# Patient Record
Sex: Male | Born: 1955 | Race: White | Hispanic: No | Marital: Married | Smoking: Former smoker
Health system: Southern US, Community
[De-identification: ages and names within clinical notes are randomized; demographics above are authoritative.]

## PROBLEM LIST (undated history)

## (undated) DIAGNOSIS — F329 Major depressive disorder, single episode, unspecified: Secondary | ICD-10-CM

## (undated) DIAGNOSIS — E785 Hyperlipidemia, unspecified: Secondary | ICD-10-CM

## (undated) DIAGNOSIS — I82403 Acute embolism and thrombosis of unspecified deep veins of lower extremity, bilateral: Secondary | ICD-10-CM

## (undated) DIAGNOSIS — C8330 Diffuse large B-cell lymphoma, unspecified site: Secondary | ICD-10-CM

## (undated) DIAGNOSIS — I10 Essential (primary) hypertension: Secondary | ICD-10-CM

## (undated) DIAGNOSIS — F419 Anxiety disorder, unspecified: Secondary | ICD-10-CM

## (undated) DIAGNOSIS — F32A Depression, unspecified: Secondary | ICD-10-CM

## (undated) DIAGNOSIS — Z95828 Presence of other vascular implants and grafts: Secondary | ICD-10-CM

---
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
## 1999-03-03 ENCOUNTER — Emergency Department (HOSPITAL_COMMUNITY): Admission: EM | Admit: 1999-03-03 | Discharge: 1999-03-03 | Payer: Self-pay | Admitting: Emergency Medicine

## 1999-03-05 ENCOUNTER — Emergency Department (HOSPITAL_COMMUNITY): Admission: EM | Admit: 1999-03-05 | Discharge: 1999-03-05 | Payer: Self-pay | Admitting: Emergency Medicine

## 1999-03-13 ENCOUNTER — Emergency Department (HOSPITAL_COMMUNITY): Admission: EM | Admit: 1999-03-13 | Discharge: 1999-03-13 | Payer: Self-pay | Admitting: Emergency Medicine

## 2004-05-25 ENCOUNTER — Ambulatory Visit: Payer: Self-pay | Admitting: Family Medicine

## 2004-07-16 ENCOUNTER — Ambulatory Visit: Payer: Self-pay | Admitting: Family Medicine

## 2004-08-27 ENCOUNTER — Ambulatory Visit: Payer: Self-pay | Admitting: Family Medicine

## 2004-12-03 ENCOUNTER — Ambulatory Visit: Payer: Self-pay | Admitting: Family Medicine

## 2005-04-29 ENCOUNTER — Ambulatory Visit: Payer: Self-pay | Admitting: Family Medicine

## 2005-09-02 ENCOUNTER — Ambulatory Visit: Payer: Self-pay | Admitting: Family Medicine

## 2006-01-06 ENCOUNTER — Ambulatory Visit: Payer: Self-pay | Admitting: Family Medicine

## 2006-06-18 ENCOUNTER — Ambulatory Visit: Payer: Self-pay | Admitting: Family Medicine

## 2006-10-21 ENCOUNTER — Ambulatory Visit: Payer: Self-pay | Admitting: Family Medicine

## 2014-02-01 DIAGNOSIS — E785 Hyperlipidemia, unspecified: Secondary | ICD-10-CM

## 2014-02-01 DIAGNOSIS — E781 Pure hyperglyceridemia: Secondary | ICD-10-CM | POA: Insufficient documentation

## 2014-02-01 HISTORY — DX: Hyperlipidemia, unspecified: E78.5

## 2014-02-28 ENCOUNTER — Telehealth: Payer: Self-pay

## 2014-02-28 NOTE — Telephone Encounter (Signed)
Pt was referred by Dr. Edrick Oh for screening colonoscopy. Letter was mailed to him on 02/02/2014. PCP called on 02/22/2014 and ask the status of referral and was told by Ginger that we had mailed him a letter and he had not responded. I just tried to call pt and could not get through to him on the phone.  I am mailing another letter to him and also sending a letter to PCP.

## 2015-02-08 ENCOUNTER — Encounter (INDEPENDENT_AMBULATORY_CARE_PROVIDER_SITE_OTHER): Payer: Self-pay | Admitting: *Deleted

## 2016-02-12 ENCOUNTER — Encounter (INDEPENDENT_AMBULATORY_CARE_PROVIDER_SITE_OTHER): Payer: Self-pay | Admitting: *Deleted

## 2016-05-21 DIAGNOSIS — L02229 Furuncle of trunk, unspecified: Secondary | ICD-10-CM | POA: Insufficient documentation

## 2017-02-03 LAB — HM HEPATITIS C SCREENING LAB: HM Hepatitis Screen: NEGATIVE

## 2017-02-14 ENCOUNTER — Encounter (INDEPENDENT_AMBULATORY_CARE_PROVIDER_SITE_OTHER): Payer: Self-pay | Admitting: *Deleted

## 2017-08-27 DIAGNOSIS — I10 Essential (primary) hypertension: Secondary | ICD-10-CM | POA: Diagnosis not present

## 2017-08-27 DIAGNOSIS — E785 Hyperlipidemia, unspecified: Secondary | ICD-10-CM | POA: Diagnosis not present

## 2017-08-27 DIAGNOSIS — Z1211 Encounter for screening for malignant neoplasm of colon: Secondary | ICD-10-CM | POA: Diagnosis not present

## 2017-08-27 DIAGNOSIS — Z23 Encounter for immunization: Secondary | ICD-10-CM | POA: Diagnosis not present

## 2018-02-12 DIAGNOSIS — Z125 Encounter for screening for malignant neoplasm of prostate: Secondary | ICD-10-CM | POA: Diagnosis not present

## 2018-02-12 DIAGNOSIS — E785 Hyperlipidemia, unspecified: Secondary | ICD-10-CM | POA: Diagnosis not present

## 2018-02-12 DIAGNOSIS — I10 Essential (primary) hypertension: Secondary | ICD-10-CM | POA: Diagnosis not present

## 2018-02-16 DIAGNOSIS — J209 Acute bronchitis, unspecified: Secondary | ICD-10-CM | POA: Diagnosis not present

## 2018-02-16 DIAGNOSIS — I10 Essential (primary) hypertension: Secondary | ICD-10-CM | POA: Diagnosis not present

## 2018-02-16 DIAGNOSIS — Z1211 Encounter for screening for malignant neoplasm of colon: Secondary | ICD-10-CM | POA: Diagnosis not present

## 2018-02-16 DIAGNOSIS — Z0000 Encounter for general adult medical examination without abnormal findings: Secondary | ICD-10-CM | POA: Diagnosis not present

## 2018-02-17 ENCOUNTER — Encounter (INDEPENDENT_AMBULATORY_CARE_PROVIDER_SITE_OTHER): Payer: Self-pay | Admitting: *Deleted

## 2019-04-17 ENCOUNTER — Emergency Department (HOSPITAL_COMMUNITY): Payer: BLUE CROSS/BLUE SHIELD

## 2019-04-17 ENCOUNTER — Other Ambulatory Visit: Payer: Self-pay

## 2019-04-17 ENCOUNTER — Encounter (HOSPITAL_COMMUNITY): Payer: Self-pay

## 2019-04-17 ENCOUNTER — Inpatient Hospital Stay (HOSPITAL_COMMUNITY): Payer: BLUE CROSS/BLUE SHIELD

## 2019-04-17 ENCOUNTER — Inpatient Hospital Stay (HOSPITAL_COMMUNITY)
Admission: EM | Admit: 2019-04-17 | Discharge: 2019-04-22 | DRG: 987 | Disposition: A | Discharge status: Home / Self Care | Payer: BLUE CROSS/BLUE SHIELD | Attending: Internal Medicine | Admitting: Internal Medicine

## 2019-04-17 DIAGNOSIS — N131 Hydronephrosis with ureteral stricture, not elsewhere classified: Secondary | ICD-10-CM | POA: Diagnosis present

## 2019-04-17 DIAGNOSIS — Z20828 Contact with and (suspected) exposure to other viral communicable diseases: Secondary | ICD-10-CM | POA: Diagnosis not present

## 2019-04-17 DIAGNOSIS — Z8042 Family history of malignant neoplasm of prostate: Secondary | ICD-10-CM | POA: Diagnosis not present

## 2019-04-17 DIAGNOSIS — R0902 Hypoxemia: Secondary | ICD-10-CM | POA: Diagnosis not present

## 2019-04-17 DIAGNOSIS — I82431 Acute embolism and thrombosis of right popliteal vein: Secondary | ICD-10-CM | POA: Diagnosis present

## 2019-04-17 DIAGNOSIS — I129 Hypertensive chronic kidney disease with stage 1 through stage 4 chronic kidney disease, or unspecified chronic kidney disease: Secondary | ICD-10-CM | POA: Diagnosis present

## 2019-04-17 DIAGNOSIS — I509 Heart failure, unspecified: Secondary | ICD-10-CM

## 2019-04-17 DIAGNOSIS — N184 Chronic kidney disease, stage 4 (severe): Secondary | ICD-10-CM | POA: Diagnosis not present

## 2019-04-17 DIAGNOSIS — I82453 Acute embolism and thrombosis of peroneal vein, bilateral: Secondary | ICD-10-CM | POA: Diagnosis present

## 2019-04-17 DIAGNOSIS — Z8249 Family history of ischemic heart disease and other diseases of the circulatory system: Secondary | ICD-10-CM

## 2019-04-17 DIAGNOSIS — I82443 Acute embolism and thrombosis of tibial vein, bilateral: Secondary | ICD-10-CM | POA: Diagnosis present

## 2019-04-17 DIAGNOSIS — C8335 Diffuse large B-cell lymphoma, lymph nodes of inguinal region and lower limb: Secondary | ICD-10-CM | POA: Diagnosis present

## 2019-04-17 DIAGNOSIS — Z79899 Other long term (current) drug therapy: Secondary | ICD-10-CM | POA: Diagnosis not present

## 2019-04-17 DIAGNOSIS — N179 Acute kidney failure, unspecified: Secondary | ICD-10-CM | POA: Diagnosis present

## 2019-04-17 DIAGNOSIS — I272 Pulmonary hypertension, unspecified: Secondary | ICD-10-CM | POA: Diagnosis present

## 2019-04-17 DIAGNOSIS — N1339 Other hydronephrosis: Secondary | ICD-10-CM | POA: Diagnosis present

## 2019-04-17 DIAGNOSIS — R590 Localized enlarged lymph nodes: Secondary | ICD-10-CM | POA: Diagnosis not present

## 2019-04-17 DIAGNOSIS — I82403 Acute embolism and thrombosis of unspecified deep veins of lower extremity, bilateral: Secondary | ICD-10-CM | POA: Diagnosis present

## 2019-04-17 DIAGNOSIS — I160 Hypertensive urgency: Secondary | ICD-10-CM

## 2019-04-17 DIAGNOSIS — I10 Essential (primary) hypertension: Secondary | ICD-10-CM | POA: Diagnosis present

## 2019-04-17 DIAGNOSIS — Z86718 Personal history of other venous thrombosis and embolism: Secondary | ICD-10-CM | POA: Diagnosis present

## 2019-04-17 DIAGNOSIS — N289 Disorder of kidney and ureter, unspecified: Secondary | ICD-10-CM | POA: Diagnosis not present

## 2019-04-17 DIAGNOSIS — Z87891 Personal history of nicotine dependence: Secondary | ICD-10-CM | POA: Diagnosis not present

## 2019-04-17 DIAGNOSIS — R05 Cough: Secondary | ICD-10-CM | POA: Diagnosis not present

## 2019-04-17 DIAGNOSIS — E7800 Pure hypercholesterolemia, unspecified: Secondary | ICD-10-CM | POA: Diagnosis not present

## 2019-04-17 DIAGNOSIS — I2699 Other pulmonary embolism without acute cor pulmonale: Secondary | ICD-10-CM | POA: Diagnosis not present

## 2019-04-17 DIAGNOSIS — R1903 Right lower quadrant abdominal swelling, mass and lump: Secondary | ICD-10-CM | POA: Diagnosis not present

## 2019-04-17 DIAGNOSIS — R319 Hematuria, unspecified: Secondary | ICD-10-CM | POA: Diagnosis not present

## 2019-04-17 DIAGNOSIS — J9601 Acute respiratory failure with hypoxia: Secondary | ICD-10-CM | POA: Diagnosis not present

## 2019-04-17 DIAGNOSIS — N1330 Unspecified hydronephrosis: Secondary | ICD-10-CM | POA: Diagnosis not present

## 2019-04-17 DIAGNOSIS — I110 Hypertensive heart disease with heart failure: Secondary | ICD-10-CM | POA: Diagnosis not present

## 2019-04-17 DIAGNOSIS — Z23 Encounter for immunization: Secondary | ICD-10-CM

## 2019-04-17 DIAGNOSIS — R591 Generalized enlarged lymph nodes: Secondary | ICD-10-CM | POA: Diagnosis not present

## 2019-04-17 DIAGNOSIS — I82411 Acute embolism and thrombosis of right femoral vein: Secondary | ICD-10-CM | POA: Diagnosis present

## 2019-04-17 DIAGNOSIS — R0602 Shortness of breath: Secondary | ICD-10-CM

## 2019-04-17 DIAGNOSIS — I82442 Acute embolism and thrombosis of left tibial vein: Secondary | ICD-10-CM | POA: Diagnosis not present

## 2019-04-17 DIAGNOSIS — Z806 Family history of leukemia: Secondary | ICD-10-CM | POA: Diagnosis not present

## 2019-04-17 DIAGNOSIS — R609 Edema, unspecified: Secondary | ICD-10-CM

## 2019-04-17 HISTORY — DX: Essential (primary) hypertension: I10

## 2019-04-17 LAB — COMPREHENSIVE METABOLIC PANEL
ALT: 14 U/L (ref 0–44)
AST: 18 U/L (ref 15–41)
Albumin: 3.7 g/dL (ref 3.5–5.0)
Alkaline Phosphatase: 89 U/L (ref 38–126)
Anion gap: 11 (ref 5–15)
BUN: 32 mg/dL — ABNORMAL HIGH (ref 8–23)
CO2: 19 mmol/L — ABNORMAL LOW (ref 22–32)
Calcium: 9.9 mg/dL (ref 8.9–10.3)
Chloride: 106 mmol/L (ref 98–111)
Creatinine, Ser: 2.19 mg/dL — ABNORMAL HIGH (ref 0.61–1.24)
GFR calc Af Amer: 36 mL/min — ABNORMAL LOW (ref >60)
GFR calc non Af Amer: 31 mL/min — ABNORMAL LOW (ref >60)
Glucose, Bld: 117 mg/dL — ABNORMAL HIGH (ref 70–99)
Potassium: 3.6 mmol/L (ref 3.5–5.1)
Sodium: 136 mmol/L (ref 135–145)
Total Bilirubin: 0.4 mg/dL (ref 0.3–1.2)
Total Protein: 7 g/dL (ref 6.5–8.1)

## 2019-04-17 LAB — PROCALCITONIN: Procalcitonin: <0.1 ng/mL

## 2019-04-17 LAB — SARS CORONAVIRUS 2 BY RT PCR (HOSPITAL ORDER, PERFORMED IN ~~LOC~~ HOSPITAL LAB): SARS Coronavirus 2: NEGATIVE

## 2019-04-17 LAB — URINALYSIS, ROUTINE W REFLEX MICROSCOPIC
Bacteria, UA: NONE SEEN
Bilirubin Urine: NEGATIVE
Glucose, UA: NEGATIVE mg/dL
Ketones, ur: NEGATIVE mg/dL
Leukocytes,Ua: NEGATIVE
Nitrite: NEGATIVE
Protein, ur: 30 mg/dL — AB
RBC / HPF: >50 RBC/hpf — ABNORMAL HIGH (ref 0–5)
Specific Gravity, Urine: 1.012 (ref 1.005–1.030)
pH: 5 (ref 5.0–8.0)

## 2019-04-17 LAB — TRIGLYCERIDES: Triglycerides: 181 mg/dL — ABNORMAL HIGH (ref <150)

## 2019-04-17 LAB — LACTIC ACID, PLASMA
Lactic Acid, Venous: 1.7 mmol/L (ref 0.5–1.9)
Lactic Acid, Venous: 1.5 mmol/L (ref 0.5–1.9)

## 2019-04-17 LAB — CBC WITH DIFFERENTIAL/PLATELET
Abs Immature Granulocytes: 0.1 10*3/uL — ABNORMAL HIGH (ref 0.00–0.07)
Basophils Absolute: 0 10*3/uL (ref 0.0–0.1)
Basophils Relative: 0 %
Eosinophils Absolute: 0.3 10*3/uL (ref 0.0–0.5)
Eosinophils Relative: 3 %
HCT: 42 % (ref 39.0–52.0)
Hemoglobin: 14.3 g/dL (ref 13.0–17.0)
Immature Granulocytes: 1 %
Lymphocytes Relative: 10 %
Lymphs Abs: 1.1 10*3/uL (ref 0.7–4.0)
MCH: 31.5 pg (ref 26.0–34.0)
MCHC: 34 g/dL (ref 30.0–36.0)
MCV: 92.5 fL (ref 80.0–100.0)
Monocytes Absolute: 1.3 10*3/uL — ABNORMAL HIGH (ref 0.1–1.0)
Monocytes Relative: 13 %
Neutro Abs: 7.7 10*3/uL (ref 1.7–7.7)
Neutrophils Relative %: 73 %
Platelets: 134 10*3/uL — ABNORMAL LOW (ref 150–400)
RBC: 4.54 MIL/uL (ref 4.22–5.81)
RDW: 12.4 % (ref 11.5–15.5)
WBC: 10.5 10*3/uL (ref 4.0–10.5)
nRBC: 0 % (ref 0.0–0.2)

## 2019-04-17 LAB — BRAIN NATRIURETIC PEPTIDE: B Natriuretic Peptide: 220 pg/mL — ABNORMAL HIGH (ref 0.0–100.0)

## 2019-04-17 LAB — C-REACTIVE PROTEIN: CRP: 10 mg/dL — ABNORMAL HIGH (ref <1.0)

## 2019-04-17 LAB — D-DIMER, QUANTITATIVE: D-Dimer, Quant: >20 ug/mL-FEU — ABNORMAL HIGH (ref 0.00–0.50)

## 2019-04-17 LAB — TROPONIN I (HIGH SENSITIVITY)
Troponin I (High Sensitivity): 45 ng/L — ABNORMAL HIGH (ref <18)
Troponin I (High Sensitivity): 55 ng/L — ABNORMAL HIGH (ref <18)

## 2019-04-17 LAB — FERRITIN: Ferritin: 318 ng/mL (ref 24–336)

## 2019-04-17 LAB — HIV ANTIBODY (ROUTINE TESTING W REFLEX): HIV Screen 4th Generation wRfx: NONREACTIVE

## 2019-04-17 LAB — ECHOCARDIOGRAM COMPLETE
Height: 66 in
Weight: 3456.81 oz

## 2019-04-17 LAB — LACTATE DEHYDROGENASE: LDH: 202 U/L — ABNORMAL HIGH (ref 98–192)

## 2019-04-17 LAB — FIBRINOGEN: Fibrinogen: 257 mg/dL (ref 210–475)

## 2019-04-17 MED ORDER — FUROSEMIDE 10 MG/ML IJ SOLN
40.0000 mg | Freq: Once | INTRAMUSCULAR | Status: AC
  Administered 2019-04-17: 40 mg via INTRAVENOUS
  Filled 2019-04-17: qty 4

## 2019-04-17 MED ORDER — SODIUM CHLORIDE 0.9 % IV SOLN: 250.0000 mL | INTRAVENOUS | Status: DC | PRN

## 2019-04-17 MED ORDER — SODIUM CHLORIDE 0.9% FLUSH
3.0000 mL | Freq: Two times a day (BID) | INTRAVENOUS | Status: DC
  Administered 2019-04-17 – 2019-04-22 (×9): 3 mL via INTRAVENOUS

## 2019-04-17 MED ORDER — SODIUM CHLORIDE 0.9% FLUSH: 3.0000 mL | INTRAVENOUS | Status: DC | PRN

## 2019-04-17 MED ORDER — AMLODIPINE BESYLATE 5 MG PO TABS
10.0000 mg | ORAL_TABLET | Freq: Every day | ORAL | Status: DC
  Administered 2019-04-17 – 2019-04-22 (×5): 10 mg via ORAL
  Filled 2019-04-17 (×6): qty 2

## 2019-04-17 MED ORDER — ALBUTEROL SULFATE HFA 108 (90 BASE) MCG/ACT IN AERS
8.0000 | INHALATION_SPRAY | Freq: Once | RESPIRATORY_TRACT | Status: AC
  Administered 2019-04-17: 8 via RESPIRATORY_TRACT
  Filled 2019-04-17: qty 6.7

## 2019-04-17 MED ORDER — ONDANSETRON HCL 4 MG/2ML IJ SOLN: 4.0000 mg | Freq: Four times a day (QID) | INTRAMUSCULAR | Status: DC | PRN

## 2019-04-17 MED ORDER — METOPROLOL TARTRATE 25 MG PO TABS
25.0000 mg | ORAL_TABLET | Freq: Two times a day (BID) | ORAL | Status: DC
  Administered 2019-04-17 – 2019-04-22 (×10): 25 mg via ORAL
  Filled 2019-04-17 (×11): qty 1

## 2019-04-17 MED ORDER — ORAL CARE MOUTH RINSE
15.0000 mL | Freq: Two times a day (BID) | OROMUCOSAL | Status: DC
  Administered 2019-04-17 – 2019-04-22 (×10): 15 mL via OROMUCOSAL

## 2019-04-17 MED ORDER — HEPARIN (PORCINE) 25000 UT/250ML-% IV SOLN
1400.0000 [IU]/h | INTRAVENOUS | Status: DC
  Administered 2019-04-17: 1400 [IU]/h via INTRAVENOUS
  Filled 2019-04-17: qty 250

## 2019-04-17 MED ORDER — NITROGLYCERIN 2 % TD OINT
0.5000 [in_us] | TOPICAL_OINTMENT | Freq: Once | TRANSDERMAL | Status: AC
  Administered 2019-04-17: 0.5 [in_us] via TOPICAL
  Filled 2019-04-17: qty 1

## 2019-04-17 MED ORDER — ONDANSETRON HCL 4 MG PO TABS: 4.0000 mg | ORAL_TABLET | Freq: Four times a day (QID) | ORAL | Status: DC | PRN

## 2019-04-17 MED ORDER — TECHNETIUM TO 99M ALBUMIN AGGREGATED
1.5000 | Freq: Once | INTRAVENOUS | Status: AC | PRN
  Administered 2019-04-17: 1.52 via INTRAVENOUS

## 2019-04-17 MED ORDER — HYDRALAZINE HCL 25 MG PO TABS
50.0000 mg | ORAL_TABLET | Freq: Four times a day (QID) | ORAL | Status: DC | PRN
  Administered 2019-04-18: 50 mg via ORAL
  Filled 2019-04-17: qty 2

## 2019-04-17 MED ORDER — HEPARIN BOLUS VIA INFUSION
4000.0000 [IU] | Freq: Once | INTRAVENOUS | Status: AC
  Administered 2019-04-17: 4000 [IU] via INTRAVENOUS
  Filled 2019-04-17: qty 4000

## 2019-04-17 MED ORDER — HEPARIN SODIUM (PORCINE) 5000 UNIT/ML IJ SOLN
5000.0000 [IU] | Freq: Three times a day (TID) | INTRAMUSCULAR | Status: DC
  Administered 2019-04-17: 5000 [IU] via SUBCUTANEOUS
  Filled 2019-04-17: qty 1

## 2019-04-17 MED ORDER — INFLUENZA VAC SPLIT QUAD 0.5 ML IM SUSY
0.5000 mL | PREFILLED_SYRINGE | INTRAMUSCULAR | Status: AC
  Administered 2019-04-18: 0.5 mL via INTRAMUSCULAR
  Filled 2019-04-17: qty 0.5

## 2019-04-17 MED ORDER — ACETAMINOPHEN 325 MG PO TABS
650.0000 mg | ORAL_TABLET | Freq: Four times a day (QID) | ORAL | Status: DC | PRN
  Administered 2019-04-18: 650 mg via ORAL
  Filled 2019-04-17: qty 2

## 2019-04-17 MED ORDER — ACETAMINOPHEN 650 MG RE SUPP: 650.0000 mg | Freq: Four times a day (QID) | RECTAL | Status: DC | PRN

## 2019-04-17 MED ORDER — TECHNETIUM TC 99M DIETHYLENETRIAME-PENTAACETIC ACID
40.0000 | Freq: Once | INTRAVENOUS | Status: AC | PRN
  Administered 2019-04-17: 41 via RESPIRATORY_TRACT

## 2019-04-17 NOTE — ED Notes (Signed)
Pt 76% on RA. Pt placed on 3L [place] and O2 now 93%.

## 2019-04-17 NOTE — ED Notes (Signed)
Patient ambulated to restroom.

## 2019-04-17 NOTE — ED Notes (Signed)
Patient returned from restroom with no oxygen at this time 02 sat 77 on room air. Dr Tomi Bamberger in room. Patient back on 02 sat at 93.

## 2019-04-17 NOTE — H&P (Signed)
TRH H&P    Patient Demographics:    Kevin [name], is a 63 y.o. male  MRN: [id number]  DOB - 01/20/1956  Admit Date - 04/17/2019  Referring MD/NP/PA: Dr. Tomi Bamberger  Outpatient Primary MD for the patient is Dione Housekeeper, MD  Patient coming from: Home  Chief complaint-shortness of breath   HPI:    Kevin [name]  is a 63 y.o. male, with history of hypertension, hypercholesterolemia who came to hospital with worsening shortness of breath of 1 day duration.  Patient says that this morning he had some shortness of breath which got better and at 2 AM it became worse.  Patient says that he has hypertension and has been prescribed 3 medications, of which he is has been only taking 2 medications as he ran out of third 1 2 months ago.  He denies history of CHF, CAD.  No history of stroke or seizures. He denies fever or chills. Denies coughing up yellow phlegm. In the ED patient's initial blood pressure was 201/76, O2 sats was 82% on room air.  On 3 L of oxygen pulse ox was 96%. Chest x-ray was suspicious for multifocal pneumonia, however procalcitonin less than 0.10 BNP 220, D-dimer elevated greater than 20.0, creatinine 2.12, BUN 32.  Patient's baseline creatinine is around 0.68 as of January 2020. SARS-CoV-2 is negative Lasix 40 mg IV given in the ED.   Review of systems:    In addition to the HPI above,    All other systems reviewed and are negative.    Past History of the following :    History reviewed. No pertinent past medical history.    History reviewed. No pertinent surgical history.    Social History:      Social History   Tobacco Use  . Smoking status: Never Smoker  . Smokeless tobacco: Never Used  Substance Use Topics  . Alcohol use: Yes       Family History :   Patient's mother had CAD   Home Medications:   Prior to Admission medications   Not on File     Allergies:    No Known Allergies   Physical Exam:   Vitals  Blood pressure (!) 171/71, pulse 94, temperature 98 F (36.7 C), temperature source Oral, resp. rate (!) 23, height 5\' 6"  (1.676 m), weight 95.3 kg, SpO2 (!) 77 %.  1.  General: Appears in no acute distress  2. Psychiatric: Alert, oriented x3, intact insight and judgment  3. Neurologic: Cranial nerves II through XII grossly intact, motor strength 5/5 in all extremities  4. HEENMT:  Atraumatic normocephalic, extraocular muscles are intact  5. Respiratory : Decreased breath sounds at lung bases  6. Cardiovascular : S1-S2, regular, no murmur auscultated, trace edema in the lower extremities  7. Gastrointestinal:  Abdomen is soft, nontender, no organomegaly      Data Review:    CBC Recent Labs  Lab 04/17/19 0349  WBC 10.5  HGB 14.3  HCT 42.0  PLT 134*  MCV 92.5  MCH 31.5  MCHC 34.0  RDW 12.4  LYMPHSABS 1.1  MONOABS 1.3*  EOSABS 0.3  BASOSABS 0.0   ------------------------------------------------------------------------------------------------------------------  Results for orders placed or performed during the hospital encounter of 04/17/19 (from the past 48 hour(s))  SARS Coronavirus 2 by RT PCR (hospital order, performed in [facility] hospital lab) Nasopharyngeal Nasopharyngeal Swab     Status: None   Collection Time: 04/17/19  3:49 AM   Specimen: Nasopharyngeal Swab  Result Value Ref Range   SARS Coronavirus 2 NEGATIVE NEGATIVE    Comment: (NOTE) If result is NEGATIVE SARS-CoV-2 target nucleic acids are NOT DETECTED. The SARS-CoV-2 RNA is generally detectable in upper and lower  respiratory specimens during the acute phase of infection. The lowest  concentration of SARS-CoV-2 viral copies this assay can detect is 250  copies / mL. A negative result does not preclude SARS-CoV-2 infection  and should not be used as the sole basis for treatment or other  patient management decisions.  A negative result may  occur with  improper specimen collection / handling, submission of specimen other  than nasopharyngeal swab, presence of viral mutation(s) within the  areas targeted by this assay, and inadequate number of viral copies  (<250 copies / mL). A negative result must be combined with clinical  observations, patient history, and epidemiological information. If result is POSITIVE SARS-CoV-2 target nucleic acids are DETECTED. The SARS-CoV-2 RNA is generally detectable in upper and lower  respiratory specimens dur ing the acute phase of infection.  Positive  results are indicative of active infection with SARS-CoV-2.  Clinical  correlation with patient history and other diagnostic information is  necessary to determine patient infection status.  Positive results do  not rule out bacterial infection or co-infection with other viruses. If result is PRESUMPTIVE POSTIVE SARS-CoV-2 nucleic acids MAY BE PRESENT.   A presumptive positive result was obtained on the submitted specimen  and confirmed on repeat testing.  While 2019 novel coronavirus  (SARS-CoV-2) nucleic acids may be present in the submitted sample  additional confirmatory testing may be necessary for epidemiological  and / or clinical management purposes  to differentiate between  SARS-CoV-2 and other Sarbecovirus currently known to infect humans.  If clinically indicated additional testing with an alternate test  methodology [id number]) is advised. The SARS-CoV-2 RNA is generally  detectable in upper and lower respiratory sp ecimens during the acute  phase of infection. The expected result is Negative. Fact Sheet for Patients:  StrictlyIdeas.no Fact Sheet for Healthcare Providers: BankingDealers.co.za This test is not yet approved or cleared by the [place] FDA and has been authorized for detection and/or diagnosis of SARS-CoV-2 by FDA under an Emergency Use Authorization (EUA).  This  EUA will remain in effect (meaning this test can be used) for the duration of the COVID-19 declaration under Section 564(b)(1) of the Act, 21 U.S.C. section 360bbb-3(b)(1), unless the authorization is terminated or revoked sooner. Performed at [facility], [address] [place], [place] [address]   CBC WITH DIFFERENTIAL     Status: Abnormal   Collection Time: 04/17/19  3:49 AM  Result Value Ref Range   WBC 10.5 4.0 - 10.5 K/uL   RBC 4.54 4.22 - 5.81 MIL/uL   Hemoglobin 14.3 13.0 - 17.0 g/dL   HCT 42.0 39.0 - 52.0 %   MCV 92.5 80.0 - 100.0 fL   MCH 31.5 26.0 - 34.0 pg   MCHC 34.0 30.0 - 36.0 g/dL   RDW 12.4 11.5 - 15.5 %   Platelets 134 (L)  150 - 400 K/uL   nRBC 0.0 0.0 - 0.2 %   Neutrophils Relative % 73 %   Neutro Abs 7.7 1.7 - 7.7 K/uL   Lymphocytes Relative 10 %   Lymphs Abs 1.1 0.7 - 4.0 K/uL   Monocytes Relative 13 %   Monocytes Absolute 1.3 (H) 0.1 - 1.0 K/uL   Eosinophils Relative 3 %   Eosinophils Absolute 0.3 0.0 - 0.5 K/uL   Basophils Relative 0 %   Basophils Absolute 0.0 0.0 - 0.1 K/uL   Immature Granulocytes 1 %   Abs Immature Granulocytes 0.10 (H) 0.00 - 0.07 K/uL    Comment: Performed at [facility], [address] [place], [place] [address]  Comprehensive metabolic panel     Status: Abnormal   Collection Time: 04/17/19  3:49 AM  Result Value Ref Range   Sodium 136 135 - 145 mmol/L   Potassium 3.6 3.5 - 5.1 mmol/L   Chloride 106 98 - 111 mmol/L   CO2 19 (L) 22 - 32 mmol/L   Glucose, Bld 117 (H) 70 - 99 mg/dL   BUN 32 (H) 8 - 23 mg/dL   Creatinine, Ser 2.19 (H) 0.61 - 1.24 mg/dL   Calcium 9.9 8.9 - 10.3 mg/dL   Total Protein 7.0 6.5 - 8.1 g/dL   Albumin 3.7 3.5 - 5.0 g/dL   AST 18 15 - 41 U/L   ALT 14 0 - 44 U/L   Alkaline Phosphatase 89 38 - 126 U/L   Total Bilirubin 0.4 0.3 - 1.2 mg/dL   GFR calc non Af Amer 31 (L) >60 mL/min   GFR calc Af Amer 36 (L) >60 mL/min   Anion gap 11 5 - 15    Comment: Performed at [facility], [address],  [place], [place] [address]  D-dimer, quantitative     Status: Abnormal   Collection Time: 04/17/19  3:49 AM  Result Value Ref Range   D-Dimer, Quant >20.00 (H) 0.00 - 0.50 ug/mL-FEU    Comment: (NOTE) At the manufacturer cut-off of 0.50 ug/mL FEU, this assay has been documented to exclude PE with a sensitivity and negative predictive value of 97 to 99%.  At this time, this assay has not been approved by the FDA to exclude DVT/VTE. Results should be correlated with clinical presentation. Performed at [facility], [address] [place], [place] [address]   Procalcitonin     Status: None   Collection Time: 04/17/19  3:49 AM  Result Value Ref Range   Procalcitonin <0.10 ng/mL    Comment:        Interpretation: PCT (Procalcitonin) <= 0.5 ng/mL: Systemic infection (sepsis) is not likely. Local bacterial infection is possible. (NOTE)       Sepsis PCT Algorithm           Lower Respiratory Tract                                      Infection PCT Algorithm    ----------------------------     ----------------------------         PCT < 0.25 ng/mL                PCT < 0.10 ng/mL         Strongly encourage             Strongly discourage   discontinuation of antibiotics    initiation of  antibiotics    ----------------------------     -----------------------------       PCT 0.25 - 0.50 ng/mL            PCT 0.10 - 0.25 ng/mL               OR       >80% decrease in PCT            Discourage initiation of                                            antibiotics      Encourage discontinuation           of antibiotics    ----------------------------     -----------------------------         PCT >= 0.50 ng/mL              PCT 0.26 - 0.50 ng/mL               AND        <80% decrease in PCT             Encourage initiation of                                             antibiotics       Encourage continuation           of antibiotics    ----------------------------     -----------------------------         PCT >= 0.50 ng/mL                  PCT > 0.50 ng/mL               AND         increase in PCT                  Strongly encourage                                      initiation of antibiotics    Strongly encourage escalation           of antibiotics                                     -----------------------------                                           PCT <= 0.25 ng/mL                                                 OR                                        >  80% decrease in PCT                                     Discontinue / Do not initiate                                             antibiotics Performed at [facility], [address], [place], [place] [address]   Lactate dehydrogenase     Status: Abnormal   Collection Time: 04/17/19  3:49 AM  Result Value Ref Range   LDH 202 (H) 98 - 192 U/L    Comment: Performed at [facility], [address], [place], [place] [address]  Ferritin     Status: None   Collection Time: 04/17/19  3:49 AM  Result Value Ref Range   Ferritin 318 24 - 336 ng/mL    Comment: Performed at [facility], [address] [place], [place] [address]  Triglycerides     Status: Abnormal   Collection Time: 04/17/19  3:49 AM  Result Value Ref Range   Triglycerides 181 (H) <150 mg/dL    Comment: Performed at [facility], [address] [place], [place] [address]  Fibrinogen     Status: None   Collection Time: 04/17/19  3:49 AM  Result Value Ref Range   Fibrinogen 257 210 - 475 mg/dL    Comment: Performed at [facility], [address], [place], [place] [address]  C-reactive protein     Status: Abnormal   Collection Time: 04/17/19  3:49 AM  Result Value Ref Range   CRP 10.0 (H) <1.0 mg/dL    Comment: Performed at [facility], [address] [place], [place] [address]  Brain natriuretic peptide     Status: Abnormal   Collection Time: 04/17/19  3:49 AM  Result Value Ref Range   B Natriuretic Peptide 220.0 (H) 0.0 - 100.0 pg/mL    Comment: Performed  at [facility], [address] [place], [place] [address]  Troponin I (High Sensitivity)     Status: Abnormal   Collection Time: 04/17/19  3:49 AM  Result Value Ref Range   Troponin I (High Sensitivity) 45 (H) <18 ng/L    Comment: (NOTE) Elevated high sensitivity troponin I (hsTnI) values and significant  changes across serial measurements may suggest ACS but many other  chronic and acute conditions are known to elevate hsTnI results.  Refer to the "Links" section for chest pain algorithms and additional  guidance. Performed at [facility], [address], [place], [place] [address]   Lactic acid, plasma     Status: None   Collection Time: 04/17/19  4:20 AM  Result Value Ref Range   Lactic Acid, Venous 1.7 0.5 - 1.9 mmol/L    Comment: Performed at [facility], [address], [place], [place] [address]  Blood Culture (routine x 2)     Status: None (Preliminary result)   Collection Time: 04/17/19  4:20 AM   Specimen: Right Antecubital; Blood  Result Value Ref Range   Specimen Description RIGHT ANTECUBITAL    Special Requests      BOTTLES DRAWN AEROBIC AND ANAEROBIC Blood Culture adequate volume Performed at [facility], [address], [place],  [address]    Culture PENDING    Report Status PENDING   Blood  Culture (routine x 2)     Status: None (Preliminary result)   Collection Time: 04/17/19  4:30 AM   Specimen: Left Antecubital; Blood  Result Value Ref Range   Specimen Description LEFT ANTECUBITAL    Special Requests      BOTTLES DRAWN AEROBIC AND ANAEROBIC Blood Culture adequate volume Performed at [facility], [address] [place], [place] [address]    Culture PENDING    Report Status PENDING     Chemistries  Recent Labs  Lab 04/17/19 0349  NA 136  K 3.6  CL 106  CO2 19*  GLUCOSE 117*  BUN 32*  CREATININE 2.19*  CALCIUM 9.9  AST 18  ALT 14  ALKPHOS 89  BILITOT 0.4    ------------------------------------------------------------------------------------------------------------------  ------------------------------------------------------------------------------------------------------------------ GFR: Estimated Creatinine Clearance: 36.8 mL/min (A) (by C-G formula based on SCr of 2.19 mg/dL (H)). Liver Function Tests: Recent Labs  Lab 04/17/19 0349  AST 18  ALT 14  ALKPHOS 89  BILITOT 0.4  PROT 7.0  ALBUMIN 3.7   No results for input(s): LIPASE, AMYLASE in the last 168 hours. No results for input(s): AMMONIA in the last 168 hours. Coagulation Profile: No results for input(s): INR, PROTIME in the last 168 hours. Cardiac Enzymes: No results for input(s): CKTOTAL, CKMB, CKMBINDEX, TROPONINI in the last 168 hours. BNP (last 3 results) No results for input(s): PROBNP in the last 8760 hours. HbA1C: No results for input(s): HGBA1C in the last 72 hours. CBG: No results for input(s): GLUCAP in the last 168 hours. Lipid Profile: Recent Labs    04/17/19 0349  TRIG 181*   Thyroid Function Tests: No results for input(s): TSH, T4TOTAL, FREET4, T3FREE, THYROIDAB in the last 72 hours. Anemia Panel: Recent Labs    04/17/19 0349  FERRITIN 318    --------------------------------------------------------------------------------------------------------------- Urine analysis: No results found for: COLORURINE, APPEARANCEUR, LABSPEC, PHURINE, GLUCOSEU, HGBUR, BILIRUBINUR, KETONESUR, PROTEINUR, UROBILINOGEN, NITRITE, LEUKOCYTESUR    Imaging Results:    Dg Chest Port 1 View  Result Date: 04/17/2019 CLINICAL DATA:  Shortness of breath beginning yesterday. Mild productive cough. No known fevers. EXAM: PORTABLE CHEST 1 VIEW COMPARISON:  None. FINDINGS: Borderline enlarged cardiac silhouette and mediastinal contours, potentially accentuated due to slightly reduced lung volumes and AP projection. Ill defined heterogeneous opacities are seen within the right  upper and left lower lungs. Mild pulmonary venous congestion without frank evidence of edema. No pleural effusion or pneumothorax. No acute osseous abnormalities. IMPRESSION: 1. Potential developing airspace opacities within the right upper and left lower lung, potentially atelectasis though could be seen in the setting of multifocal infection. Clinical correlation is advised. Further evaluation with a PA and lateral chest radiograph may be obtained as clinically indicated. 2. Pulmonary venous congestion without frank evidence of edema. Electronically Signed   By: Sandi Mariscal M.D.   On: 04/17/2019 04:22    My personal review of EKG: Rhythm NSR, no ST changes   Assessment & Plan:    Active Problems:   Hypertensive urgency   1. Hypertensive urgency-patient presenting with hypertensive urgency, dyspnea on exertion, likely has pulmonary edema on chest x-ray.  He is requiring 3 L/min of oxygen via nasal cannula.  BNP is 220.  We will give Lasix 40 mg IV x1.  Patient has creatinine of 2.12, if creatinine improves with Lasix consider continuing with Lasix.  Will obtain echocardiogram to rule out diastolic heart failure.  Troponin x2 is negative.  2. Elevated D-dimer-patient has significant elevated D-dimer greater than 20.0.  Cannot  obtain CTA chest due to renal insufficiency as above.  Will obtain VQ scan.  COVID-19 test is negative.  3. Hypertension-patient was taking metoprolol succinate 50 mg daily, amlodipine 10 mg daily, Cozaar 100 mg daily.  He ran out of one of the medications for past 2 months.  He does not member which medication he ran out.  We will continue with amlodipine 10 mg daily, metoprolol 25 mg p.o. twice daily.  4. Acute kidney injury-patient's creatinine 2.12, patient creatinine around 0.68.  Patient given 1 dose of Lasix 40 mg IV as above.  Follow creatinine closely.   DVT Prophylaxis-Heparin  AM Labs Ordered, also please review Full Orders  Family Communication: Admission,  patients condition and plan of care including tests being ordered have been discussed with the patient  who indicate understanding and agree with the plan and Code Status.  Code Status: Full code  Admission status: Inpatient: Based on patients clinical presentation and evaluation of above clinical data, I have made determination that patient meets Inpatient criteria at this time.  Time spent in minutes : 60 minutes   Oswald Hillock M.D on 04/17/2019 at 6:02 AM

## 2019-04-17 NOTE — Progress Notes (Signed)
ANTICOAGULATION CONSULT NOTE - Initial Consult  Pharmacy Consult for Heparin Indication: VTE treatment  No Known Allergies  Patient Measurements: Height: 5\' 6"  (167.6 cm) Weight: 216 lb 0.8 oz (98 kg) IBW/kg (Calculated) : 63.8 HEPARIN DW (KG): 85.2  Vital Signs: Temp: 99.2 F (37.3 C) (10/17 0804) Temp Source: Oral (10/17 0804) BP: 164/80 (10/17 1119) Pulse Rate: 73 (10/17 1119)  Labs: Recent Labs    04/17/19 0349 04/17/19 0544  HGB 14.3  --   HCT 42.0  --   PLT 134*  --   CREATININE 2.19*  --   TROPONINIHS 45* 55*    Estimated Creatinine Clearance: 37.4 mL/min (A) (by C-G formula based on SCr of 2.19 mg/dL (H)).   Medical History: Past Medical History:  Diagnosis Date  . Hypertension     Medications:  Medications Prior to Admission  Medication Sig Dispense Refill Last Dose  . amLODipine (NORVASC) 10 MG tablet Take 10 mg by mouth daily.   04/16/2019 at Unknown time  . metoprolol succinate (TOPROL-XL) 50 MG 24 hr tablet Take 50 mg by mouth daily.   04/16/2019 at 0800    Assessment: Patient presented with SOB. Patient has significant elevated D-dimer . VQ scan negative for PE. MD concerned for VTE and asked to start heparin.  Goal of Therapy:  Heparin level 0.3-0.7 units/ml Monitor platelets by anticoagulation protocol: Yes   Plan:  Give 4000 units bolus x 1 Start heparin infusion at 1400 units/hr Check anti-Xa level in ~6-8 hours and daily while on heparin Continue to monitor H&H and platelets  Isac Sarna, BS Vena [place], BCPS Clinical Pharmacist Pager [phone] 04/17/2019,4:55 PM

## 2019-04-17 NOTE — Progress Notes (Signed)
*  PRELIMINARY RESULTS* Echocardiogram 2D Echocardiogram has been performed.  Leavy Cella 04/17/2019, 10:05 AM

## 2019-04-17 NOTE — ED Triage Notes (Signed)
Pt reports onset of sob yesterday, denies pain, reports mild productive cough, no known fevers.

## 2019-04-17 NOTE — ED Provider Notes (Signed)
[facility] Provider Note   CSN: [id number] Arrival date & time: 04/17/19  6144   Time seen 4:00 AM  History   Chief Complaint Chief Complaint  Patient presents with  . Shortness of Breath    HPI Kevin [name] is a 63 y.o. male.     HPI patient states yesterday, October 16 in the morning he had some trouble breathing and then it got better.  He states about 2 AM this morning it returned.  He has had a mild cough, but he denies chest pain, fever, sore throat, rhinorrhea.  He still has his taste and smell intact.  He states he has some swelling of his legs is chronic.  He states he has not smoked since 2000 but he used to smoke 2 to 3 packs/day.  He denies any family history of coronary artery disease.  He is being treated with hypertension on 3 different blood pressure pills but he ran out of one a couple months ago.  He does not know the name of any of his medications.  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  He denies any exposure to anybody who is sick. PCP Dione Housekeeper, MD   History reviewed. No pertinent past medical history.  There are no active problems to display for this patient.   History reviewed. No pertinent surgical history.      Home Medications    Review of care everywhere shows patient was on amlodipine 10 mg tablets daily, losartan 100 mg tablets daily, and metoprolol 50 mg XL daily.  Prior to Admission medications   Not on File    Family History No family history on file.  Social History Social History   Tobacco Use  . Smoking status: Never Smoker  . Smokeless tobacco: Never Used  Substance Use Topics  . Alcohol use: Yes  . Drug use: Never  lives with spouse Quit smoking 2000  Allergies   Patient has no known allergies.   Review of Systems Review of Systems  All other systems reviewed and are negative.    Physical Exam  Updated Vital Signs BP (!) 201/76 (BP Location: Left Arm)   Pulse 82   Temp 98 F (36.7 C) (Oral)   Resp (!) 22   Ht 5\' 6"  (1.676 m)   Wt 95.3 kg   SpO2 (!) 82%   BMI 33.89 kg/m   Vital signs normal except for hypertension   Physical Exam Vitals signs and nursing note reviewed.  Constitutional:      General: He is not in acute distress.    Appearance: Normal appearance. He is well-developed. He is not ill-appearing or toxic-appearing.  HENT:     Head: Normocephalic and atraumatic.     Right Ear: External ear normal.     Left Ear: External ear normal.     Nose: Nose normal. No mucosal edema or rhinorrhea.     Mouth/Throat:     Mouth: Mucous membranes are dry.     Dentition: No dental abscesses.     Pharynx: Oropharynx is clear. No oropharyngeal exudate, posterior oropharyngeal erythema or uvula swelling.  Eyes:     Extraocular Movements: Extraocular movements intact.     Conjunctiva/sclera: Conjunctivae normal.     Pupils: Pupils are equal, round, and reactive to light.  Neck:     Musculoskeletal: Full passive range of motion without pain, normal  range of motion and neck supple.  Cardiovascular:     Rate and Rhythm: Normal rate and regular rhythm.     Heart sounds: Normal heart sounds. No murmur. No friction rub. No gallop.   Pulmonary:     Effort: Tachypnea and respiratory distress present.     Breath sounds: Decreased air movement present. Examination of the right-upper field reveals rales. Examination of the left-upper field reveals rales. Examination of the left-lower field reveals rales. Rales present. No wheezing or rhonchi.  Chest:     Chest wall: No tenderness or crepitus.  Abdominal:     General: Bowel sounds are normal. There is no distension.     Palpations: Abdomen is soft.     Tenderness: There is no abdominal tenderness. There is no guarding or rebound.  Musculoskeletal: Normal range of motion.        General: No tenderness.     Comments: Moves all  extremities well.   Skin:    General: Skin is warm and dry.     Coloration: Skin is not pale.     Findings: No erythema or rash.  Neurological:     General: No focal deficit present.     Mental Status: He is alert and oriented to person, place, and time.     Cranial Nerves: No cranial nerve deficit.  Psychiatric:        Mood and Affect: Mood normal. Mood is not anxious.        Speech: Speech normal.        Behavior: Behavior normal.        Thought Content: Thought content normal.      ED Treatments / Results  Labs (all labs ordered are listed, but only abnormal results are displayed) Results for orders placed or performed during the hospital encounter of 04/17/19  SARS Coronavirus 2 by RT PCR (hospital order, performed in [place] hospital lab) Nasopharyngeal Nasopharyngeal Swab   Specimen: Nasopharyngeal Swab  Result Value Ref Range   SARS Coronavirus 2 NEGATIVE NEGATIVE  Blood Culture (routine x 2)   Specimen: Right Antecubital; Blood  Result Value Ref Range   Specimen Description RIGHT ANTECUBITAL    Special Requests      BOTTLES DRAWN AEROBIC AND ANAEROBIC Blood Culture adequate volume Performed at [facility], [address], [place], [place] [address]    Culture PENDING    Report Status PENDING   Blood Culture (routine x 2)   Specimen: Left Antecubital; Blood  Result Value Ref Range   Specimen Description LEFT ANTECUBITAL    Special Requests      BOTTLES DRAWN AEROBIC AND ANAEROBIC Blood Culture adequate volume Performed at [facility], [address] [place], [place] [address]    Culture PENDING    Report Status PENDING   Lactic acid, plasma  Result Value Ref Range   Lactic Acid, Venous 1.7 0.5 - 1.9 mmol/L  CBC WITH DIFFERENTIAL  Result Value Ref Range   WBC 10.5 4.0 - 10.5 K/uL   RBC 4.54 4.22 - 5.81 MIL/uL   Hemoglobin 14.3 13.0 - 17.0 g/dL   HCT 42.0 39.0 - 52.0 %   MCV 92.5 80.0 - 100.0 fL   MCH 31.5 26.0 - 34.0 pg   MCHC 34.0 30.0 - 36.0 g/dL    RDW 12.4 11.5 - 15.5 %   Platelets 134 (L) 150 - 400 K/uL   nRBC 0.0 0.0 - 0.2 %   Neutrophils Relative % 73 %   Neutro Abs  7.7 1.7 - 7.7 K/uL   Lymphocytes Relative 10 %   Lymphs Abs 1.1 0.7 - 4.0 K/uL   Monocytes Relative 13 %   Monocytes Absolute 1.3 (H) 0.1 - 1.0 K/uL   Eosinophils Relative 3 %   Eosinophils Absolute 0.3 0.0 - 0.5 K/uL   Basophils Relative 0 %   Basophils Absolute 0.0 0.0 - 0.1 K/uL   Immature Granulocytes 1 %   Abs Immature Granulocytes 0.10 (H) 0.00 - 0.07 K/uL  Comprehensive metabolic panel  Result Value Ref Range   Sodium 136 135 - 145 mmol/L   Potassium 3.6 3.5 - 5.1 mmol/L   Chloride 106 98 - 111 mmol/L   CO2 19 (L) 22 - 32 mmol/L   Glucose, Bld 117 (H) 70 - 99 mg/dL   BUN 32 (H) 8 - 23 mg/dL   Creatinine, Ser 2.19 (H) 0.61 - 1.24 mg/dL   Calcium 9.9 8.9 - 10.3 mg/dL   Total Protein 7.0 6.5 - 8.1 g/dL   Albumin 3.7 3.5 - 5.0 g/dL   AST 18 15 - 41 U/L   ALT 14 0 - 44 U/L   Alkaline Phosphatase 89 38 - 126 U/L   Total Bilirubin 0.4 0.3 - 1.2 mg/dL   GFR calc non Af Amer 31 (L) >60 mL/min   GFR calc Af Amer 36 (L) >60 mL/min   Anion gap 11 5 - 15  D-dimer, quantitative  Result Value Ref Range   D-Dimer, Quant >20.00 (H) 0.00 - 0.50 ug/mL-FEU  Procalcitonin  Result Value Ref Range   Procalcitonin <0.10 ng/mL  Lactate dehydrogenase  Result Value Ref Range   LDH 202 (H) 98 - 192 U/L  Ferritin  Result Value Ref Range   Ferritin 318 24 - 336 ng/mL  Triglycerides  Result Value Ref Range   Triglycerides 181 (H) <150 mg/dL  Fibrinogen  Result Value Ref Range   Fibrinogen 257 210 - 475 mg/dL  C-reactive protein  Result Value Ref Range   CRP 10.0 (H) <1.0 mg/dL  Brain natriuretic peptide  Result Value Ref Range   B Natriuretic Peptide 220.0 (H) 0.0 - 100.0 pg/mL  Troponin I (High Sensitivity)  Result Value Ref Range   Troponin I (High Sensitivity) 45 (H) <18 ng/L   Laboratory interpretation all normal except new renal insufficiency, very  elevated D-dimer, elevation of triglycerides and LDH and CRP, minimal elevation of BNP and troponin, delta troponin pending.    EKG EKG Interpretation  Date/Time:  Saturday April 17 2019 03:55:08 EDT Ventricular Rate:  89 PR Interval:    QRS Duration: 97 QT Interval:  373 QTC Calculation: 850 R Axis:   12 Text Interpretation:  Sinus rhythm Prolonged PR interval Baseline wander Otherwise within normal limits No old tracing to compare Confirmed by Rolland Porter [phone]) on 04/17/2019 4:19:22 AM   Radiology Dg Chest Port 1 View  Result Date: 04/17/2019 CLINICAL DATA:  Shortness of breath beginning yesterday. Mild productive cough. No known fevers. EXAM: PORTABLE CHEST 1 VIEW COMPARISON:  None. FINDINGS: Borderline enlarged cardiac silhouette and mediastinal contours, potentially accentuated due to slightly reduced lung volumes and AP projection. Ill defined heterogeneous opacities are seen within the right upper and left lower lungs. Mild pulmonary venous congestion without frank evidence of edema. No pleural effusion or pneumothorax. No acute osseous abnormalities. IMPRESSION: 1. Potential developing airspace opacities within the right upper and left lower lung, potentially atelectasis though could be seen in the setting of multifocal infection. Clinical correlation is advised.  Further evaluation with a PA and lateral chest radiograph may be obtained as clinically indicated. 2. Pulmonary venous congestion without frank evidence of edema. Electronically Signed   By: Sandi Mariscal M.D.   On: 04/17/2019 04:22    Procedures .Critical Care Performed by: Rolland Porter, MD Authorized by: Rolland Porter, MD   Critical care provider statement:    Critical care time (minutes):  36   Critical care was necessary to treat or prevent imminent or life-threatening deterioration of the following conditions:  Respiratory failure   Critical care was time spent personally by me on the following activities:   Discussions with consultants, examination of patient, obtaining history from patient or surrogate, ordering and review of laboratory studies, ordering and review of radiographic studies, pulse oximetry, re-evaluation of patient's condition and review of old charts   (including critical care time)  Medications Ordered in ED Medications  furosemide (LASIX) injection 40 mg (has no administration in time range)  albuterol (VENTOLIN HFA) 108 (90 Base) MCG/ACT inhaler 8 puff (8 puffs Inhalation Given 04/17/19 0456)     Initial Impression / Assessment and Plan / ED Course  I have reviewed the triage vital signs and the nursing notes.  Pertinent labs & imaging results that were available during my care of the patient were reviewed by me and considered in my medical decision making (see chart for details).    Review of care everywhere shows patient was on amlodipine 10 mg tablets daily, losartan 100 mg tablets daily, and metoprolol 50 mg XL daily.    Patient's initial pulse ox on room air was 82%.  He was placed on nasal cannula 2 L/min and his pulse ox was 92% during my exam.  On 3 L his pulse ox was 96%.  Patient has scattered rhonchi throughout his lungs, extremely suspicious for pneumonia, less likely congestive heart failure.  Testing was done.  I reviewed patient's laboratory test results.  His D-dimer is very high however his creatinine and GFR exclude him from getting a CTA done.  When I review his laboratory testing from [place] in February his creatinine was normal at that time, at 0.68 with BUN of 11.  His creatinines over the past 3 years have all been in that range or the highest was 0.86.  5:35 AM Dr. Darrick Meigs will admit.  He plans on doing an echocardiogram and a VQ scan.  Patient's blood pressure has improved to 171/71 without specific treatment.  He wants me to give him low-dose Lasix and half inch of Nitropaste.  5:37 AM I went to see the patient, he was in the bathroom without  oxygen.  He ambulated back to his bed and states he was feeling a little bit better.  His initial pulse ox was 77% on room air.  He was placed back on his nasal cannula oxygen.  We discussed his test results and he understands the need for admission.  Kevin [name] was evaluated in Emergency Department on 04/17/2019 for the symptoms described in the history of present illness. He was evaluated in the context of the global COVID-19 pandemic, which necessitated consideration that the patient might be at risk for infection with the SARS-CoV-2 virus that causes COVID-19. Institutional protocols and algorithms that pertain to the evaluation of patients at risk for COVID-19 are in a state of rapid change based on information released by regulatory bodies including the CDC and federal and state organizations. These policies and algorithms were followed during the patient's care  in the ED.   Final Clinical Impressions(s) / ED Diagnoses   Final diagnoses:  Hypoxia  Hypertensive urgency  Acute congestive heart failure, unspecified heart failure type [facility])    Plan admission  Rolland Porter, MD, Barbette Or, MD 04/17/19 [phone]

## 2019-04-17 NOTE — Progress Notes (Signed)
Patient admitted to the hospital earlier this morning by Dr. Darrick Meigs.  Patient seen and examined.  Overall he feels that breathing has improved since admission.  On exam, he is clear lungs bilaterally, 1+ bilateral lower extremity edema.  Assessment/plan:  1. Acute respiratory failure with hypoxia.  Etiology is not entirely clear.  Still requiring supplemental oxygen at 2 L.  Initial chest x-ray showed possible pulmonary vascular congestion with areas of atelectasis/infiltrates indicating multifocal pneumonia.  Procalcitonin was negative making bacterial process less likely.  D-dimer markedly elevated greater than 20.  VQ scan performed today was found to be low probability for pulmonary embolus.  He does have a mildly elevated BNP in the 200 range.  Mildly elevated troponin in the 40s.  Echocardiogram performed today shows normal ejection fraction, but markedly elevated pulmonary pressures in the 70s.  Considering his clinical picture, with elevated d-dimer, pulmonary pressures, BNP/troponin, there is concern that he may have an underlying thromboembolism.  We will start the patient on empiric heparin.  CT angiogram of the chest can be considered if renal function improves.  Will request pulmonology input. 2. Pulmonary hypertension.  Unclear if this this is related to underlying VTE.  Will request pulmonology input. 3. Acute kidney injury.  Unclear etiology.  Baseline creatinine around 0.6 on 08/2018.  Creatinine on admission 2.1.  He did receive 1 dose of Lasix today.  Will hold off on prescribing further doses until we follow renal function trend.  Check urinalysis and renal ultrasound.  He was reportedly taking Cozaar as an outpatient which is currently being held. 4. Hypertensive urgency.  Continued on amlodipine and metoprolol.  Use hydralazine PRN  [employer]

## 2019-04-18 ENCOUNTER — Inpatient Hospital Stay (HOSPITAL_COMMUNITY): Payer: BLUE CROSS/BLUE SHIELD

## 2019-04-18 DIAGNOSIS — R1903 Right lower quadrant abdominal swelling, mass and lump: Secondary | ICD-10-CM | POA: Diagnosis present

## 2019-04-18 DIAGNOSIS — Z86718 Personal history of other venous thrombosis and embolism: Secondary | ICD-10-CM | POA: Diagnosis present

## 2019-04-18 DIAGNOSIS — I10 Essential (primary) hypertension: Secondary | ICD-10-CM

## 2019-04-18 DIAGNOSIS — N1339 Other hydronephrosis: Secondary | ICD-10-CM | POA: Diagnosis present

## 2019-04-18 DIAGNOSIS — I82403 Acute embolism and thrombosis of unspecified deep veins of lower extremity, bilateral: Secondary | ICD-10-CM | POA: Diagnosis present

## 2019-04-18 DIAGNOSIS — I272 Pulmonary hypertension, unspecified: Secondary | ICD-10-CM

## 2019-04-18 DIAGNOSIS — N1330 Unspecified hydronephrosis: Secondary | ICD-10-CM

## 2019-04-18 HISTORY — DX: Acute embolism and thrombosis of unspecified deep veins of lower extremity, bilateral: I82.403

## 2019-04-18 LAB — HEPARIN LEVEL (UNFRACTIONATED)
Heparin Unfractionated: 0.2 IU/mL — ABNORMAL LOW (ref 0.30–0.70)
Heparin Unfractionated: 0.41 IU/mL (ref 0.30–0.70)
Heparin Unfractionated: 0.35 IU/mL (ref 0.30–0.70)

## 2019-04-18 LAB — CBC
HCT: 35.9 % — ABNORMAL LOW (ref 39.0–52.0)
Hemoglobin: 12.2 g/dL — ABNORMAL LOW (ref 13.0–17.0)
MCH: 31.6 pg (ref 26.0–34.0)
MCHC: 34 g/dL (ref 30.0–36.0)
MCV: 93 fL (ref 80.0–100.0)
Platelets: 123 10*3/uL — ABNORMAL LOW (ref 150–400)
RBC: 3.86 MIL/uL — ABNORMAL LOW (ref 4.22–5.81)
RDW: 12.5 % (ref 11.5–15.5)
WBC: 8 10*3/uL (ref 4.0–10.5)
nRBC: 0 % (ref 0.0–0.2)

## 2019-04-18 LAB — COMPREHENSIVE METABOLIC PANEL
ALT: 13 U/L (ref 0–44)
AST: 14 U/L — ABNORMAL LOW (ref 15–41)
Albumin: 3.1 g/dL — ABNORMAL LOW (ref 3.5–5.0)
Alkaline Phosphatase: 67 U/L (ref 38–126)
Anion gap: 9 (ref 5–15)
BUN: 36 mg/dL — ABNORMAL HIGH (ref 8–23)
CO2: 23 mmol/L (ref 22–32)
Calcium: 8.9 mg/dL (ref 8.9–10.3)
Chloride: 108 mmol/L (ref 98–111)
Creatinine, Ser: 1.98 mg/dL — ABNORMAL HIGH (ref 0.61–1.24)
GFR calc Af Amer: 40 mL/min — ABNORMAL LOW (ref >60)
GFR calc non Af Amer: 35 mL/min — ABNORMAL LOW (ref >60)
Glucose, Bld: 108 mg/dL — ABNORMAL HIGH (ref 70–99)
Potassium: 4 mmol/L (ref 3.5–5.1)
Sodium: 140 mmol/L (ref 135–145)
Total Bilirubin: 0.6 mg/dL (ref 0.3–1.2)
Total Protein: 5.8 g/dL — ABNORMAL LOW (ref 6.5–8.1)

## 2019-04-18 LAB — D-DIMER, QUANTITATIVE: D-Dimer, Quant: 19.72 ug/mL-FEU — ABNORMAL HIGH (ref 0.00–0.50)

## 2019-04-18 LAB — C-REACTIVE PROTEIN: CRP: 12.6 mg/dL — ABNORMAL HIGH (ref <1.0)

## 2019-04-18 MED ORDER — IOHEXOL 300 MG/ML  SOLN
30.0000 mL | Freq: Once | INTRAMUSCULAR | Status: AC | PRN
  Administered 2019-04-18: 30 mL via ORAL

## 2019-04-18 MED ORDER — HEPARIN (PORCINE) 25000 UT/250ML-% IV SOLN
1750.0000 [IU]/h | INTRAVENOUS | Status: DC
  Administered 2019-04-18 – 2019-04-19 (×2): 1600 [IU]/h via INTRAVENOUS
  Administered 2019-04-19 – 2019-04-21 (×3): 1750 [IU]/h via INTRAVENOUS
  Filled 2019-04-18 (×5): qty 250

## 2019-04-18 MED ORDER — OXYCODONE-ACETAMINOPHEN 5-325 MG PO TABS
1.0000 | ORAL_TABLET | ORAL | Status: DC | PRN
  Administered 2019-04-18 – 2019-04-22 (×8): 2 via ORAL
  Filled 2019-04-18 (×8): qty 2

## 2019-04-18 NOTE — Progress Notes (Signed)
ANTICOAGULATION CONSULT NOTE - Follow-Up Consult  Pharmacy Consult for Heparin Indication: VTE treatment  No Known Allergies  Patient Measurements: Height: 5\' 6"  (167.6 cm) Weight: 216 lb 0.8 oz (98 kg) IBW/kg (Calculated) : 63.8 HEPARIN DW (KG): 85.2  Vital Signs: Temp: 98.8 F (37.1 C) (10/17 2204) Temp Source: Oral (10/17 2204) BP: 176/82 (10/17 2204) Pulse Rate: 77 (10/17 2204)  Labs: Recent Labs    04/17/19 0349 04/17/19 0544 04/18/19 0030  HGB 14.3  --   --   HCT 42.0  --   --   PLT 134*  --   --   HEPARINUNFRC  --   --  0.20*  CREATININE 2.19*  --   --   TROPONINIHS 45* 55*  --     Estimated Creatinine Clearance: 37.4 mL/min (A) (by C-G formula based on SCr of 2.19 mg/dL (H)).   Medical History: Past Medical History:  Diagnosis Date  . Hypertension     Medications:  Medications Prior to Admission  Medication Sig Dispense Refill Last Dose  . amLODipine (NORVASC) 10 MG tablet Take 10 mg by mouth daily.   04/16/2019 at Unknown time  . metoprolol succinate (TOPROL-XL) 50 MG 24 hr tablet Take 50 mg by mouth daily.   04/16/2019 at 0800    Assessment: Patient presented with SOB. Patient has significant elevated D-dimer . VQ scan negative for PE. MD concerned for VTE and asked to start heparin.  Today, 04/18/19  HL is 0.20, subtherpeutic  No line or bleeding issues per RN   Goal of Therapy:  Heparin level 0.3-0.7 units/ml Monitor platelets by anticoagulation protocol: Yes   Plan:  Increase heparin infusion to 1600 units/hr Check anti-Xa level in ~6-8 hours and daily while on heparin Continue to monitor H&H and platelets   Royetta Asal, PharmD, BCPS 04/18/2019 2:53 AM

## 2019-04-18 NOTE — Progress Notes (Signed)
ANTICOAGULATION CONSULT NOTE - Follow-Up Consult  Pharmacy Consult for Heparin Indication: VTE treatment  No Known Allergies  Patient Measurements: Height: 5\' 6"  (167.6 cm) Weight: 216 lb 0.8 oz (98 kg) IBW/kg (Calculated) : 63.8 HEPARIN DW (KG): 85.2  Vital Signs: Temp: 98.4 F (36.9 C) (10/18 0451) Temp Source: Oral (10/18 0451) BP: 163/81 (10/18 0736) Pulse Rate: 61 (10/18 0451)  Labs: Recent Labs    04/17/19 0349 04/17/19 0544 04/18/19 0030 04/18/19 0834  HGB 14.3  --   --  12.2*  HCT 42.0  --   --  35.9*  PLT 134*  --   --  123*  HEPARINUNFRC  --   --  0.20* 0.41  CREATININE 2.19*  --   --  1.98*  TROPONINIHS 45* 55*  --   --     Estimated Creatinine Clearance: 41.3 mL/min (A) (by C-G formula based on SCr of 1.98 mg/dL (H)).   Medical History: Past Medical History:  Diagnosis Date  . Hypertension     Medications:  Medications Prior to Admission  Medication Sig Dispense Refill Last Dose  . amLODipine (NORVASC) 10 MG tablet Take 10 mg by mouth daily.   04/16/2019 at Unknown time  . metoprolol succinate (TOPROL-XL) 50 MG 24 hr tablet Take 50 mg by mouth daily.   04/16/2019 at 0800    Assessment: Patient presented with SOB. Patient has significant elevated D-dimer . VQ scan negative for PE. MD concerned for VTE and asked to start heparin.   HL is 0.41,therpeutic  No line or bleeding issues per RN   Goal of Therapy:  Heparin level 0.3-0.7 units/ml Monitor platelets by anticoagulation protocol: Yes   Plan:  Continue heparin infusion at 1600 units/hr Check anti-Xa level in ~6-8 hours and daily while on heparin Continue to monitor H&H and platelets  Isac Sarna, BS [place], BCPS Clinical Pharmacist Pager [phone] 04/18/2019 9:31 AM

## 2019-04-18 NOTE — Progress Notes (Signed)
ANTICOAGULATION CONSULT NOTE - Follow-Up Consult  Pharmacy Consult for Heparin Indication: VTE treatment  No Known Allergies  Patient Measurements: Height: 5\' 6"  (167.6 cm) Weight: 216 lb 0.8 oz (98 kg) IBW/kg (Calculated) : 63.8 HEPARIN DW (KG): 85.2  Vital Signs: Temp: 98.6 F (37 C) (10/18 1430) Temp Source: Oral (10/18 1430) BP: 153/64 (10/18 1430) Pulse Rate: 58 (10/18 1430)  Labs: Recent Labs    04/17/19 0349 04/17/19 0544 04/18/19 0030 04/18/19 0834 04/18/19 1608  HGB 14.3  --   --  12.2*  --   HCT 42.0  --   --  35.9*  --   PLT 134*  --   --  123*  --   HEPARINUNFRC  --   --  0.20* 0.41 0.35  CREATININE 2.19*  --   --  1.98*  --   TROPONINIHS 45* 55*  --   --   --     Estimated Creatinine Clearance: 41.3 mL/min (A) (by C-G formula based on SCr of 1.98 mg/dL (H)).   Medical History: Past Medical History:  Diagnosis Date  . Hypertension     Medications:  Medications Prior to Admission  Medication Sig Dispense Refill Last Dose  . amLODipine (NORVASC) 10 MG tablet Take 10 mg by mouth daily.   04/16/2019 at Unknown time  . metoprolol succinate (TOPROL-XL) 50 MG 24 hr tablet Take 50 mg by mouth daily.   04/16/2019 at 0800    Assessment: Patient presented with SOB. Patient has significant elevated D-dimer . VQ scan negative for PE. MD concerned for VTE and asked to start heparin.   HL remains therpeutic  No line or bleeding issues per RN   Goal of Therapy:  Heparin level 0.3-0.7 units/ml Monitor platelets by anticoagulation protocol: Yes   Plan:  Continue heparin infusion at 1600 units/hr Check anti-Xa level  daily while on heparin Continue to monitor H&H and platelets  Isac Sarna, BS [place], BCPS Clinical Pharmacist Pager [phone] 04/18/2019 5:08 PM

## 2019-04-18 NOTE — Progress Notes (Signed)
PROGRESS NOTE    Kevin [name]  ZSW:[id number] DOB: Apr 18, 1956 DOA: 04/17/2019 PCP: Dione Housekeeper, MD    Brief Narrative:  63 year old male with a history of hypertension, remote history of tobacco use who quit 20 years ago, admitted to the hospital with sudden onset of shortness of breath of 1 day duration.  Initial imaging indicated possible pulmonary vascular congestion, mild elevation of BNP and troponin.  He was noted to have significant hypertension on arrival.  He received a dose of Lasix and was admitted for further treatment.  Echocardiogram showed significantly elevated pulmonary artery pressure in the 70s.  D-dimer was elevated greater than 20.  Venous Dopplers of lower extremities showed bilateral DVTs.  Although VQ scan with low probability for PE, and current clinical scenario was felt likely that he did not fact have a pulmonary embolus.  Pulmonology following.  He is currently on intravenous heparin.  Further work-up has shown a possible right lower quadrant abdominal mass.  Further work-up in process.   Assessment & Plan:   Active Problems:   Hypertensive urgency   Acute respiratory failure with hypoxia (HCC)   HTN (hypertension)   Pulmonary hypertension (HCC)   AKI (acute kidney injury) ([place])   Leg DVT (deep venous thromboembolism), acute, bilateral (HCC)   Abdominal mass, RLQ (right lower quadrant)   Hydronephrosis of right kidney   1. Acute respiratory failure with hypoxia.  In current clinical scenario, with underlying DVTs, sudden onset of symptoms, elevated pulmonary pressures, suspect that he has pulmonary embolus.  Continue on intravenous anticoagulation.  Try and wean off oxygen as tolerated.  Appreciate pulmonology assistance.  CT chest ordered to further evaluate lung findings.  Noted to have diffuse adenopathy. 2. Pulmonary hypertension.  Suspect this is related to pulmonary embolus. 3. Bilateral lower extremity DVTs.  On intravenous anticoagulation.   Suspect this may been precipitated by possible underlying malignancy. 4. Abdominal mass.  Noted on renal ultrasound.  With adenopathy noted in chest, concerning for underlying malignancy.  CT abdomen ordered for further characterization of mass. 5. Acute kidney injury.  Baseline creatinine 0.6 in 08/2018.  On admission, creatinine noted to be elevated at 2.1.  Is currently stable at 1.98 despite receiving a dose of Lasix yesterday.  Renal ultrasound indicates possible mild right-sided hydronephrosis, possibly related to underlying mass.  Check CT abdomen to rule out any obstructive stone. 6. Hypertensive urgency.  Continued on amlodipine and metoprolol.  Blood pressures currently stable.   DVT prophylaxis: Heparin infusion Code Status: Full code Family Communication: Discussed with wife at the bedside Disposition Plan: Discharge home once work-up is complete   Consultants:   Pulmonology  Procedures:   Echocardiogram  Antimicrobials:       Subjective: Feels that shortness of breath is improving, still requiring supplemental oxygen.  Had pain in his right flank earlier this morning.  Also complains of pain in his legs when he tries to walk.  Objective: Vitals:   04/18/19 0451 04/18/19 0736 04/18/19 0808 04/18/19 1430  BP: (!) 184/79 (!) 163/81  (!) 153/64  Pulse: 61   (!) 58  Resp: 18   16  Temp: 98.4 F (36.9 C)   98.6 F (37 C)  TempSrc: Oral   Oral  SpO2: 96%  97% 96%  Weight:      Height:        Intake/Output Summary (Last 24 hours) at 04/18/2019 1851 Last data filed at 04/18/2019 1500 Gross per 24 hour  Intake 337.73 ml  Output  200 ml  Net 137.73 ml   Filed Weights   04/17/19 0346 04/17/19 0804  Weight: 95.3 kg 98 kg    Examination:  General exam: Appears calm and comfortable  Respiratory system: Clear to auscultation. Respiratory effort normal. Cardiovascular system: S1 & S2 heard, RRR. No JVD, murmurs, rubs, gallops or clicks. No pedal  edema. Gastrointestinal system: Abdomen is nondistended, soft and nontender. No organomegaly or masses felt. Normal bowel sounds heard. Central nervous system: Alert and oriented. No focal neurological deficits. Extremities: Symmetric 5 x 5 power. Skin: No rashes, lesions or ulcers Psychiatry: Judgement and insight appear normal. Mood & affect appropriate.   Data Reviewed: I have personally reviewed following labs and imaging studies  CBC: Recent Labs  Lab 04/17/19 0349 04/18/19 0834  WBC 10.5 8.0  NEUTROABS 7.7  --   HGB 14.3 12.2*  HCT 42.0 35.9*  MCV 92.5 93.0  PLT 134* 093*   Basic Metabolic Panel: Recent Labs  Lab 04/17/19 0349 04/18/19 0834  NA 136 140  K 3.6 4.0  CL 106 108  CO2 19* 23  GLUCOSE 117* 108*  BUN 32* 36*  CREATININE 2.19* 1.98*  CALCIUM 9.9 8.9   GFR: Estimated Creatinine Clearance: 41.3 mL/min (A) (by C-G formula based on SCr of 1.98 mg/dL (H)). Liver Function Tests: Recent Labs  Lab 04/17/19 0349 04/18/19 0834  AST 18 14*  ALT 14 13  ALKPHOS 89 67  BILITOT 0.4 0.6  PROT 7.0 5.8*  ALBUMIN 3.7 3.1*   No results for input(s): LIPASE, AMYLASE in the last 168 hours. No results for input(s): AMMONIA in the last 168 hours. Coagulation Profile: No results for input(s): INR, PROTIME in the last 168 hours. Cardiac Enzymes: No results for input(s): CKTOTAL, CKMB, CKMBINDEX, TROPONINI in the last 168 hours. BNP (last 3 results) No results for input(s): PROBNP in the last 8760 hours. HbA1C: No results for input(s): HGBA1C in the last 72 hours. CBG: No results for input(s): GLUCAP in the last 168 hours. Lipid Profile: Recent Labs    04/17/19 0349  TRIG 181*   Thyroid Function Tests: No results for input(s): TSH, T4TOTAL, FREET4, T3FREE, THYROIDAB in the last 72 hours. Anemia Panel: Recent Labs    04/17/19 0349  FERRITIN 318   Sepsis Labs: Recent Labs  Lab 04/17/19 0349 04/17/19 0420 04/17/19 0544  PROCALCITON <0.10  --   --    LATICACIDVEN  --  1.7 1.5    Recent Results (from the past 240 hour(s))  SARS Coronavirus 2 by RT PCR (hospital order, performed in [facility] hospital lab) Nasopharyngeal Nasopharyngeal Swab     Status: None   Collection Time: 04/17/19  3:49 AM   Specimen: Nasopharyngeal Swab  Result Value Ref Range Status   SARS Coronavirus 2 NEGATIVE NEGATIVE Final    Comment: (NOTE) If result is NEGATIVE SARS-CoV-2 target nucleic acids are NOT DETECTED. The SARS-CoV-2 RNA is generally detectable in upper and lower  respiratory specimens during the acute phase of infection. The lowest  concentration of SARS-CoV-2 viral copies this assay can detect is 250  copies / mL. A negative result does not preclude SARS-CoV-2 infection  and should not be used as the sole basis for treatment or other  patient management decisions.  A negative result may occur with  improper specimen collection / handling, submission of specimen other  than nasopharyngeal swab, presence of viral mutation(s) within the  areas targeted by this assay, and inadequate number of viral copies  (<250 copies /  mL). A negative result must be combined with clinical  observations, patient history, and epidemiological information. If result is POSITIVE SARS-CoV-2 target nucleic acids are DETECTED. The SARS-CoV-2 RNA is generally detectable in upper and lower  respiratory specimens dur ing the acute phase of infection.  Positive  results are indicative of active infection with SARS-CoV-2.  Clinical  correlation with patient history and other diagnostic information is  necessary to determine patient infection status.  Positive results do  not rule out bacterial infection or co-infection with other viruses. If result is PRESUMPTIVE POSTIVE SARS-CoV-2 nucleic acids MAY BE PRESENT.   A presumptive positive result was obtained on the submitted specimen  and confirmed on repeat testing.  While 2019 novel coronavirus  (SARS-CoV-2) nucleic  acids may be present in the submitted sample  additional confirmatory testing may be necessary for epidemiological  and / or clinical management purposes  to differentiate between  SARS-CoV-2 and other Sarbecovirus currently known to infect humans.  If clinically indicated additional testing with an alternate test  methodology [phone]) is advised. The SARS-CoV-2 RNA is generally  detectable in upper and lower respiratory sp ecimens during the acute  phase of infection. The expected result is Negative. Fact Sheet for Patients:  StrictlyIdeas.no Fact Sheet for Healthcare Providers: BankingDealers.co.za This test is not yet approved or cleared by the [place] FDA and has been authorized for detection and/or diagnosis of SARS-CoV-2 by FDA under an Emergency Use Authorization (EUA).  This EUA will remain in effect (meaning this test can be used) for the duration of the COVID-19 declaration under Section 564(b)(1) of the Act, 21 U.S.C. section 360bbb-3(b)(1), unless the authorization is terminated or revoked sooner. Performed at [facility], [address] [place], [place] [address]   Blood Culture (routine x 2)     Status: None (Preliminary result)   Collection Time: 04/17/19  4:20 AM   Specimen: Right Antecubital; Blood  Result Value Ref Range Status   Specimen Description RIGHT ANTECUBITAL  Final   Special Requests   Final    BOTTLES DRAWN AEROBIC AND ANAEROBIC Blood Culture adequate volume   Culture   Final    NO GROWTH < 12 HOURS Performed at [facility], [address] [place], [place] [address]    Report Status PENDING  Incomplete  Blood Culture (routine x 2)     Status: None (Preliminary result)   Collection Time: 04/17/19  4:30 AM   Specimen: Left Antecubital; Blood  Result Value Ref Range Status   Specimen Description LEFT ANTECUBITAL  Final   Special Requests   Final    BOTTLES DRAWN AEROBIC AND ANAEROBIC Blood Culture  adequate volume   Culture   Final    NO GROWTH < 12 HOURS Performed at [facility], [address] [place], [place] [address]    Report Status PENDING  Incomplete         Radiology Studies: Nm Pulmonary Vent And Perf (v/q Scan)  Result Date: 04/17/2019 CLINICAL DATA:  Shortness of breath since 3 o'clock this morning. Evaluate for pulmonary embolism. EXAM: NUCLEAR MEDICINE VENTILATION - PERFUSION LUNG SCAN TECHNIQUE: Ventilation images were obtained in multiple projections using inhaled aerosol Tc-15m DTPA. Perfusion images were obtained in multiple projections after intravenous injection of Tc-9m MAA. RADIOPHARMACEUTICALS:  41 mCi of Tc-74m DTPA aerosol inhalation and 1.5 mCi Tc48m MAA IV COMPARISON:  Chest radiograph-earlier same day FINDINGS: Review of chest radiograph performed earlier same day demonstrates ill-defined heterogeneous opacities within the right upper and left  lower lung. Ventilation: Ventilatory images demonstrate clumping of inhaled radiotracer about the bilateral pulmonary hila. There is an ill-defined area of decreased ventilation involving the peripheral aspect of the right upper lung regional to the expected location of the potential developing airspace opacity demonstrated on preceding chest radiograph. No additional areas of non ventilation are identified. Ingested radiotracer is seen within the esophagus and stomach. Perfusion: There is a matched area of decreased perfusion involving the peripheral aspect of the right upper lung correlating with the potential developing airspace opacity seen on preceding chest radiograph. Otherwise, there is relative homogeneous perfusion of the bilateral pulmonary parenchyma without definitive mismatched perfusion defect. IMPRESSION: Very low probability of pulmonary embolism - triple matched defect involving the peripheral aspect of the right upper lung corresponding with the potential developing airspace opacity questioned on preceding  chest radiograph. Electronically Signed   By: Sandi Mariscal M.D.   On: 04/17/2019 11:16   Dg Chest Port 1 View  Result Date: 04/17/2019 CLINICAL DATA:  Shortness of breath beginning yesterday. Mild productive cough. No known fevers. EXAM: PORTABLE CHEST 1 VIEW COMPARISON:  None. FINDINGS: Borderline enlarged cardiac silhouette and mediastinal contours, potentially accentuated due to slightly reduced lung volumes and AP projection. Ill defined heterogeneous opacities are seen within the right upper and left lower lungs. Mild pulmonary venous congestion without frank evidence of edema. No pleural effusion or pneumothorax. No acute osseous abnormalities. IMPRESSION: 1. Potential developing airspace opacities within the right upper and left lower lung, potentially atelectasis though could be seen in the setting of multifocal infection. Clinical correlation is advised. Further evaluation with a PA and lateral chest radiograph may be obtained as clinically indicated. 2. Pulmonary venous congestion without frank evidence of edema. Electronically Signed   By: Sandi Mariscal M.D.   On: 04/17/2019 04:22        Scheduled Meds:  amLODipine  10 mg Oral Daily   mouth rinse  15 mL Mouth Rinse BID   metoprolol tartrate  25 mg Oral BID   sodium chloride flush  3 mL Intravenous Q12H   Continuous Infusions:  sodium chloride     heparin 1,600 Units/hr (04/18/19 0837)     LOS: 1 day    Time spent: 33mins    Kathie Dike, MD [facility]   If 7PM-7AM, please contact night-coverage [email or web address]  04/18/2019, 6:51 PM

## 2019-04-18 NOTE — Consult Note (Signed)
Consult requested by: [facility] hospitalist, Dr. Roderic Palau Consult requested for: Hypoxia  HPI: This is a 63 year old who came to the emergency department because of shortness of breath.  He has history of hypertension but otherwise no known medical illness since.  He was found to be hypertensive and hypoxic on admission to the ED.  Chest x-ray showed potential infiltrates in the right upper and left lower lung.  He also had what appeared to be some pulmonary venous congestion.  I have personally reviewed that film.  He had ventilation/perfusion lung scan that was low probability but today he has had another chest x-ray with concern for infiltrate versus possibly pulmonary infarction and he has had a venous Doppler that is positive for clot.  I have personally reviewed both studies.  He says he has had some pain in his  Leg.  He has a remote smoking history stopped over 20 years ago.  He does not have any diagnosis of any sort of pulmonary disease that he is aware of.  No cardiac history.  He does have the hypertension.  He is unaware of any family history of lung disease.  He has not had any chest pain hemoptysis and he says his breathing is better.  No nausea vomiting diarrhea.  No headaches no urinary symptoms he is not had any swelling of his legs.  No cough or congestion  Past Medical History:  Diagnosis Date  . Hypertension      No family history on file.  No known family history of lung disease  Social History   Socioeconomic History  . Marital status: Married    Spouse name: Not on file  . Number of children: Not on file  . Years of education: Not on file  . Highest education level: Not on file  Occupational History  . Not on file  Social Needs  . Financial resource strain: Not on file  . Food insecurity    Worry: Not on file    Inability: Not on file  . Transportation needs    Medical: Not on file    Non-medical: Not on file  Tobacco Use  . Smoking status: Never Smoker  .  Smokeless tobacco: Never Used  Substance and Sexual Activity  . Alcohol use: Yes  . Drug use: Never  . Sexual activity: Not on file  Lifestyle  . Physical activity    Days per week: Not on file    Minutes per session: Not on file  . Stress: Not on file  Relationships  . Social Herbalist on phone: Not on file    Gets together: Not on file    Attends religious service: Not on file    Active member of club or organization: Not on file    Attends meetings of clubs or organizations: Not on file    Relationship status: Not on file  Other Topics Concern  . Not on file  Social History Narrative  . Not on file     ROS: Except as mentioned 10 point review of systems is negative    Objective: Vital signs in last 24 hours: Temp:  [98.4 F (36.9 C)-98.8 F (37.1 C)] 98.4 F (36.9 C) (10/18 0451) Pulse Rate:  [61-77] 61 (10/18 0451) Resp:  [16-18] 18 (10/18 0451) BP: (163-184)/(79-82) 163/81 (10/18 0736) SpO2:  [96 %-97 %] 97 % (10/18 0808) Weight change: 2.745 kg Last BM Date: 04/17/19  Intake/Output from previous day: 10/17 0701 - 10/18 0700 In:  402.2 [P.O.:240; I.V.:162.2] Out: 1350 [Urine:1350]  PHYSICAL EXAM Constitutional: He is awake and alert and in no acute distress.  Eyes: Pupils react EOMI.  Ears nose mouth and throat: Mucous membranes are moist.  His neck is supple.  Throat is clear.  Cardiovascular: His heart is regular with no gallop.  Respiratory: Respiratory effort is normal and his lungs are pretty clear.  Gastrointestinal: His abdomen is soft with no masses.  Musculoskeletal: Normal strength grossly bilaterally.  Psychiatric: Normal mood and affect neurological: No focal abnormalities  Lab Results: Basic Metabolic Panel: Recent Labs    04/17/19 0349 04/18/19 0834  NA 136 140  K 3.6 4.0  CL 106 108  CO2 19* 23  GLUCOSE 117* 108*  BUN 32* 36*  CREATININE 2.19* 1.98*  CALCIUM 9.9 8.9   Liver Function Tests: Recent Labs    04/17/19 0349  04/18/19 0834  AST 18 14*  ALT 14 13  ALKPHOS 89 67  BILITOT 0.4 0.6  PROT 7.0 5.8*  ALBUMIN 3.7 3.1*   No results for input(s): LIPASE, AMYLASE in the last 72 hours. No results for input(s): AMMONIA in the last 72 hours. CBC: Recent Labs    04/17/19 0349 04/18/19 0834  WBC 10.5 8.0  NEUTROABS 7.7  --   HGB 14.3 12.2*  HCT 42.0 35.9*  MCV 92.5 93.0  PLT 134* 123*   Cardiac Enzymes: No results for input(s): CKTOTAL, CKMB, CKMBINDEX, TROPONINI in the last 72 hours. BNP: No results for input(s): PROBNP in the last 72 hours. D-Dimer: Recent Labs    04/17/19 0349 04/18/19 0834  DDIMER >20.00* 19.72*   CBG: No results for input(s): GLUCAP in the last 72 hours. Hemoglobin A1C: No results for input(s): HGBA1C in the last 72 hours. Fasting Lipid Panel: Recent Labs    04/17/19 0349  TRIG 181*   Thyroid Function Tests: No results for input(s): TSH, T4TOTAL, FREET4, T3FREE, THYROIDAB in the last 72 hours. Anemia Panel: Recent Labs    04/17/19 0349  FERRITIN 318   Coagulation: No results for input(s): LABPROT, INR in the last 72 hours. Urine Drug Screen: Drugs of Abuse  No results found for: LABOPIA, COCAINSCRNUR, LABBENZ, AMPHETMU, THCU, LABBARB  Alcohol Level: No results for input(s): ETH in the last 72 hours. Urinalysis: Recent Labs    04/17/19 1945  COLORURINE YELLOW  LABSPEC 1.012  PHURINE 5.0  GLUCOSEU NEGATIVE  HGBUR LARGE*  BILIRUBINUR NEGATIVE  KETONESUR NEGATIVE  PROTEINUR 30*  NITRITE NEGATIVE  LEUKOCYTESUR NEGATIVE   Misc. Labs:   ABGS: No results for input(s): PHART, PO2ART, TCO2, HCO3 in the last 72 hours.  Invalid input(s): PCO2   MICROBIOLOGY: Recent Results (from the past 240 hour(s))  SARS Coronavirus 2 by RT PCR (hospital order, performed in [facility] hospital lab) Nasopharyngeal Nasopharyngeal Swab     Status: None   Collection Time: 04/17/19  3:49 AM   Specimen: Nasopharyngeal Swab  Result Value Ref Range Status   SARS  Coronavirus 2 NEGATIVE NEGATIVE Final    Comment: (NOTE) If result is NEGATIVE SARS-CoV-2 target nucleic acids are NOT DETECTED. The SARS-CoV-2 RNA is generally detectable in upper and lower  respiratory specimens during the acute phase of infection. The lowest  concentration of SARS-CoV-2 viral copies this assay can detect is 250  copies / mL. A negative result does not preclude SARS-CoV-2 infection  and should not be used as the sole basis for treatment or other  patient management decisions.  A negative result may occur with  improper specimen collection / handling, submission of specimen other  than nasopharyngeal swab, presence of viral mutation(s) within the  areas targeted by this assay, and inadequate number of viral copies  (<250 copies / mL). A negative result must be combined with clinical  observations, patient history, and epidemiological information. If result is POSITIVE SARS-CoV-2 target nucleic acids are DETECTED. The SARS-CoV-2 RNA is generally detectable in upper and lower  respiratory specimens dur ing the acute phase of infection.  Positive  results are indicative of active infection with SARS-CoV-2.  Clinical  correlation with patient history and other diagnostic information is  necessary to determine patient infection status.  Positive results do  not rule out bacterial infection or co-infection with other viruses. If result is PRESUMPTIVE POSTIVE SARS-CoV-2 nucleic acids MAY BE PRESENT.   A presumptive positive result was obtained on the submitted specimen  and confirmed on repeat testing.  While 2019 novel coronavirus  (SARS-CoV-2) nucleic acids may be present in the submitted sample  additional confirmatory testing may be necessary for epidemiological  and / or clinical management purposes  to differentiate between  SARS-CoV-2 and other Sarbecovirus currently known to infect humans.  If clinically indicated additional testing with an alternate test   methodology [phone]) is advised. The SARS-CoV-2 RNA is generally  detectable in upper and lower respiratory sp ecimens during the acute  phase of infection. The expected result is Negative. Fact Sheet for Patients:  StrictlyIdeas.no Fact Sheet for Healthcare Providers: BankingDealers.co.za This test is not yet approved or cleared by the [place] FDA and has been authorized for detection and/or diagnosis of SARS-CoV-2 by FDA under an Emergency Use Authorization (EUA).  This EUA will remain in effect (meaning this test can be used) for the duration of the COVID-19 declaration under Section 564(b)(1) of the Act, 21 U.S.C. section 360bbb-3(b)(1), unless the authorization is terminated or revoked sooner. Performed at [facility], [address] [place], [place] [address]   Blood Culture (routine x 2)     Status: None (Preliminary result)   Collection Time: 04/17/19  4:20 AM   Specimen: Right Antecubital; Blood  Result Value Ref Range Status   Specimen Description RIGHT ANTECUBITAL  Final   Special Requests   Final    BOTTLES DRAWN AEROBIC AND ANAEROBIC Blood Culture adequate volume   Culture   Final    NO GROWTH < 12 HOURS Performed at [facility], [address], [place], [place] [address]    Report Status PENDING  Incomplete  Blood Culture (routine x 2)     Status: None (Preliminary result)   Collection Time: 04/17/19  4:30 AM   Specimen: Left Antecubital; Blood  Result Value Ref Range Status   Specimen Description LEFT ANTECUBITAL  Final   Special Requests   Final    BOTTLES DRAWN AEROBIC AND ANAEROBIC Blood Culture adequate volume   Culture   Final    NO GROWTH < 12 HOURS Performed at [facility], [address], [place], [place] [address]    Report Status PENDING  Incomplete    Studies/Results: Nm Pulmonary Vent And Perf (v/q Scan)  Result Date: 04/17/2019 CLINICAL DATA:  Shortness of breath since 3 o'clock this  morning. Evaluate for pulmonary embolism. EXAM: NUCLEAR MEDICINE VENTILATION - PERFUSION LUNG SCAN TECHNIQUE: Ventilation images were obtained in multiple projections using inhaled aerosol Tc-34m DTPA. Perfusion images were obtained in multiple projections after intravenous injection of Tc-89m MAA. RADIOPHARMACEUTICALS:  41 mCi of Tc-46m DTPA aerosol inhalation and 1.5  mCi Tc85m MAA IV COMPARISON:  Chest radiograph-earlier same day FINDINGS: Review of chest radiograph performed earlier same day demonstrates ill-defined heterogeneous opacities within the right upper and left lower lung. Ventilation: Ventilatory images demonstrate clumping of inhaled radiotracer about the bilateral pulmonary hila. There is an ill-defined area of decreased ventilation involving the peripheral aspect of the right upper lung regional to the expected location of the potential developing airspace opacity demonstrated on preceding chest radiograph. No additional areas of non ventilation are identified. Ingested radiotracer is seen within the esophagus and stomach. Perfusion: There is a matched area of decreased perfusion involving the peripheral aspect of the right upper lung correlating with the potential developing airspace opacity seen on preceding chest radiograph. Otherwise, there is relative homogeneous perfusion of the bilateral pulmonary parenchyma without definitive mismatched perfusion defect. IMPRESSION: Very low probability of pulmonary embolism - triple matched defect involving the peripheral aspect of the right upper lung corresponding with the potential developing airspace opacity questioned on preceding chest radiograph. Electronically Signed   By: Sandi Mariscal M.D.   On: 04/17/2019 11:16   Dg Chest Port 1 View  Result Date: 04/17/2019 CLINICAL DATA:  Shortness of breath beginning yesterday. Mild productive cough. No known fevers. EXAM: PORTABLE CHEST 1 VIEW COMPARISON:  None. FINDINGS: Borderline enlarged cardiac  silhouette and mediastinal contours, potentially accentuated due to slightly reduced lung volumes and AP projection. Ill defined heterogeneous opacities are seen within the right upper and left lower lungs. Mild pulmonary venous congestion without frank evidence of edema. No pleural effusion or pneumothorax. No acute osseous abnormalities. IMPRESSION: 1. Potential developing airspace opacities within the right upper and left lower lung, potentially atelectasis though could be seen in the setting of multifocal infection. Clinical correlation is advised. Further evaluation with a PA and lateral chest radiograph may be obtained as clinically indicated. 2. Pulmonary venous congestion without frank evidence of edema. Electronically Signed   By: Sandi Mariscal M.D.   On: 04/17/2019 04:22    Medications:  Prior to Admission:  Medications Prior to Admission  Medication Sig Dispense Refill Last Dose  . amLODipine (NORVASC) 10 MG tablet Take 10 mg by mouth daily.   04/16/2019 at Unknown time  . metoprolol succinate (TOPROL-XL) 50 MG 24 hr tablet Take 50 mg by mouth daily.   04/16/2019 at 0800   Scheduled: . amLODipine  10 mg Oral Daily  . mouth rinse  15 mL Mouth Rinse BID  . metoprolol tartrate  25 mg Oral BID  . sodium chloride flush  3 mL Intravenous Q12H   Continuous: . sodium chloride    . heparin 1,600 Units/hr (04/18/19 0837)   [facility]:NOBSJG chloride, acetaminophen **OR** acetaminophen, hydrALAZINE, ondansetron **OR** ondansetron (ZOFRAN) IV, oxyCODONE-acetaminophen, sodium chloride flush  Assesment: He was admitted with acute hypoxic respiratory failure.  He had ventilation/perfusion lung scan that was low probability but he does have DVT by Doppler and has pulmonary hypertension so I suspect that he has had pulmonary emboli.  He is on heparin which is appropriate.  He has abnormalities on chest x-ray and is still not a candidate for contrasted CT but I am going order noncontrast CT to see if we can  get better characterization of what sort of problem he has. Active Problems:   Hypertensive urgency   Acute respiratory failure with hypoxia (HCC)   HTN (hypertension)   Pulmonary hypertension (HCC)   AKI (acute kidney injury) ([place])    Plan: Noncontrast CT of the chest.  Continue anticoagulation  LOS: 1 day   Alonza Bogus 04/18/2019, 1:03 PM

## 2019-04-19 ENCOUNTER — Encounter (HOSPITAL_COMMUNITY): Payer: Self-pay | Admitting: General Surgery

## 2019-04-19 DIAGNOSIS — R590 Localized enlarged lymph nodes: Secondary | ICD-10-CM

## 2019-04-19 LAB — CBC
HCT: 35.3 % — ABNORMAL LOW (ref 39.0–52.0)
Hemoglobin: 11.8 g/dL — ABNORMAL LOW (ref 13.0–17.0)
MCH: 31.5 pg (ref 26.0–34.0)
MCHC: 33.4 g/dL (ref 30.0–36.0)
MCV: 94.1 fL (ref 80.0–100.0)
Platelets: 134 10*3/uL — ABNORMAL LOW (ref 150–400)
RBC: 3.75 MIL/uL — ABNORMAL LOW (ref 4.22–5.81)
RDW: 12.3 % (ref 11.5–15.5)
WBC: 7.4 10*3/uL (ref 4.0–10.5)
nRBC: 0 % (ref 0.0–0.2)

## 2019-04-19 LAB — HEPARIN LEVEL (UNFRACTIONATED)
Heparin Unfractionated: 0.28 IU/mL — ABNORMAL LOW (ref 0.30–0.70)
Heparin Unfractionated: 0.36 IU/mL (ref 0.30–0.70)

## 2019-04-19 LAB — LACTATE DEHYDROGENASE: LDH: 145 U/L (ref 98–192)

## 2019-04-19 LAB — URIC ACID: Uric Acid, Serum: 9.4 mg/dL — ABNORMAL HIGH (ref 3.7–8.6)

## 2019-04-19 MED ORDER — LACTATED RINGERS IV SOLN
INTRAVENOUS | Status: DC
  Administered 2019-04-19: 16:00:00 via INTRAVENOUS

## 2019-04-19 MED ORDER — OXYBUTYNIN CHLORIDE 5 MG PO TABS: 5.0000 mg | ORAL_TABLET | Freq: Three times a day (TID) | ORAL | Status: DC

## 2019-04-19 MED ORDER — HEPARIN BOLUS VIA INFUSION
1000.0000 [IU] | Freq: Once | INTRAVENOUS | Status: AC
  Administered 2019-04-19: 1000 [IU] via INTRAVENOUS
  Filled 2019-04-19: qty 1000

## 2019-04-19 MED ORDER — HYDRALAZINE HCL 20 MG/ML IJ SOLN
10.0000 mg | INTRAMUSCULAR | Status: DC | PRN
  Administered 2019-04-21: 10 mg via INTRAVENOUS
  Filled 2019-04-19: qty 1

## 2019-04-19 NOTE — Progress Notes (Addendum)
ANTICOAGULATION CONSULT NOTE - Follow-Up Consult  Pharmacy Consult for Heparin Indication: VTE treatment  No Known Allergies  Patient Measurements: Height: 5\' 6"  (167.6 cm) Weight: 216 lb 0.8 oz (98 kg) IBW/kg (Calculated) : 63.8 HEPARIN DW (KG): 85.2  Vital Signs: Temp: 98.7 F (37.1 C) (10/19 0516) Temp Source: Oral (10/19 0516) BP: 183/70 (10/19 0516) Pulse Rate: 66 (10/19 0516)  Labs: Recent Labs    04/17/19 0349 04/17/19 0544  04/18/19 0834 04/18/19 1608 04/19/19 0555  HGB 14.3  --   --  12.2*  --  11.8*  HCT 42.0  --   --  35.9*  --  35.3*  PLT 134*  --   --  123*  --  134*  HEPARINUNFRC  --   --    < > 0.41 0.35 0.28*  CREATININE 2.19*  --   --  1.98*  --   --   TROPONINIHS 45* 55*  --   --   --   --    < > = values in this interval not displayed.    Estimated Creatinine Clearance: 41.3 mL/min (A) (by C-G formula based on SCr of 1.98 mg/dL (H)).   Medical History: Past Medical History:  Diagnosis Date  . Hypertension     Medications:  Medications Prior to Admission  Medication Sig Dispense Refill Last Dose  . amLODipine (NORVASC) 10 MG tablet Take 10 mg by mouth daily.   04/16/2019 at Unknown time  . metoprolol succinate (TOPROL-XL) 50 MG 24 hr tablet Take 50 mg by mouth daily.   04/16/2019 at 0800    Assessment: Patient presented with SOB. Patient has significant elevated D-dimer . VQ scan negative for PE. MD concerned for VTE and asked to start heparin.   HL is subtherpeutic slightly  No line or bleeding issues per RN   Goal of Therapy:  Heparin level 0.3-0.7 units/ml Monitor platelets by anticoagulation protocol: Yes   Plan:  Heparin 1000 units bolus, then  Increase  heparin infusion at 1750 units/hr Check anti-Xa level in ~6-8 hours daily while on heparin Continue to monitor H&H and platelets  Isac Sarna, BS Vena [place], BCPS Clinical Pharmacist Pager [phone] 04/19/2019 8:08 AM

## 2019-04-19 NOTE — H&P (View-Only) (Signed)
[facility] Consult  Reason for Consult: Adenopathy  Referring Physician: Dr. Manuella Ghazi   Chief Complaint    Shortness of Breath      HPI: Kevin [name] is a 63 y.o. male with adenopathy in the hilar region, inguinal region, and concern for a abdominal mass that is concerning for lymphoma. The patient was admitted with SOB that was acute in onset and woke him up from sleep prior to presenting to the ED.  The patient was seen in the ED and evaluated and found to have the adenopathy as well as bilateral DVTs and concern for PE. He underwent a later VQ scan that demonstrated a low probability of a PE but there is still concern due to his presentation of SOB that there could be underlying pulmonary embolism.    Due to the concern for lymphoma, I have been asked to perform a biopsy of the inguinal node while the patient is in the hospital on IV heparin gtt for anticoagulation.  He reports he has noticed some groin lumps mostly on the left, and reports a recent injury to his left foot from a ladder.  Otherwise he has had no issues aside from the acute onset of SOB.  He has a family history of leukemia in his brother.  He has no history of any cardiac issues.  EKG with sinus rhythm, ECHO with EF 60-65%, mild LVH.   Past Medical History:  Diagnosis Date  . Hypertension     History reviewed. No pertinent surgical history.  Family History  Problem Relation Age of Onset  . Leukemia Brother     Social History   Tobacco Use  . Smoking status: Never Smoker  . Smokeless tobacco: Never Used  Substance Use Topics  . Alcohol use: Yes  . Drug use: Never    Medications:  I have reviewed the patient's current medications. Prior to Admission:  Medications Prior to Admission  Medication Sig Dispense Refill Last Dose  . amLODipine (NORVASC) 10 MG tablet Take 10 mg by mouth daily.   04/16/2019 at Unknown time  . metoprolol succinate (TOPROL-XL) 50 MG 24 hr tablet Take 50 mg by mouth  daily.   04/16/2019 at 0800   Scheduled: . amLODipine  10 mg Oral Daily  . mouth rinse  15 mL Mouth Rinse BID  . metoprolol tartrate  25 mg Oral BID  . sodium chloride flush  3 mL Intravenous Q12H   Continuous: . sodium chloride    . heparin 1,750 Units/hr (04/19/19 1740)  . lactated ringers     CXK:GYJEHU chloride, acetaminophen **OR** acetaminophen, hydrALAZINE, hydrALAZINE, ondansetron **OR** ondansetron (ZOFRAN) IV, oxyCODONE-acetaminophen, sodium chloride flush  No Known Allergies   ROS:  A comprehensive review of systems was negative except for: Respiratory: positive for SOB Hematologic/lymphatic: positive for lymphadenopathy Musculoskeletal: positive for left foot swelling and bruising  Blood pressure (!) 170/69, pulse 69, temperature 99 F (37.2 C), temperature source Oral, resp. rate 18, height 5\' 6"  (1.676 m), weight 98 kg, SpO2 98 %. Physical Exam Vitals signs reviewed.  Constitutional:      Appearance: He is well-developed.  HENT:     Head: Normocephalic and atraumatic.  Eyes:     Pupils: Pupils are equal, round, and reactive to light.  Neck:     Musculoskeletal: Normal range of motion.  Cardiovascular:     Rate and Rhythm: Normal rate and regular rhythm.  Pulmonary:     Effort: Pulmonary effort is normal.  Breath sounds: Normal breath sounds.  Abdominal:     Palpations: Abdomen is soft.  Musculoskeletal:     Right lower leg: He exhibits no tenderness.     Left lower leg: He exhibits no tenderness. Edema present.     Comments: Left foot with bruising across dorsal forefoot  Lymphadenopathy:     Head:     Right side of head: No submandibular adenopathy.     Left side of head: No submandibular adenopathy.     Cervical: No cervical adenopathy.     Upper Body:     Right upper body: No supraclavicular or axillary adenopathy.     Left upper body: No supraclavicular or axillary adenopathy.     Lower Body: Right inguinal adenopathy present. Left inguinal  adenopathy present.     Comments: Right inguinal node, more superficial 3-3.5cm  Skin:    General: Skin is warm and dry.  Neurological:     General: No focal deficit present.     Mental Status: He is alert and oriented to person, place, and time.  Psychiatric:        Mood and Affect: Mood normal.        Behavior: Behavior normal.     Results: Results for orders placed or performed during the hospital encounter of 04/17/19 (from the past 48 hour(s))  Urinalysis, Routine w reflex microscopic     Status: Abnormal   Collection Time: 04/17/19  7:45 PM  Result Value Ref Range   Color, Urine YELLOW YELLOW   APPearance CLEAR CLEAR   Specific Gravity, Urine 1.012 1.005 - 1.030   pH 5.0 5.0 - 8.0   Glucose, UA NEGATIVE NEGATIVE mg/dL   Hgb urine dipstick LARGE (A) NEGATIVE   Bilirubin Urine NEGATIVE NEGATIVE   Ketones, ur NEGATIVE NEGATIVE mg/dL   Protein, ur 30 (A) NEGATIVE mg/dL   Nitrite NEGATIVE NEGATIVE   Leukocytes,Ua NEGATIVE NEGATIVE   RBC / HPF >50 (H) 0 - 5 RBC/hpf   WBC, UA 0-5 0 - 5 WBC/hpf   Bacteria, UA NONE SEEN NONE SEEN    Comment: Performed at [facility], [address] [place], [place] [address]  Heparin level (unfractionated)     Status: Abnormal   Collection Time: 04/18/19 12:30 AM  Result Value Ref Range   Heparin Unfractionated 0.20 (L) 0.30 - 0.70 IU/mL    Comment: (NOTE) If heparin results are below expected values, and patient dosage has  been confirmed, suggest follow up testing of antithrombin III levels. Performed at [facility], [address], [place], [place] [address]   CBC     Status: Abnormal   Collection Time: 04/18/19  8:34 AM  Result Value Ref Range   WBC 8.0 4.0 - 10.5 K/uL   RBC 3.86 (L) 4.22 - 5.81 MIL/uL   Hemoglobin 12.2 (L) 13.0 - 17.0 g/dL   HCT 35.9 (L) 39.0 - 52.0 %   MCV 93.0 80.0 - 100.0 fL   MCH 31.6 26.0 - 34.0 pg   MCHC 34.0 30.0 - 36.0 g/dL   RDW 12.5 11.5 - 15.5 %   Platelets 123 (L) 150 - 400 K/uL    Comment:  Immature Platelet Fraction may be clinically indicated, consider ordering this additional test [id number]    nRBC 0.0 0.0 - 0.2 %    Comment: Performed at [facility], [address], [place], [place] [address]  Comprehensive metabolic panel     Status: Abnormal   Collection Time: 04/18/19  8:34 AM  Result Value Ref Range   Sodium 140 135 - 145 mmol/L   Potassium 4.0 3.5 - 5.1 mmol/L   Chloride 108 98 - 111 mmol/L   CO2 23 22 - 32 mmol/L   Glucose, Bld 108 (H) 70 - 99 mg/dL   BUN 36 (H) 8 - 23 mg/dL   Creatinine, Ser 1.98 (H) 0.61 - 1.24 mg/dL   Calcium 8.9 8.9 - 10.3 mg/dL   Total Protein 5.8 (L) 6.5 - 8.1 g/dL   Albumin 3.1 (L) 3.5 - 5.0 g/dL   AST 14 (L) 15 - 41 U/L   ALT 13 0 - 44 U/L   Alkaline Phosphatase 67 38 - 126 U/L   Total Bilirubin 0.6 0.3 - 1.2 mg/dL   GFR calc non Af Amer 35 (L) >60 mL/min   GFR calc Af Amer 40 (L) >60 mL/min   Anion gap 9 5 - 15    Comment: Performed at [facility], [address], [place], [place] [address]  D-dimer, quantitative (not at [facility])     Status: Abnormal   Collection Time: 04/18/19  8:34 AM  Result Value Ref Range   D-Dimer, Quant 19.72 (H) 0.00 - 0.50 ug/mL-FEU    Comment: (NOTE) At the manufacturer cut-off of 0.50 ug/mL FEU, this assay has been documented to exclude PE with a sensitivity and negative predictive value of 97 to 99%.  At this time, this assay has not been approved by the FDA to exclude DVT/VTE. Results should be correlated with clinical presentation. Performed at [facility], [address] [place], [place] [address]   C-reactive protein     Status: Abnormal   Collection Time: 04/18/19  8:34 AM  Result Value Ref Range   CRP 12.6 (H) <1.0 mg/dL    Comment: Performed at [facility], [address] [place], [place] [address]  Heparin level (unfractionated)     Status: None   Collection Time: 04/18/19  8:34 AM  Result Value Ref Range   Heparin Unfractionated 0.41 0.30 - 0.70 IU/mL    Comment: (NOTE) If heparin  results are below expected values, and patient dosage has  been confirmed, suggest follow up testing of antithrombin III levels. Performed at [facility], [address], [place], [place] [address]   Heparin level (unfractionated)     Status: None   Collection Time: 04/18/19  4:08 PM  Result Value Ref Range   Heparin Unfractionated 0.35 0.30 - 0.70 IU/mL    Comment: (NOTE) If heparin results are below expected values, and patient dosage has  been confirmed, suggest follow up testing of antithrombin III levels. Performed at [facility], [address], [place], [place] [address]   Heparin level (unfractionated)     Status: Abnormal   Collection Time: 04/19/19  5:55 AM  Result Value Ref Range   Heparin Unfractionated 0.28 (L) 0.30 - 0.70 IU/mL    Comment: (NOTE) If heparin results are below expected values, and patient dosage has  been confirmed, suggest follow up testing of antithrombin III levels. Performed at [facility], [address] [place], [place] [address]   CBC     Status: Abnormal   Collection Time: 04/19/19  5:55 AM  Result Value Ref Range   WBC 7.4 4.0 - 10.5 K/uL   RBC 3.75 (L) 4.22 - 5.81 MIL/uL   Hemoglobin 11.8 (L) 13.0 - 17.0 g/dL   HCT 35.3 (L) 39.0 - 52.0 %   MCV 94.1 80.0 - 100.0 fL   MCH 31.5  26.0 - 34.0 pg   MCHC 33.4 30.0 - 36.0 g/dL   RDW 12.3 11.5 - 15.5 %   Platelets 134 (L) 150 - 400 K/uL   nRBC 0.0 0.0 - 0.2 %    Comment: Performed at [facility], [address], [place], [place] [address]  Lactate dehydrogenase     Status: None   Collection Time: 04/19/19  5:55 AM  Result Value Ref Range   LDH 145 98 - 192 U/L    Comment: Performed at [facility], [address], [place], [place] [address]  Uric acid     Status: Abnormal   Collection Time: 04/19/19  5:55 AM  Result Value Ref Range   Uric Acid, Serum 9.4 (H) 3.7 - 8.6 mg/dL    Comment: Performed at [facility], [address], [place], [place] [address]   Personally reviewed- abdominal mass  noted, right and left inguinal nodes, right more superficial and 3.5cm measurement  Ct Abdomen Pelvis Wo Contrast  Result Date: 04/18/2019 CLINICAL DATA:  Non pulsatile abdominal mass. Hydronephrosis. Bilateral lower extremity DVTs. EXAM: CT ABDOMEN AND PELVIS WITHOUT CONTRAST TECHNIQUE: Multidetector CT imaging of the abdomen and pelvis was performed following the standard protocol without IV contrast. COMPARISON:  Chest CT earlier this day. FINDINGS: Lower chest: Patchy ground-glass airspace disease as seen on chest CT earlier this day. Small left pleural effusions/thickening. Hepatobiliary: No evidence of focal hepatic lesion, limited evaluation due to lack of IV contrast. Question of nodular hepatic contours. Gallbladder physiologically distended, no calcified stone. No biliary dilatation, however distal common bile duct not well-defined. Pancreas: Large soft tissue mass in the central abdomen involves the body, possibly neck of the pancreas. No distal ductal dilatation. No definite frank peripancreatic inflammation. Spleen: Normal in size without focal abnormality. Spleen measures 10.4 cm. Adrenals/Urinary Tract: Low-density 18 mm left adrenal nodule is most consistent with adenoma. Inferior aspect of the adrenal gland is poorly defined may be contiguous with the large central abdominal mass. The right adrenal gland is normal. Mild to moderate hydronephrosis. Right perinephric edema and thickening of the perirenal fascia. Possible thickening of the right proximal ureter and renal pelvis, for example series 2, image 35. Distal ureter is ill-defined. Fullness of the left renal collecting system without definitive hydronephrosis. There is left perinephric edema and thickening of the perirenal fascia. Ill-defined density in the region of the left ureter, not well assessed in the absence of contrast. Urinary bladder is minimally distended but thick walled. There is perivesicular edema. Stomach/Bowel:  Nondistended stomach. Central abdominal mass abutting the fourth portion of the duodenum and proximal jejunum. Evidence of gastric outlet obstruction. Mass travels along the root of the mesentery. No bowel obstruction. Administered enteric contrast opacifies normal ileal small bowel loops and colon. Normal appendix. Vascular/Lymphatic: Bulky right external iliac adenopathy, nodal conglomerate measuring 5.4 x 3.8 cm, series 2, image 75. There are enlarged right inguinal nodes measuring up to 2.3 cm short axis. Prominent enlarged left inguinal lymph nodes measuring up to 2.4 cm. Right pelvic sidewall adenopathy is well-defined in the absence of IV contrast. Central abdominal mass at the root of the mesentery may represent a large nodal conglomerate, exact measurements are difficult due to the ill-defined shape, however this measures approximately 12 x 7.2 x 11.2 cm. Additional enlarged adjacent nodes in the mesentery. Presumed bilateral retroperitoneal adenopathy, left periaortic ill-defined node series 2, image 30. Enlarged lymph nodes at the root of the celiac axis, porta hepatis, and small nodules in the right  perinephric space superiorly. Advanced aortic atherosclerosis. No evidence of aortic aneurysm. Reproductive: Prostate is unremarkable. Other: Fat in both inguinal canals. Large central mesenteric mass as described above has surrounding edema and soft tissue stranding. Bilateral retroperitoneal stranding. No definite ascites. No free air. Patchy edema in the right lower anterior abdominal wall is nonspecific. 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 No focal bone lesion. Intervertebral disc calcifications noted in the lower thoracic spine. IMPRESSION: 1. Dominant soft tissue mass in the proximal mesentery measuring up to 12 cm. There is also a multifocal adenopathy throughout the abdomen and pelvis. In conjunction with thoracic adenopathy, findings  highly suspicious for lymphoproliferative disorder such as lymphoma. Metastatic disease is also considered. Enlarged inguinal nodes (right greater than left) may be an easily accessible option for tissue sampling. 2. Right hydronephrosis and fullness of the left renal collecting system, this may be secondary to mass effect from central abdominal mass or adenopathy. There is bilateral proximal ureteral thickening. 3. Possible nodular hepatic contours, raising concern for cirrhosis. Recommend correlation for cirrhotic risk factors. 4. Advanced aortic atherosclerosis. Aortic Atherosclerosis (ICD10-I70.0). Electronically Signed   By: Keith Rake M.D.   On: 04/18/2019 23:23   Dg Chest 2 View  Result Date: 04/18/2019 CLINICAL DATA:  DVT.  Acute respiratory failure with hypoxia. EXAM: CHEST - 2 VIEW COMPARISON:  04/17/2019; bilateral lower extremity venous Doppler ultrasound-earlier same day FINDINGS: Grossly unchanged enlarged cardiac silhouette and mediastinal contours with nodular prominence of the bilateral pulmonary hila. Atherosclerotic plaque within the thoracic aorta. There is persistent thickening the right paratracheal stripe presumably secondary prominent vasculature. Ill-defined heterogeneous potential airspace opacities within the right mid and left lower lung are unchanged. Mild cephalization of flow without frank evidence of edema. No pleural effusion or pneumothorax. No evidence of edema. IMPRESSION: 1. Grossly unchanged ill-defined heterogeneous opacities within the right upper and left lower lungs, potentially atelectasis or asymmetric pulmonary edema though developing infection could have a similar appearance. Additionally, in the setting of known lower extremity DVT, conceivably pulmonary infarction could have a similar appearance. 2. Redemonstrated prominence of the bilateral pulmonary hila, potentially secondary to prominent pulmonary vasculature though given presence of potential left  inguinal adenopathy (demonstrated on preceding lower extremity Doppler ultrasound), conceivably hilar adenopathy could have a similar appearance. Further evaluation with chest CT (preferably contrast-enhanced) could be performed as clinically indicated. 3. Pulmonary venous congestion without frank evidence of edema. Electronically Signed   By: Sandi Mariscal M.D.   On: 04/18/2019 11:24   Ct Chest Wo Contrast  Result Date: 04/18/2019 CLINICAL DATA:  Shortness of breath upon arrival to ED yesterday. Acute hypoxic respiratory failure. COVID-19 test is negative. EXAM: CT CHEST WITHOUT CONTRAST TECHNIQUE: Multidetector CT imaging of the chest was performed following the standard protocol without IV contrast. COMPARISON:  Two-view chest x-ray 04/18/2019 FINDINGS: Cardiovascular: Heart size is normal. Coronary artery calcifications are present. Atherosclerotic changes are present in the aortic arch and at the great vessel origins. The left vertebral artery originates from the arch. There is no aneurysm. Pulmonary artery size is within normal limits. Mediastinum/Nodes: Subcarinal in bilateral hilar adenopathy is present, left greater than right. Enlarged axillary nodes are present bilaterally. Left axillary node measures 1.7 cm in short axis. Right axillary node measures 1.6 cm in short axis. Subcarinal node measures 2.5 cm in short axis. Lungs/Pleura: Patchy bilateral airspace opacities are present bilaterally this is most prominent in the right upper lobe and superior segment of the  right lower lobe. There is some prominence in the left lower lobe. Left pleural effusion is present. The airspace disease is more central than peripheral. There is no pneumothorax. No significant right-sided pleural effusion is present. Upper Abdomen: Edematous changes are present at the kidneys bilaterally. Cortical lymph nodes are suspected. Musculoskeletal: Vertebral body heights alignment are maintained. No focal lytic or blastic lesions  are present. The ribs are unremarkable. IMPRESSION: 1. Patchy bilateral airspace disease. Findings are most worrisome for infection. 2. Mediastinal and bilateral hilar adenopathy, left greater than right. While this may be related to the acute infectious/inflammatory process, neoplasm is not excluded. There are no calcifications to suggest granulomatous disease. Sarcoid is not excluded. 3. Bilateral axillary adenopathy is also concerning for malignancy. 4.  Aortic Atherosclerosis (ICD10-I70.0). Electronically Signed   By: San Morelle M.D.   On: 04/18/2019 15:47   US Renal  Result Date: 04/18/2019 CLINICAL DATA:  Acute renal insufficiency. EXAM: RENAL / URINARY TRACT ULTRASOUND COMPLETE COMPARISON:  None. FINDINGS: Right Kidney: Renal measurements: 12.6 x 6.3 x 8.4 cm = volume: 347 mL. Mild left hydronephrosis identified. Left Kidney: Renal measurements: 13 x 6.9 x 7.2 cm = volume: 336 mL. Echogenicity within normal limits. No mass or hydronephrosis visualized. Bladder: The bladder is decompressed. Other: There is a complex mass in the right side of the pelvis/right lower quadrant with some internal blood flow. IMPRESSION: 1. There is a complex mass in the right lower quadrant/right pelvis, with internal blood flow. A CT scan may better evaluate. Malignancy not excluded. 2. Mild hydronephrosis associated with the right kidney, age indeterminate. Recommend clinical correlation. Electronically Signed   By: Dorise Bullion III M.D   On: 04/18/2019 12:50   US Venous Img Lower Bilateral  Result Date: 04/18/2019 CLINICAL DATA:  Bilateral lower extremity pain and edema for the past week. Shortness of breath. Patient is on heparin. Evaluate for DVT. EXAM: BILATERAL LOWER EXTREMITY VENOUS DOPPLER ULTRASOUND TECHNIQUE: Gray-scale sonography with graded compression, as well as color Doppler and duplex ultrasound were performed to evaluate the lower extremity deep venous systems from the level of the common  femoral vein and including the common femoral, femoral, profunda femoral, popliteal and calf veins including the posterior tibial, peroneal and gastrocnemius veins when visible. The superficial great saphenous vein was also interrogated. Spectral Doppler was utilized to evaluate flow at rest and with distal augmentation maneuvers in the common femoral, femoral and popliteal veins. COMPARISON:  Nuclear medicine V/Q scan-04/17/2019; chest radiograph-earlier same day FINDINGS: RIGHT LOWER EXTREMITY There is hypoechoic expansile near occlusive thrombus within the right common femoral vein (image 3), extending to involve the saphenofemoral junction (image 9). There is hypoechoic near occlusive thrombus involving the proximal aspect of the right deep femoral vein (image 12). There is hypoechoic occlusive thrombus involving the proximal (image 14) mid (image 17) and distal (image 22) aspects of the right femoral vein. There is hypoechoic nonocclusive thrombus within the right popliteal vein (image 27 extending to involve the right posterior tibial (image 31) and peroneal (image 33) veins. The anterior tibial vein appears patent where imaged. Other Findings:  None. _________________________________________________________ LEFT LOWER EXTREMITY Common Femoral Vein: No evidence of thrombus. Normal compressibility, respiratory phasicity and response to augmentation. Saphenofemoral Junction: No evidence of thrombus. Normal compressibility and flow on color Doppler imaging. Profunda Femoral Vein: No evidence of thrombus. Normal compressibility and flow on color Doppler imaging. Femoral Vein: No evidence of thrombus. Normal compressibility, respiratory phasicity and response to augmentation. Popliteal Vein: No evidence  of thrombus. Normal compressibility, respiratory phasicity and response to augmentation. Calf Veins: There is hypoechoic occlusive expansile thrombus within the left posterior tibial (image 72) as well as the left  peroneal (image 75) veins. The anterior tibial vein appears patent where imaged. Superficial Great Saphenous Vein: No evidence of thrombus. Normal compressibility. Other Findings: There is an approximately 2.8 x 3.2 x 2.7 cm hypoechoic nodule within the left groin which appears to demonstrate internal blood flow and may represent a pathologically enlarged left inguinal lymph node. IMPRESSION: 1. The examination is positive for rather extensive predominantly occlusive right lower extremity DVT extending from the right common femoral vein through the imaged tibial veins. 2. The examination is positive for occlusive DVT involving the left posterior tibial and peroneal veins. 3. Indeterminate approximately 3.2 cm hypoechoic nodule within left groin, indeterminate though could represent a pathologically enlarged lymph node. Further evaluation with contrast-enhanced CT scan could be performed as indicated. Electronically Signed   By: Sandi Mariscal M.D.   On: 04/18/2019 11:19    Assessment & Plan:  RAYDEN DOCK is a 63 y.o. male with adenopathy and abdominal mass concerning for lymphoma in the setting of bilateral DVT, concerning presentation for PE despite negative VQ scan, and currently on a heparin gtt. Given the need for tissue diagnosis, I have been asked to biopsy the inguinal node.   -Right inguinal lymph node biopsy, anesthesia choice, relatively superficial in nature, can do local/ versus block if needed; discussed risk of bleeding, infection, inadequate tissue sampling/ no diagnosis, lymph leak  -Will plan for Wednesday for the biopsy -Will place orders for consent and holding heparin gtt tomorrow   All questions were answered to the satisfaction of the patient and family. Discussed with Dr. Manuella Ghazi.  Virl Cagey 04/19/2019, 3:04 PM

## 2019-04-19 NOTE — Consult Note (Signed)
[facility] Consult  Reason for Consult: Adenopathy  Referring Physician: Dr. Manuella Ghazi   Chief Complaint    Shortness of Breath      HPI: Kevin [name] is a 63 y.o. male with adenopathy in the hilar region, inguinal region, and concern for a abdominal mass that is concerning for lymphoma. The patient was admitted with SOB that was acute in onset and woke him up from sleep prior to presenting to the ED.  The patient was seen in the ED and evaluated and found to have the adenopathy as well as bilateral DVTs and concern for PE. He underwent a later VQ scan that demonstrated a low probability of a PE but there is still concern due to his presentation of SOB that there could be underlying pulmonary embolism.    Due to the concern for lymphoma, I have been asked to perform a biopsy of the inguinal node while the patient is in the hospital on IV heparin gtt for anticoagulation.  He reports he has noticed some groin lumps mostly on the left, and reports a recent injury to his left foot from a ladder.  Otherwise he has had no issues aside from the acute onset of SOB.  He has a family history of leukemia in his brother.  He has no history of any cardiac issues.  EKG with sinus rhythm, ECHO with EF 60-65%, mild LVH.   Past Medical History:  Diagnosis Date  . Hypertension     History reviewed. No pertinent surgical history.  Family History  Problem Relation Age of Onset  . Leukemia Brother     Social History   Tobacco Use  . Smoking status: Never Smoker  . Smokeless tobacco: Never Used  Substance Use Topics  . Alcohol use: Yes  . Drug use: Never    Medications:  I have reviewed the patient's current medications. Prior to Admission:  Medications Prior to Admission  Medication Sig Dispense Refill Last Dose  . amLODipine (NORVASC) 10 MG tablet Take 10 mg by mouth daily.   04/16/2019 at Unknown time  . metoprolol succinate (TOPROL-XL) 50 MG 24 hr tablet Take 50 mg by mouth  daily.   04/16/2019 at 0800   Scheduled: . amLODipine  10 mg Oral Daily  . mouth rinse  15 mL Mouth Rinse BID  . metoprolol tartrate  25 mg Oral BID  . sodium chloride flush  3 mL Intravenous Q12H   Continuous: . sodium chloride    . heparin 1,750 Units/hr (04/19/19 7253)  . lactated ringers     GUY:QIHKVQ chloride, acetaminophen **OR** acetaminophen, hydrALAZINE, hydrALAZINE, ondansetron **OR** ondansetron (ZOFRAN) IV, oxyCODONE-acetaminophen, sodium chloride flush  No Known Allergies   ROS:  A comprehensive review of systems was negative except for: Respiratory: positive for SOB Hematologic/lymphatic: positive for lymphadenopathy Musculoskeletal: positive for left foot swelling and bruising  Blood pressure (!) 170/69, pulse 69, temperature 99 F (37.2 C), temperature source Oral, resp. rate 18, height 5\' 6"  (1.676 m), weight 98 kg, SpO2 98 %. Physical Exam Vitals signs reviewed.  Constitutional:      Appearance: He is well-developed.  HENT:     Head: Normocephalic and atraumatic.  Eyes:     Pupils: Pupils are equal, round, and reactive to light.  Neck:     Musculoskeletal: Normal range of motion.  Cardiovascular:     Rate and Rhythm: Normal rate and regular rhythm.  Pulmonary:     Effort: Pulmonary effort is normal.  Breath sounds: Normal breath sounds.  Abdominal:     Palpations: Abdomen is soft.  Musculoskeletal:     Right lower leg: He exhibits no tenderness.     Left lower leg: He exhibits no tenderness. Edema present.     Comments: Left foot with bruising across dorsal forefoot  Lymphadenopathy:     Head:     Right side of head: No submandibular adenopathy.     Left side of head: No submandibular adenopathy.     Cervical: No cervical adenopathy.     Upper Body:     Right upper body: No supraclavicular or axillary adenopathy.     Left upper body: No supraclavicular or axillary adenopathy.     Lower Body: Right inguinal adenopathy present. Left inguinal  adenopathy present.     Comments: Right inguinal node, more superficial 3-3.5cm  Skin:    General: Skin is warm and dry.  Neurological:     General: No focal deficit present.     Mental Status: He is alert and oriented to person, place, and time.  Psychiatric:        Mood and Affect: Mood normal.        Behavior: Behavior normal.     Results: Results for orders placed or performed during the hospital encounter of 04/17/19 (from the past 48 hour(s))  Urinalysis, Routine w reflex microscopic     Status: Abnormal   Collection Time: 04/17/19  7:45 PM  Result Value Ref Range   Color, Urine YELLOW YELLOW   APPearance CLEAR CLEAR   Specific Gravity, Urine 1.012 1.005 - 1.030   pH 5.0 5.0 - 8.0   Glucose, UA NEGATIVE NEGATIVE mg/dL   Hgb urine dipstick LARGE (A) NEGATIVE   Bilirubin Urine NEGATIVE NEGATIVE   Ketones, ur NEGATIVE NEGATIVE mg/dL   Protein, ur 30 (A) NEGATIVE mg/dL   Nitrite NEGATIVE NEGATIVE   Leukocytes,Ua NEGATIVE NEGATIVE   RBC / HPF >50 (H) 0 - 5 RBC/hpf   WBC, UA 0-5 0 - 5 WBC/hpf   Bacteria, UA NONE SEEN NONE SEEN    Comment: Performed at [facility], [address] [place], [place] [address]  Heparin level (unfractionated)     Status: Abnormal   Collection Time: 04/18/19 12:30 AM  Result Value Ref Range   Heparin Unfractionated 0.20 (L) 0.30 - 0.70 IU/mL    Comment: (NOTE) If heparin results are below expected values, and patient dosage has  been confirmed, suggest follow up testing of antithrombin III levels. Performed at [facility], [address] [place], [place] [address]   CBC     Status: Abnormal   Collection Time: 04/18/19  8:34 AM  Result Value Ref Range   WBC 8.0 4.0 - 10.5 K/uL   RBC 3.86 (L) 4.22 - 5.81 MIL/uL   Hemoglobin 12.2 (L) 13.0 - 17.0 g/dL   HCT 35.9 (L) 39.0 - 52.0 %   MCV 93.0 80.0 - 100.0 fL   MCH 31.6 26.0 - 34.0 pg   MCHC 34.0 30.0 - 36.0 g/dL   RDW 12.5 11.5 - 15.5 %   Platelets 123 (L) 150 - 400 K/uL    Comment:  Immature Platelet Fraction may be clinically indicated, consider ordering this additional test [id number]    nRBC 0.0 0.0 - 0.2 %    Comment: Performed at [facility], [address] [place], [place] [address]  Comprehensive metabolic panel     Status: Abnormal   Collection Time: 04/18/19  8:34 AM  Result Value Ref Range   Sodium 140 135 - 145 mmol/L   Potassium 4.0 3.5 - 5.1 mmol/L   Chloride 108 98 - 111 mmol/L   CO2 23 22 - 32 mmol/L   Glucose, Bld 108 (H) 70 - 99 mg/dL   BUN 36 (H) 8 - 23 mg/dL   Creatinine, Ser 1.98 (H) 0.61 - 1.24 mg/dL   Calcium 8.9 8.9 - 10.3 mg/dL   Total Protein 5.8 (L) 6.5 - 8.1 g/dL   Albumin 3.1 (L) 3.5 - 5.0 g/dL   AST 14 (L) 15 - 41 U/L   ALT 13 0 - 44 U/L   Alkaline Phosphatase 67 38 - 126 U/L   Total Bilirubin 0.6 0.3 - 1.2 mg/dL   GFR calc non Af Amer 35 (L) >60 mL/min   GFR calc Af Amer 40 (L) >60 mL/min   Anion gap 9 5 - 15    Comment: Performed at [facility], [address] [place], [place] [address]  D-dimer, quantitative (not at [facility])     Status: Abnormal   Collection Time: 04/18/19  8:34 AM  Result Value Ref Range   D-Dimer, Quant 19.72 (H) 0.00 - 0.50 ug/mL-FEU    Comment: (NOTE) At the manufacturer cut-off of 0.50 ug/mL FEU, this assay has been documented to exclude PE with a sensitivity and negative predictive value of 97 to 99%.  At this time, this assay has not been approved by the FDA to exclude DVT/VTE. Results should be correlated with clinical presentation. Performed at [facility], [address], [place], [place] [address]   C-reactive protein     Status: Abnormal   Collection Time: 04/18/19  8:34 AM  Result Value Ref Range   CRP 12.6 (H) <1.0 mg/dL    Comment: Performed at [facility], [address] [place], [place] [address]  Heparin level (unfractionated)     Status: None   Collection Time: 04/18/19  8:34 AM  Result Value Ref Range   Heparin Unfractionated 0.41 0.30 - 0.70 IU/mL    Comment: (NOTE) If heparin  results are below expected values, and patient dosage has  been confirmed, suggest follow up testing of antithrombin III levels. Performed at [facility], [address], [place], [place] [address]   Heparin level (unfractionated)     Status: None   Collection Time: 04/18/19  4:08 PM  Result Value Ref Range   Heparin Unfractionated 0.35 0.30 - 0.70 IU/mL    Comment: (NOTE) If heparin results are below expected values, and patient dosage has  been confirmed, suggest follow up testing of antithrombin III levels. Performed at [facility], [address] [place], [place] [address]   Heparin level (unfractionated)     Status: Abnormal   Collection Time: 04/19/19  5:55 AM  Result Value Ref Range   Heparin Unfractionated 0.28 (L) 0.30 - 0.70 IU/mL    Comment: (NOTE) If heparin results are below expected values, and patient dosage has  been confirmed, suggest follow up testing of antithrombin III levels. Performed at [facility], [address], [place], [place] [address]   CBC     Status: Abnormal   Collection Time: 04/19/19  5:55 AM  Result Value Ref Range   WBC 7.4 4.0 - 10.5 K/uL   RBC 3.75 (L) 4.22 - 5.81 MIL/uL   Hemoglobin 11.8 (L) 13.0 - 17.0 g/dL   HCT 35.3 (L) 39.0 - 52.0 %   MCV 94.1 80.0 - 100.0 fL   MCH 31.5  26.0 - 34.0 pg   MCHC 33.4 30.0 - 36.0 g/dL   RDW 12.3 11.5 - 15.5 %   Platelets 134 (L) 150 - 400 K/uL   nRBC 0.0 0.0 - 0.2 %    Comment: Performed at [facility], [address], [place], [place] [address]  Lactate dehydrogenase     Status: None   Collection Time: 04/19/19  5:55 AM  Result Value Ref Range   LDH 145 98 - 192 U/L    Comment: Performed at [facility], [address], [place], [place] [address]  Uric acid     Status: Abnormal   Collection Time: 04/19/19  5:55 AM  Result Value Ref Range   Uric Acid, Serum 9.4 (H) 3.7 - 8.6 mg/dL    Comment: Performed at [facility], [address] [place],  [address]   Personally reviewed- abdominal mass  noted, right and left inguinal nodes, right more superficial and 3.5cm measurement  Ct Abdomen Pelvis Wo Contrast  Result Date: 04/18/2019 CLINICAL DATA:  Non pulsatile abdominal mass. Hydronephrosis. Bilateral lower extremity DVTs. EXAM: CT ABDOMEN AND PELVIS WITHOUT CONTRAST TECHNIQUE: Multidetector CT imaging of the abdomen and pelvis was performed following the standard protocol without IV contrast. COMPARISON:  Chest CT earlier this day. FINDINGS: Lower chest: Patchy ground-glass airspace disease as seen on chest CT earlier this day. Small left pleural effusions/thickening. Hepatobiliary: No evidence of focal hepatic lesion, limited evaluation due to lack of IV contrast. Question of nodular hepatic contours. Gallbladder physiologically distended, no calcified stone. No biliary dilatation, however distal common bile duct not well-defined. Pancreas: Large soft tissue mass in the central abdomen involves the body, possibly neck of the pancreas. No distal ductal dilatation. No definite frank peripancreatic inflammation. Spleen: Normal in size without focal abnormality. Spleen measures 10.4 cm. Adrenals/Urinary Tract: Low-density 18 mm left adrenal nodule is most consistent with adenoma. Inferior aspect of the adrenal gland is poorly defined may be contiguous with the large central abdominal mass. The right adrenal gland is normal. Mild to moderate hydronephrosis. Right perinephric edema and thickening of the perirenal fascia. Possible thickening of the right proximal ureter and renal pelvis, for example series 2, image 35. Distal ureter is ill-defined. Fullness of the left renal collecting system without definitive hydronephrosis. There is left perinephric edema and thickening of the perirenal fascia. Ill-defined density in the region of the left ureter, not well assessed in the absence of contrast. Urinary bladder is minimally distended but thick walled. There is perivesicular edema. Stomach/Bowel:  Nondistended stomach. Central abdominal mass abutting the fourth portion of the duodenum and proximal jejunum. Evidence of gastric outlet obstruction. Mass travels along the root of the mesentery. No bowel obstruction. Administered enteric contrast opacifies normal ileal small bowel loops and colon. Normal appendix. Vascular/Lymphatic: Bulky right external iliac adenopathy, nodal conglomerate measuring 5.4 x 3.8 cm, series 2, image 75. There are enlarged right inguinal nodes measuring up to 2.3 cm short axis. Prominent enlarged left inguinal lymph nodes measuring up to 2.4 cm. Right pelvic sidewall adenopathy is well-defined in the absence of IV contrast. Central abdominal mass at the root of the mesentery may represent a large nodal conglomerate, exact measurements are difficult due to the ill-defined shape, however this measures approximately 12 x 7.2 x 11.2 cm. Additional enlarged adjacent nodes in the mesentery. Presumed bilateral retroperitoneal adenopathy, left periaortic ill-defined node series 2, image 30. Enlarged lymph nodes at the root of the celiac axis, porta hepatis, and small nodules in the right  perinephric space superiorly. Advanced aortic atherosclerosis. No evidence of aortic aneurysm. Reproductive: Prostate is unremarkable. Other: Fat in both inguinal canals. Large central mesenteric mass as described above has surrounding edema and soft tissue stranding. Bilateral retroperitoneal stranding. No definite ascites. No free air. Patchy edema in the right lower anterior abdominal wall is nonspecific. 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 No focal bone lesion. Intervertebral disc calcifications noted in the lower thoracic spine. IMPRESSION: 1. Dominant soft tissue mass in the proximal mesentery measuring up to 12 cm. There is also a multifocal adenopathy throughout the abdomen and pelvis. In conjunction with thoracic adenopathy, findings  highly suspicious for lymphoproliferative disorder such as lymphoma. Metastatic disease is also considered. Enlarged inguinal nodes (right greater than left) may be an easily accessible option for tissue sampling. 2. Right hydronephrosis and fullness of the left renal collecting system, this may be secondary to mass effect from central abdominal mass or adenopathy. There is bilateral proximal ureteral thickening. 3. Possible nodular hepatic contours, raising concern for cirrhosis. Recommend correlation for cirrhotic risk factors. 4. Advanced aortic atherosclerosis. Aortic Atherosclerosis (ICD10-I70.0). Electronically Signed   By: Keith Rake M.D.   On: 04/18/2019 23:23   Dg Chest 2 View  Result Date: 04/18/2019 CLINICAL DATA:  DVT.  Acute respiratory failure with hypoxia. EXAM: CHEST - 2 VIEW COMPARISON:  04/17/2019; bilateral lower extremity venous Doppler ultrasound-earlier same day FINDINGS: Grossly unchanged enlarged cardiac silhouette and mediastinal contours with nodular prominence of the bilateral pulmonary hila. Atherosclerotic plaque within the thoracic aorta. There is persistent thickening the right paratracheal stripe presumably secondary prominent vasculature. Ill-defined heterogeneous potential airspace opacities within the right mid and left lower lung are unchanged. Mild cephalization of flow without frank evidence of edema. No pleural effusion or pneumothorax. No evidence of edema. IMPRESSION: 1. Grossly unchanged ill-defined heterogeneous opacities within the right upper and left lower lungs, potentially atelectasis or asymmetric pulmonary edema though developing infection could have a similar appearance. Additionally, in the setting of known lower extremity DVT, conceivably pulmonary infarction could have a similar appearance. 2. Redemonstrated prominence of the bilateral pulmonary hila, potentially secondary to prominent pulmonary vasculature though given presence of potential left  inguinal adenopathy (demonstrated on preceding lower extremity Doppler ultrasound), conceivably hilar adenopathy could have a similar appearance. Further evaluation with chest CT (preferably contrast-enhanced) could be performed as clinically indicated. 3. Pulmonary venous congestion without frank evidence of edema. Electronically Signed   By: Sandi Mariscal M.D.   On: 04/18/2019 11:24   Ct Chest Wo Contrast  Result Date: 04/18/2019 CLINICAL DATA:  Shortness of breath upon arrival to ED yesterday. Acute hypoxic respiratory failure. COVID-19 test is negative. EXAM: CT CHEST WITHOUT CONTRAST TECHNIQUE: Multidetector CT imaging of the chest was performed following the standard protocol without IV contrast. COMPARISON:  Two-view chest x-ray 04/18/2019 FINDINGS: Cardiovascular: Heart size is normal. Coronary artery calcifications are present. Atherosclerotic changes are present in the aortic arch and at the great vessel origins. The left vertebral artery originates from the arch. There is no aneurysm. Pulmonary artery size is within normal limits. Mediastinum/Nodes: Subcarinal in bilateral hilar adenopathy is present, left greater than right. Enlarged axillary nodes are present bilaterally. Left axillary node measures 1.7 cm in short axis. Right axillary node measures 1.6 cm in short axis. Subcarinal node measures 2.5 cm in short axis. Lungs/Pleura: Patchy bilateral airspace opacities are present bilaterally this is most prominent in the right upper lobe and superior segment of the  right lower lobe. There is some prominence in the left lower lobe. Left pleural effusion is present. The airspace disease is more central than peripheral. There is no pneumothorax. No significant right-sided pleural effusion is present. Upper Abdomen: Edematous changes are present at the kidneys bilaterally. Cortical lymph nodes are suspected. Musculoskeletal: Vertebral body heights alignment are maintained. No focal lytic or blastic lesions  are present. The ribs are unremarkable. IMPRESSION: 1. Patchy bilateral airspace disease. Findings are most worrisome for infection. 2. Mediastinal and bilateral hilar adenopathy, left greater than right. While this may be related to the acute infectious/inflammatory process, neoplasm is not excluded. There are no calcifications to suggest granulomatous disease. Sarcoid is not excluded. 3. Bilateral axillary adenopathy is also concerning for malignancy. 4.  Aortic Atherosclerosis (ICD10-I70.0). Electronically Signed   By: San Morelle M.D.   On: 04/18/2019 15:47   US Renal  Result Date: 04/18/2019 CLINICAL DATA:  Acute renal insufficiency. EXAM: RENAL / URINARY TRACT ULTRASOUND COMPLETE COMPARISON:  None. FINDINGS: Right Kidney: Renal measurements: 12.6 x 6.3 x 8.4 cm = volume: 347 mL. Mild left hydronephrosis identified. Left Kidney: Renal measurements: 13 x 6.9 x 7.2 cm = volume: 336 mL. Echogenicity within normal limits. No mass or hydronephrosis visualized. Bladder: The bladder is decompressed. Other: There is a complex mass in the right side of the pelvis/right lower quadrant with some internal blood flow. IMPRESSION: 1. There is a complex mass in the right lower quadrant/right pelvis, with internal blood flow. A CT scan may better evaluate. Malignancy not excluded. 2. Mild hydronephrosis associated with the right kidney, age indeterminate. Recommend clinical correlation. Electronically Signed   By: Dorise Bullion III M.D   On: 04/18/2019 12:50   US Venous Img Lower Bilateral  Result Date: 04/18/2019 CLINICAL DATA:  Bilateral lower extremity pain and edema for the past week. Shortness of breath. Patient is on heparin. Evaluate for DVT. EXAM: BILATERAL LOWER EXTREMITY VENOUS DOPPLER ULTRASOUND TECHNIQUE: Gray-scale sonography with graded compression, as well as color Doppler and duplex ultrasound were performed to evaluate the lower extremity deep venous systems from the level of the common  femoral vein and including the common femoral, femoral, profunda femoral, popliteal and calf veins including the posterior tibial, peroneal and gastrocnemius veins when visible. The superficial great saphenous vein was also interrogated. Spectral Doppler was utilized to evaluate flow at rest and with distal augmentation maneuvers in the common femoral, femoral and popliteal veins. COMPARISON:  Nuclear medicine V/Q scan-04/17/2019; chest radiograph-earlier same day FINDINGS: RIGHT LOWER EXTREMITY There is hypoechoic expansile near occlusive thrombus within the right common femoral vein (image 3), extending to involve the saphenofemoral junction (image 9). There is hypoechoic near occlusive thrombus involving the proximal aspect of the right deep femoral vein (image 12). There is hypoechoic occlusive thrombus involving the proximal (image 14) mid (image 17) and distal (image 22) aspects of the right femoral vein. There is hypoechoic nonocclusive thrombus within the right popliteal vein (image 27 extending to involve the right posterior tibial (image 31) and peroneal (image 33) veins. The anterior tibial vein appears patent where imaged. Other Findings:  None. _________________________________________________________ LEFT LOWER EXTREMITY Common Femoral Vein: No evidence of thrombus. Normal compressibility, respiratory phasicity and response to augmentation. Saphenofemoral Junction: No evidence of thrombus. Normal compressibility and flow on color Doppler imaging. Profunda Femoral Vein: No evidence of thrombus. Normal compressibility and flow on color Doppler imaging. Femoral Vein: No evidence of thrombus. Normal compressibility, respiratory phasicity and response to augmentation. Popliteal Vein: No evidence  of thrombus. Normal compressibility, respiratory phasicity and response to augmentation. Calf Veins: There is hypoechoic occlusive expansile thrombus within the left posterior tibial (image 72) as well as the left  peroneal (image 75) veins. The anterior tibial vein appears patent where imaged. Superficial Great Saphenous Vein: No evidence of thrombus. Normal compressibility. Other Findings: There is an approximately 2.8 x 3.2 x 2.7 cm hypoechoic nodule within the left groin which appears to demonstrate internal blood flow and may represent a pathologically enlarged left inguinal lymph node. IMPRESSION: 1. The examination is positive for rather extensive predominantly occlusive right lower extremity DVT extending from the right common femoral vein through the imaged tibial veins. 2. The examination is positive for occlusive DVT involving the left posterior tibial and peroneal veins. 3. Indeterminate approximately 3.2 cm hypoechoic nodule within left groin, indeterminate though could represent a pathologically enlarged lymph node. Further evaluation with contrast-enhanced CT scan could be performed as indicated. Electronically Signed   By: Sandi Mariscal M.D.   On: 04/18/2019 11:19    Assessment & Plan:  LAMOINE MAGALLON is a 64 y.o. male with adenopathy and abdominal mass concerning for lymphoma in the setting of bilateral DVT, concerning presentation for PE despite negative VQ scan, and currently on a heparin gtt. Given the need for tissue diagnosis, I have been asked to biopsy the inguinal node.   -Right inguinal lymph node biopsy, anesthesia choice, relatively superficial in nature, can do local/ versus block if needed; discussed risk of bleeding, infection, inadequate tissue sampling/ no diagnosis, lymph leak  -Will plan for Wednesday for the biopsy -Will place orders for consent and holding heparin gtt tomorrow   All questions were answered to the satisfaction of the patient and family. Discussed with Dr. Manuella Ghazi.  Kevin [name] 04/19/2019, 3:04 PM

## 2019-04-19 NOTE — Progress Notes (Signed)
PROGRESS NOTE    Kevin [name]  SEG:[id number] DOB: 06/12/1956 DOA: 04/17/2019 PCP: Dione Housekeeper, MD   Brief Narrative:  63 year old male with a history of hypertension, remote history of tobacco use who quit 20 years ago, admitted to the hospital with sudden onset of shortness of breath of 1 day duration.  Initial imaging indicated possible pulmonary vascular congestion, mild elevation of BNP and troponin.  He was noted to have significant hypertension on arrival.  He received a dose of Lasix and was admitted for further treatment.  Echocardiogram showed significantly elevated pulmonary artery pressure in the 70s.  D-dimer was elevated greater than 20.  Venous Dopplers of lower extremities showed bilateral DVTs.  Although VQ scan with low probability for PE, and current clinical scenario was felt likely that he did not fact have a pulmonary embolus.  Pulmonology following.  He is currently on intravenous heparin.  Further work-up has shown a possible right lower quadrant abdominal mass.  Further work-up in process.  10/19: Patient has convincing CT findings for lymphoma.  Discussed with pulmonology, oncology as well as general surgery regarding need for excisional biopsy well hospitalized on IV heparin drip.  Plan to have this done hopefully by 10/21 after which point could start to consider planning for discharge.  Assessment & Plan:   Active Problems:   Hypertensive urgency   Acute respiratory failure with hypoxia (HCC)   HTN (hypertension)   Pulmonary hypertension (HCC)   AKI (acute kidney injury) ([place])   Leg DVT (deep venous thromboembolism), acute, bilateral (HCC)   Abdominal mass, RLQ (right lower quadrant)   Hydronephrosis of right kidney   Acute hypoxemic respiratory failure-multifactorial; stable -Secondary to likely PE as well as some volume overload -There is also diffuse of adenopathy with lymphoma  Abdominal mass with suspected lymphoma -Appreciate oncology  evaluation and appreciate general surgery for evaluation for excisional biopsy  Pulmonary hypertension -Related to pulmonary embolism likely  Bilateral lower extremity DVTs -Continue on heparin drip with high suspicion for PE given presenting symptoms -Will require lifelong anticoagulation  AKI-likely obstructive -Baseline 0.6 -Right-sided hydronephrosis noted as well as fullness of left renal collecting system -We will ask urology to evaluate  Hypertensive urgency -Resolving -Continue amlodipine and metoprolol -Hydralazine IV prn   DVT prophylaxis:Heparin infusion Code Status: Full Family Communication: Wife at bedside Disposition Plan: Plan for discharge after excisional biopsy which was discussed with general surgery.   Consultants:   Pulmonology  Spoke with Dr. Delton Coombes on phone  General surgery  Procedures:   None  Antimicrobials:  Anti-infectives (From admission, onward)   None       Subjective: Patient seen and evaluated today with no new acute complaints or concerns. No acute concerns or events noted overnight. He states that he is overall feeling well.  Objective: Vitals:   04/18/19 1430 04/18/19 2146 04/19/19 0516 04/19/19 0749  BP: (!) 153/64 (!) 183/80 (!) 183/70   Pulse: (!) 58 80 66   Resp: 16 18 18    Temp: 98.6 F (37 C) 99.8 F (37.7 C) 98.7 F (37.1 C)   TempSrc: Oral Oral Oral   SpO2: 96% 96% 97% 98%  Weight:      Height:        Intake/Output Summary (Last 24 hours) at 04/19/2019 1306 Last data filed at 04/19/2019 0900 Gross per 24 hour  Intake 418.93 ml  Output --  Net 418.93 ml   Filed Weights   04/17/19 0346 04/17/19 0804  Weight: 95.3 kg 98  kg    Examination:  General exam: Appears calm and comfortable  Respiratory system: Clear to auscultation. Respiratory effort normal. Cardiovascular system: S1 & S2 heard, RRR. No JVD, murmurs, rubs, gallops or clicks. No pedal edema. Gastrointestinal system: Abdomen is  nondistended, soft and nontender. No organomegaly or masses felt. Normal bowel sounds heard. Central nervous system: Alert and oriented. No focal neurological deficits. Extremities: Symmetric 5 x 5 power. Skin: No rashes, lesions or ulcers Psychiatry: Judgement and insight appear normal. Mood & affect appropriate.     Data Reviewed: I have personally reviewed following labs and imaging studies  CBC: Recent Labs  Lab 04/17/19 0349 04/18/19 0834 04/19/19 0555  WBC 10.5 8.0 7.4  NEUTROABS 7.7  --   --   HGB 14.3 12.2* 11.8*  HCT 42.0 35.9* 35.3*  MCV 92.5 93.0 94.1  PLT 134* 123* 151*   Basic Metabolic Panel: Recent Labs  Lab 04/17/19 0349 04/18/19 0834  NA 136 140  K 3.6 4.0  CL 106 108  CO2 19* 23  GLUCOSE 117* 108*  BUN 32* 36*  CREATININE 2.19* 1.98*  CALCIUM 9.9 8.9   GFR: Estimated Creatinine Clearance: 41.3 mL/min (A) (by C-G formula based on SCr of 1.98 mg/dL (H)). Liver Function Tests: Recent Labs  Lab 04/17/19 0349 04/18/19 0834  AST 18 14*  ALT 14 13  ALKPHOS 89 67  BILITOT 0.4 0.6  PROT 7.0 5.8*  ALBUMIN 3.7 3.1*   No results for input(s): LIPASE, AMYLASE in the last 168 hours. No results for input(s): AMMONIA in the last 168 hours. Coagulation Profile: No results for input(s): INR, PROTIME in the last 168 hours. Cardiac Enzymes: No results for input(s): CKTOTAL, CKMB, CKMBINDEX, TROPONINI in the last 168 hours. BNP (last 3 results) No results for input(s): PROBNP in the last 8760 hours. HbA1C: No results for input(s): HGBA1C in the last 72 hours. CBG: No results for input(s): GLUCAP in the last 168 hours. Lipid Profile: Recent Labs    04/17/19 0349  TRIG 181*   Thyroid Function Tests: No results for input(s): TSH, T4TOTAL, FREET4, T3FREE, THYROIDAB in the last 72 hours. Anemia Panel: Recent Labs    04/17/19 0349  FERRITIN 318   Sepsis Labs: Recent Labs  Lab 04/17/19 0349 04/17/19 0420 04/17/19 0544  PROCALCITON <0.10  --    --   LATICACIDVEN  --  1.7 1.5    Recent Results (from the past 240 hour(s))  SARS Coronavirus 2 by RT PCR (hospital order, performed in [facility] hospital lab) Nasopharyngeal Nasopharyngeal Swab     Status: None   Collection Time: 04/17/19  3:49 AM   Specimen: Nasopharyngeal Swab  Result Value Ref Range Status   SARS Coronavirus 2 NEGATIVE NEGATIVE Final    Comment: (NOTE) If result is NEGATIVE SARS-CoV-2 target nucleic acids are NOT DETECTED. The SARS-CoV-2 RNA is generally detectable in upper and lower  respiratory specimens during the acute phase of infection. The lowest  concentration of SARS-CoV-2 viral copies this assay can detect is 250  copies / mL. A negative result does not preclude SARS-CoV-2 infection  and should not be used as the sole basis for treatment or other  patient management decisions.  A negative result may occur with  improper specimen collection / handling, submission of specimen other  than nasopharyngeal swab, presence of viral mutation(s) within the  areas targeted by this assay, and inadequate number of viral copies  (<250 copies / mL). A negative result must be combined with  clinical  observations, patient history, and epidemiological information. If result is POSITIVE SARS-CoV-2 target nucleic acids are DETECTED. The SARS-CoV-2 RNA is generally detectable in upper and lower  respiratory specimens dur ing the acute phase of infection.  Positive  results are indicative of active infection with SARS-CoV-2.  Clinical  correlation with patient history and other diagnostic information is  necessary to determine patient infection status.  Positive results do  not rule out bacterial infection or co-infection with other viruses. If result is PRESUMPTIVE POSTIVE SARS-CoV-2 nucleic acids MAY BE PRESENT.   A presumptive positive result was obtained on the submitted specimen  and confirmed on repeat testing.  While 2019 novel coronavirus  (SARS-CoV-2) nucleic  acids may be present in the submitted sample  additional confirmatory testing may be necessary for epidemiological  and / or clinical management purposes  to differentiate between  SARS-CoV-2 and other Sarbecovirus currently known to infect humans.  If clinically indicated additional testing with an alternate test  methodology [phone]) is advised. The SARS-CoV-2 RNA is generally  detectable in upper and lower respiratory sp ecimens during the acute  phase of infection. The expected result is Negative. Fact Sheet for Patients:  StrictlyIdeas.no Fact Sheet for Healthcare Providers: BankingDealers.co.za This test is not yet approved or cleared by the [place] FDA and has been authorized for detection and/or diagnosis of SARS-CoV-2 by FDA under an Emergency Use Authorization (EUA).  This EUA will remain in effect (meaning this test can be used) for the duration of the COVID-19 declaration under Section 564(b)(1) of the Act, 21 U.S.C. section 360bbb-3(b)(1), unless the authorization is terminated or revoked sooner. Performed at [facility], [address] [place], [place] [address]   Blood Culture (routine x 2)     Status: None (Preliminary result)   Collection Time: 04/17/19  4:20 AM   Specimen: Right Antecubital; Blood  Result Value Ref Range Status   Specimen Description RIGHT ANTECUBITAL  Final   Special Requests   Final    BOTTLES DRAWN AEROBIC AND ANAEROBIC Blood Culture adequate volume   Culture   Final    NO GROWTH 2 DAYS Performed at [facility], [address], [place], [place] [address]    Report Status PENDING  Incomplete  Blood Culture (routine x 2)     Status: None (Preliminary result)   Collection Time: 04/17/19  4:30 AM   Specimen: Left Antecubital; Blood  Result Value Ref Range Status   Specimen Description LEFT ANTECUBITAL  Final   Special Requests   Final    BOTTLES DRAWN AEROBIC AND ANAEROBIC Blood Culture  adequate volume   Culture   Final    NO GROWTH 2 DAYS Performed at [facility], [address] [place], [place] [address]    Report Status PENDING  Incomplete         Radiology Studies: Ct Abdomen Pelvis Wo Contrast  Result Date: 04/18/2019 CLINICAL DATA:  Non pulsatile abdominal mass. Hydronephrosis. Bilateral lower extremity DVTs. EXAM: CT ABDOMEN AND PELVIS WITHOUT CONTRAST TECHNIQUE: Multidetector CT imaging of the abdomen and pelvis was performed following the standard protocol without IV contrast. COMPARISON:  Chest CT earlier this day. FINDINGS: Lower chest: Patchy ground-glass airspace disease as seen on chest CT earlier this day. Small left pleural effusions/thickening. Hepatobiliary: No evidence of focal hepatic lesion, limited evaluation due to lack of IV contrast. Question of nodular hepatic contours. Gallbladder physiologically distended, no calcified stone. No biliary dilatation, however distal common bile duct not well-defined. Pancreas: Large  soft tissue mass in the central abdomen involves the body, possibly neck of the pancreas. No distal ductal dilatation. No definite frank peripancreatic inflammation. Spleen: Normal in size without focal abnormality. Spleen measures 10.4 cm. Adrenals/Urinary Tract: Low-density 18 mm left adrenal nodule is most consistent with adenoma. Inferior aspect of the adrenal gland is poorly defined may be contiguous with the large central abdominal mass. The right adrenal gland is normal. Mild to moderate hydronephrosis. Right perinephric edema and thickening of the perirenal fascia. Possible thickening of the right proximal ureter and renal pelvis, for example series 2, image 35. Distal ureter is ill-defined. Fullness of the left renal collecting system without definitive hydronephrosis. There is left perinephric edema and thickening of the perirenal fascia. Ill-defined density in the region of the left ureter, not well assessed in the absence of  contrast. Urinary bladder is minimally distended but thick walled. There is perivesicular edema. Stomach/Bowel: Nondistended stomach. Central abdominal mass abutting the fourth portion of the duodenum and proximal jejunum. Evidence of gastric outlet obstruction. Mass travels along the root of the mesentery. No bowel obstruction. Administered enteric contrast opacifies normal ileal small bowel loops and colon. Normal appendix. Vascular/Lymphatic: Bulky right external iliac adenopathy, nodal conglomerate measuring 5.4 x 3.8 cm, series 2, image 75. There are enlarged right inguinal nodes measuring up to 2.3 cm short axis. Prominent enlarged left inguinal lymph nodes measuring up to 2.4 cm. Right pelvic sidewall adenopathy is well-defined in the absence of IV contrast. Central abdominal mass at the root of the mesentery may represent a large nodal conglomerate, exact measurements are difficult due to the ill-defined shape, however this measures approximately 12 x 7.2 x 11.2 cm. Additional enlarged adjacent nodes in the mesentery. Presumed bilateral retroperitoneal adenopathy, left periaortic ill-defined node series 2, image 30. Enlarged lymph nodes at the root of the celiac axis, porta hepatis, and small nodules in the right perinephric space superiorly. Advanced aortic atherosclerosis. No evidence of aortic aneurysm. Reproductive: Prostate is unremarkable. Other: Fat in both inguinal canals. Large central mesenteric mass as described above has surrounding edema and soft tissue stranding. Bilateral retroperitoneal stranding. No definite ascites. No free air. Patchy edema in the right lower anterior abdominal wall is nonspecific. 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 No focal bone lesion. Intervertebral disc calcifications noted in the lower thoracic spine. IMPRESSION: 1. Dominant soft tissue mass in the proximal mesentery measuring up to 12 cm. There is  also a multifocal adenopathy throughout the abdomen and pelvis. In conjunction with thoracic adenopathy, findings highly suspicious for lymphoproliferative disorder such as lymphoma. Metastatic disease is also considered. Enlarged inguinal nodes (right greater than left) may be an easily accessible option for tissue sampling. 2. Right hydronephrosis and fullness of the left renal collecting system, this may be secondary to mass effect from central abdominal mass or adenopathy. There is bilateral proximal ureteral thickening. 3. Possible nodular hepatic contours, raising concern for cirrhosis. Recommend correlation for cirrhotic risk factors. 4. Advanced aortic atherosclerosis. Aortic Atherosclerosis (ICD10-I70.0). Electronically Signed   By: Keith Rake M.D.   On: 04/18/2019 23:23   Dg Chest 2 View  Result Date: 04/18/2019 CLINICAL DATA:  DVT.  Acute respiratory failure with hypoxia. EXAM: CHEST - 2 VIEW COMPARISON:  04/17/2019; bilateral lower extremity venous Doppler ultrasound-earlier same day FINDINGS: Grossly unchanged enlarged cardiac silhouette and mediastinal contours with nodular prominence of the bilateral pulmonary hila. Atherosclerotic plaque within the thoracic aorta. There is persistent thickening the right  paratracheal stripe presumably secondary prominent vasculature. Ill-defined heterogeneous potential airspace opacities within the right mid and left lower lung are unchanged. Mild cephalization of flow without frank evidence of edema. No pleural effusion or pneumothorax. No evidence of edema. IMPRESSION: 1. Grossly unchanged ill-defined heterogeneous opacities within the right upper and left lower lungs, potentially atelectasis or asymmetric pulmonary edema though developing infection could have a similar appearance. Additionally, in the setting of known lower extremity DVT, conceivably pulmonary infarction could have a similar appearance. 2. Redemonstrated prominence of the bilateral  pulmonary hila, potentially secondary to prominent pulmonary vasculature though given presence of potential left inguinal adenopathy (demonstrated on preceding lower extremity Doppler ultrasound), conceivably hilar adenopathy could have a similar appearance. Further evaluation with chest CT (preferably contrast-enhanced) could be performed as clinically indicated. 3. Pulmonary venous congestion without frank evidence of edema. Electronically Signed   By: Sandi Mariscal M.D.   On: 04/18/2019 11:24   Ct Chest Wo Contrast  Result Date: 04/18/2019 CLINICAL DATA:  Shortness of breath upon arrival to ED yesterday. Acute hypoxic respiratory failure. COVID-19 test is negative. EXAM: CT CHEST WITHOUT CONTRAST TECHNIQUE: Multidetector CT imaging of the chest was performed following the standard protocol without IV contrast. COMPARISON:  Two-view chest x-ray 04/18/2019 FINDINGS: Cardiovascular: Heart size is normal. Coronary artery calcifications are present. Atherosclerotic changes are present in the aortic arch and at the great vessel origins. The left vertebral artery originates from the arch. There is no aneurysm. Pulmonary artery size is within normal limits. Mediastinum/Nodes: Subcarinal in bilateral hilar adenopathy is present, left greater than right. Enlarged axillary nodes are present bilaterally. Left axillary node measures 1.7 cm in short axis. Right axillary node measures 1.6 cm in short axis. Subcarinal node measures 2.5 cm in short axis. Lungs/Pleura: Patchy bilateral airspace opacities are present bilaterally this is most prominent in the right upper lobe and superior segment of the right lower lobe. There is some prominence in the left lower lobe. Left pleural effusion is present. The airspace disease is more central than peripheral. There is no pneumothorax. No significant right-sided pleural effusion is present. Upper Abdomen: Edematous changes are present at the kidneys bilaterally. Cortical lymph nodes  are suspected. Musculoskeletal: Vertebral body heights alignment are maintained. No focal lytic or blastic lesions are present. The ribs are unremarkable. IMPRESSION: 1. Patchy bilateral airspace disease. Findings are most worrisome for infection. 2. Mediastinal and bilateral hilar adenopathy, left greater than right. While this may be related to the acute infectious/inflammatory process, neoplasm is not excluded. There are no calcifications to suggest granulomatous disease. Sarcoid is not excluded. 3. Bilateral axillary adenopathy is also concerning for malignancy. 4.  Aortic Atherosclerosis (ICD10-I70.0). Electronically Signed   By: San Morelle M.D.   On: 04/18/2019 15:47   US Renal  Result Date: 04/18/2019 CLINICAL DATA:  Acute renal insufficiency. EXAM: RENAL / URINARY TRACT ULTRASOUND COMPLETE COMPARISON:  None. FINDINGS: Right Kidney: Renal measurements: 12.6 x 6.3 x 8.4 cm = volume: 347 mL. Mild left hydronephrosis identified. Left Kidney: Renal measurements: 13 x 6.9 x 7.2 cm = volume: 336 mL. Echogenicity within normal limits. No mass or hydronephrosis visualized. Bladder: The bladder is decompressed. Other: There is a complex mass in the right side of the pelvis/right lower quadrant with some internal blood flow. IMPRESSION: 1. There is a complex mass in the right lower quadrant/right pelvis, with internal blood flow. A CT scan may better evaluate. Malignancy not excluded. 2. Mild hydronephrosis associated with the right kidney, age indeterminate. Recommend  clinical correlation. Electronically Signed   By: Dorise Bullion III M.D   On: 04/18/2019 12:50   US Venous Img Lower Bilateral  Result Date: 04/18/2019 CLINICAL DATA:  Bilateral lower extremity pain and edema for the past week. Shortness of breath. Patient is on heparin. Evaluate for DVT. EXAM: BILATERAL LOWER EXTREMITY VENOUS DOPPLER ULTRASOUND TECHNIQUE: Gray-scale sonography with graded compression, as well as color Doppler and  duplex ultrasound were performed to evaluate the lower extremity deep venous systems from the level of the common femoral vein and including the common femoral, femoral, profunda femoral, popliteal and calf veins including the posterior tibial, peroneal and gastrocnemius veins when visible. The superficial great saphenous vein was also interrogated. Spectral Doppler was utilized to evaluate flow at rest and with distal augmentation maneuvers in the common femoral, femoral and popliteal veins. COMPARISON:  Nuclear medicine V/Q scan-04/17/2019; chest radiograph-earlier same day FINDINGS: RIGHT LOWER EXTREMITY There is hypoechoic expansile near occlusive thrombus within the right common femoral vein (image 3), extending to involve the saphenofemoral junction (image 9). There is hypoechoic near occlusive thrombus involving the proximal aspect of the right deep femoral vein (image 12). There is hypoechoic occlusive thrombus involving the proximal (image 14) mid (image 17) and distal (image 22) aspects of the right femoral vein. There is hypoechoic nonocclusive thrombus within the right popliteal vein (image 27 extending to involve the right posterior tibial (image 31) and peroneal (image 33) veins. The anterior tibial vein appears patent where imaged. Other Findings:  None. _________________________________________________________ LEFT LOWER EXTREMITY Common Femoral Vein: No evidence of thrombus. Normal compressibility, respiratory phasicity and response to augmentation. Saphenofemoral Junction: No evidence of thrombus. Normal compressibility and flow on color Doppler imaging. Profunda Femoral Vein: No evidence of thrombus. Normal compressibility and flow on color Doppler imaging. Femoral Vein: No evidence of thrombus. Normal compressibility, respiratory phasicity and response to augmentation. Popliteal Vein: No evidence of thrombus. Normal compressibility, respiratory phasicity and response to augmentation. Calf Veins:  There is hypoechoic occlusive expansile thrombus within the left posterior tibial (image 72) as well as the left peroneal (image 75) veins. The anterior tibial vein appears patent where imaged. Superficial Great Saphenous Vein: No evidence of thrombus. Normal compressibility. Other Findings: There is an approximately 2.8 x 3.2 x 2.7 cm hypoechoic nodule within the left groin which appears to demonstrate internal blood flow and may represent a pathologically enlarged left inguinal lymph node. IMPRESSION: 1. The examination is positive for rather extensive predominantly occlusive right lower extremity DVT extending from the right common femoral vein through the imaged tibial veins. 2. The examination is positive for occlusive DVT involving the left posterior tibial and peroneal veins. 3. Indeterminate approximately 3.2 cm hypoechoic nodule within left groin, indeterminate though could represent a pathologically enlarged lymph node. Further evaluation with contrast-enhanced CT scan could be performed as indicated. Electronically Signed   By: Sandi Mariscal M.D.   On: 04/18/2019 11:19        Scheduled Meds:  amLODipine  10 mg Oral Daily   mouth rinse  15 mL Mouth Rinse BID   metoprolol tartrate  25 mg Oral BID   sodium chloride flush  3 mL Intravenous Q12H   Continuous Infusions:  sodium chloride     heparin 1,750 Units/hr (04/19/19 0937)     LOS: 2 days    Time spent: 30 minutes    Jojo Geving Darleen Crocker, DO [facility] Pager [phone]  If 7PM-7AM, please contact night-coverage [email or web address] Password [email or web address] 04/19/2019, 1:06 PM

## 2019-04-19 NOTE — Progress Notes (Addendum)
ANTICOAGULATION CONSULT NOTE - Follow-Up Consult  Pharmacy Consult for Heparin Indication: VTE treatment  No Known Allergies  Patient Measurements: Height: 5\' 6"  (167.6 cm) Weight: 216 lb 0.8 oz (98 kg) IBW/kg (Calculated) : 63.8 HEPARIN DW (KG): 85.2  Vital Signs: Temp: 99 F (37.2 C) (10/19 1310) Temp Source: Oral (10/19 1310) BP: 170/69 (10/19 1310) Pulse Rate: 69 (10/19 1310)  Labs: Recent Labs    04/17/19 0349 04/17/19 0544  04/18/19 0834 04/18/19 1608 04/19/19 0555 04/19/19 1457  HGB 14.3  --   --  12.2*  --  11.8*  --   HCT 42.0  --   --  35.9*  --  35.3*  --   PLT 134*  --   --  123*  --  134*  --   HEPARINUNFRC  --   --    < > 0.41 0.35 0.28* 0.36  CREATININE 2.19*  --   --  1.98*  --   --   --   TROPONINIHS 45* 55*  --   --   --   --   --    < > = values in this interval not displayed.    Estimated Creatinine Clearance: 41.3 mL/min (A) (by C-G formula based on SCr of 1.98 mg/dL (H)).   Medical History: Past Medical History:  Diagnosis Date  . Hypertension     Medications:  Medications Prior to Admission  Medication Sig Dispense Refill Last Dose  . amLODipine (NORVASC) 10 MG tablet Take 10 mg by mouth daily.   04/16/2019 at Unknown time  . metoprolol succinate (TOPROL-XL) 50 MG 24 hr tablet Take 50 mg by mouth daily.   04/16/2019 at 0800    Assessment: Patient presented with SOB. Patient has significant elevated D-dimer . VQ scan negative for PE. Bilateral  Lower extremitiy DVTs and concern for PE. HL therapeutic at 0.36  Goal of Therapy:  Heparin level 0.3-0.7 units/ml Monitor platelets by anticoagulation protocol: Yes   Plan:  Continue heparin infusion at 1750 units/hr Check anti-Xa level in ~6-8 hours daily while on heparin Continue to monitor H&H and platelets  Margot Ables, PharmD Clinical Pharmacist 04/19/2019 5:08 PM

## 2019-04-19 NOTE — Progress Notes (Signed)
Paged Urology, Dr. Diona Fanti 5 times through pager listed on Amion at [phone] with no response. I have sent text and numeric pages and have also contacted his office to reach out to him with no response.

## 2019-04-19 NOTE — Progress Notes (Signed)
Subjective: He says he feels okay.  We discussed the results of his CT chest and CT abdomen and pelvis.  Objective: Vital signs in last 24 hours: Temp:  [98.6 F (37 C)-99.8 F (37.7 C)] 98.7 F (37.1 C) (10/19 0516) Pulse Rate:  [58-80] 66 (10/19 0516) Resp:  [16-18] 18 (10/19 0516) BP: (153-183)/(64-80) 183/70 (10/19 0516) SpO2:  [96 %-98 %] 98 % (10/19 0749) Weight change:  Last BM Date: 04/17/19  Intake/Output from previous day: 10/18 0701 - 10/19 0700 In: 178.9 [I.V.:178.9] Out: -   PHYSICAL EXAM General appearance: alert, cooperative and no distress Resp: clear to auscultation bilaterally Cardio: regular rate and rhythm, S1, S2 normal, no murmur, click, rub or gallop GI: soft, non-tender; bowel sounds normal; no masses,  no organomegaly Extremities: extremities normal, atraumatic, no cyanosis or edema He has palpable lymph nodes in his axillary area and in his left groin  Lab Results:  Results for orders placed or performed during the hospital encounter of 04/17/19 (from the past 48 hour(s))  Urinalysis, Routine w reflex microscopic     Status: Abnormal   Collection Time: 04/17/19  7:45 PM  Result Value Ref Range   Color, Urine YELLOW YELLOW   APPearance CLEAR CLEAR   Specific Gravity, Urine 1.012 1.005 - 1.030   pH 5.0 5.0 - 8.0   Glucose, UA NEGATIVE NEGATIVE mg/dL   Hgb urine dipstick LARGE (A) NEGATIVE   Bilirubin Urine NEGATIVE NEGATIVE   Ketones, ur NEGATIVE NEGATIVE mg/dL   Protein, ur 30 (A) NEGATIVE mg/dL   Nitrite NEGATIVE NEGATIVE   Leukocytes,Ua NEGATIVE NEGATIVE   RBC / HPF >50 (H) 0 - 5 RBC/hpf   WBC, UA 0-5 0 - 5 WBC/hpf   Bacteria, UA NONE SEEN NONE SEEN    Comment: Performed at [facility], [address], [place], [place] [address]  Heparin level (unfractionated)     Status: Abnormal   Collection Time: 04/18/19 12:30 AM  Result Value Ref Range   Heparin Unfractionated 0.20 (L) 0.30 - 0.70 IU/mL    Comment: (NOTE) If heparin results are  below expected values, and patient dosage has  been confirmed, suggest follow up testing of antithrombin III levels. Performed at [facility], [address], [place], [place] [address]   CBC     Status: Abnormal   Collection Time: 04/18/19  8:34 AM  Result Value Ref Range   WBC 8.0 4.0 - 10.5 K/uL   RBC 3.86 (L) 4.22 - 5.81 MIL/uL   Hemoglobin 12.2 (L) 13.0 - 17.0 g/dL   HCT 35.9 (L) 39.0 - 52.0 %   MCV 93.0 80.0 - 100.0 fL   MCH 31.6 26.0 - 34.0 pg   MCHC 34.0 30.0 - 36.0 g/dL   RDW 12.5 11.5 - 15.5 %   Platelets 123 (L) 150 - 400 K/uL    Comment: Immature Platelet Fraction may be clinically indicated, consider ordering this additional test [id number]    nRBC 0.0 0.0 - 0.2 %    Comment: Performed at [facility], [address], [place], [place] [address]  Comprehensive metabolic panel     Status: Abnormal   Collection Time: 04/18/19  8:34 AM  Result Value Ref Range   Sodium 140 135 - 145 mmol/L   Potassium 4.0 3.5 - 5.1 mmol/L   Chloride 108 98 - 111 mmol/L   CO2 23 22 - 32 mmol/L   Glucose, Bld 108 (H) 70 - 99 mg/dL   BUN 36 (H) 8 - 23  mg/dL   Creatinine, Ser 1.98 (H) 0.61 - 1.24 mg/dL   Calcium 8.9 8.9 - 10.3 mg/dL   Total Protein 5.8 (L) 6.5 - 8.1 g/dL   Albumin 3.1 (L) 3.5 - 5.0 g/dL   AST 14 (L) 15 - 41 U/L   ALT 13 0 - 44 U/L   Alkaline Phosphatase 67 38 - 126 U/L   Total Bilirubin 0.6 0.3 - 1.2 mg/dL   GFR calc non Af Amer 35 (L) >60 mL/min   GFR calc Af Amer 40 (L) >60 mL/min   Anion gap 9 5 - 15    Comment: Performed at [facility], [address], [place], [place] [address]  D-dimer, quantitative (not at [facility])     Status: Abnormal   Collection Time: 04/18/19  8:34 AM  Result Value Ref Range   D-Dimer, Quant 19.72 (H) 0.00 - 0.50 ug/mL-FEU    Comment: (NOTE) At the manufacturer cut-off of 0.50 ug/mL FEU, this assay has been documented to exclude PE with a sensitivity and negative predictive value of 97 to 99%.  At this time, this assay has not been  approved by the FDA to exclude DVT/VTE. Results should be correlated with clinical presentation. Performed at [facility], [address], [place], [place] [address]   C-reactive protein     Status: Abnormal   Collection Time: 04/18/19  8:34 AM  Result Value Ref Range   CRP 12.6 (H) <1.0 mg/dL    Comment: Performed at [facility], [address], [place], [place] [address]  Heparin level (unfractionated)     Status: None   Collection Time: 04/18/19  8:34 AM  Result Value Ref Range   Heparin Unfractionated 0.41 0.30 - 0.70 IU/mL    Comment: (NOTE) If heparin results are below expected values, and patient dosage has  been confirmed, suggest follow up testing of antithrombin III levels. Performed at [facility], [address] [place], [place] [address]   Heparin level (unfractionated)     Status: None   Collection Time: 04/18/19  4:08 PM  Result Value Ref Range   Heparin Unfractionated 0.35 0.30 - 0.70 IU/mL    Comment: (NOTE) If heparin results are below expected values, and patient dosage has  been confirmed, suggest follow up testing of antithrombin III levels. Performed at [facility], [address] [place], [place] [address]   Heparin level (unfractionated)     Status: Abnormal   Collection Time: 04/19/19  5:55 AM  Result Value Ref Range   Heparin Unfractionated 0.28 (L) 0.30 - 0.70 IU/mL    Comment: (NOTE) If heparin results are below expected values, and patient dosage has  been confirmed, suggest follow up testing of antithrombin III levels. Performed at [facility], [address], [place], [place] [address]   CBC     Status: Abnormal   Collection Time: 04/19/19  5:55 AM  Result Value Ref Range   WBC 7.4 4.0 - 10.5 K/uL   RBC 3.75 (L) 4.22 - 5.81 MIL/uL   Hemoglobin 11.8 (L) 13.0 - 17.0 g/dL   HCT 35.3 (L) 39.0 - 52.0 %   MCV 94.1 80.0 - 100.0 fL   MCH 31.5 26.0 - 34.0 pg   MCHC 33.4 30.0 - 36.0 g/dL   RDW 12.3 11.5 - 15.5 %   Platelets 134 (L) 150 - 400 K/uL    nRBC 0.0 0.0 - 0.2 %    Comment: Performed at [facility], [address], [place], [place] [address]  ABGS No results for input(s): PHART, PO2ART, TCO2, HCO3 in the last 72 hours.  Invalid input(s): PCO2 CULTURES Recent Results (from the past 240 hour(s))  SARS Coronavirus 2 by RT PCR (hospital order, performed in [facility] hospital lab) Nasopharyngeal Nasopharyngeal Swab     Status: None   Collection Time: 04/17/19  3:49 AM   Specimen: Nasopharyngeal Swab  Result Value Ref Range Status   SARS Coronavirus 2 NEGATIVE NEGATIVE Final    Comment: (NOTE) If result is NEGATIVE SARS-CoV-2 target nucleic acids are NOT DETECTED. The SARS-CoV-2 RNA is generally detectable in upper and lower  respiratory specimens during the acute phase of infection. The lowest  concentration of SARS-CoV-2 viral copies this assay can detect is 250  copies / mL. A negative result does not preclude SARS-CoV-2 infection  and should not be used as the sole basis for treatment or other  patient management decisions.  A negative result may occur with  improper specimen collection / handling, submission of specimen other  than nasopharyngeal swab, presence of viral mutation(s) within the  areas targeted by this assay, and inadequate number of viral copies  (<250 copies / mL). A negative result must be combined with clinical  observations, patient history, and epidemiological information. If result is POSITIVE SARS-CoV-2 target nucleic acids are DETECTED. The SARS-CoV-2 RNA is generally detectable in upper and lower  respiratory specimens dur ing the acute phase of infection.  Positive  results are indicative of active infection with SARS-CoV-2.  Clinical  correlation with patient history and other diagnostic information is  necessary to determine patient infection status.  Positive results do  not rule out bacterial infection or co-infection with other viruses. If result is PRESUMPTIVE POSTIVE SARS-CoV-2  nucleic acids MAY BE PRESENT.   A presumptive positive result was obtained on the submitted specimen  and confirmed on repeat testing.  While 2019 novel coronavirus  (SARS-CoV-2) nucleic acids may be present in the submitted sample  additional confirmatory testing may be necessary for epidemiological  and / or clinical management purposes  to differentiate between  SARS-CoV-2 and other Sarbecovirus currently known to infect humans.  If clinically indicated additional testing with an alternate test  methodology [phone]) is advised. The SARS-CoV-2 RNA is generally  detectable in upper and lower respiratory sp ecimens during the acute  phase of infection. The expected result is Negative. Fact Sheet for Patients:  StrictlyIdeas.no Fact Sheet for Healthcare Providers: BankingDealers.co.za This test is not yet approved or cleared by the [place] FDA and has been authorized for detection and/or diagnosis of SARS-CoV-2 by FDA under an Emergency Use Authorization (EUA).  This EUA will remain in effect (meaning this test can be used) for the duration of the COVID-19 declaration under Section 564(b)(1) of the Act, 21 U.S.C. section 360bbb-3(b)(1), unless the authorization is terminated or revoked sooner. Performed at [facility], [address] [place], [place] [address]   Blood Culture (routine x 2)     Status: None (Preliminary result)   Collection Time: 04/17/19  4:20 AM   Specimen: Right Antecubital; Blood  Result Value Ref Range Status   Specimen Description RIGHT ANTECUBITAL  Final   Special Requests   Final    BOTTLES DRAWN AEROBIC AND ANAEROBIC Blood Culture adequate volume   Culture   Final    NO GROWTH 2 DAYS Performed at [facility], [address] [place], [place] [address]    Report Status PENDING  Incomplete  Blood Culture (routine x 2)  Status: None (Preliminary result)   Collection Time: 04/17/19  4:30 AM   Specimen:  Left Antecubital; Blood  Result Value Ref Range Status   Specimen Description LEFT ANTECUBITAL  Final   Special Requests   Final    BOTTLES DRAWN AEROBIC AND ANAEROBIC Blood Culture adequate volume   Culture   Final    NO GROWTH 2 DAYS Performed at [facility], [address], [place], [place] [address]    Report Status PENDING  Incomplete   Studies/Results: Ct Abdomen Pelvis Wo Contrast  Result Date: 04/18/2019 CLINICAL DATA:  Non pulsatile abdominal mass. Hydronephrosis. Bilateral lower extremity DVTs. EXAM: CT ABDOMEN AND PELVIS WITHOUT CONTRAST TECHNIQUE: Multidetector CT imaging of the abdomen and pelvis was performed following the standard protocol without IV contrast. COMPARISON:  Chest CT earlier this day. FINDINGS: Lower chest: Patchy ground-glass airspace disease as seen on chest CT earlier this day. Small left pleural effusions/thickening. Hepatobiliary: No evidence of focal hepatic lesion, limited evaluation due to lack of IV contrast. Question of nodular hepatic contours. Gallbladder physiologically distended, no calcified stone. No biliary dilatation, however distal common bile duct not well-defined. Pancreas: Large soft tissue mass in the central abdomen involves the body, possibly neck of the pancreas. No distal ductal dilatation. No definite frank peripancreatic inflammation. Spleen: Normal in size without focal abnormality. Spleen measures 10.4 cm. Adrenals/Urinary Tract: Low-density 18 mm left adrenal nodule is most consistent with adenoma. Inferior aspect of the adrenal gland is poorly defined may be contiguous with the large central abdominal mass. The right adrenal gland is normal. Mild to moderate hydronephrosis. Right perinephric edema and thickening of the perirenal fascia. Possible thickening of the right proximal ureter and renal pelvis, for example series 2, image 35. Distal ureter is ill-defined. Fullness of the left renal collecting system without definitive  hydronephrosis. There is left perinephric edema and thickening of the perirenal fascia. Ill-defined density in the region of the left ureter, not well assessed in the absence of contrast. Urinary bladder is minimally distended but thick walled. There is perivesicular edema. Stomach/Bowel: Nondistended stomach. Central abdominal mass abutting the fourth portion of the duodenum and proximal jejunum. Evidence of gastric outlet obstruction. Mass travels along the root of the mesentery. No bowel obstruction. Administered enteric contrast opacifies normal ileal small bowel loops and colon. Normal appendix. Vascular/Lymphatic: Bulky right external iliac adenopathy, nodal conglomerate measuring 5.4 x 3.8 cm, series 2, image 75. There are enlarged right inguinal nodes measuring up to 2.3 cm short axis. Prominent enlarged left inguinal lymph nodes measuring up to 2.4 cm. Right pelvic sidewall adenopathy is well-defined in the absence of IV contrast. Central abdominal mass at the root of the mesentery may represent a large nodal conglomerate, exact measurements are difficult due to the ill-defined shape, however this measures approximately 12 x 7.2 x 11.2 cm. Additional enlarged adjacent nodes in the mesentery. Presumed bilateral retroperitoneal adenopathy, left periaortic ill-defined node series 2, image 30. Enlarged lymph nodes at the root of the celiac axis, porta hepatis, and small nodules in the right perinephric space superiorly. Advanced aortic atherosclerosis. No evidence of aortic aneurysm. Reproductive: Prostate is unremarkable. Other: Fat in both inguinal canals. Large central mesenteric mass as described above has surrounding edema and soft tissue stranding. Bilateral retroperitoneal stranding. No definite ascites. No free air. Patchy edema in the right lower anterior abdominal wall is nonspecific. 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 No focal bone  lesion. Intervertebral disc calcifications noted  in the lower thoracic spine. IMPRESSION: 1. Dominant soft tissue mass in the proximal mesentery measuring up to 12 cm. There is also a multifocal adenopathy throughout the abdomen and pelvis. In conjunction with thoracic adenopathy, findings highly suspicious for lymphoproliferative disorder such as lymphoma. Metastatic disease is also considered. Enlarged inguinal nodes (right greater than left) may be an easily accessible option for tissue sampling. 2. Right hydronephrosis and fullness of the left renal collecting system, this may be secondary to mass effect from central abdominal mass or adenopathy. There is bilateral proximal ureteral thickening. 3. Possible nodular hepatic contours, raising concern for cirrhosis. Recommend correlation for cirrhotic risk factors. 4. Advanced aortic atherosclerosis. Aortic Atherosclerosis (ICD10-I70.0). Electronically Signed   By: Keith Rake M.D.   On: 04/18/2019 23:23   Dg Chest 2 View  Result Date: 04/18/2019 CLINICAL DATA:  DVT.  Acute respiratory failure with hypoxia. EXAM: CHEST - 2 VIEW COMPARISON:  04/17/2019; bilateral lower extremity venous Doppler ultrasound-earlier same day FINDINGS: Grossly unchanged enlarged cardiac silhouette and mediastinal contours with nodular prominence of the bilateral pulmonary hila. Atherosclerotic plaque within the thoracic aorta. There is persistent thickening the right paratracheal stripe presumably secondary prominent vasculature. Ill-defined heterogeneous potential airspace opacities within the right mid and left lower lung are unchanged. Mild cephalization of flow without frank evidence of edema. No pleural effusion or pneumothorax. No evidence of edema. IMPRESSION: 1. Grossly unchanged ill-defined heterogeneous opacities within the right upper and left lower lungs, potentially atelectasis or asymmetric pulmonary edema though developing infection could have a similar  appearance. Additionally, in the setting of known lower extremity DVT, conceivably pulmonary infarction could have a similar appearance. 2. Redemonstrated prominence of the bilateral pulmonary hila, potentially secondary to prominent pulmonary vasculature though given presence of potential left inguinal adenopathy (demonstrated on preceding lower extremity Doppler ultrasound), conceivably hilar adenopathy could have a similar appearance. Further evaluation with chest CT (preferably contrast-enhanced) could be performed as clinically indicated. 3. Pulmonary venous congestion without frank evidence of edema. Electronically Signed   By: Sandi Mariscal M.D.   On: 04/18/2019 11:24   Ct Chest Wo Contrast  Result Date: 04/18/2019 CLINICAL DATA:  Shortness of breath upon arrival to ED yesterday. Acute hypoxic respiratory failure. COVID-19 test is negative. EXAM: CT CHEST WITHOUT CONTRAST TECHNIQUE: Multidetector CT imaging of the chest was performed following the standard protocol without IV contrast. COMPARISON:  Two-view chest x-ray 04/18/2019 FINDINGS: Cardiovascular: Heart size is normal. Coronary artery calcifications are present. Atherosclerotic changes are present in the aortic arch and at the great vessel origins. The left vertebral artery originates from the arch. There is no aneurysm. Pulmonary artery size is within normal limits. Mediastinum/Nodes: Subcarinal in bilateral hilar adenopathy is present, left greater than right. Enlarged axillary nodes are present bilaterally. Left axillary node measures 1.7 cm in short axis. Right axillary node measures 1.6 cm in short axis. Subcarinal node measures 2.5 cm in short axis. Lungs/Pleura: Patchy bilateral airspace opacities are present bilaterally this is most prominent in the right upper lobe and superior segment of the right lower lobe. There is some prominence in the left lower lobe. Left pleural effusion is present. The airspace disease is more central than  peripheral. There is no pneumothorax. No significant right-sided pleural effusion is present. Upper Abdomen: Edematous changes are present at the kidneys bilaterally. Cortical lymph nodes are suspected. Musculoskeletal: Vertebral body heights alignment are maintained. No focal lytic or blastic lesions are present. The ribs are unremarkable. IMPRESSION: 1. Patchy bilateral airspace disease. Findings  are most worrisome for infection. 2. Mediastinal and bilateral hilar adenopathy, left greater than right. While this may be related to the acute infectious/inflammatory process, neoplasm is not excluded. There are no calcifications to suggest granulomatous disease. Sarcoid is not excluded. 3. Bilateral axillary adenopathy is also concerning for malignancy. 4.  Aortic Atherosclerosis (ICD10-I70.0). Electronically Signed   By: San Morelle M.D.   On: 04/18/2019 15:47   US Renal  Result Date: 04/18/2019 CLINICAL DATA:  Acute renal insufficiency. EXAM: RENAL / URINARY TRACT ULTRASOUND COMPLETE COMPARISON:  None. FINDINGS: Right Kidney: Renal measurements: 12.6 x 6.3 x 8.4 cm = volume: 347 mL. Mild left hydronephrosis identified. Left Kidney: Renal measurements: 13 x 6.9 x 7.2 cm = volume: 336 mL. Echogenicity within normal limits. No mass or hydronephrosis visualized. Bladder: The bladder is decompressed. Other: There is a complex mass in the right side of the pelvis/right lower quadrant with some internal blood flow. IMPRESSION: 1. There is a complex mass in the right lower quadrant/right pelvis, with internal blood flow. A CT scan may better evaluate. Malignancy not excluded. 2. Mild hydronephrosis associated with the right kidney, age indeterminate. Recommend clinical correlation. Electronically Signed   By: Dorise Bullion III M.D   On: 04/18/2019 12:50   Nm Pulmonary Vent And Perf (v/q Scan)  Result Date: 04/17/2019 CLINICAL DATA:  Shortness of breath since 3 o'clock this morning. Evaluate for pulmonary  embolism. EXAM: NUCLEAR MEDICINE VENTILATION - PERFUSION LUNG SCAN TECHNIQUE: Ventilation images were obtained in multiple projections using inhaled aerosol Tc-70m DTPA. Perfusion images were obtained in multiple projections after intravenous injection of Tc-76m MAA. RADIOPHARMACEUTICALS:  41 mCi of Tc-34m DTPA aerosol inhalation and 1.5 mCi Tc25m MAA IV COMPARISON:  Chest radiograph-earlier same day FINDINGS: Review of chest radiograph performed earlier same day demonstrates ill-defined heterogeneous opacities within the right upper and left lower lung. Ventilation: Ventilatory images demonstrate clumping of inhaled radiotracer about the bilateral pulmonary hila. There is an ill-defined area of decreased ventilation involving the peripheral aspect of the right upper lung regional to the expected location of the potential developing airspace opacity demonstrated on preceding chest radiograph. No additional areas of non ventilation are identified. Ingested radiotracer is seen within the esophagus and stomach. Perfusion: There is a matched area of decreased perfusion involving the peripheral aspect of the right upper lung correlating with the potential developing airspace opacity seen on preceding chest radiograph. Otherwise, there is relative homogeneous perfusion of the bilateral pulmonary parenchyma without definitive mismatched perfusion defect. IMPRESSION: Very low probability of pulmonary embolism - triple matched defect involving the peripheral aspect of the right upper lung corresponding with the potential developing airspace opacity questioned on preceding chest radiograph. Electronically Signed   By: Sandi Mariscal M.D.   On: 04/17/2019 11:16   US Venous Img Lower Bilateral  Result Date: 04/18/2019 CLINICAL DATA:  Bilateral lower extremity pain and edema for the past week. Shortness of breath. Patient is on heparin. Evaluate for DVT. EXAM: BILATERAL LOWER EXTREMITY VENOUS DOPPLER ULTRASOUND TECHNIQUE:  Gray-scale sonography with graded compression, as well as color Doppler and duplex ultrasound were performed to evaluate the lower extremity deep venous systems from the level of the common femoral vein and including the common femoral, femoral, profunda femoral, popliteal and calf veins including the posterior tibial, peroneal and gastrocnemius veins when visible. The superficial great saphenous vein was also interrogated. Spectral Doppler was utilized to evaluate flow at rest and with distal augmentation maneuvers in the common femoral, femoral and popliteal veins.  COMPARISON:  Nuclear medicine V/Q scan-04/17/2019; chest radiograph-earlier same day FINDINGS: RIGHT LOWER EXTREMITY There is hypoechoic expansile near occlusive thrombus within the right common femoral vein (image 3), extending to involve the saphenofemoral junction (image 9). There is hypoechoic near occlusive thrombus involving the proximal aspect of the right deep femoral vein (image 12). There is hypoechoic occlusive thrombus involving the proximal (image 14) mid (image 17) and distal (image 22) aspects of the right femoral vein. There is hypoechoic nonocclusive thrombus within the right popliteal vein (image 27 extending to involve the right posterior tibial (image 31) and peroneal (image 33) veins. The anterior tibial vein appears patent where imaged. Other Findings:  None. _________________________________________________________ LEFT LOWER EXTREMITY Common Femoral Vein: No evidence of thrombus. Normal compressibility, respiratory phasicity and response to augmentation. Saphenofemoral Junction: No evidence of thrombus. Normal compressibility and flow on color Doppler imaging. Profunda Femoral Vein: No evidence of thrombus. Normal compressibility and flow on color Doppler imaging. Femoral Vein: No evidence of thrombus. Normal compressibility, respiratory phasicity and response to augmentation. Popliteal Vein: No evidence of thrombus. Normal  compressibility, respiratory phasicity and response to augmentation. Calf Veins: There is hypoechoic occlusive expansile thrombus within the left posterior tibial (image 72) as well as the left peroneal (image 75) veins. The anterior tibial vein appears patent where imaged. Superficial Great Saphenous Vein: No evidence of thrombus. Normal compressibility. Other Findings: There is an approximately 2.8 x 3.2 x 2.7 cm hypoechoic nodule within the left groin which appears to demonstrate internal blood flow and may represent a pathologically enlarged left inguinal lymph node. IMPRESSION: 1. The examination is positive for rather extensive predominantly occlusive right lower extremity DVT extending from the right common femoral vein through the imaged tibial veins. 2. The examination is positive for occlusive DVT involving the left posterior tibial and peroneal veins. 3. Indeterminate approximately 3.2 cm hypoechoic nodule within left groin, indeterminate though could represent a pathologically enlarged lymph node. Further evaluation with contrast-enhanced CT scan could be performed as indicated. Electronically Signed   By: Sandi Mariscal M.D.   On: 04/18/2019 11:19    Medications:  Prior to Admission:  Medications Prior to Admission  Medication Sig Dispense Refill Last Dose  . amLODipine (NORVASC) 10 MG tablet Take 10 mg by mouth daily.   04/16/2019 at Unknown time  . metoprolol succinate (TOPROL-XL) 50 MG 24 hr tablet Take 50 mg by mouth daily.   04/16/2019 at 0800   Scheduled: . amLODipine  10 mg Oral Daily  . heparin  1,000 Units Intravenous Once  . mouth rinse  15 mL Mouth Rinse BID  . metoprolol tartrate  25 mg Oral BID  . sodium chloride flush  3 mL Intravenous Q12H   Continuous: . sodium chloride    . heparin 1,600 Units/hr (04/19/19 0118)   OJJ:[id number] chloride, acetaminophen **OR** acetaminophen, hydrALAZINE, ondansetron **OR** ondansetron (ZOFRAN) IV, oxyCODONE-acetaminophen, sodium chloride  flush  Assesment: He was admitted with hypertensive urgency and acute hypoxic respiratory failure.  He had pulmonary hypertension on echocardiogram.  He was treated with Lasix and improved but remained hypoxic.  He did not have any known lung disease.  He was started on heparin because of concerns that he might have pulmonary emboli.  He had ventilation/perfusion lung scan that was low probability but his venous Doppler was positive for clot.  He had abnormal chest x-ray which led to him having noncontrasted CT of the chest which showed extensive adenopathy.  He had CT abdomen and pelvis as well which  also shows extensive adenopathy and there is concern that he might have something like lymphoma. Active Problems:   Hypertensive urgency   Acute respiratory failure with hypoxia (HCC)   HTN (hypertension)   Pulmonary hypertension (HCC)   AKI (acute kidney injury) ([place])   Leg DVT (deep venous thromboembolism), acute, bilateral (HCC)   Abdominal mass, RLQ (right lower quadrant)   Hydronephrosis of right kidney    Plan: Continue treatments.  Discussed with Dr. Manuella Ghazi.  I would continue heparin for now with plans to do some sort of biopsy and then switch him to one of the novel anticoagulants.    LOS: 2 days   Kevin [name] 04/19/2019, 8:35 AM

## 2019-04-20 LAB — BASIC METABOLIC PANEL
Anion gap: 9 (ref 5–15)
BUN: 31 mg/dL — ABNORMAL HIGH (ref 8–23)
CO2: 26 mmol/L (ref 22–32)
Calcium: 8.7 mg/dL — ABNORMAL LOW (ref 8.9–10.3)
Chloride: 108 mmol/L (ref 98–111)
Creatinine, Ser: 1.96 mg/dL — ABNORMAL HIGH (ref 0.61–1.24)
GFR calc Af Amer: 41 mL/min — ABNORMAL LOW (ref >60)
GFR calc non Af Amer: 35 mL/min — ABNORMAL LOW (ref >60)
Glucose, Bld: 96 mg/dL (ref 70–99)
Potassium: 4 mmol/L (ref 3.5–5.1)
Sodium: 143 mmol/L (ref 135–145)

## 2019-04-20 LAB — CBC
HCT: 33.7 % — ABNORMAL LOW (ref 39.0–52.0)
Hemoglobin: 11.2 g/dL — ABNORMAL LOW (ref 13.0–17.0)
MCH: 31.5 pg (ref 26.0–34.0)
MCHC: 33.2 g/dL (ref 30.0–36.0)
MCV: 94.9 fL (ref 80.0–100.0)
Platelets: 147 10*3/uL — ABNORMAL LOW (ref 150–400)
RBC: 3.55 MIL/uL — ABNORMAL LOW (ref 4.22–5.81)
RDW: 12.1 % (ref 11.5–15.5)
WBC: 6.5 10*3/uL (ref 4.0–10.5)
nRBC: 0 % (ref 0.0–0.2)

## 2019-04-20 LAB — MRSA PCR SCREENING: MRSA by PCR: NEGATIVE

## 2019-04-20 LAB — PROTIME-INR
INR: 1.1 (ref 0.8–1.2)
Prothrombin Time: 14 seconds (ref 11.4–15.2)

## 2019-04-20 LAB — HEPARIN LEVEL (UNFRACTIONATED): Heparin Unfractionated: 0.42 IU/mL (ref 0.30–0.70)

## 2019-04-20 MED ORDER — CHLORHEXIDINE GLUCONATE CLOTH 2 % EX PADS: 6.0000 | MEDICATED_PAD | Freq: Once | CUTANEOUS | Status: DC

## 2019-04-20 MED ORDER — CEFAZOLIN SODIUM-DEXTROSE 2-4 GM/100ML-% IV SOLN
2.0000 g | INTRAVENOUS | Status: AC
  Administered 2019-04-21: 08:00:00 2 g via INTRAVENOUS
  Filled 2019-04-20: qty 100

## 2019-04-20 MED ORDER — CHLORHEXIDINE GLUCONATE CLOTH 2 % EX PADS
6.0000 | MEDICATED_PAD | Freq: Once | CUTANEOUS | Status: AC
  Administered 2019-04-20: 6 via TOPICAL

## 2019-04-20 NOTE — Progress Notes (Signed)
PROGRESS NOTE    Kevin [name]  ZOX:[id number] DOB: 10/21/1955 DOA: 04/17/2019 PCP: Dione Housekeeper, MD   Brief Narrative:  63 year old male with a history of hypertension, remote history of tobacco use who quit 20 years ago, admitted to the hospital with sudden onset of shortness of breath of 1 day duration. Initial imaging indicated possible pulmonary vascular congestion, mild elevation of BNP and troponin. He was noted to have significant hypertension on arrival. He received a dose of Lasix and was admitted for further treatment. Echocardiogram showed significantly elevated pulmonary artery pressure in the 70s. D-dimer was elevated greater than 20. Venous Dopplers of lower extremities showed bilateral DVTs. Although VQ scan with low probability for PE, and current clinical scenario was felt likely that he did not fact have a pulmonary embolus. Pulmonology following. He is currently on intravenous heparin. Further work-up has shown a possible right lower quadrant abdominal mass. Further work-up in process.  10/19: Patient has convincing CT findings for lymphoma.  Discussed with pulmonology, oncology as well as general surgery regarding need for excisional biopsy well hospitalized on IV heparin drip.  Plan to have this done hopefully by 10/21 after which point could start to consider planning for discharge.  10/20: Discussed case with urology today who will plan to see him regarding hydronephrosis later today.  Plans for excisional lymph node biopsy per general surgery in a.m. and will keep n.p.o. after midnight.  No other concerns noted today patient remains on IV heparin drip for the time being.  Assessment & Plan:   Active Problems:   Hypertensive urgency   Acute respiratory failure with hypoxia (HCC)   HTN (hypertension)   Pulmonary hypertension (HCC)   AKI (acute kidney injury) ([place])   Leg DVT (deep venous thromboembolism), acute, bilateral (HCC)   Abdominal mass, RLQ  (right lower quadrant)   Hydronephrosis of right kidney   Inguinal adenopathy   Acute hypoxemic respiratory failure-multifactorial; stable -Secondary to likely PE as well as some volume overload -There is also diffuse adenopathy with findings of lymphoma  Abdominal mass with suspected lymphoma -Appreciate general surgery evaluation with plans for excisional biopsy in a.m.  Pulmonary hypertension -Related to pulmonary embolism likely  Bilateral lower extremity DVTs -Continue on heparin drip with high suspicion for PE given presenting symptoms -Will require lifelong anticoagulation  AKI-likely obstructive -Baseline 0.6 seen on 08/2018 -Right-sided hydronephrosis noted as well as fullness of left renal collecting system -Discussed case with urology Dr. Louis Meckel who will evaluate patient later today  Hypertensive urgency-resolved -Borderline elevation still noted -Continue amlodipine and metoprolol -Hydralazine IV prn   DVT prophylaxis:Heparin infusion will need to be held at 2 AM Code Status: Full Family Communication: Wife at bedside Disposition Plan: Plan for excisional biopsy on 10/21.  Urology evaluation pending.   Consultants:   Pulmonology  Spoke with Dr. Delton Coombes on phone  General surgery  Urology  Procedures:   None  Antimicrobials:  Anti-infectives (From admission, onward)   None       Subjective: Patient seen and evaluated today with no new acute complaints or concerns. No acute concerns or events noted overnight.  Objective: Vitals:   04/19/19 2010 04/19/19 2113 04/20/19 0453 04/20/19 0800  BP:  (!) 173/61 (!) 160/60   Pulse:  72 (!) 51 64  Resp:  18 18   Temp:  98.7 F (37.1 C) 97.6 F (36.4 C)   TempSrc:  Oral Oral   SpO2: 98% 99% 97%   Weight:  Height:        Intake/Output Summary (Last 24 hours) at 04/20/2019 1107 Last data filed at 04/20/2019 0900 Gross per 24 hour  Intake 1111.62 ml  Output --  Net 1111.62 ml    Filed Weights   04/17/19 0346 04/17/19 0804  Weight: 95.3 kg 98 kg    Examination:  General exam: Appears calm and comfortable  Respiratory system: Clear to auscultation. Respiratory effort normal.  Currently on 2 L nasal cannula oxygen. Cardiovascular system: S1 & S2 heard, RRR. No JVD, murmurs, rubs, gallops or clicks. No pedal edema. Gastrointestinal system: Abdomen is nondistended, soft and nontender. No organomegaly or masses felt. Normal bowel sounds heard. Central nervous system: Alert and oriented. No focal neurological deficits. Extremities: Symmetric 5 x 5 power. Skin: No rashes, lesions or ulcers Psychiatry: Judgement and insight appear normal. Mood & affect appropriate.     Data Reviewed: I have personally reviewed following labs and imaging studies  CBC: Recent Labs  Lab 04/17/19 0349 04/18/19 0834 04/19/19 0555 04/20/19 0550  WBC 10.5 8.0 7.4 6.5  NEUTROABS 7.7  --   --   --   HGB 14.3 12.2* 11.8* 11.2*  HCT 42.0 35.9* 35.3* 33.7*  MCV 92.5 93.0 94.1 94.9  PLT 134* 123* 134* 726*   Basic Metabolic Panel: Recent Labs  Lab 04/17/19 0349 04/18/19 0834 04/20/19 0550  NA 136 140 143  K 3.6 4.0 4.0  CL 106 108 108  CO2 19* 23 26  GLUCOSE 117* 108* 96  BUN 32* 36* 31*  CREATININE 2.19* 1.98* 1.96*  CALCIUM 9.9 8.9 8.7*   GFR: Estimated Creatinine Clearance: 41.7 mL/min (A) (by C-G formula based on SCr of 1.96 mg/dL (H)). Liver Function Tests: Recent Labs  Lab 04/17/19 0349 04/18/19 0834  AST 18 14*  ALT 14 13  ALKPHOS 89 67  BILITOT 0.4 0.6  PROT 7.0 5.8*  ALBUMIN 3.7 3.1*   No results for input(s): LIPASE, AMYLASE in the last 168 hours. No results for input(s): AMMONIA in the last 168 hours. Coagulation Profile: Recent Labs  Lab 04/20/19 0827  INR 1.1   Cardiac Enzymes: No results for input(s): CKTOTAL, CKMB, CKMBINDEX, TROPONINI in the last 168 hours. BNP (last 3 results) No results for input(s): PROBNP in the last 8760  hours. HbA1C: No results for input(s): HGBA1C in the last 72 hours. CBG: No results for input(s): GLUCAP in the last 168 hours. Lipid Profile: No results for input(s): CHOL, HDL, LDLCALC, TRIG, CHOLHDL, LDLDIRECT in the last 72 hours. Thyroid Function Tests: No results for input(s): TSH, T4TOTAL, FREET4, T3FREE, THYROIDAB in the last 72 hours. Anemia Panel: No results for input(s): VITAMINB12, FOLATE, FERRITIN, TIBC, IRON, RETICCTPCT in the last 72 hours. Sepsis Labs: Recent Labs  Lab 04/17/19 0349 04/17/19 0420 04/17/19 0544  PROCALCITON <0.10  --   --   LATICACIDVEN  --  1.7 1.5    Recent Results (from the past 240 hour(s))  SARS Coronavirus 2 by RT PCR (hospital order, performed in [facility] hospital lab) Nasopharyngeal Nasopharyngeal Swab     Status: None   Collection Time: 04/17/19  3:49 AM   Specimen: Nasopharyngeal Swab  Result Value Ref Range Status   SARS Coronavirus 2 NEGATIVE NEGATIVE Final    Comment: (NOTE) If result is NEGATIVE SARS-CoV-2 target nucleic acids are NOT DETECTED. The SARS-CoV-2 RNA is generally detectable in upper and lower  respiratory specimens during the acute phase of infection. The lowest  concentration of SARS-CoV-2 viral copies this  assay can detect is 250  copies / mL. A negative result does not preclude SARS-CoV-2 infection  and should not be used as the sole basis for treatment or other  patient management decisions.  A negative result may occur with  improper specimen collection / handling, submission of specimen other  than nasopharyngeal swab, presence of viral mutation(s) within the  areas targeted by this assay, and inadequate number of viral copies  (<250 copies / mL). A negative result must be combined with clinical  observations, patient history, and epidemiological information. If result is POSITIVE SARS-CoV-2 target nucleic acids are DETECTED. The SARS-CoV-2 RNA is generally detectable in upper and lower  respiratory  specimens dur ing the acute phase of infection.  Positive  results are indicative of active infection with SARS-CoV-2.  Clinical  correlation with patient history and other diagnostic information is  necessary to determine patient infection status.  Positive results do  not rule out bacterial infection or co-infection with other viruses. If result is PRESUMPTIVE POSTIVE SARS-CoV-2 nucleic acids MAY BE PRESENT.   A presumptive positive result was obtained on the submitted specimen  and confirmed on repeat testing.  While 2019 novel coronavirus  (SARS-CoV-2) nucleic acids may be present in the submitted sample  additional confirmatory testing may be necessary for epidemiological  and / or clinical management purposes  to differentiate between  SARS-CoV-2 and other Sarbecovirus currently known to infect humans.  If clinically indicated additional testing with an alternate test  methodology [phone]) is advised. The SARS-CoV-2 RNA is generally  detectable in upper and lower respiratory sp ecimens during the acute  phase of infection. The expected result is Negative. Fact Sheet for Patients:  StrictlyIdeas.no Fact Sheet for Healthcare Providers: BankingDealers.co.za This test is not yet approved or cleared by the [place] FDA and has been authorized for detection and/or diagnosis of SARS-CoV-2 by FDA under an Emergency Use Authorization (EUA).  This EUA will remain in effect (meaning this test can be used) for the duration of the COVID-19 declaration under Section 564(b)(1) of the Act, 21 U.S.C. section 360bbb-3(b)(1), unless the authorization is terminated or revoked sooner. Performed at [facility], [address] [place], [place] [address]   Blood Culture (routine x 2)     Status: None (Preliminary result)   Collection Time: 04/17/19  4:20 AM   Specimen: Right Antecubital; Blood  Result Value Ref Range Status   Specimen Description  RIGHT ANTECUBITAL  Final   Special Requests   Final    BOTTLES DRAWN AEROBIC AND ANAEROBIC Blood Culture adequate volume   Culture   Final    NO GROWTH 3 DAYS Performed at [facility], [address], [place], [place] [address]    Report Status PENDING  Incomplete  Blood Culture (routine x 2)     Status: None (Preliminary result)   Collection Time: 04/17/19  4:30 AM   Specimen: Left Antecubital; Blood  Result Value Ref Range Status   Specimen Description LEFT ANTECUBITAL  Final   Special Requests   Final    BOTTLES DRAWN AEROBIC AND ANAEROBIC Blood Culture adequate volume   Culture   Final    NO GROWTH 3 DAYS Performed at [facility], [address], [place], [place] [address]    Report Status PENDING  Incomplete         Radiology Studies: Ct Abdomen Pelvis Wo Contrast  Result Date: 04/18/2019 CLINICAL DATA:  Non pulsatile abdominal mass. Hydronephrosis. Bilateral lower extremity DVTs. EXAM: CT ABDOMEN  AND PELVIS WITHOUT CONTRAST TECHNIQUE: Multidetector CT imaging of the abdomen and pelvis was performed following the standard protocol without IV contrast. COMPARISON:  Chest CT earlier this day. FINDINGS: Lower chest: Patchy ground-glass airspace disease as seen on chest CT earlier this day. Small left pleural effusions/thickening. Hepatobiliary: No evidence of focal hepatic lesion, limited evaluation due to lack of IV contrast. Question of nodular hepatic contours. Gallbladder physiologically distended, no calcified stone. No biliary dilatation, however distal common bile duct not well-defined. Pancreas: Large soft tissue mass in the central abdomen involves the body, possibly neck of the pancreas. No distal ductal dilatation. No definite frank peripancreatic inflammation. Spleen: Normal in size without focal abnormality. Spleen measures 10.4 cm. Adrenals/Urinary Tract: Low-density 18 mm left adrenal nodule is most consistent with adenoma. Inferior aspect of the adrenal gland is poorly  defined may be contiguous with the large central abdominal mass. The right adrenal gland is normal. Mild to moderate hydronephrosis. Right perinephric edema and thickening of the perirenal fascia. Possible thickening of the right proximal ureter and renal pelvis, for example series 2, image 35. Distal ureter is ill-defined. Fullness of the left renal collecting system without definitive hydronephrosis. There is left perinephric edema and thickening of the perirenal fascia. Ill-defined density in the region of the left ureter, not well assessed in the absence of contrast. Urinary bladder is minimally distended but thick walled. There is perivesicular edema. Stomach/Bowel: Nondistended stomach. Central abdominal mass abutting the fourth portion of the duodenum and proximal jejunum. Evidence of gastric outlet obstruction. Mass travels along the root of the mesentery. No bowel obstruction. Administered enteric contrast opacifies normal ileal small bowel loops and colon. Normal appendix. Vascular/Lymphatic: Bulky right external iliac adenopathy, nodal conglomerate measuring 5.4 x 3.8 cm, series 2, image 75. There are enlarged right inguinal nodes measuring up to 2.3 cm short axis. Prominent enlarged left inguinal lymph nodes measuring up to 2.4 cm. Right pelvic sidewall adenopathy is well-defined in the absence of IV contrast. Central abdominal mass at the root of the mesentery may represent a large nodal conglomerate, exact measurements are difficult due to the ill-defined shape, however this measures approximately 12 x 7.2 x 11.2 cm. Additional enlarged adjacent nodes in the mesentery. Presumed bilateral retroperitoneal adenopathy, left periaortic ill-defined node series 2, image 30. Enlarged lymph nodes at the root of the celiac axis, porta hepatis, and small nodules in the right perinephric space superiorly. Advanced aortic atherosclerosis. No evidence of aortic aneurysm. Reproductive: Prostate is unremarkable.  Other: Fat in both inguinal canals. Large central mesenteric mass as described above has surrounding edema and soft tissue stranding. Bilateral retroperitoneal stranding. No definite ascites. No free air. Patchy edema in the right lower anterior abdominal wall is nonspecific. 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 No focal bone lesion. Intervertebral disc calcifications noted in the lower thoracic spine. IMPRESSION: 1. Dominant soft tissue mass in the proximal mesentery measuring up to 12 cm. There is also a multifocal adenopathy throughout the abdomen and pelvis. In conjunction with thoracic adenopathy, findings highly suspicious for lymphoproliferative disorder such as lymphoma. Metastatic disease is also considered. Enlarged inguinal nodes (right greater than left) may be an easily accessible option for tissue sampling. 2. Right hydronephrosis and fullness of the left renal collecting system, this may be secondary to mass effect from central abdominal mass or adenopathy. There is bilateral proximal ureteral thickening. 3. Possible nodular hepatic contours, raising concern for cirrhosis. Recommend correlation for cirrhotic risk factors.  4. Advanced aortic atherosclerosis. Aortic Atherosclerosis (ICD10-I70.0). Electronically Signed   By: Keith Rake M.D.   On: 04/18/2019 23:23   Ct Chest Wo Contrast  Result Date: 04/18/2019 CLINICAL DATA:  Shortness of breath upon arrival to ED yesterday. Acute hypoxic respiratory failure. COVID-19 test is negative. EXAM: CT CHEST WITHOUT CONTRAST TECHNIQUE: Multidetector CT imaging of the chest was performed following the standard protocol without IV contrast. COMPARISON:  Two-view chest x-ray 04/18/2019 FINDINGS: Cardiovascular: Heart size is normal. Coronary artery calcifications are present. Atherosclerotic changes are present in the aortic arch and at the great vessel origins. The left vertebral artery  originates from the arch. There is no aneurysm. Pulmonary artery size is within normal limits. Mediastinum/Nodes: Subcarinal in bilateral hilar adenopathy is present, left greater than right. Enlarged axillary nodes are present bilaterally. Left axillary node measures 1.7 cm in short axis. Right axillary node measures 1.6 cm in short axis. Subcarinal node measures 2.5 cm in short axis. Lungs/Pleura: Patchy bilateral airspace opacities are present bilaterally this is most prominent in the right upper lobe and superior segment of the right lower lobe. There is some prominence in the left lower lobe. Left pleural effusion is present. The airspace disease is more central than peripheral. There is no pneumothorax. No significant right-sided pleural effusion is present. Upper Abdomen: Edematous changes are present at the kidneys bilaterally. Cortical lymph nodes are suspected. Musculoskeletal: Vertebral body heights alignment are maintained. No focal lytic or blastic lesions are present. The ribs are unremarkable. IMPRESSION: 1. Patchy bilateral airspace disease. Findings are most worrisome for infection. 2. Mediastinal and bilateral hilar adenopathy, left greater than right. While this may be related to the acute infectious/inflammatory process, neoplasm is not excluded. There are no calcifications to suggest granulomatous disease. Sarcoid is not excluded. 3. Bilateral axillary adenopathy is also concerning for malignancy. 4.  Aortic Atherosclerosis (ICD10-I70.0). Electronically Signed   By: San Morelle M.D.   On: 04/18/2019 15:47        Scheduled Meds:  amLODipine  10 mg Oral Daily   mouth rinse  15 mL Mouth Rinse BID   metoprolol tartrate  25 mg Oral BID   sodium chloride flush  3 mL Intravenous Q12H   Continuous Infusions:  sodium chloride     heparin 1,750 Units/hr (04/20/19 0814)     LOS: 3 days    Time spent: 30 minutes    Brylon Brenning Darleen Crocker, DO [facility] Pager  [phone]  If 7PM-7AM, please contact night-coverage [email or web address] Password [email or web address] 04/20/2019, 11:07 AM

## 2019-04-20 NOTE — Progress Notes (Signed)
He is scheduled for lymph node biopsy today.  His problem appears to be oncologic and I will plan to follow more peripherally.  Thanks for allowing me to see him with you

## 2019-04-20 NOTE — H&P (View-Only) (Signed)
Urology Consult  Consulting MD: Heath Lark, DO  CC: Hydronephrosis  HPI: This is a 63 year old male recently diagnosed with a large mid abdominal mass impacting the retroperitoneum.  This was diagnosed on CT scan.  He does have a mass-effect on exam.  Additionally, there is right greater than left hydronephrosis.  Baseline creatinine from February 2020 was approximately 0.7.  More recently, it is 1.9 and not responding to IV hydration.  The patient is scheduled for inguinal lymph node biopsy tomorrow by Dr. Constance Haw.  Urologic consultation is requested for consideration of stent placement for his hydronephrosis.  PMH: Past Medical History:  Diagnosis Date  . Hypertension     PSH: History reviewed. No pertinent surgical history.  Allergies: No Known Allergies  Medications: Medications Prior to Admission  Medication Sig Dispense Refill Last Dose  . amLODipine (NORVASC) 10 MG tablet Take 10 mg by mouth daily.   04/16/2019 at Unknown time  . metoprolol succinate (TOPROL-XL) 50 MG 24 hr tablet Take 50 mg by mouth daily.   04/16/2019 at 0800     Social History: Social History   Socioeconomic History  . Marital status: Married    Spouse name: Not on file  . Number of children: Not on file  . Years of education: Not on file  . Highest education level: Not on file  Occupational History  . Not on file  Social Needs  . Financial resource strain: Not on file  . Food insecurity    Worry: Not on file    Inability: Not on file  . Transportation needs    Medical: Not on file    Non-medical: Not on file  Tobacco Use  . Smoking status: Never Smoker  . Smokeless tobacco: Never Used  Substance and Sexual Activity  . Alcohol use: Yes  . Drug use: Never  . Sexual activity: Not on file  Lifestyle  . Physical activity    Days per week: Not on file    Minutes per session: Not on file  . Stress: Not on file  Relationships  . Social Herbalist on phone: Not on file   Gets together: Not on file    Attends religious service: Not on file    Active member of club or organization: Not on file    Attends meetings of clubs or organizations: Not on file    Relationship status: Not on file  . Intimate partner violence    Fear of current or ex partner: Not on file    Emotionally abused: Not on file    Physically abused: Not on file    Forced sexual activity: Not on file  Other Topics Concern  . Not on file  Social History Narrative  . Not on file    Family History: Family History  Problem Relation Age of Onset  . Leukemia Brother     Review of Systems: Positive: Shortness of breath, leg swelling Negative: .  A further 10 point review of systems was negative except what is listed in the HPI.  Physical Exam: @VITALS2 @ General: No acute distress.  Awake. Head:  Normocephalic.  Atraumatic. ENT:  EOMI.  Mucous membranes moist Neck:  Supple.  No lymphadenopathy. CV:  Regular rate. Pulmonary: Equal effort bilaterally.   Skin:  Normal turgor.  No visible rash. Extremity: No gross deformity of upper extremities.  No gross deformity of lower extremities. Neurologic: Alert. Appropriate mood.    Studies:  Recent Labs  04/19/19 0555 04/20/19 0550  HGB 11.8* 11.2*  WBC 7.4 6.5  PLT 134* 147*    Recent Labs    04/18/19 0834 04/20/19 0550  NA 140 143  K 4.0 4.0  CL 108 108  CO2 23 26  BUN 36* 31*  CREATININE 1.98* 1.96*  CALCIUM 8.9 8.7*  GFRNONAA 35* 35*  GFRAA 40* 41*     Recent Labs    04/20/19 0827  INR 1.1     Invalid input(s): ABG  I reviewed the patient's CT images as well as past laboratories.  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  Patient is remarkably asymptomatic but he does have associated mild right greater than left hydronephrosis but he does have significant increase in his creatinine from baseline of 0.7-1.9.  Plan: I have discussed with the patient  proceeding with cystoscopy, bilateral retrogrades and probable placement of double-J stents.  His creatinine bumped to me does not reflect the amount of hydronephrosis, although certainly with his large abdominal mass in the fact that he will be on therapy for some time necessitates retrograde studies and probable stent placement currently.  I spoken with Dr. Constance Haw who will be performing his lymph node biopsy tomorrow.  She is magnanimous enough to move this case up to the first case of the morning which would allow dr. Nicolette Bang to place stents prior to his office.  I spoke with Dr. Alyson Ingles about this, who agrees to proceed.  The patient is also aware of Dr. Alyson Ingles helping with this process.    [phone]

## 2019-04-20 NOTE — Consult Note (Signed)
Urology Consult  Consulting MD: Heath Lark, DO  CC: Hydronephrosis  HPI: This is a 63 year old male recently diagnosed with a large mid abdominal mass impacting the retroperitoneum.  This was diagnosed on CT scan.  He does have a mass-effect on exam.  Additionally, there is right greater than left hydronephrosis.  Baseline creatinine from February 2020 was approximately 0.7.  More recently, it is 1.9 and not responding to IV hydration.  The patient is scheduled for inguinal lymph node biopsy tomorrow by Dr. Constance Haw.  Urologic consultation is requested for consideration of stent placement for his hydronephrosis.  PMH: Past Medical History:  Diagnosis Date  . Hypertension     PSH: History reviewed. No pertinent surgical history.  Allergies: No Known Allergies  Medications: Medications Prior to Admission  Medication Sig Dispense Refill Last Dose  . amLODipine (NORVASC) 10 MG tablet Take 10 mg by mouth daily.   04/16/2019 at Unknown time  . metoprolol succinate (TOPROL-XL) 50 MG 24 hr tablet Take 50 mg by mouth daily.   04/16/2019 at 0800     Social History: Social History   Socioeconomic History  . Marital status: Married    Spouse name: Not on file  . Number of children: Not on file  . Years of education: Not on file  . Highest education level: Not on file  Occupational History  . Not on file  Social Needs  . Financial resource strain: Not on file  . Food insecurity    Worry: Not on file    Inability: Not on file  . Transportation needs    Medical: Not on file    Non-medical: Not on file  Tobacco Use  . Smoking status: Never Smoker  . Smokeless tobacco: Never Used  Substance and Sexual Activity  . Alcohol use: Yes  . Drug use: Never  . Sexual activity: Not on file  Lifestyle  . Physical activity    Days per week: Not on file    Minutes per session: Not on file  . Stress: Not on file  Relationships  . Social Herbalist on phone: Not on file   Gets together: Not on file    Attends religious service: Not on file    Active member of club or organization: Not on file    Attends meetings of clubs or organizations: Not on file    Relationship status: Not on file  . Intimate partner violence    Fear of current or ex partner: Not on file    Emotionally abused: Not on file    Physically abused: Not on file    Forced sexual activity: Not on file  Other Topics Concern  . Not on file  Social History Narrative  . Not on file    Family History: Family History  Problem Relation Age of Onset  . Leukemia Brother     Review of Systems: Positive: Shortness of breath, leg swelling Negative: .  A further 10 point review of systems was negative except what is listed in the HPI.  Physical Exam: @VITALS2 @ General: No acute distress.  Awake. Head:  Normocephalic.  Atraumatic. ENT:  EOMI.  Mucous membranes moist Neck:  Supple.  No lymphadenopathy. CV:  Regular rate. Pulmonary: Equal effort bilaterally.   Skin:  Normal turgor.  No visible rash. Extremity: No gross deformity of upper extremities.  No gross deformity of lower extremities. Neurologic: Alert. Appropriate mood.    Studies:  Recent Labs  04/19/19 0555 04/20/19 0550  HGB 11.8* 11.2*  WBC 7.4 6.5  PLT 134* 147*    Recent Labs    04/18/19 0834 04/20/19 0550  NA 140 143  K 4.0 4.0  CL 108 108  CO2 23 26  BUN 36* 31*  CREATININE 1.98* 1.96*  CALCIUM 8.9 8.7*  GFRNONAA 35* 35*  GFRAA 40* 41*     Recent Labs    04/20/19 0827  INR 1.1     Invalid input(s): ABG  I reviewed the patient's CT images as well as past laboratories.  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  Patient is remarkably asymptomatic but he does have associated mild right greater than left hydronephrosis but he does have significant increase in his creatinine from baseline of 0.7-1.9.  Plan: I have discussed with the patient  proceeding with cystoscopy, bilateral retrogrades and probable placement of double-J stents.  His creatinine bumped to me does not reflect the amount of hydronephrosis, although certainly with his large abdominal mass in the fact that he will be on therapy for some time necessitates retrograde studies and probable stent placement currently.  I spoken with Dr. Constance Haw who will be performing his lymph node biopsy tomorrow.  She is magnanimous enough to move this case up to the first case of the morning which would allow dr. Nicolette Bang to place stents prior to his office.  I spoke with Dr. Alyson Ingles about this, who agrees to proceed.  The patient is also aware of Dr. Alyson Ingles helping with this process.    Pager:[phone]

## 2019-04-20 NOTE — Progress Notes (Signed)
[facility]   Orders placed for OR tomorrow. Hold heparin gtt at 2AM on 04/21/19 as procedure is scheduled for 830AM. Plan for right inguinal node biopsy 04/21/19.   Curlene Labrum, MD [facility] [address] [place], [place] [address] [phone] (office)

## 2019-04-20 NOTE — Progress Notes (Signed)
ANTICOAGULATION CONSULT NOTE - Follow-Up Consult  Pharmacy Consult for Heparin Indication: VTE treatment  No Known Allergies  Patient Measurements: Height: 5\' 6"  (167.6 cm) Weight: 216 lb 0.8 oz (98 kg) IBW/kg (Calculated) : 63.8 HEPARIN DW (KG): 85.2  Vital Signs: Temp: 97.6 F (36.4 C) (10/20 0453) Temp Source: Oral (10/20 0453) BP: 160/60 (10/20 0453) Pulse Rate: 64 (10/20 0800)  Labs: Recent Labs    04/18/19 0834  04/19/19 0555 04/19/19 1457 04/20/19 0550 04/20/19 0827  HGB 12.2*  --  11.8*  --  11.2*  --   HCT 35.9*  --  35.3*  --  33.7*  --   PLT 123*  --  134*  --  147*  --   LABPROT  --   --   --   --   --  14.0  INR  --   --   --   --   --  1.1  HEPARINUNFRC 0.41   < > 0.28* 0.36 0.42  --   CREATININE 1.98*  --   --   --  1.96*  --    < > = values in this interval not displayed.    Estimated Creatinine Clearance: 41.7 mL/min (A) (by C-G formula based on SCr of 1.96 mg/dL (H)).   Medical History: Past Medical History:  Diagnosis Date  . Hypertension     Medications:  Medications Prior to Admission  Medication Sig Dispense Refill Last Dose  . amLODipine (NORVASC) 10 MG tablet Take 10 mg by mouth daily.   04/16/2019 at Unknown time  . metoprolol succinate (TOPROL-XL) 50 MG 24 hr tablet Take 50 mg by mouth daily.   04/16/2019 at 0800    Assessment: Patient presented with SOB. Patient has significantly elevated D-dimer . VQ scan  is negative for PE. Bilateral  Lower extremitiy DVTs and concern for PE.  04-20-19 AM UPDATE:  HL remains  therapeutic at 0.42 IU/mL CBC WNL  Goal of Therapy:  Heparin level 0.3-0.7 units/ml Monitor platelets by anticoagulation protocol: Yes   Plan:  Continue heparin infusion at 1750 units/hr Check anti-Xa level   daily while on heparin Continue to monitor H&H and platelets  Despina Pole, Pharm. D. Clinical Pharmacist 04/20/2019 9:17 AM

## 2019-04-21 ENCOUNTER — Inpatient Hospital Stay (HOSPITAL_COMMUNITY): Payer: BLUE CROSS/BLUE SHIELD | Admitting: Anesthesiology

## 2019-04-21 ENCOUNTER — Encounter (HOSPITAL_COMMUNITY): Payer: Self-pay | Admitting: *Deleted

## 2019-04-21 ENCOUNTER — Inpatient Hospital Stay (HOSPITAL_COMMUNITY): Payer: BLUE CROSS/BLUE SHIELD

## 2019-04-21 ENCOUNTER — Encounter (HOSPITAL_COMMUNITY)
Admission: EM | Disposition: A | Discharge status: Home / Self Care | Payer: Self-pay | Source: Home / Self Care | Attending: Internal Medicine

## 2019-04-21 DIAGNOSIS — I82403 Acute embolism and thrombosis of unspecified deep veins of lower extremity, bilateral: Secondary | ICD-10-CM

## 2019-04-21 DIAGNOSIS — R1903 Right lower quadrant abdominal swelling, mass and lump: Secondary | ICD-10-CM

## 2019-04-21 DIAGNOSIS — N179 Acute kidney failure, unspecified: Secondary | ICD-10-CM

## 2019-04-21 DIAGNOSIS — R590 Localized enlarged lymph nodes: Secondary | ICD-10-CM

## 2019-04-21 HISTORY — PX: INGUINAL LYMPH NODE BIOPSY: SHX5865

## 2019-04-21 HISTORY — PX: CYSTOSCOPY W/ URETERAL STENT PLACEMENT: SHX1429

## 2019-04-21 LAB — CBC
HCT: 40.9 % (ref 39.0–52.0)
Hemoglobin: 13.1 g/dL (ref 13.0–17.0)
MCH: 31 pg (ref 26.0–34.0)
MCHC: 32 g/dL (ref 30.0–36.0)
MCV: 96.7 fL (ref 80.0–100.0)
Platelets: 211 10*3/uL (ref 150–400)
RBC: 4.23 MIL/uL (ref 4.22–5.81)
RDW: 12.2 % (ref 11.5–15.5)
WBC: 7.5 10*3/uL (ref 4.0–10.5)
nRBC: 0 % (ref 0.0–0.2)

## 2019-04-21 LAB — BASIC METABOLIC PANEL
Anion gap: 10 (ref 5–15)
BUN: 30 mg/dL — ABNORMAL HIGH (ref 8–23)
CO2: 28 mmol/L (ref 22–32)
Calcium: 9.7 mg/dL (ref 8.9–10.3)
Chloride: 105 mmol/L (ref 98–111)
Creatinine, Ser: 1.8 mg/dL — ABNORMAL HIGH (ref 0.61–1.24)
GFR calc Af Amer: 45 mL/min — ABNORMAL LOW (ref >60)
GFR calc non Af Amer: 39 mL/min — ABNORMAL LOW (ref >60)
Glucose, Bld: 97 mg/dL (ref 70–99)
Potassium: 3.9 mmol/L (ref 3.5–5.1)
Sodium: 143 mmol/L (ref 135–145)

## 2019-04-21 LAB — HEPARIN LEVEL (UNFRACTIONATED): Heparin Unfractionated: <0.1 IU/mL — ABNORMAL LOW (ref 0.30–0.70)

## 2019-04-21 SURGERY — BIOPSY, LYMPH NODE, INGUINAL, OPEN
Anesthesia: General | Laterality: Right

## 2019-04-21 MED ORDER — OXYBUTYNIN CHLORIDE 5 MG PO TABS
5.0000 mg | ORAL_TABLET | Freq: Once | ORAL | Status: AC
  Administered 2019-04-21: 5 mg via ORAL
  Filled 2019-04-21: qty 1

## 2019-04-21 MED ORDER — HYDROMORPHONE HCL 1 MG/ML IJ SOLN: 0.5000 mg | INTRAMUSCULAR | Status: DC | PRN

## 2019-04-21 MED ORDER — SUCCINYLCHOLINE CHLORIDE 200 MG/10ML IV SOSY
PREFILLED_SYRINGE | INTRAVENOUS | Status: AC
  Filled 2019-04-21: qty 10

## 2019-04-21 MED ORDER — BUPIVACAINE HCL (PF) 0.5 % IJ SOLN
INTRAMUSCULAR | Status: DC | PRN
  Administered 2019-04-21: 7 mL

## 2019-04-21 MED ORDER — DIATRIZOATE MEGLUMINE 30 % UR SOLN
URETHRAL | Status: AC
  Filled 2019-04-21: qty 100

## 2019-04-21 MED ORDER — EPHEDRINE SULFATE 50 MG/ML IJ SOLN
INTRAMUSCULAR | Status: DC | PRN
  Administered 2019-04-21: 10 mg via INTRAVENOUS
  Administered 2019-04-21: 5 mg via INTRAVENOUS

## 2019-04-21 MED ORDER — ONDANSETRON HCL 4 MG/2ML IJ SOLN
INTRAMUSCULAR | Status: AC
  Filled 2019-04-21: qty 2

## 2019-04-21 MED ORDER — PROMETHAZINE HCL 25 MG/ML IJ SOLN: 6.2500 mg | INTRAMUSCULAR | Status: DC | PRN

## 2019-04-21 MED ORDER — SODIUM CHLORIDE 0.9 % IR SOLN
Status: DC | PRN
  Administered 2019-04-21: 3000 mL

## 2019-04-21 MED ORDER — MORPHINE SULFATE (PF) 2 MG/ML IV SOLN
2.0000 mg | INTRAVENOUS | Status: DC | PRN
  Administered 2019-04-21 – 2019-04-22 (×4): 2 mg via INTRAVENOUS
  Filled 2019-04-21 (×4): qty 1

## 2019-04-21 MED ORDER — MIDAZOLAM HCL 2 MG/2ML IJ SOLN: 0.5000 mg | Freq: Once | INTRAMUSCULAR | Status: DC | PRN

## 2019-04-21 MED ORDER — PROPOFOL 10 MG/ML IV BOLUS
INTRAVENOUS | Status: AC
  Filled 2019-04-21: qty 20

## 2019-04-21 MED ORDER — MIDAZOLAM HCL 2 MG/2ML IJ SOLN
INTRAMUSCULAR | Status: AC
  Filled 2019-04-21: qty 2

## 2019-04-21 MED ORDER — SUGAMMADEX SODIUM 200 MG/2ML IV SOLN
INTRAVENOUS | Status: DC | PRN
  Administered 2019-04-21: 196 mg via INTRAVENOUS

## 2019-04-21 MED ORDER — ROCURONIUM BROMIDE 10 MG/ML (PF) SYRINGE
PREFILLED_SYRINGE | INTRAVENOUS | Status: AC
  Filled 2019-04-21: qty 10

## 2019-04-21 MED ORDER — FENTANYL CITRATE (PF) 100 MCG/2ML IJ SOLN: INTRAMUSCULAR | Status: DC | PRN

## 2019-04-21 MED ORDER — BUPIVACAINE HCL (PF) 0.5 % IJ SOLN
INTRAMUSCULAR | Status: AC
  Filled 2019-04-21: qty 30

## 2019-04-21 MED ORDER — LACTATED RINGERS IV SOLN
INTRAVENOUS | Status: DC
  Administered 2019-04-21: 1000 mL via INTRAVENOUS

## 2019-04-21 MED ORDER — PROPOFOL 10 MG/ML IV BOLUS
INTRAVENOUS | Status: DC | PRN
  Administered 2019-04-21: 160 mg via INTRAVENOUS

## 2019-04-21 MED ORDER — ONDANSETRON HCL 4 MG/2ML IJ SOLN
INTRAMUSCULAR | Status: DC | PRN
  Administered 2019-04-21: 4 mg via INTRAVENOUS

## 2019-04-21 MED ORDER — CHLORHEXIDINE GLUCONATE CLOTH 2 % EX PADS
6.0000 | MEDICATED_PAD | Freq: Every day | CUTANEOUS | Status: DC
  Administered 2019-04-21 – 2019-04-22 (×2): 6 via TOPICAL

## 2019-04-21 MED ORDER — DIATRIZOATE MEGLUMINE 30 % UR SOLN
URETHRAL | Status: DC | PRN
  Administered 2019-04-21: 10 mL via URETHRAL

## 2019-04-21 MED ORDER — EPHEDRINE 5 MG/ML INJ
INTRAVENOUS | Status: AC
  Filled 2019-04-21: qty 10

## 2019-04-21 MED ORDER — HEPARIN (PORCINE) 25000 UT/250ML-% IV SOLN
1750.0000 [IU]/h | INTRAVENOUS | Status: DC
  Administered 2019-04-21 – 2019-04-22 (×2): 1750 [IU]/h via INTRAVENOUS
  Filled 2019-04-21: qty 250

## 2019-04-21 MED ORDER — ROCURONIUM BROMIDE 100 MG/10ML IV SOLN
INTRAVENOUS | Status: DC | PRN
  Administered 2019-04-21: 30 mg via INTRAVENOUS

## 2019-04-21 MED ORDER — FENTANYL CITRATE (PF) 100 MCG/2ML IJ SOLN
INTRAMUSCULAR | Status: AC
  Filled 2019-04-21: qty 2

## 2019-04-21 MED ORDER — 0.9 % SODIUM CHLORIDE (POUR BTL) OPTIME
TOPICAL | Status: DC | PRN
  Administered 2019-04-21: 1000 mL

## 2019-04-21 MED ORDER — LIDOCAINE 2% (20 MG/ML) 5 ML SYRINGE
INTRAMUSCULAR | Status: AC
  Filled 2019-04-21: qty 5

## 2019-04-21 MED ORDER — LIDOCAINE HCL (CARDIAC) PF 100 MG/5ML IV SOSY
PREFILLED_SYRINGE | INTRAVENOUS | Status: DC | PRN
  Administered 2019-04-21: 60 mg via INTRAVENOUS

## 2019-04-21 MED ORDER — STERILE WATER FOR IRRIGATION IR SOLN
Status: DC | PRN
  Administered 2019-04-21: 500 mL

## 2019-04-21 MED ORDER — FENTANYL CITRATE (PF) 100 MCG/2ML IJ SOLN
INTRAMUSCULAR | Status: DC | PRN
  Administered 2019-04-21: 25 ug via INTRAVENOUS
  Administered 2019-04-21: 50 ug via INTRAVENOUS
  Administered 2019-04-21: 25 ug via INTRAVENOUS

## 2019-04-21 MED ORDER — SUCCINYLCHOLINE CHLORIDE 20 MG/ML IJ SOLN
INTRAMUSCULAR | Status: DC | PRN
  Administered 2019-04-21: 120 mg via INTRAVENOUS

## 2019-04-21 MED ORDER — HYDROCODONE-ACETAMINOPHEN 7.5-325 MG PO TABS: 1.0000 | ORAL_TABLET | Freq: Once | ORAL | Status: DC | PRN

## 2019-04-21 MED ORDER — HYDROMORPHONE HCL 1 MG/ML IJ SOLN: 0.2500 mg | INTRAMUSCULAR | Status: DC | PRN

## 2019-04-21 SURGICAL SUPPLY — 49 items
ADH SKN CLS APL DERMABOND .7 (GAUZE/BANDAGES/DRESSINGS) ×2
APPLIER CLIP 9.375 SM OPEN (CLIP) ×4
APR CLP SM 9.3 20 MLT OPN (CLIP) ×2
BAG DRAIN URO TABLE W/ADPT NS (BAG) ×4 IMPLANT
BAG DRN 8 ADPR NS SKTRN CSTL (BAG) ×2
CATH FOLEY 2WAY SLVR  5CC 18FR (CATHETERS) ×2
CATH FOLEY 2WAY SLVR 5CC 18FR (CATHETERS) IMPLANT
CATH INTERMIT  6FR 70CM (CATHETERS) ×4 IMPLANT
CLIP APPLIE 9.375 SM OPEN (CLIP) IMPLANT
CLOTH BEACON ORANGE TIMEOUT ST (SAFETY) ×8 IMPLANT
CONT SPEC 4OZ CLIKSEAL STRL BL (MISCELLANEOUS) ×2 IMPLANT
COVER LIGHT HANDLE STERIS (MISCELLANEOUS) ×8 IMPLANT
COVER WAND RF STERILE (DRAPES) ×2 IMPLANT
DECANTER SPIKE VIAL GLASS SM (MISCELLANEOUS) ×8 IMPLANT
DERMABOND ADVANCED (GAUZE/BANDAGES/DRESSINGS) ×2
DERMABOND ADVANCED .7 DNX12 (GAUZE/BANDAGES/DRESSINGS) ×2 IMPLANT
ELECT REM PT RETURN 9FT ADLT (ELECTROSURGICAL) ×4
ELECTRODE REM PT RTRN 9FT ADLT (ELECTROSURGICAL) ×2 IMPLANT
GAUZE SPONGE 4X4 12PLY STRL (GAUZE/BANDAGES/DRESSINGS) ×4 IMPLANT
GLOVE BIO SURGEON STRL SZ 6.5 (GLOVE) ×3 IMPLANT
GLOVE BIO SURGEON STRL SZ8 (GLOVE) ×4 IMPLANT
GLOVE BIO SURGEONS STRL SZ 6.5 (GLOVE) ×1
GLOVE BIOGEL PI IND STRL 6.5 (GLOVE) ×2 IMPLANT
GLOVE BIOGEL PI IND STRL 7.0 (GLOVE) ×8 IMPLANT
GLOVE BIOGEL PI INDICATOR 6.5 (GLOVE) ×2
GLOVE BIOGEL PI INDICATOR 7.0 (GLOVE) ×12
GLOVE ECLIPSE 6.5 STRL STRAW (GLOVE) ×4 IMPLANT
GOWN STRL REUS W/TWL LRG LVL3 (GOWN DISPOSABLE) ×14 IMPLANT
GOWN STRL REUS W/TWL XL LVL3 (GOWN DISPOSABLE) ×4 IMPLANT
GUIDEWIRE STR ZIPWIRE 035X150 (MISCELLANEOUS) ×4 IMPLANT
IV NS IRRIG 3000ML ARTHROMATIC (IV SOLUTION) ×4 IMPLANT
KIT TURNOVER CYSTO (KITS) ×4 IMPLANT
KIT TURNOVER KIT A (KITS) ×4 IMPLANT
MANIFOLD NEPTUNE II (INSTRUMENTS) ×8 IMPLANT
NDL HYPO 25X1 1.5 SAFETY (NEEDLE) ×2 IMPLANT
NEEDLE HYPO 25X1 1.5 SAFETY (NEEDLE) ×4 IMPLANT
NS IRRIG 1000ML POUR BTL (IV SOLUTION) ×4 IMPLANT
PACK CYSTO (CUSTOM PROCEDURE TRAY) ×4 IMPLANT
PACK MINOR (CUSTOM PROCEDURE TRAY) ×4 IMPLANT
PAD ARMBOARD 7.5X6 YLW CONV (MISCELLANEOUS) ×8 IMPLANT
SET BASIN LINEN APH (SET/KITS/TRAYS/PACK) ×4 IMPLANT
SPONGE INTESTINAL PEANUT (DISPOSABLE) ×2 IMPLANT
STENT CONTOUR 7FRX26 (STENTS) ×4 IMPLANT
SUT MNCRL AB 4-0 PS2 18 (SUTURE) ×4 IMPLANT
SUT VIC AB 3-0 SH 27 (SUTURE) ×4
SUT VIC AB 3-0 SH 27X BRD (SUTURE) ×2 IMPLANT
SYR CONTROL 10ML LL (SYRINGE) ×6 IMPLANT
TOWEL OR 17X26 4PK STRL BLUE (TOWEL DISPOSABLE) ×4 IMPLANT
WATER STERILE IRR 500ML POUR (IV SOLUTION) ×4 IMPLANT

## 2019-04-21 NOTE — Anesthesia Procedure Notes (Signed)
Procedure Name: Intubation Date/Time: 04/21/2019 7:52 AM Performed by: Hewitt Blade, CRNA Pre-anesthesia Checklist: Patient identified, Emergency Drugs available, Suction available and Patient being monitored Patient Re-evaluated:Patient Re-evaluated prior to induction Oxygen Delivery Method: Circle system utilized Preoxygenation: Pre-oxygenation with 100% oxygen Induction Type: IV induction, Rapid sequence and Cricoid Pressure applied Laryngoscope Size: Mac and 4 Grade View: Grade I Tube type: Oral Tube size: 7.0 mm Number of attempts: 1 Airway Equipment and Method: Stylet Placement Confirmation: ETT inserted through vocal cords under direct vision,  positive ETCO2 and breath sounds checked- equal and bilateral Secured at: 22 cm Tube secured with: Tape Dental Injury: Teeth and Oropharynx as per pre-operative assessment

## 2019-04-21 NOTE — Progress Notes (Signed)
PROGRESS NOTE    Kevin [name]  KGM:[id number] DOB: [birth year] DOA: 04/17/2019 PCP: Dione Housekeeper, MD   Brief Narrative:  63 year old male with a history of hypertension, remote history of tobacco use who quit 20 years ago, admitted to the hospital with sudden onset of shortness of breath of 1 day duration. Initial imaging indicated possible pulmonary vascular congestion, mild elevation of BNP and troponin. He was noted to have significant hypertension on arrival. He received a dose of Lasix and was admitted for further treatment. Echocardiogram showed significantly elevated pulmonary artery pressure in the 70s. D-dimer was elevated greater than 20. Venous Dopplers of lower extremities showed bilateral DVTs. Although VQ scan with low probability for PE, and current clinical scenario was felt likely that he did not fact have a pulmonary embolus. Pulmonology following. He is currently on intravenous heparin. Further work-up has shown a possible right lower quadrant abdominal mass. Further work-up in process.  10/19:Patient has convincing CT findings for lymphoma. Discussed with pulmonology, oncology as well as general surgery regarding need for excisional biopsy well hospitalized on IV heparin drip. Plan to have this done hopefully by 10/21 after which point could start to consider planning for discharge.  10/20: Discussed case with urology today who will plan to see him regarding hydronephrosis later today.  Plans for excisional lymph node biopsy per general surgery in a.m. and will keep n.p.o. after midnight.  No other concerns noted today patient remains on IV heparin drip for the time being.  10/21: Patient has undergone cystoscopy with bilateral JJ stent placement this morning with urology along with right inguinal lymph node excisional biopsy this morning.  He has tolerated the procedures well with no complications noted.  Foley catheter is now present with some mild hematuria.   Patient is complaining of some ongoing abdominal/suprapubic pain.  Plans to resume heparin drip at 1600.  Assessment & Plan:   Active Problems:   Hypertensive urgency   Acute respiratory failure with hypoxia (HCC)   HTN (hypertension)   Pulmonary hypertension (HCC)   AKI (acute kidney injury) ([place])   Leg DVT (deep venous thromboembolism), acute, bilateral (HCC)   Abdominal mass, RLQ (right lower quadrant)   Hydronephrosis of right kidney   Inguinal adenopathy   Acute hypoxemic respiratory failure-multifactorial; stable -Secondary to likely PE as well as some volume overload -There is also diffuse adenopathy with findings of lymphoma  Abdominal mass with suspected lymphoma -Status post excisional biopsy this a.m. -Plans to follow-up pathology with oncology outpatient  Pulmonary hypertension -Related to pulmonary embolism likely  Bilateral lower extremity DVTs -Continue on heparin drip at 1600 with high suspicion for PE given presenting symptoms -Will require lifelong anticoagulation and will transition to [facility] on discharge  AKI-likely obstructive status post bilateral JJ stent placement -Baseline 0.6 seen on 08/2018 -Right-sided hydronephrosis noted as well as fullness of left renal collecting system -We will continue to follow a.m. labs and maintain Foley catheter for now -Appreciate urology follow-up in a.m.  Hypertensive urgency-resolved -Borderline elevation still noted -Continue amlodipine and metoprolol -Hydralazine IV prn   DVT prophylaxis:Heparin infusion  to be resumed at 1600 Code Status:Full Family Communication:Wife at bedside Disposition Plan:Continue to follow a.m. labs and resume heparin drip.  Plan for discharge in the next 1 to 2 days if stable.   Consultants:  Pulmonology  Spoke with Dr. Delton Coombes on phone  General surgery  Urology  Procedures:  Excisional biopsy inguinal lymph node on right side 10/21  Bilateral JJ stent  placement 10/21  Antimicrobials:  Anti-infectives (From admission, onward)   Start     Dose/Rate Route Frequency Ordered Stop   04/21/19 0600  ceFAZolin (ANCEF) IVPB 2g/100 mL premix     2 g 200 mL/hr over 30 Minutes Intravenous On call to O.R. 04/20/19 1524 04/21/19 0747       Subjective: Patient seen and evaluated today after his procedures and is complaining of abdominal pain and fullness.  He is wanting to have his Foley catheter removed due to the discomfort.  Objective: Vitals:   04/21/19 0930 04/21/19 0945 04/21/19 1008 04/21/19 1131  BP: (!) 187/82 (!) 182/78 (!) 191/118 (!) 156/62  Pulse: 95 87 90 88  Resp: (!) 21 15 19    Temp:      TempSrc:      SpO2: 95% 97% 93%   Weight:      Height:        Intake/Output Summary (Last 24 hours) at 04/21/2019 1304 Last data filed at 04/21/2019 0911 Gross per 24 hour  Intake 1670.04 ml  Output 105 ml  Net 1565.04 ml   Filed Weights   04/17/19 0346 04/17/19 0804  Weight: 95.3 kg 98 kg    Examination:  General exam: Appears calm and comfortable  Respiratory system: Clear to auscultation. Respiratory effort normal.  Currently on nasal cannula oxygen. Cardiovascular system: S1 & S2 heard, RRR. No JVD, murmurs, rubs, gallops or clicks. No pedal edema. Gastrointestinal system: Abdomen is nondistended, soft and nontender. No organomegaly or masses felt. Normal bowel sounds heard. Central nervous system: Alert and oriented. No focal neurological deficits. Extremities: Symmetric 5 x 5 power. Skin: No rashes, lesions or ulcers Psychiatry: Judgement and insight appear normal. Mood & affect appropriate.  Foley with mild hematuria noted    Data Reviewed: I have personally reviewed following labs and imaging studies  CBC: Recent Labs  Lab 04/17/19 0349 04/18/19 0834 04/19/19 0555 04/20/19 0550 04/21/19 0552  WBC 10.5 8.0 7.4 6.5 7.5  NEUTROABS 7.7  --   --   --   --   HGB 14.3 12.2* 11.8* 11.2* 13.1  HCT 42.0 35.9*  35.3* 33.7* 40.9  MCV 92.5 93.0 94.1 94.9 96.7  PLT 134* 123* 134* 147* 275   Basic Metabolic Panel: Recent Labs  Lab 04/17/19 0349 04/18/19 0834 04/20/19 0550 04/21/19 0552  NA 136 140 143 143  K 3.6 4.0 4.0 3.9  CL 106 108 108 105  CO2 19* 23 26 28   GLUCOSE 117* 108* 96 97  BUN 32* 36* 31* 30*  CREATININE 2.19* 1.98* 1.96* 1.80*  CALCIUM 9.9 8.9 8.7* 9.7   GFR: Estimated Creatinine Clearance: 45.4 mL/min (A) (by C-G formula based on SCr of 1.8 mg/dL (H)). Liver Function Tests: Recent Labs  Lab 04/17/19 0349 04/18/19 0834  AST 18 14*  ALT 14 13  ALKPHOS 89 67  BILITOT 0.4 0.6  PROT 7.0 5.8*  ALBUMIN 3.7 3.1*   No results for input(s): LIPASE, AMYLASE in the last 168 hours. No results for input(s): AMMONIA in the last 168 hours. Coagulation Profile: Recent Labs  Lab 04/20/19 0827  INR 1.1   Cardiac Enzymes: No results for input(s): CKTOTAL, CKMB, CKMBINDEX, TROPONINI in the last 168 hours. BNP (last 3 results) No results for input(s): PROBNP in the last 8760 hours. HbA1C: No results for input(s): HGBA1C in the last 72 hours. CBG: No results for input(s): GLUCAP in the last 168 hours. Lipid Profile: No results for input(s): CHOL, HDL, LDLCALC, TRIG, CHOLHDL,  LDLDIRECT in the last 72 hours. Thyroid Function Tests: No results for input(s): TSH, T4TOTAL, FREET4, T3FREE, THYROIDAB in the last 72 hours. Anemia Panel: No results for input(s): VITAMINB12, FOLATE, FERRITIN, TIBC, IRON, RETICCTPCT in the last 72 hours. Sepsis Labs: Recent Labs  Lab 04/17/19 0349 04/17/19 0420 04/17/19 0544  PROCALCITON <0.10  --   --   LATICACIDVEN  --  1.7 1.5    Recent Results (from the past 240 hour(s))  SARS Coronavirus 2 by RT PCR (hospital order, performed in [facility] hospital lab) Nasopharyngeal Nasopharyngeal Swab     Status: None   Collection Time: 04/17/19  3:49 AM   Specimen: Nasopharyngeal Swab  Result Value Ref Range Status   SARS Coronavirus 2 NEGATIVE  NEGATIVE Final    Comment: (NOTE) If result is NEGATIVE SARS-CoV-2 target nucleic acids are NOT DETECTED. The SARS-CoV-2 RNA is generally detectable in upper and lower  respiratory specimens during the acute phase of infection. The lowest  concentration of SARS-CoV-2 viral copies this assay can detect is 250  copies / mL. A negative result does not preclude SARS-CoV-2 infection  and should not be used as the sole basis for treatment or other  patient management decisions.  A negative result may occur with  improper specimen collection / handling, submission of specimen other  than nasopharyngeal swab, presence of viral mutation(s) within the  areas targeted by this assay, and inadequate number of viral copies  (<250 copies / mL). A negative result must be combined with clinical  observations, patient history, and epidemiological information. If result is POSITIVE SARS-CoV-2 target nucleic acids are DETECTED. The SARS-CoV-2 RNA is generally detectable in upper and lower  respiratory specimens dur ing the acute phase of infection.  Positive  results are indicative of active infection with SARS-CoV-2.  Clinical  correlation with patient history and other diagnostic information is  necessary to determine patient infection status.  Positive results do  not rule out bacterial infection or co-infection with other viruses. If result is PRESUMPTIVE POSTIVE SARS-CoV-2 nucleic acids MAY BE PRESENT.   A presumptive positive result was obtained on the submitted specimen  and confirmed on repeat testing.  While 2019 novel coronavirus  (SARS-CoV-2) nucleic acids may be present in the submitted sample  additional confirmatory testing may be necessary for epidemiological  and / or clinical management purposes  to differentiate between  SARS-CoV-2 and other Sarbecovirus currently known to infect humans.  If clinically indicated additional testing with an alternate test  methodology [phone]) is  advised. The SARS-CoV-2 RNA is generally  detectable in upper and lower respiratory sp ecimens during the acute  phase of infection. The expected result is Negative. Fact Sheet for Patients:  StrictlyIdeas.no Fact Sheet for Healthcare Providers: BankingDealers.co.za This test is not yet approved or cleared by the [place] FDA and has been authorized for detection and/or diagnosis of SARS-CoV-2 by FDA under an Emergency Use Authorization (EUA).  This EUA will remain in effect (meaning this test can be used) for the duration of the COVID-19 declaration under Section 564(b)(1) of the Act, 21 U.S.C. section 360bbb-3(b)(1), unless the authorization is terminated or revoked sooner. Performed at [facility], [address] [place], [place] [address]   Blood Culture (routine x 2)     Status: None (Preliminary result)   Collection Time: 04/17/19  4:20 AM   Specimen: Right Antecubital; Blood  Result Value Ref Range Status   Specimen Description RIGHT ANTECUBITAL  Final   Special Requests  Final    BOTTLES DRAWN AEROBIC AND ANAEROBIC Blood Culture adequate volume   Culture   Final    NO GROWTH 4 DAYS Performed at [facility], [address] [place], [place] [address]    Report Status PENDING  Incomplete  Blood Culture (routine x 2)     Status: None (Preliminary result)   Collection Time: 04/17/19  4:30 AM   Specimen: Left Antecubital; Blood  Result Value Ref Range Status   Specimen Description LEFT ANTECUBITAL  Final   Special Requests   Final    BOTTLES DRAWN AEROBIC AND ANAEROBIC Blood Culture adequate volume   Culture   Final    NO GROWTH 4 DAYS Performed at [facility], [address], [place], [place] [address]    Report Status PENDING  Incomplete  MRSA PCR Screening     Status: None   Collection Time: 04/20/19  3:23 PM   Specimen: Nasal Mucosa; Nasopharyngeal  Result Value Ref Range Status   MRSA by PCR NEGATIVE NEGATIVE  Final    Comment:        The GeneXpert MRSA Assay (FDA approved for NASAL specimens only), is one component of a comprehensive MRSA colonization surveillance program. It is not intended to diagnose MRSA infection nor to guide or monitor treatment for MRSA infections. Performed at [facility], [address], [place], [place] [address]          Radiology Studies: Dg C-arm 1-60 Min-no Report  Result Date: 04/21/2019 Fluoroscopy was utilized by the requesting physician.  No radiographic interpretation.        Scheduled Meds: . amLODipine  10 mg Oral Daily  . mouth rinse  15 mL Mouth Rinse BID  . metoprolol tartrate  25 mg Oral BID  . oxybutynin  5 mg Oral Once  . sodium chloride flush  3 mL Intravenous Q12H   Continuous Infusions: . sodium chloride    . heparin       LOS: 4 days    Time spent: 30 minutes    Wray Goehring Darleen Crocker, DO [facility] Pager [phone]  If 7PM-7AM, please contact night-coverage [email or web address] Password [email or web address] 04/21/2019, 1:04 PM

## 2019-04-21 NOTE — Anesthesia Postprocedure Evaluation (Signed)
Anesthesia Post Note  Patient: TIMM BONENBERGER  Procedure(s) Performed: INGUINAL LYMPH NODE BIOPSY (Right ) CYSTOSCOPY WITH RETROGRADE PYELOGRAM/URETERAL STENT PLACEMENT (Bilateral )  Patient location during evaluation: PACU Anesthesia Type: General Level of consciousness: awake and alert and oriented Pain management: pain level controlled Vital Signs Assessment: post-procedure vital signs reviewed and stable Respiratory status: spontaneous breathing Cardiovascular status: blood pressure returned to baseline and stable Postop Assessment: no apparent nausea or vomiting Anesthetic complications: no     Last Vitals:  Vitals:   04/21/19 0930 04/21/19 0945  BP: (!) 187/82 (!) 182/78  Pulse: 95 87  Resp: (!) 21 15  Temp:    SpO2: 95% 97%    Last Pain:  Vitals:   04/21/19 0945  TempSrc:   PainSc: 0-No pain                 Reo Portela

## 2019-04-21 NOTE — Progress Notes (Signed)
Pt arrived back on the floor from surgery. BP elevated, which it has been all morning. PRN Hydralazine given, will recheck BP accordingly. Pt has no complaints besides feeling like he is going to "bust" from the pressure in his abdomen from the catheter. RN explained that was normal but he is still insisting on it coming out. Dr. Constance Haw made aware and Dr. Manuella Ghazi to be made aware. Will continue to monitor.

## 2019-04-21 NOTE — Interval H&P Note (Signed)
History and Physical Interval Note:  04/21/2019 7:21 AM  Kevin [name]  has presented today for surgery, with the diagnosis of adenopathy; bilateral hydronephrosis .  The various methods of treatment have been discussed with the patient and family. After consideration of risks, benefits and other options for treatment, the patient has consented to  Procedure(s): INGUINAL LYMPH NODE BIOPSY (Right) CYSTOSCOPY WITH RETROGRADE PYELOGRAM/URETERAL STENT PLACEMENT (Bilateral) as a surgical intervention.  The patient's history has been reviewed, patient examined, no change in status, stable for surgery.  I have reviewed the patient's chart and labs.  Questions were answered to the patient's satisfaction.    Right inguinal node biopsy, heparin held at [facility]

## 2019-04-21 NOTE — Consult Note (Signed)
[facility] Consultation Oncology  Name: Kevin [name]      MRN: [id number]    Location: A319/A319-01  Date: 04/21/2019 Time:5:50 PM   REFERRING PHYSICIAN: Dr. Manuella [name]  REASON FOR CONSULT: Diffuse lymphadenopathy and mesenteric mass   DIAGNOSIS: Highly likely lymphoma  HISTORY OF PRESENT ILLNESS: [name] is a 63 year old very pleasant white male who is seen in consultation today at the request of Dr. Manuella [name] for further work-up and management of lymphadenopathy and mesenteric mass.  He came into the hospital with shortness of breath and hypertensive urgency.  A CT of the chest done showed mediastinal and hilar and axillary adenopathy.  Bilateral lung infiltrates were seen.  A CT of the abdomen and pelvis showed mesenteric mass measuring up to 12 cm along with other lymphadenopathy.  He also had right hydronephrosis.  He was also found to have bilateral lower extremity DVT, right more than left.  VQ scan was low suspicion for pulmonary embolism.  However because of high clinical suspicion he was started on heparin drip.  He reports improvement in breathing.  He denies any fevers, night sweats or significant weight loss prior to presentation.  Denies any loss of appetite or easy satiety.  Denies any abdominal pains.  No headaches or vision changes reported.  No extremity weakness reported.  No tingling or numbness in extremities.  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  He was an ex-smoker, quit smoking 20 years ago.  He smoked 2 and half packs a day for 10 years.  Family history significant for younger brother with leukemia.  Another brother had colon or prostate cancer, patient not sure.  He lives at home with his wife and children.  PAST MEDICAL HISTORY:   Past Medical History:  Diagnosis Date  . Hypertension     ALLERGIES: No Known Allergies    MEDICATIONS: I have reviewed the patient's current medications.     PAST SURGICAL HISTORY History reviewed. No  pertinent surgical history.  FAMILY HISTORY: Family History  Problem Relation Age of Onset  . Leukemia Brother     SOCIAL HISTORY:  reports that he has never smoked. He has never used smokeless tobacco. He reports current alcohol use. He reports that he does not use drugs.  PERFORMANCE STATUS: The patient's performance status is 1 - Symptomatic but completely ambulatory  PHYSICAL EXAM: Most Recent Vital Signs: Blood pressure (!) 171/90, pulse 76, temperature 98 F (36.7 C), temperature source Oral, resp. rate 20, height 5\' 6"  (1.676 m), weight 216 lb 0.8 oz (98 kg), SpO2 97 %. BP (!) 171/90 (BP Location: Left Arm)   Pulse 76   Temp 98 F (36.7 C) (Oral)   Resp 20   Ht 5\' 6"  (1.676 m)   Wt 216 lb 0.8 oz (98 kg)   SpO2 97%   BMI 34.87 kg/m  General appearance: alert, cooperative and appears stated age Head: Normocephalic, without obvious abnormality, atraumatic Neck: no adenopathy, supple, symmetrical, trachea midline and thyroid not enlarged, symmetric, no tenderness/mass/nodules Lungs: clear to auscultation bilaterally Heart: regular rate and rhythm Abdomen: soft, non-tender; bowel sounds normal; no masses,  no organomegaly Extremities: extremities normal, atraumatic, no cyanosis or edema Skin: Skin color, texture, turgor normal. No rashes or lesions Lymph nodes: No cervical or axillary lymphadenopathy palpable.  Groin lymphadenopathy palpable.  Right groin lymph node excision site is healing well.  Neurologic: Grossly normal  LABORATORY DATA:  Results for orders placed or performed during the  hospital encounter of 04/17/19 (from the past 48 hour(s))  Heparin level (unfractionated)     Status: None   Collection Time: 04/20/19  5:50 AM  Result Value Ref Range   Heparin Unfractionated 0.42 0.30 - 0.70 IU/mL    Comment: (NOTE) If heparin results are below expected values, and patient dosage has  been confirmed, suggest follow up testing of antithrombin III  levels. Performed at [facility], [address], [place], [place] [address]   CBC     Status: Abnormal   Collection Time: 04/20/19  5:50 AM  Result Value Ref Range   WBC 6.5 4.0 - 10.5 K/uL   RBC 3.55 (L) 4.22 - 5.81 MIL/uL   Hemoglobin 11.2 (L) 13.0 - 17.0 g/dL   HCT 33.7 (L) 39.0 - 52.0 %   MCV 94.9 80.0 - 100.0 fL   MCH 31.5 26.0 - 34.0 pg   MCHC 33.2 30.0 - 36.0 g/dL   RDW 12.1 11.5 - 15.5 %   Platelets 147 (L) 150 - 400 K/uL   nRBC 0.0 0.0 - 0.2 %    Comment: Performed at [facility], [address], [place], [place] [address]  Basic metabolic panel     Status: Abnormal   Collection Time: 04/20/19  5:50 AM  Result Value Ref Range   Sodium 143 135 - 145 mmol/L   Potassium 4.0 3.5 - 5.1 mmol/L   Chloride 108 98 - 111 mmol/L   CO2 26 22 - 32 mmol/L   Glucose, Bld 96 70 - 99 mg/dL   BUN 31 (H) 8 - 23 mg/dL   Creatinine, Ser 1.96 (H) 0.61 - 1.24 mg/dL   Calcium 8.7 (L) 8.9 - 10.3 mg/dL   GFR calc non Af Amer 35 (L) >60 mL/min   GFR calc Af Amer 41 (L) >60 mL/min   Anion gap 9 5 - 15    Comment: Performed at [facility], [address], [place], [place] [address]  Protime-INR     Status: None   Collection Time: 04/20/19  8:27 AM  Result Value Ref Range   Prothrombin Time 14.0 11.4 - 15.2 seconds   INR 1.1 0.8 - 1.2    Comment: (NOTE) INR goal varies based on device and disease states. Performed at [facility], [address] [place], [place] [address]   MRSA PCR Screening     Status: None   Collection Time: 04/20/19  3:23 PM   Specimen: Nasal Mucosa; Nasopharyngeal  Result Value Ref Range   MRSA by PCR NEGATIVE NEGATIVE    Comment:        The GeneXpert MRSA Assay (FDA approved for NASAL specimens only), is one component of a comprehensive MRSA colonization surveillance program. It is not intended to diagnose MRSA infection nor to guide or monitor treatment for MRSA infections. Performed at [facility], [address] [place], [place] [address]   Heparin  level (unfractionated)     Status: Abnormal   Collection Time: 04/21/19  5:52 AM  Result Value Ref Range   Heparin Unfractionated <0.10 (L) 0.30 - 0.70 IU/mL    Comment: (NOTE) If heparin results are below expected values, and patient dosage has  been confirmed, suggest follow up testing of antithrombin III levels. Performed at [facility], [address], [place], [place] [address]   CBC     Status: None   Collection Time: 04/21/19  5:52 AM  Result Value Ref Range   WBC 7.5 4.0 - 10.5 K/uL  RBC 4.23 4.22 - 5.81 MIL/uL   Hemoglobin 13.1 13.0 - 17.0 g/dL   HCT 40.9 39.0 - 52.0 %   MCV 96.7 80.0 - 100.0 fL   MCH 31.0 26.0 - 34.0 pg   MCHC 32.0 30.0 - 36.0 g/dL   RDW 12.2 11.5 - 15.5 %   Platelets 211 150 - 400 K/uL   nRBC 0.0 0.0 - 0.2 %    Comment: Performed at [facility], [address], [place], [place] [address]  Basic metabolic panel     Status: Abnormal   Collection Time: 04/21/19  5:52 AM  Result Value Ref Range   Sodium 143 135 - 145 mmol/L   Potassium 3.9 3.5 - 5.1 mmol/L   Chloride 105 98 - 111 mmol/L   CO2 28 22 - 32 mmol/L   Glucose, Bld 97 70 - 99 mg/dL   BUN 30 (H) 8 - 23 mg/dL   Creatinine, Ser 1.80 (H) 0.61 - 1.24 mg/dL   Calcium 9.7 8.9 - 10.3 mg/dL   GFR calc non Af Amer 39 (L) >60 mL/min   GFR calc Af Amer 45 (L) >60 mL/min   Anion gap 10 5 - 15    Comment: Performed at [facility], [address] [place], [place] [address]      RADIOGRAPHY: Dg C-arm 1-60 Min-no Report  Result Date: 04/21/2019 Fluoroscopy was utilized by the requesting physician.  No radiographic interpretation.       PATHOLOGY: Pending biopsy on 04/21/2019.  ASSESSMENT and PLAN:  1.  Mesenteric mass and adenopathy: -CT of the chest on 04/18/2019 showed mediastinal and bilateral hilar adenopathy, left more than right.  Bilateral axillary adenopathy with some patchy bilateral airspace disease. -CT of the abdomen and pelvis without contrast on 04/28/2019 showed soft tissue mass  in the proximal mesentery measuring 12 cm.  Multifocal adenopathy throughout the abdomen and pelvis.  Enlarged inguinal lymph nodes right greater than left seen.  Right hydronephrosis and fullness of the left renal collecting system. -He did not have any fevers, night sweats or significant weight loss prior to presentation. -He had excisional biopsy of the lymph node today.  We are waiting for biopsy results. -LDH on presentation was minimally elevated at 202, normalized subsequently. -We will follow up on biopsy results and recommend treatment plan.  2.  Shortness of breath: -He presented to the hospital with shortness of breath of few days duration. -Because of his CKD, IV contrast could not be given.  He had a VQ scan which showed very low probability of pulmonary embolism. -Ultrasound on 04/18/2019 showed occlusive right lower extremity DVT extending from the right common femoral vein through the imaged tibial veins.  Positive DVT involving the left posterior tibial and peroneal veins. -Because of high index of suspicion for pulmonary embolism, he was started on heparin.  His breathing has improved.  3.  Acute kidney injury: -Baseline creatinine was 0.6 in February 2020. -CT scan showed right-sided hydronephrosis as well as fullness of the left renal collecting system. -He underwent cystoscopy and bilateral JJ stent placement on 04/21/2019.   All questions were answered. The patient knows to call the clinic with any problems, questions or concerns. We can certainly see the patient much sooner if necessary.    Derek Jack

## 2019-04-21 NOTE — Op Note (Signed)
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 Constance Haw, MD   Assistants: No qualified resident was available    Anesthesia: General endotracheal   Anesthesiologist: Lenice Llamas, MD    Specimens:  Right inguinal lymph node sent fresh   Estimated Blood Loss: Minimal   Blood Replacement: None    Complications: None   Wound Class: Clean    Operative Indications:  [name] is a 63 yo with a history of acute onset of SOB and findings of abdominal mass and adenopathy concerning for lymphoma. He also has bilateral DVT and has been on a heparin gtt. I have been asked to perform an excisional biopsy of the inguinal node for tissue diagnosis. We discussed the risk of bleeding, infection, inadequate tissue diagnosis, risk of lymph leak, and the patient has agreed to proceed.   Findings: Large 3cm+ right inguinal node   Procedure: The patient was taken to the operating room and general endotracheal anesthesia was induced. Intravenous antibiotics were administered per protocol.  Dr. Alyson Ingles performed bilateral ureteral stents (see his note). Following this, the patient was taken out of lithotomy and placed supine. His right groin was prepared and draped in the usual sterile fashion.   An incision was made superior to his right groin crease in the location of the enlarged node. This incision was carried down through the subcutaneous tissue with electrocautery. Using a combination of blunt and electrocautery dissection, the lymph node was excised from the superficial space.  Clips were applied to obvious small vessels and possible lymph channels in the area as dissection was performed. The specimen was sent fresh.   Final inspection revealed acceptable hemostasis. The deep space was closed with 3-0  Vicryl suture and the skin was closed with 4-0 Monocryl running suture and Dermabond. All counts were correct at the end of the case. The patient was awakened from anesthesia and extubated without complication.  The patient went to the PACU in stable condition.   Curlene Labrum, MD [facility] [address] [place], [place] [address] [phone] (office)

## 2019-04-21 NOTE — Interval H&P Note (Signed)
History and Physical Interval Note:  04/21/2019 7:38 AM  Kevin [name]  has presented today for surgery, with the diagnosis of adenopathy; bilateral hydronephrosis .  The various methods of treatment have been discussed with the patient and family. After consideration of risks, benefits and other options for treatment, the patient has consented to  Procedure(s): INGUINAL LYMPH NODE BIOPSY (Right) CYSTOSCOPY WITH RETROGRADE PYELOGRAM/URETERAL STENT PLACEMENT (Bilateral) as a surgical intervention.  The patient's history has been reviewed, patient examined, no change in status, stable for surgery.  I have reviewed the patient's chart and labs.  Questions were answered to the patient's satisfaction.     Nicolette Bang

## 2019-04-21 NOTE — Progress Notes (Signed)
[facility]  Inguinal lymph node biopsy complete. Dr. Alyson Ingles also able to place the stents without issues.   Can restart anticoagulation at 4pm. Diet ordered. Have attempted to call [facility] but do not have the correct number. RN is seeing if it is written in the room. No one answered in patient's room currently.    Curlene Labrum, MD [facility] [address] [place], [place] [address] [phone] (office)

## 2019-04-21 NOTE — Transfer of Care (Signed)
Immediate Anesthesia Transfer of Care Note  Patient: BURT PIATEK  Procedure(s) Performed: INGUINAL LYMPH NODE BIOPSY (Right ) CYSTOSCOPY WITH RETROGRADE PYELOGRAM/URETERAL STENT PLACEMENT (Bilateral )  Patient Location: PACU  Anesthesia Type:General  Level of Consciousness: awake, alert  and oriented  Airway & Oxygen Therapy: Patient Spontanous Breathing and Patient connected to nasal cannula oxygen  Post-op Assessment: Report given to RN, Post -op Vital signs reviewed and stable and Patient moving all extremities  Post vital signs: Reviewed and stable  Last Vitals:  Vitals Value Taken Time  BP 182/80 04/21/19 0918  Temp    Pulse 96 04/21/19 0921  Resp 13 04/21/19 0921  SpO2 87 % 04/21/19 0921  Vitals shown include unvalidated device data.  Last Pain:  Vitals:   04/21/19 0702  TempSrc: Oral  PainSc: 0-No pain      Patients Stated Pain Goal: 8 (88/82/80 0349)  Complications: No apparent anesthesia complications

## 2019-04-21 NOTE — Op Note (Signed)
Preoperative diagnosis: bilateral malignant obstruction  Postoperative diagnosis: Same  Procedure: 1 cystoscopy 2. bilateralretrograde pyelography 3.  Intraoperative fluoroscopy, under one hour, with interpretation 4.  bilateral 7 x 26 JJ stent placement  Attending: Rosie Fate  Anesthesia: General  Estimated blood loss: None  Drains: bilateral 7 x 26 JJ ureteral stent without tether 18 french foley  Specimens: none  Antibiotics: ancef  Findings: moderate bilateral hydronephrosis starting at pelvic brim. No masses/lesions in the bladder. Ureteral orifices in normal anatomic location.  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 After discussing treatment options, they decided proceed with bilateral stent placement.  Procedure her in detail: The patient was brought to the operating room and a brief timeout was done to ensure correct patient, correct procedure, correct site.  General anesthesia was administered patient was placed in dorsal lithotomy position.  Her genitalia was then prepped and draped in usual sterile fashion.  A rigid 3 French cystoscope was passed in the urethra and the bladder.  Bladder was inspected free masses or lesions.  the ureteral orifices were in the normal orthotopic locations. a 6 french ureteral catheter was then instilled into the left ureteral orifice.  a gentle retrograde was obtained and findings noted above. We then advanced a zipwire up to the renal pelvis. We then placed a 7 x 26 double-j ureteral stent over the zip wire. We then removed the wire and good coil was noted in the the renal pelvis under fluoroscopy and the bladder under direct vision.  We then turned out attention to the right side.  a gentle retrograde was obtained and findings noted above. We then advanced a zipwire up to the renal pelvis. we then placed a 7 x 26 double-j ureteral stent over the  original zip wire.  We then removed the wire and good coil was noted in the the renal pelvis under fluoroscopy and the bladder under direct vision. We then placed an 18 french foley catheter.  The bladder was then drained and this concluded the procedure which was well tolerated by patient.  Complications: None  Condition: Stable   Plan: Ureteral stents are to remain in place and repeat renal SU can be performed at 1 month to ensure patency. Continue to monitor creatinine

## 2019-04-21 NOTE — Discharge Instructions (Signed)
Discharge Instructions Groin Lymph Node Biopsy:  Diet/ Activity: Diet and activity as tolerated.  Shower per your regular routine daily.  Do not take hot showers as this can cause the glue to peel.  Do not pick or peel at the glue.  Do not submerge in bath until completely healed, likely 4 weeks+.  Monitor the site for swelling or bleeding.  This glue film will remain in place for 1-2 weeks and will start to peel off.  Do not place lotions or balms on your incision unless instructed to specifically by Dr. Constance Haw.    Open Lymph Node Biopsy, Care After This sheet gives you information about how to care for yourself after your procedure. Your health care provider may also give you more specific instructions. If you have problems or questions, contact your health care provider. What can I expect after the procedure? After the procedure, it is common to have:  Bruising.  Soreness.  Mild swelling. Follow these instructions at home: Medicines  Take over-the-counter and prescription medicines only as told by your health care provider.  If you were prescribed an antibiotic medicine, take it as told by your health care provider. Do not stop taking the antibiotic even if you start to feel better. Incision care   Follow instructions from your health care provider about how to take care of your incision. Make sure you: ? Wash your hands with soap and water before and after you change your bandage (dressing). If soap and water are not available, use hand sanitizer. ? Change your dressing as told by your health care provider. ? Leave stitches (sutures), skin glue, or adhesive strips in place. These skin closures may need to stay in place for 2 weeks or longer. If adhesive strip edges start to loosen and curl up, you may trim the loose edges. Do not remove adhesive strips completely unless your health care provider tells you to do that.  Check your incision area every day for signs of infection.  Check for: ? More redness, swelling, or pain. ? Fluid or blood. ? Warmth. ? Pus or a bad smell. Driving  Do not drive for 24 hours if you were given a sedative during your procedure.  Do not drive or use heavy machinery while taking prescription pain medicine. General instructions  Return to your normal activities as told by your health care provider. Ask your health care provider what activities are safe for you.  Do not take baths, swim, or use a hot tub until your health care provider approves. Ask your health care provider if you may take showers. You may only be allowed to take sponge baths.  Keep all follow-up visits as told by your health care provider. This is important. Contact a health care provider if:  You have more redness, swelling, or pain around your incision.  You have fluid or blood coming from your incision.  Your incision feels warm to the touch.  You have pus or a bad smell coming from your incision.  You have a fever.  You have pain or numbness that gets worse or lasts longer than a few days. Summary  After a lymph node biopsy, it is common to have bruising, soreness, and mild swelling.  Follow your health care provider's instructions about taking care of yourself at home. You will be told how to take medicines, take care of your incision, and check for infection.  Return to your normal activities as told by your health care provider. Ask  your health care provider what activities are safe for you.  Contact a health care provider if you have more redness, swelling, or pain around your incision, you have a fever, or you have worsening pain or numbness. This information is not intended to replace advice given to you by your health care provider. Make sure you discuss any questions you have with your health care provider. Document Released: 07/14/2015 Document Revised: 01/22/2018 Document Reviewed: 01/22/2018 Elsevier Patient Education  2020 [employer].

## 2019-04-21 NOTE — Anesthesia Preprocedure Evaluation (Signed)
Anesthesia Evaluation  Patient identified by MRN, date of birth, ID band Patient awake    Reviewed: Allergy & Precautions, NPO status , Patient's Chart, lab work & pertinent test results, reviewed documented beta blocker date and time   Airway Mallampati: II  TM Distance: >3 FB Neck ROM: Full    Dental no notable dental hx. (+) Teeth Intact   Pulmonary shortness of breath and with exertion,    Pulmonary exam normal breath sounds clear to auscultation       Cardiovascular Exercise Tolerance: Poor hypertension, Pt. on home beta blockers and Pt. on medications negative cardio ROS Normal cardiovascular examII Rhythm:Regular Rate:Normal  States had good ET prior to Admission for SOB Denies known cardiac issues  Pulm HTN PAPs ~70s   Neuro/Psych negative neurological ROS  negative psych ROS   GI/Hepatic negative GI ROS, Neg liver ROS,   Endo/Other  negative endocrine ROS  Renal/GU Renal InsufficiencyRenal disease  negative genitourinary   Musculoskeletal negative musculoskeletal ROS (+)   Abdominal   Peds negative pediatric ROS (+)  Hematology negative hematology ROS (+)   Anesthesia Other Findings Pt with large ? Tumor burden on CT Here for Node Bx for tissue Dx and Bilat stents for hydronephrosis  Reproductive/Obstetrics negative OB ROS                             Anesthesia Physical Anesthesia Plan  ASA: IV  Anesthesia Plan: General   Post-op Pain Management:    Induction: Intravenous  PONV Risk Score and Plan: 2 and Midazolam, Ondansetron and Treatment may vary due to age or medical condition  Airway Management Planned: Oral ETT  Additional Equipment:   Intra-op Plan:   Post-operative Plan: Extubation in OR  Informed Consent: I have reviewed the patients History and Physical, chart, labs and discussed the procedure including the risks, benefits and alternatives for the  proposed anesthesia with the patient or authorized representative who has indicated his/her understanding and acceptance.     Dental advisory given  Plan Discussed with: CRNA  Anesthesia Plan Comments: (Plan Full PPE use Plan GETA D/W PT -WTP with same after Q&A  D/w pt possibility of postop ventilation as needed -voiced understanding -WTP)        Anesthesia Quick Evaluation

## 2019-04-21 NOTE — Progress Notes (Signed)
ANTICOAGULATION CONSULT NOTE - Follow-Up Consult  Pharmacy Consult for Heparin Indication: VTE treatment  No Known Allergies  Patient Measurements: Height: 5\' 6"  (167.6 cm) Weight: 216 lb 0.8 oz (98 kg) IBW/kg (Calculated) : 63.8 HEPARIN DW (KG): 85.2  Vital Signs: Temp: 99 F (37.2 C) (10/21 0918) Temp Source: Oral (10/21 0702) BP: 191/118 (10/21 1008) Pulse Rate: 90 (10/21 1008)  Labs: Recent Labs    04/19/19 0555 04/19/19 1457 04/20/19 0550 04/20/19 0827 04/21/19 0552  HGB 11.8*  --  11.2*  --  13.1  HCT 35.3*  --  33.7*  --  40.9  PLT 134*  --  147*  --  211  LABPROT  --   --   --  14.0  --   INR  --   --   --  1.1  --   HEPARINUNFRC 0.28* 0.36 0.42  --  <0.10*  CREATININE  --   --  1.96*  --  1.80*    Estimated Creatinine Clearance: 45.4 mL/min (A) (by C-G formula based on SCr of 1.8 mg/dL (H)).    Assessment: Patient presented with SOB and significantly elevated D-dimer . VQ scan  is negative for PE. Bilateral LE DVTs and concern for PE.  Heparin infusion was held for inguinal lymph node biopsy today.  04-21-19 AM UPDATE:  Re-start heparin at 1600 today CBC WNL  Goal of Therapy:  Heparin level 0.3-0.7 units/ml Monitor platelets by anticoagulation protocol: Yes   Plan:  Re-start heparin infusion at 1750 units/hr at 1600 today Confirmatory heparin level at 0000 on 04-22-19 Check anti-Xa level   daily while on heparin Continue to monitor H&H and platelets  Despina Pole, Pharm. D. Clinical Pharmacist 04/21/2019 10:19 AM

## 2019-04-22 ENCOUNTER — Encounter (HOSPITAL_COMMUNITY): Payer: Self-pay | Admitting: General Surgery

## 2019-04-22 LAB — CBC
HCT: 36.8 % — ABNORMAL LOW (ref 39.0–52.0)
Hemoglobin: 11.6 g/dL — ABNORMAL LOW (ref 13.0–17.0)
MCH: 30.9 pg (ref 26.0–34.0)
MCHC: 31.5 g/dL (ref 30.0–36.0)
MCV: 98.1 fL (ref 80.0–100.0)
Platelets: 159 10*3/uL (ref 150–400)
RBC: 3.75 MIL/uL — ABNORMAL LOW (ref 4.22–5.81)
RDW: 12.2 % (ref 11.5–15.5)
WBC: 8.2 10*3/uL (ref 4.0–10.5)
nRBC: 0 % (ref 0.0–0.2)

## 2019-04-22 LAB — BASIC METABOLIC PANEL
Anion gap: 10 (ref 5–15)
BUN: 19 mg/dL (ref 8–23)
CO2: 27 mmol/L (ref 22–32)
Calcium: 8.8 mg/dL — ABNORMAL LOW (ref 8.9–10.3)
Chloride: 107 mmol/L (ref 98–111)
Creatinine, Ser: 0.89 mg/dL (ref 0.61–1.24)
GFR calc Af Amer: >60 mL/min (ref >60)
GFR calc non Af Amer: >60 mL/min (ref >60)
Glucose, Bld: 93 mg/dL (ref 70–99)
Potassium: 4.1 mmol/L (ref 3.5–5.1)
Sodium: 144 mmol/L (ref 135–145)

## 2019-04-22 LAB — CULTURE, BLOOD (ROUTINE X 2)
Culture: NO GROWTH
Culture: NO GROWTH
Special Requests: ADEQUATE
Special Requests: ADEQUATE

## 2019-04-22 LAB — HEPARIN LEVEL (UNFRACTIONATED)
Heparin Unfractionated: 0.39 IU/mL (ref 0.30–0.70)
Heparin Unfractionated: 0.39 IU/mL (ref 0.30–0.70)

## 2019-04-22 MED ORDER — TRAMADOL HCL 50 MG PO TABS: 50.0000 mg | ORAL_TABLET | Freq: Four times a day (QID) | ORAL | 0 refills | Status: DC | PRN

## 2019-04-22 MED ORDER — APIXABAN 5 MG PO TABS: 10.0000 mg | ORAL_TABLET | Freq: Two times a day (BID) | ORAL | 0 refills | Status: DC

## 2019-04-22 MED ORDER — APIXABAN 5 MG PO TABS: 5.0000 mg | ORAL_TABLET | Freq: Two times a day (BID) | ORAL | Status: DC

## 2019-04-22 MED ORDER — APIXABAN 5 MG PO TABS
10.0000 mg | ORAL_TABLET | Freq: Two times a day (BID) | ORAL | Status: DC
  Administered 2019-04-22: 10 mg via ORAL
  Filled 2019-04-22: qty 2

## 2019-04-22 MED ORDER — APIXABAN 5 MG PO TABS: 5.0000 mg | ORAL_TABLET | Freq: Two times a day (BID) | ORAL | 3 refills | Status: DC

## 2019-04-22 NOTE — Progress Notes (Signed)
ANTICOAGULATION CONSULT NOTE - Follow-Up Consult  Pharmacy Consult for Heparin Indication: VTE treatment  No Known Allergies  Patient Measurements: Height: 5\' 6"  (167.6 cm) Weight: 216 lb 0.8 oz (98 kg) IBW/kg (Calculated) : 63.8 HEPARIN DW (KG): 85.2  Vital Signs: Temp: 98.9 F (37.2 C) (10/22 0519) Temp Source: Oral (10/22 0519) BP: 159/65 (10/22 0519) Pulse Rate: 65 (10/22 0519)  Labs: Recent Labs    04/20/19 0550 04/20/19 0827 04/21/19 0552 04/21/19 2353 04/22/19 0600  HGB 11.2*  --  13.1  --  11.6*  HCT 33.7*  --  40.9  --  36.8*  PLT 147*  --  211  --  159  LABPROT  --  14.0  --   --   --   INR  --  1.1  --   --   --   HEPARINUNFRC 0.42  --  <0.10* 0.39 0.39  CREATININE 1.96*  --  1.80*  --  0.89    Estimated Creatinine Clearance: 91.9 mL/min (by C-G formula based on SCr of 0.89 mg/dL).    Assessment: Patient presented with SOB and significantly elevated D-dimer . VQ scan  is negative for PE. Bilateral LE DVTs and concern for PE.  Heparin infusion was held for inguinal lymph node biopsy yesterday and restarted post-biopsy.  Heparin level therapeutic (0.39) on gtt at 1750 units/hr.  Goal of Therapy:  Heparin level 0.3-0.7 units/ml Monitor platelets by anticoagulation protocol: Yes   Plan:  Continue heparin infusion at 1750 units/hr  Check anti-Xa level   daily while on heparin Continue to monitor H&H and platelets  Isac Sarna, BS Vena [place], BCPS Clinical Pharmacist Pager [phone] 04/22/2019 9:20 AM

## 2019-04-22 NOTE — Progress Notes (Signed)
Nsg Discharge Note  Admit Date:  04/17/2019 Discharge date: 04/22/2019   Marguarite Arbour to be D/C'd  Home per MD order.  AVS completed.  Copy for chart, and copy for patient signed, and dated. Patient/caregiver able to verbalize understanding.  Discharge Medication: Allergies as of 04/22/2019   No Known Allergies     Medication List    TAKE these medications   amLODipine 10 MG tablet Commonly known as: NORVASC Take 10 mg by mouth daily.   apixaban 5 MG Tabs tablet Commonly known as: ELIQUIS Take 2 tablets (10 mg total) by mouth 2 (two) times daily for 7 days.   apixaban 5 MG Tabs tablet Commonly known as: ELIQUIS Take 1 tablet (5 mg total) by mouth 2 (two) times daily. Start taking on: April 29, 2019   metoprolol succinate 50 MG 24 hr tablet Commonly known as: TOPROL-XL Take 50 mg by mouth daily.   traMADol 50 MG tablet Commonly known as: Ultram Take 1 tablet (50 mg total) by mouth every 6 (six) hours as needed for moderate pain or severe pain.            Durable Medical Equipment  (From admission, onward)         Start     Ordered   04/22/19 1048  DME Oxygen  Once    Question Answer Comment  Length of Need 12 Months   Mode or (Route) Nasal cannula   Liters per Minute 2   Frequency Continuous (stationary and portable oxygen unit needed)   Oxygen conserving device Yes   Oxygen delivery system Gas      04/22/19 1048          Discharge Assessment: Vitals:   04/22/19 0939 04/22/19 1303  BP:  (!) 145/84  Pulse:  77  Resp:  20  Temp:  98.2 F (36.8 C)  SpO2: 90% 93%   Skin clean, dry and intact without evidence of skin break down, no evidence of skin tears noted. IV catheter discontinued intact. Site without signs and symptoms of complications - no redness or edema noted at insertion site, patient denies c/o pain - only slight tenderness at site.  Dressing with slight pressure applied.  D/c Instructions-Education: Discharge instructions given  to patient/family with verbalized understanding. D/c education completed with patient/family including follow up instructions, medication list, d/c activities limitations if indicated, with other d/c instructions as indicated by MD - patient able to verbalize understanding, all questions fully answered. Patient instructed to return to ED, call 911, or call MD for any changes in condition.  Patient escorted via [place], and D/C home via private auto.  Carney Corners, RN 04/22/2019 1:49 PM

## 2019-04-22 NOTE — Progress Notes (Addendum)
SATURATION QUALIFICATIONS: (This note is used to comply with regulatory documentation for home oxygen)  Patient Saturations on Room Air at Rest = 84%  Patient Saturations on Room Air while Ambulating = 83%%  Patient Saturations on 2 Liters of oxygen while Ambulating = 89-92%%  Please briefly explain why patient needs home oxygen: Pt desat's severely when not on any oxygen.

## 2019-04-22 NOTE — Progress Notes (Signed)
Awaiting oxygen to be delivered before patient can be discharged.

## 2019-04-22 NOTE — Progress Notes (Signed)
Pt Kevin [name] catheter removed, WNL. Will continue to monitor for patient to urinate on his own.

## 2019-04-22 NOTE — Progress Notes (Signed)
ANTICOAGULATION CONSULT NOTE - Follow-Up Consult  Pharmacy Consult for Heparin Indication: VTE treatment  No Known Allergies  Patient Measurements: Height: 5\' 6"  (167.6 cm) Weight: 216 lb 0.8 oz (98 kg) IBW/kg (Calculated) : 63.8 HEPARIN DW (KG): 85.2  Vital Signs: Temp: 98.4 F (36.9 C) (10/21 2340) Temp Source: Oral (10/21 2340) BP: 140/70 (10/21 2340) Pulse Rate: 57 (10/21 2340)  Labs: Recent Labs    04/19/19 0555  04/20/19 0550 04/20/19 0827 04/21/19 0552 04/21/19 2353  HGB 11.8*  --  11.2*  --  13.1  --   HCT 35.3*  --  33.7*  --  40.9  --   PLT 134*  --  147*  --  211  --   LABPROT  --   --   --  14.0  --   --   INR  --   --   --  1.1  --   --   HEPARINUNFRC 0.28*   < > 0.42  --  <0.10* 0.39  CREATININE  --   --  1.96*  --  1.80*  --    < > = values in this interval not displayed.    Estimated Creatinine Clearance: 45.4 mL/min (A) (by C-G formula based on SCr of 1.8 mg/dL (H)).    Assessment: Patient presented with SOB and significantly elevated D-dimer . VQ scan  is negative for PE. Bilateral LE DVTs and concern for PE.  Heparin infusion was held for inguinal lymph node biopsy yesterday and restarted post-biopsy.  Heparin level therapeutic (0.39) on gtt at 1750 units/hr.  Goal of Therapy:  Heparin level 0.3-0.7 units/ml Monitor platelets by anticoagulation protocol: Yes   Plan:  Continue heparin infusion at 1750 units/hr  Check anti-Xa level   daily while on heparin Continue to monitor H&H and platelets  Sherlon Handing, PharmD, BCPS 04/22/2019 1:20 AM

## 2019-04-22 NOTE — Evaluation (Signed)
Physical Therapy Evaluation Patient Details Name: Kevin [name] MRN: [id number] DOB: [birth year] Today's Date: 04/22/2019   History of Present Illness  Kevin [name]  is a 63 y.o. male, s/p bilateral Ureteral stents and  Excisional biopsy inguinal lymph node on 04/21/19, with history of hypertension, hypercholesterolemia who came to hospital with worsening shortness of breath of 1 day duration.  Patient says that this morning he had some shortness of breath which got better and at 2 AM it became worse.  Patient says that he has hypertension and has been prescribed 3 medications, of which he is has been only taking 2 medications as he ran out of third 1 2 months ago.  He denies history of CHF, CAD.  No history of stroke or seizures.He denies fever or chills.    Clinical Impression  Patient functioning at baseline for functional mobility and gait.  Plan:  Patient discharged from physical therapy to care of nursing for ambulation daily as tolerated for length of stay.     Follow Up Recommendations No PT follow up;Supervision - Intermittent    Equipment Recommendations  None recommended by PT    Recommendations for Other Services       Precautions / Restrictions Precautions Precautions: None Restrictions Weight Bearing Restrictions: No      Mobility  Bed Mobility Overal bed mobility: Modified Independent             General bed mobility comments: increased time  Transfers Overall transfer level: Modified independent               General transfer comment: increased time  Ambulation/Gait Ambulation/Gait assistance: Modified independent (Device/Increase time) Gait Distance (Feet): 150 Feet Assistive device: None Gait Pattern/deviations: WFL(Within Functional Limits) Gait velocity: slightly decreased   General Gait Details: demonstrates good return for ambulation in room/hallways without loss of balance, on room air with SpO2 dropping from 91% to 78%  Stairs            Wheelchair Mobility    Modified Rankin (Stroke Patients Only)       Balance Overall balance assessment: No apparent balance deficits (not formally assessed)                                           Pertinent Vitals/Pain Pain Assessment: Faces Faces Pain Scale: Hurts little more Pain Location: groin area at stent placements Pain Descriptors / Indicators: Sore Pain Intervention(s): Limited activity within patient's tolerance;Monitored during session    Home Living Family/patient expects to be discharged to:: Private residence Living Arrangements: Spouse/significant other;Children Available Help at Discharge: Family;Available 24 hours/day Type of Home: House Home Access: Ramped entrance     Home Layout: One level Home Equipment: Clinical cytogeneticist - 2 wheels      Prior Function Level of Independence: Independent         Comments: community ambulator, drives     Journalist, newspaper        Extremity/Trunk Assessment   Upper Extremity Assessment Upper Extremity Assessment: Overall WFL for tasks assessed    Lower Extremity Assessment Lower Extremity Assessment: Overall WFL for tasks assessed    Cervical / Trunk Assessment Cervical / Trunk Assessment: Normal  Communication   Communication: No difficulties  Cognition Arousal/Alertness: Awake/alert Behavior During Therapy: WFL for tasks assessed/performed Overall Cognitive Status: Within Functional Limits for tasks assessed  General Comments      Exercises     Assessment/Plan    PT Assessment Patent does not need any further PT services  PT Problem List         PT Treatment Interventions      PT Goals (Current goals can be found in the Care Plan section)  Acute Rehab PT Goals Patient Stated Goal: return home with family to assist PT Goal Formulation: With patient Time For Goal Achievement: 04/22/19 Potential to  Achieve Goals: Good    Frequency     Barriers to discharge        Co-evaluation               AM-PAC PT "6 Clicks" Mobility  Outcome Measure Help needed turning from your back to your side while in a flat bed without using bedrails?: None Help needed moving from lying on your back to sitting on the side of a flat bed without using bedrails?: None Help needed moving to and from a bed to a chair (including a wheelchair)?: None Help needed standing up from a chair using your arms (e.g., wheelchair or bedside chair)?: None Help needed to walk in hospital room?: None Help needed climbing 3-5 steps with a railing? : None 6 Click Score: 24    End of Session   Activity Tolerance: Patient tolerated treatment well Patient left: in chair;with call bell/phone within reach Nurse Communication: Mobility status PT Visit Diagnosis: Unsteadiness on feet (R26.81);Other abnormalities of gait and mobility (R26.89);Muscle weakness (generalized) (M62.81)    Time: 7564-3329 PT Time Calculation (min) (ACUTE ONLY): 23 min   Charges:   PT Evaluation $PT Eval Moderate Complexity: 1 Mod PT Treatments $Therapeutic Activity: 23-37 mins        12:09 PM, 04/22/19 Lonell Grandchild, MPT Physical Therapist with [facility] [phone] office [phone] mobile phone

## 2019-04-22 NOTE — Progress Notes (Signed)
ANTICOAGULATION CONSULT NOTE - Follow-Up Consult  Pharmacy Consult for Heparin--> eliquis Indication: VTE treatment  No Known Allergies  Patient Measurements: Height: 5\' 6"  (167.6 cm) Weight: 216 lb 0.8 oz (98 kg) IBW/kg (Calculated) : 63.8 HEPARIN DW (KG): 85.2  Vital Signs: Temp: 98.9 F (37.2 C) (10/22 0519) Temp Source: Oral (10/22 0519) BP: 159/65 (10/22 0519) Pulse Rate: 65 (10/22 0519)  Labs: Recent Labs    04/20/19 0550 04/20/19 0827 04/21/19 0552 04/21/19 2353 04/22/19 0600  HGB 11.2*  --  13.1  --  11.6*  HCT 33.7*  --  40.9  --  36.8*  PLT 147*  --  211  --  159  LABPROT  --  14.0  --   --   --   INR  --  1.1  --   --   --   HEPARINUNFRC 0.42  --  <0.10* 0.39 0.39  CREATININE 1.96*  --  1.80*  --  0.89    Estimated Creatinine Clearance: 91.9 mL/min (by C-G formula based on SCr of 0.89 mg/dL).    Assessment: Patient presented with SOB and significantly elevated D-dimer . VQ scan  is negative for PE. Bilateral LE DVTs and concern for PE.  Heparin infusion was held for inguinal lymph node biopsy yesterday and restarted post-biopsy. Plan to transition to PO eliquis now.  Goal of Therapy:  Heparin level 0.3-0.7 units/ml Monitor platelets by anticoagulation protocol: Yes   Plan:  D/C heparin Eliquis 10mg  po bid x 7 days, then 5mg  po bid Continue to monitor H&H and platelets  Isac Sarna, BS [place]ena [place], BCPS Clinical Pharmacist Pager [phone] 04/22/2019 10:39 AM

## 2019-04-22 NOTE — TOC Initial Note (Signed)
Transition of Care [facility]) - Initial/Assessment Note    Patient Details  Name: Kevin [name] MRN: [id number] Date of Birth: [birth year]  [facility]) CM/SW Contact:    Ihor Gully, LCSW Phone Number: 04/22/2019, 1:43 PM  Clinical Narrative:                 Patient is need of oxygen. DME choices provided from [facility] website and oxygen ordered through [facility] [place].  Patient's PCP office closed. Provider choices given and patient scheduled with Hendricks Limes, NP, at [facility] for 05/07/2019 @ 8:30. Appointment information provided to patient and placed on AVS. Patient advised to go to the office prior to appointment to completed new patient agreement. Patient agreeable. RN and patient aware that oxygen will be delivered to room and that patient needs to remain here until it is delivered. TOC signing off.   Expected Discharge Plan: Home/Self Care Barriers to Discharge: No Barriers Identified   Patient Goals and CMS Choice Patient states their goals for this hospitalization and ongoing recovery are:: Return to Baseline      Expected Discharge Plan and Services Expected Discharge Plan: Home/Self Care       Living arrangements for the past 2 months: Single Family Home Expected Discharge Date: 04/22/19               DME Arranged: Oxygen DME Agency: [employer] Date DME Agency Contacted: 04/22/19 Time DME Agency Contacted: 1010 Representative spoke with at DME Agency: Blake Divine            Prior Living Arrangements/Services Living arrangements for the past 2 months: [place] with:: Spouse Patient language and need for interpreter reviewed:: Yes Do you feel safe going back to the place where you live?: Yes      Need for Family Participation in Patient Care: Yes (Comment) Care giver support system in place?: Yes (comment)   Criminal Activity/Legal Involvement Pertinent to Current Situation/Hospitalization: No -  Comment as needed  Activities of Daily Living Home Assistive Devices/Equipment: None ADL Screening (condition at time of admission) Patient's cognitive ability adequate to safely complete daily activities?: Yes Is the patient deaf or have difficulty hearing?: No Does the patient have difficulty seeing, even when wearing glasses/contacts?: No Does the patient have difficulty concentrating, remembering, or making decisions?: No Patient able to express need for assistance with ADLs?: Yes Does the patient have difficulty dressing or bathing?: No Independently performs ADLs?: Yes (appropriate for developmental age) Does the patient have difficulty walking or climbing stairs?: No Weakness of Legs: None Weakness of Arms/Hands: None  Permission Sought/Granted Permission sought to share information with : PCP       Permission granted to share info w AGENCY: [facility]        Emotional Assessment Appearance:: Appears stated age   Affect (typically observed): Appropriate Orientation: : Oriented to Self, Oriented to Place, Oriented to  Time, Oriented to Situation Alcohol / Substance Use: Not Applicable Psych Involvement: No (comment)  Admission diagnosis:  Hypoxia [R09.02] Hypertensive urgency [I16.0] Acute congestive heart failure, unspecified heart failure type [facility]) [I50.9] Patient Active Problem List   Diagnosis Date Noted  . Inguinal adenopathy   . Leg DVT (deep venous thromboembolism), acute, bilateral ([place]) 04/18/2019  . Abdominal mass, RLQ (right lower quadrant) 04/18/2019  . Hydronephrosis of right kidney 04/18/2019  . Hypertensive urgency 04/17/2019  . Acute respiratory failure with hypoxia ([employer]) 04/17/2019  . HTN (hypertension) 04/17/2019  .  Pulmonary hypertension ([place]) 04/17/2019  . AKI (acute kidney injury) ([place]) 04/17/2019   PCP:  Loman Brooklyn, FNP Pharmacy:   [id number] - [place], [place] - [address] [place] [address] Phone: [phone] Fax: [phone]     Social Determinants of Health (SDOH) Interventions    Readmission Risk Interventions Readmission Risk Prevention Plan 04/22/2019  Post Dischage Appt Complete  Medication Screening Complete  Transportation Screening Complete  Some recent data might be hidden

## 2019-04-22 NOTE — Discharge Summary (Signed)
Physician Discharge Summary  Kevin [name] KCL:[id number] DOB: 22-Dec-1955 DOA: 04/17/2019  PCP: Dione Housekeeper, MD  Admit date: 04/17/2019  Discharge date: 04/22/2019  Admitted From:Home  Disposition:  Home  Recommendations for Outpatient Follow-up:  1. Follow up with PCP in 1-2 weeks 2. Follow-up with hematology/oncology Dr. Delton Coombes in 2 weeks to review biopsy results and follow-up regarding right lymphoma 3. Follow-up with Dr. Alyson Ingles of urology in 4 weeks with repeat renal ultrasound to reevaluate stent placement 4. Follow-up with Dr. Constance Haw of general surgery as scheduled 5. Continue on Eliquis as prescribed for DVT treatment and likely PE 6. Continue on home oxygen 7. Tramadol prescribed 15 tablets as needed for moderate or severe pain  Home Health: None  Equipment/Devices: 2 L nasal cannula oxygen  Discharge Condition: Stable  CODE STATUS: Full  Diet recommendation: Heart Healthy  Brief/Interim Summary: 63 year old male with a history of hypertension, remote history of tobacco use who quit 20 years ago, admitted to the hospital with sudden onset of shortness of breath of 1 day duration. Initial imaging indicated possible pulmonary vascular congestion, mild elevation of BNP and troponin. He was noted to have significant hypertension on arrival. He received a dose of Lasix and was admitted for further treatment. Echocardiogram showed significantly elevated pulmonary artery pressure in the 70s. D-dimer was elevated greater than 20. Venous Dopplers of lower extremities showed bilateral DVTs. Although VQ scan with low probability for PE, and current clinical scenario was felt likely that he did not fact have a pulmonary embolus. Pulmonology following. He is currently on intravenous heparin. Further work-up has shown a possible right lower quadrant abdominal mass. Further work-up in process.  10/19:Patient has convincing CT findings for lymphoma. Discussed with  pulmonology, oncology as well as general surgery regarding need for excisional biopsy well hospitalized on IV heparin drip. Plan to have this done hopefully by 10/21 after which point could start to consider planning for discharge.  10/20:Discussed case with urology today who will plan to see him regarding hydronephrosis later today. Plans for excisional lymph node biopsy per general surgery in a.m. and will keep n.p.o. after midnight. No other concerns noted today patient remains on IV heparin drip for the time being.  10/21: Patient has undergone cystoscopy with bilateral JJ stent placement this morning with urology along with right inguinal lymph node excisional biopsy this morning.  He has tolerated the procedures well with no complications noted.  Foley catheter is now present with some mild hematuria.  Patient is complaining of some ongoing abdominal/suprapubic pain.  Plans to resume heparin drip at 1600.  10/22: Patient has done well overnight and Foley catheter will be removed today as discussed with urology with plans to follow-up in 4 weeks.  He has been transitioned to oral Eliquis today with stable laboratory data noted.  His creatinine has improved significantly to 0.89 today.  He is maintaining great urine output with no significant hematuria noted.  Hemoglobin remains stable.  Pathology results are still pending and patient will require close follow-up with oncology who has seen patient during this admission.  No other acute events noted throughout the course of this hospitalization.  He is otherwise stable for discharge and has been evaluated by PT with no home health needs noted.  He will require home oxygen 2 L which will be set up for him on discharge.  Discharge Diagnoses:  Active Problems:   Hypertensive urgency   Acute respiratory failure with hypoxia (HCC)   HTN (hypertension)  Pulmonary hypertension (HCC)   AKI (acute kidney injury) ([place])   Leg DVT (deep venous  thromboembolism), acute, bilateral (HCC)   Abdominal mass, RLQ (right lower quadrant)   Hydronephrosis of right kidney   Inguinal adenopathy  Principal discharge diagnosis: Acute hypoxemic respiratory failure with bilateral lower extremity DVTs and likely PE along with suspected lymphoma.  Discharge Instructions  Discharge Instructions    Diet - low sodium heart healthy   Complete by: As directed    Increase activity slowly   Complete by: As directed      Allergies as of 04/22/2019   No Known Allergies     Medication List    TAKE these medications   amLODipine 10 MG tablet Commonly known as: NORVASC Take 10 mg by mouth daily.   apixaban 5 MG Tabs tablet Commonly known as: ELIQUIS Take 2 tablets (10 mg total) by mouth 2 (two) times daily for 7 days.   apixaban 5 MG Tabs tablet Commonly known as: ELIQUIS Take 1 tablet (5 mg total) by mouth 2 (two) times daily. Start taking on: April 29, 2019   metoprolol succinate 50 MG 24 hr tablet Commonly known as: TOPROL-XL Take 50 mg by mouth daily.   traMADol 50 MG tablet Commonly known as: Ultram Take 1 tablet (50 mg total) by mouth every 6 (six) hours as needed for moderate pain or severe pain.            Durable Medical Equipment  (From admission, onward)         Start     Ordered   04/22/19 1048  DME Oxygen  Once    Question Answer Comment  Length of Need 12 Months   Mode or (Route) Nasal cannula   Liters per Minute 2   Frequency Continuous (stationary and portable oxygen unit needed)   Oxygen conserving device Yes   Oxygen delivery system Gas      04/22/19 1048         Follow-up Information    Virl Cagey, MD On 05/06/2019.   Specialty: General Surgery Why: For wound re-check Contact information: [address] [place]na [place] [facility] [address] [phone]        Dione Housekeeper, MD Follow up in 1 week(s).   Specialty: Family Medicine Contact information: [place]  [address] [phone]        Derek Jack, MD Follow up in 2 week(s).   Specialty: Hematology Contact information: [place] [address] [phone]        Cleon Gustin, MD Follow up in 4 week(s).   Specialty: Urology Contact information: [address] [place] [address] [phone]          No Known Allergies  Consultations:  General surgery  Urology  Oncology  Pulmonology   Procedures/Studies: Ct Abdomen Pelvis Wo Contrast  Result Date: 04/18/2019 CLINICAL DATA:  Non pulsatile abdominal mass. Hydronephrosis. Bilateral lower extremity DVTs. EXAM: CT ABDOMEN AND PELVIS WITHOUT CONTRAST TECHNIQUE: Multidetector CT imaging of the abdomen and pelvis was performed following the standard protocol without IV contrast. COMPARISON:  Chest CT earlier this day. FINDINGS: Lower chest: Patchy ground-glass airspace disease as seen on chest CT earlier this day. Small left pleural effusions/thickening. Hepatobiliary: No evidence of focal hepatic lesion, limited evaluation due to lack of IV contrast. Question of nodular hepatic contours. Gallbladder physiologically distended, no calcified stone. No biliary dilatation, however distal common bile duct not well-defined. Pancreas: Large soft tissue mass  in the central abdomen involves the body, possibly neck of the pancreas. No distal ductal dilatation. No definite frank peripancreatic inflammation. Spleen: Normal in size without focal abnormality. Spleen measures 10.4 cm. Adrenals/Urinary Tract: Low-density 18 mm left adrenal nodule is most consistent with adenoma. Inferior aspect of the adrenal gland is poorly defined may be contiguous with the large central abdominal mass. The right adrenal gland is normal. Mild to moderate hydronephrosis. Right perinephric edema and thickening of the perirenal fascia. Possible thickening of the right proximal ureter and renal pelvis, for example series 2, image  35. Distal ureter is ill-defined. Fullness of the left renal collecting system without definitive hydronephrosis. There is left perinephric edema and thickening of the perirenal fascia. Ill-defined density in the region of the left ureter, not well assessed in the absence of contrast. Urinary bladder is minimally distended but thick walled. There is perivesicular edema. Stomach/Bowel: Nondistended stomach. Central abdominal mass abutting the fourth portion of the duodenum and proximal jejunum. Evidence of gastric outlet obstruction. Mass travels along the root of the mesentery. No bowel obstruction. Administered enteric contrast opacifies normal ileal small bowel loops and colon. Normal appendix. Vascular/Lymphatic: Bulky right external iliac adenopathy, nodal conglomerate measuring 5.4 x 3.8 cm, series 2, image 75. There are enlarged right inguinal nodes measuring up to 2.3 cm short axis. Prominent enlarged left inguinal lymph nodes measuring up to 2.4 cm. Right pelvic sidewall adenopathy is well-defined in the absence of IV contrast. Central abdominal mass at the root of the mesentery may represent a large nodal conglomerate, exact measurements are difficult due to the ill-defined shape, however this measures approximately 12 x 7.2 x 11.2 cm. Additional enlarged adjacent nodes in the mesentery. Presumed bilateral retroperitoneal adenopathy, left periaortic ill-defined node series 2, image 30. Enlarged lymph nodes at the root of the celiac axis, porta hepatis, and small nodules in the right perinephric space superiorly. Advanced aortic atherosclerosis. No evidence of aortic aneurysm. Reproductive: Prostate is unremarkable. Other: Fat in both inguinal canals. Large central mesenteric mass as described above has surrounding edema and soft tissue stranding. Bilateral retroperitoneal stranding. No definite ascites. No free air. Patchy edema in the right lower anterior abdominal wall is nonspecific. 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 No focal bone lesion. Intervertebral disc calcifications noted in the lower thoracic spine. IMPRESSION: 1. Dominant soft tissue mass in the proximal mesentery measuring up to 12 cm. There is also a multifocal adenopathy throughout the abdomen and pelvis. In conjunction with thoracic adenopathy, findings highly suspicious for lymphoproliferative disorder such as lymphoma. Metastatic disease is also considered. Enlarged inguinal nodes (right greater than left) may be an easily accessible option for tissue sampling. 2. Right hydronephrosis and fullness of the left renal collecting system, this may be secondary to mass effect from central abdominal mass or adenopathy. There is bilateral proximal ureteral thickening. 3. Possible nodular hepatic contours, raising concern for cirrhosis. Recommend correlation for cirrhotic risk factors. 4. Advanced aortic atherosclerosis. Aortic Atherosclerosis (ICD10-I70.0). Electronically Signed   By: Keith Rake M.D.   On: 04/18/2019 23:23   Dg Chest 2 View  Result Date: 04/18/2019 CLINICAL DATA:  DVT.  Acute respiratory failure with hypoxia. EXAM: CHEST - 2 VIEW COMPARISON:  04/17/2019; bilateral lower extremity venous Doppler ultrasound-earlier same day FINDINGS: Grossly unchanged enlarged cardiac silhouette and mediastinal contours with nodular prominence of the bilateral pulmonary hila. Atherosclerotic plaque within the thoracic aorta. There is persistent thickening the right paratracheal stripe presumably  secondary prominent vasculature. Ill-defined heterogeneous potential airspace opacities within the right mid and left lower lung are unchanged. Mild cephalization of flow without frank evidence of edema. No pleural effusion or pneumothorax. No evidence of edema. IMPRESSION: 1. Grossly unchanged ill-defined heterogeneous opacities within the right upper and left lower lungs, potentially  atelectasis or asymmetric pulmonary edema though developing infection could have a similar appearance. Additionally, in the setting of known lower extremity DVT, conceivably pulmonary infarction could have a similar appearance. 2. Redemonstrated prominence of the bilateral pulmonary hila, potentially secondary to prominent pulmonary vasculature though given presence of potential left inguinal adenopathy (demonstrated on preceding lower extremity Doppler ultrasound), conceivably hilar adenopathy could have a similar appearance. Further evaluation with chest CT (preferably contrast-enhanced) could be performed as clinically indicated. 3. Pulmonary venous congestion without frank evidence of edema. Electronically Signed   By: Sandi Mariscal M.D.   On: 04/18/2019 11:24   Ct Chest Wo Contrast  Result Date: 04/18/2019 CLINICAL DATA:  Shortness of breath upon arrival to ED yesterday. Acute hypoxic respiratory failure. COVID-19 test is negative. EXAM: CT CHEST WITHOUT CONTRAST TECHNIQUE: Multidetector CT imaging of the chest was performed following the standard protocol without IV contrast. COMPARISON:  Two-view chest x-ray 04/18/2019 FINDINGS: Cardiovascular: Heart size is normal. Coronary artery calcifications are present. Atherosclerotic changes are present in the aortic arch and at the great vessel origins. The left vertebral artery originates from the arch. There is no aneurysm. Pulmonary artery size is within normal limits. Mediastinum/Nodes: Subcarinal in bilateral hilar adenopathy is present, left greater than right. Enlarged axillary nodes are present bilaterally. Left axillary node measures 1.7 cm in short axis. Right axillary node measures 1.6 cm in short axis. Subcarinal node measures 2.5 cm in short axis. Lungs/Pleura: Patchy bilateral airspace opacities are present bilaterally this is most prominent in the right upper lobe and superior segment of the right lower lobe. There is some prominence in the left  lower lobe. Left pleural effusion is present. The airspace disease is more central than peripheral. There is no pneumothorax. No significant right-sided pleural effusion is present. Upper Abdomen: Edematous changes are present at the kidneys bilaterally. Cortical lymph nodes are suspected. Musculoskeletal: Vertebral body heights alignment are maintained. No focal lytic or blastic lesions are present. The ribs are unremarkable. IMPRESSION: 1. Patchy bilateral airspace disease. Findings are most worrisome for infection. 2. Mediastinal and bilateral hilar adenopathy, left greater than right. While this may be related to the acute infectious/inflammatory process, neoplasm is not excluded. There are no calcifications to suggest granulomatous disease. Sarcoid is not excluded. 3. Bilateral axillary adenopathy is also concerning for malignancy. 4.  Aortic Atherosclerosis (ICD10-I70.0). Electronically Signed   By: San Morelle M.D.   On: 04/18/2019 15:47   US Renal  Result Date: 04/18/2019 CLINICAL DATA:  Acute renal insufficiency. EXAM: RENAL / URINARY TRACT ULTRASOUND COMPLETE COMPARISON:  None. FINDINGS: Right Kidney: Renal measurements: 12.6 x 6.3 x 8.4 cm = volume: 347 mL. Mild left hydronephrosis identified. Left Kidney: Renal measurements: 13 x 6.9 x 7.2 cm = volume: 336 mL. Echogenicity within normal limits. No mass or hydronephrosis visualized. Bladder: The bladder is decompressed. Other: There is a complex mass in the right side of the pelvis/right lower quadrant with some internal blood flow. IMPRESSION: 1. There is a complex mass in the right lower quadrant/right pelvis, with internal blood flow. A CT scan may better evaluate. Malignancy not excluded. 2. Mild hydronephrosis associated with the right kidney, age indeterminate. Recommend clinical correlation. Electronically  Signed   By: Dorise Bullion III M.D   On: 04/18/2019 12:50   Nm Pulmonary Vent And Perf (v/q Scan)  Result Date:  04/17/2019 CLINICAL DATA:  Shortness of breath since 3 o'clock this morning. Evaluate for pulmonary embolism. EXAM: NUCLEAR MEDICINE VENTILATION - PERFUSION LUNG SCAN TECHNIQUE: Ventilation images were obtained in multiple projections using inhaled aerosol Tc-54m DTPA. Perfusion images were obtained in multiple projections after intravenous injection of Tc-16m MAA. RADIOPHARMACEUTICALS:  41 mCi of Tc-7m DTPA aerosol inhalation and 1.5 mCi Tc77m MAA IV COMPARISON:  Chest radiograph-earlier same day FINDINGS: Review of chest radiograph performed earlier same day demonstrates ill-defined heterogeneous opacities within the right upper and left lower lung. Ventilation: Ventilatory images demonstrate clumping of inhaled radiotracer about the bilateral pulmonary hila. There is an ill-defined area of decreased ventilation involving the peripheral aspect of the right upper lung regional to the expected location of the potential developing airspace opacity demonstrated on preceding chest radiograph. No additional areas of non ventilation are identified. Ingested radiotracer is seen within the esophagus and stomach. Perfusion: There is a matched area of decreased perfusion involving the peripheral aspect of the right upper lung correlating with the potential developing airspace opacity seen on preceding chest radiograph. Otherwise, there is relative homogeneous perfusion of the bilateral pulmonary parenchyma without definitive mismatched perfusion defect. IMPRESSION: Very low probability of pulmonary embolism - triple matched defect involving the peripheral aspect of the right upper lung corresponding with the potential developing airspace opacity questioned on preceding chest radiograph. Electronically Signed   By: Sandi Mariscal M.D.   On: 04/17/2019 11:16   US Venous Img Lower Bilateral  Result Date: 04/18/2019 CLINICAL DATA:  Bilateral lower extremity pain and edema for the past week. Shortness of breath. Patient is on  heparin. Evaluate for DVT. EXAM: BILATERAL LOWER EXTREMITY VENOUS DOPPLER ULTRASOUND TECHNIQUE: Gray-scale sonography with graded compression, as well as color Doppler and duplex ultrasound were performed to evaluate the lower extremity deep venous systems from the level of the common femoral vein and including the common femoral, femoral, profunda femoral, popliteal and calf veins including the posterior tibial, peroneal and gastrocnemius veins when visible. The superficial great saphenous vein was also interrogated. Spectral Doppler was utilized to evaluate flow at rest and with distal augmentation maneuvers in the common femoral, femoral and popliteal veins. COMPARISON:  Nuclear medicine V/Q scan-04/17/2019; chest radiograph-earlier same day FINDINGS: RIGHT LOWER EXTREMITY There is hypoechoic expansile near occlusive thrombus within the right common femoral vein (image 3), extending to involve the saphenofemoral junction (image 9). There is hypoechoic near occlusive thrombus involving the proximal aspect of the right deep femoral vein (image 12). There is hypoechoic occlusive thrombus involving the proximal (image 14) mid (image 17) and distal (image 22) aspects of the right femoral vein. There is hypoechoic nonocclusive thrombus within the right popliteal vein (image 27 extending to involve the right posterior tibial (image 31) and peroneal (image 33) veins. The anterior tibial vein appears patent where imaged. Other Findings:  None. _________________________________________________________ LEFT LOWER EXTREMITY Common Femoral Vein: No evidence of thrombus. Normal compressibility, respiratory phasicity and response to augmentation. Saphenofemoral Junction: No evidence of thrombus. Normal compressibility and flow on color Doppler imaging. Profunda Femoral Vein: No evidence of thrombus. Normal compressibility and flow on color Doppler imaging. Femoral Vein: No evidence of thrombus. Normal compressibility,  respiratory phasicity and response to augmentation. Popliteal Vein: No evidence of thrombus. Normal compressibility, respiratory phasicity and response to augmentation. Calf Veins: There is hypoechoic occlusive expansile  thrombus within the left posterior tibial (image 72) as well as the left peroneal (image 75) veins. The anterior tibial vein appears patent where imaged. Superficial Great Saphenous Vein: No evidence of thrombus. Normal compressibility. Other Findings: There is an approximately 2.8 x 3.2 x 2.7 cm hypoechoic nodule within the left groin which appears to demonstrate internal blood flow and may represent a pathologically enlarged left inguinal lymph node. IMPRESSION: 1. The examination is positive for rather extensive predominantly occlusive right lower extremity DVT extending from the right common femoral vein through the imaged tibial veins. 2. The examination is positive for occlusive DVT involving the left posterior tibial and peroneal veins. 3. Indeterminate approximately 3.2 cm hypoechoic nodule within left groin, indeterminate though could represent a pathologically enlarged lymph node. Further evaluation with contrast-enhanced CT scan could be performed as indicated. Electronically Signed   By: Sandi Mariscal M.D.   On: 04/18/2019 11:19   Dg Chest Port 1 View  Result Date: 04/17/2019 CLINICAL DATA:  Shortness of breath beginning yesterday. Mild productive cough. No known fevers. EXAM: PORTABLE CHEST 1 VIEW COMPARISON:  None. FINDINGS: Borderline enlarged cardiac silhouette and mediastinal contours, potentially accentuated due to slightly reduced lung volumes and AP projection. Ill defined heterogeneous opacities are seen within the right upper and left lower lungs. Mild pulmonary venous congestion without frank evidence of edema. No pleural effusion or pneumothorax. No acute osseous abnormalities. IMPRESSION: 1. Potential developing airspace opacities within the right upper and left lower  lung, potentially atelectasis though could be seen in the setting of multifocal infection. Clinical correlation is advised. Further evaluation with a PA and lateral chest radiograph may be obtained as clinically indicated. 2. Pulmonary venous congestion without frank evidence of edema. Electronically Signed   By: Sandi Mariscal M.D.   On: 04/17/2019 04:22   Dg C-arm 1-60 Min-no Report  Result Date: 04/21/2019 Fluoroscopy was utilized by the requesting physician.  No radiographic interpretation.     Discharge Exam: Vitals:   04/22/19 0915 04/22/19 0939  BP:    Pulse:    Resp:    Temp:    SpO2: (!) 84% 90%   Vitals:   04/21/19 2340 04/22/19 0519 04/22/19 0915 04/22/19 0939  BP: 140/70 (!) 159/65    Pulse: (!) 57 65    Resp: 20 18    Temp: 98.4 F (36.9 C) 98.9 F (37.2 C)    TempSrc: Oral Oral    SpO2: 100% 96% (!) 84% 90%  Weight:      Height:        General: Pt is alert, awake, not in acute distress Cardiovascular: RRR, S1/S2 +, no rubs, no gallops Respiratory: CTA bilaterally, no wheezing, no rhonchi, on 2 L nasal cannula oxygen. Abdominal: Soft, NT, ND, bowel sounds + Extremities: no edema, no cyanosis    The results of significant diagnostics from this hospitalization (including imaging, microbiology, ancillary and laboratory) are listed below for reference.     Microbiology: Recent Results (from the past 240 hour(s))  SARS Coronavirus 2 by RT PCR (hospital order, performed in [facility] hospital lab) Nasopharyngeal Nasopharyngeal Swab     Status: None   Collection Time: 04/17/19  3:49 AM   Specimen: Nasopharyngeal Swab  Result Value Ref Range Status   SARS Coronavirus 2 NEGATIVE NEGATIVE Final    Comment: (NOTE) If result is NEGATIVE SARS-CoV-2 target nucleic acids are NOT DETECTED. The SARS-CoV-2 RNA is generally detectable in upper and lower  respiratory specimens during the acute phase of  infection. The lowest  concentration of SARS-CoV-2 viral copies this  assay can detect is 250  copies / mL. A negative result does not preclude SARS-CoV-2 infection  and should not be used as the sole basis for treatment or other  patient management decisions.  A negative result may occur with  improper specimen collection / handling, submission of specimen other  than nasopharyngeal swab, presence of viral mutation(s) within the  areas targeted by this assay, and inadequate number of viral copies  (<250 copies / mL). A negative result must be combined with clinical  observations, patient history, and epidemiological information. If result is POSITIVE SARS-CoV-2 target nucleic acids are DETECTED. The SARS-CoV-2 RNA is generally detectable in upper and lower  respiratory specimens dur ing the acute phase of infection.  Positive  results are indicative of active infection with SARS-CoV-2.  Clinical  correlation with patient history and other diagnostic information is  necessary to determine patient infection status.  Positive results do  not rule out bacterial infection or co-infection with other viruses. If result is PRESUMPTIVE POSTIVE SARS-CoV-2 nucleic acids MAY BE PRESENT.   A presumptive positive result was obtained on the submitted specimen  and confirmed on repeat testing.  While 2019 novel coronavirus  (SARS-CoV-2) nucleic acids may be present in the submitted sample  additional confirmatory testing may be necessary for epidemiological  and / or clinical management purposes  to differentiate between  SARS-CoV-2 and other Sarbecovirus currently known to infect humans.  If clinically indicated additional testing with an alternate test  methodology [phone]) is advised. The SARS-CoV-2 RNA is generally  detectable in upper and lower respiratory sp ecimens during the acute  phase of infection. The expected result is Negative. Fact Sheet for Patients:  StrictlyIdeas.no Fact Sheet for Healthcare  Providers: BankingDealers.co.za This test is not yet approved or cleared by the [place] FDA and has been authorized for detection and/or diagnosis of SARS-CoV-2 by FDA under an Emergency Use Authorization (EUA).  This EUA will remain in effect (meaning this test can be used) for the duration of the COVID-19 declaration under Section 564(b)(1) of the Act, 21 U.S.C. section 360bbb-3(b)(1), unless the authorization is terminated or revoked sooner. Performed at [facility], [address], [place], [place] [address]   Blood Culture (routine x 2)     Status: None   Collection Time: 04/17/19  4:20 AM   Specimen: Right Antecubital; Blood  Result Value Ref Range Status   Specimen Description RIGHT ANTECUBITAL  Final   Special Requests   Final    BOTTLES DRAWN AEROBIC AND ANAEROBIC Blood Culture adequate volume   Culture   Final    NO GROWTH 5 DAYS Performed at [facility], [address], [place], [place] [address]    Report Status 04/22/2019 FINAL  Final  Blood Culture (routine x 2)     Status: None   Collection Time: 04/17/19  4:30 AM   Specimen: Left Antecubital; Blood  Result Value Ref Range Status   Specimen Description LEFT ANTECUBITAL  Final   Special Requests   Final    BOTTLES DRAWN AEROBIC AND ANAEROBIC Blood Culture adequate volume   Culture   Final    NO GROWTH 5 DAYS Performed at [facility], [address] [place],  [address]    Report Status 04/22/2019 FINAL  Final  MRSA PCR Screening     Status: None   Collection Time: 04/20/19  3:23 PM   Specimen: Nasal Mucosa; Nasopharyngeal  Result  Value Ref Range Status   MRSA by PCR NEGATIVE NEGATIVE Final    Comment:        The GeneXpert MRSA Assay (FDA approved for NASAL specimens only), is one component of a comprehensive MRSA colonization surveillance program. It is not intended to diagnose MRSA infection nor to guide or monitor treatment for MRSA infections. Performed at [facility], [address], [place], [place] [address]      Labs: BNP (last 3 results) Recent Labs    04/17/19 0349  BNP 295.6*   Basic Metabolic Panel: Recent Labs  Lab 04/17/19 0349 04/18/19 0834 04/20/19 0550 04/21/19 0552 04/22/19 0600  NA 136 140 143 143 144  K 3.6 4.0 4.0 3.9 4.1  CL 106 108 108 105 107  CO2 19* 23 26 28 27   GLUCOSE 117* 108* 96 97 93  BUN 32* 36* 31* 30* 19  CREATININE 2.19* 1.98* 1.96* 1.80* 0.89  CALCIUM 9.9 8.9 8.7* 9.7 8.8*   Liver Function Tests: Recent Labs  Lab 04/17/19 0349 04/18/19 0834  AST 18 14*  ALT 14 13  ALKPHOS 89 67  BILITOT 0.4 0.6  PROT 7.0 5.8*  ALBUMIN 3.7 3.1*   No results for input(s): LIPASE, AMYLASE in the last 168 hours. No results for input(s): AMMONIA in the last 168 hours. CBC: Recent Labs  Lab 04/17/19 0349 04/18/19 0834 04/19/19 0555 04/20/19 0550 04/21/19 0552 04/22/19 0600  WBC 10.5 8.0 7.4 6.5 7.5 8.2  NEUTROABS 7.7  --   --   --   --   --   HGB 14.3 12.2* 11.8* 11.2* 13.1 11.6*  HCT 42.0 35.9* 35.3* 33.7* 40.9 36.8*  MCV 92.5 93.0 94.1 94.9 96.7 98.1  PLT 134* 123* 134* 147* 211 159   Cardiac Enzymes: No results for input(s): CKTOTAL, CKMB, CKMBINDEX, TROPONINI in the last 168 hours. BNP: Invalid input(s): POCBNP CBG: No results for input(s): GLUCAP in the last 168 hours. D-Dimer No results for input(s): DDIMER in the last 72 hours. Hgb A1c No results for input(s): HGBA1C in the last 72 hours. Lipid Profile No results for input(s): CHOL, HDL, LDLCALC, TRIG, CHOLHDL, LDLDIRECT in the last 72 hours. Thyroid function studies No results for input(s): TSH, T4TOTAL, T3FREE, THYROIDAB in the last 72 hours.  Invalid input(s): FREET3 Anemia work up No results for input(s): VITAMINB12, FOLATE, FERRITIN, TIBC, IRON, RETICCTPCT in the last 72 hours. Urinalysis    Component Value Date/Time   COLORURINE YELLOW 04/17/2019 1945   APPEARANCEUR CLEAR 04/17/2019 1945   LABSPEC 1.012 04/17/2019 1945    PHURINE 5.0 04/17/2019 1945   GLUCOSEU NEGATIVE 04/17/2019 1945   HGBUR LARGE (A) 04/17/2019 [place] NEGATIVE 04/17/2019 [place] NEGATIVE 04/17/2019 1945   PROTEINUR 30 (A) 04/17/2019 1945   NITRITE NEGATIVE 04/17/2019 1945   LEUKOCYTESUR NEGATIVE 04/17/2019 1945   Sepsis Labs Invalid input(s): PROCALCITONIN,  WBC,  LACTICIDVEN Microbiology Recent Results (from the past 240 hour(s))  SARS Coronavirus 2 by RT PCR (hospital order, performed in [place] hospital lab) Nasopharyngeal Nasopharyngeal Swab     Status: None   Collection Time: 04/17/19  3:49 AM   Specimen: Nasopharyngeal Swab  Result Value Ref Range Status   SARS Coronavirus 2 NEGATIVE NEGATIVE Final    Comment: (NOTE) If result is NEGATIVE SARS-CoV-2 target nucleic acids are NOT DETECTED. The SARS-CoV-2 RNA is generally detectable in upper and lower  respiratory specimens during the acute phase of infection. The lowest  concentration of SARS-CoV-2 viral  copies this assay can detect is 250  copies / mL. A negative result does not preclude SARS-CoV-2 infection  and should not be used as the sole basis for treatment or other  patient management decisions.  A negative result may occur with  improper specimen collection / handling, submission of specimen other  than nasopharyngeal swab, presence of viral mutation(s) within the  areas targeted by this assay, and inadequate number of viral copies  (<250 copies / mL). A negative result must be combined with clinical  observations, patient history, and epidemiological information. If result is POSITIVE SARS-CoV-2 target nucleic acids are DETECTED. The SARS-CoV-2 RNA is generally detectable in upper and lower  respiratory specimens dur ing the acute phase of infection.  Positive  results are indicative of active infection with SARS-CoV-2.  Clinical  correlation with patient history and other diagnostic information is  necessary to determine patient  infection status.  Positive results do  not rule out bacterial infection or co-infection with other viruses. If result is PRESUMPTIVE POSTIVE SARS-CoV-2 nucleic acids MAY BE PRESENT.   A presumptive positive result was obtained on the submitted specimen  and confirmed on repeat testing.  While 2019 novel coronavirus  (SARS-CoV-2) nucleic acids may be present in the submitted sample  additional confirmatory testing may be necessary for epidemiological  and / or clinical management purposes  to differentiate between  SARS-CoV-2 and other Sarbecovirus currently known to infect humans.  If clinically indicated additional testing with an alternate test  methodology [phone]) is advised. The SARS-CoV-2 RNA is generally  detectable in upper and lower respiratory sp ecimens during the acute  phase of infection. The expected result is Negative. Fact Sheet for Patients:  StrictlyIdeas.no Fact Sheet for Healthcare Providers: BankingDealers.co.za This test is not yet approved or cleared by the [place] FDA and has been authorized for detection and/or diagnosis of SARS-CoV-2 by FDA under an Emergency Use Authorization (EUA).  This EUA will remain in effect (meaning this test can be used) for the duration of the COVID-19 declaration under Section 564(b)(1) of the Act, 21 U.S.C. section 360bbb-3(b)(1), unless the authorization is terminated or revoked sooner. Performed at [facility], [address], [place], [place] [address]   Blood Culture (routine x 2)     Status: None   Collection Time: 04/17/19  4:20 AM   Specimen: Right Antecubital; Blood  Result Value Ref Range Status   Specimen Description RIGHT ANTECUBITAL  Final   Special Requests   Final    BOTTLES DRAWN AEROBIC AND ANAEROBIC Blood Culture adequate volume   Culture   Final    NO GROWTH 5 DAYS Performed at [facility], [address] [place], [place] [address]    Report Status  04/22/2019 FINAL  Final  Blood Culture (routine x 2)     Status: None   Collection Time: 04/17/19  4:30 AM   Specimen: Left Antecubital; Blood  Result Value Ref Range Status   Specimen Description LEFT ANTECUBITAL  Final   Special Requests   Final    BOTTLES DRAWN AEROBIC AND ANAEROBIC Blood Culture adequate volume   Culture   Final    NO GROWTH 5 DAYS Performed at [facility], [address], [place], [place] [address]    Report Status 04/22/2019 FINAL  Final  MRSA PCR Screening     Status: None   Collection Time: 04/20/19  3:23 PM   Specimen: Nasal Mucosa; Nasopharyngeal  Result Value Ref Range Status   MRSA by  PCR NEGATIVE NEGATIVE Final    Comment:        The GeneXpert MRSA Assay (FDA approved for NASAL specimens only), is one component of a comprehensive MRSA colonization surveillance program. It is not intended to diagnose MRSA infection nor to guide or monitor treatment for MRSA infections. Performed at [facility], [address], [place], [place] [address]      Time coordinating discharge: 40 minutes  SIGNED:   Rodena Goldmann, DO [facility] 04/22/2019, 11:08 AM  If 7PM-7AM, please contact night-coverage [email or web address]

## 2019-04-23 ENCOUNTER — Telehealth: Payer: Self-pay | Admitting: General Surgery

## 2019-04-23 NOTE — Telephone Encounter (Addendum)
[facility]  Called patient and wife to notify him with his results. His number and wife's home number not in service. He had given me a number for his wife, and I was able to leave a message on this account. He has an appt with Dr. Delton Coombes 04/27/19.   Will try to get back in touch with patient to tell him his results on Monday.   Pathology: Clinical History: adenopathy, bilateral hydronephrosis   FINAL MICROSCOPIC DIAGNOSIS:   A. 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  Curlene Labrum, MD [facility] [address] [place], [place] [address] [phone] (office)

## 2019-04-26 ENCOUNTER — Other Ambulatory Visit: Payer: Self-pay

## 2019-04-26 ENCOUNTER — Telehealth: Payer: Self-pay | Admitting: General Surgery

## 2019-04-26 ENCOUNTER — Encounter (HOSPITAL_COMMUNITY): Payer: Self-pay | Admitting: Hematology

## 2019-04-26 ENCOUNTER — Inpatient Hospital Stay (HOSPITAL_COMMUNITY): Payer: BLUE CROSS/BLUE SHIELD | Attending: Hematology | Admitting: Hematology

## 2019-04-26 VITALS — BP 166/64 | HR 100 | Temp 97.9°F | Resp 18 | Wt 201.0 lb

## 2019-04-26 DIAGNOSIS — M7989 Other specified soft tissue disorders: Secondary | ICD-10-CM | POA: Diagnosis not present

## 2019-04-26 DIAGNOSIS — N1330 Unspecified hydronephrosis: Secondary | ICD-10-CM | POA: Diagnosis not present

## 2019-04-26 DIAGNOSIS — C8338 Diffuse large B-cell lymphoma, lymph nodes of multiple sites: Secondary | ICD-10-CM | POA: Diagnosis not present

## 2019-04-26 DIAGNOSIS — K7460 Unspecified cirrhosis of liver: Secondary | ICD-10-CM | POA: Insufficient documentation

## 2019-04-26 DIAGNOSIS — G479 Sleep disorder, unspecified: Secondary | ICD-10-CM | POA: Insufficient documentation

## 2019-04-26 DIAGNOSIS — R319 Hematuria, unspecified: Secondary | ICD-10-CM | POA: Diagnosis not present

## 2019-04-26 DIAGNOSIS — C8330 Diffuse large B-cell lymphoma, unspecified site: Secondary | ICD-10-CM | POA: Diagnosis not present

## 2019-04-26 DIAGNOSIS — I82403 Acute embolism and thrombosis of unspecified deep veins of lower extremity, bilateral: Secondary | ICD-10-CM

## 2019-04-26 DIAGNOSIS — R8290 Unspecified abnormal findings in urine: Secondary | ICD-10-CM | POA: Insufficient documentation

## 2019-04-26 DIAGNOSIS — M79604 Pain in right leg: Secondary | ICD-10-CM | POA: Insufficient documentation

## 2019-04-26 HISTORY — DX: Diffuse large B-cell lymphoma, unspecified site: C83.30

## 2019-04-26 LAB — URIC ACID: Uric Acid, Serum: 5.9 mg/dL (ref 3.7–8.6)

## 2019-04-26 LAB — HEPATITIS B CORE ANTIBODY, TOTAL: Hep B Core Total Ab: NONREACTIVE

## 2019-04-26 LAB — SEDIMENTATION RATE: Sed Rate: 18 mm/hr — ABNORMAL HIGH (ref 0–16)

## 2019-04-26 LAB — HEPATITIS B SURFACE ANTIBODY,QUALITATIVE: Hep B S Ab: NONREACTIVE

## 2019-04-26 LAB — HEPATITIS B SURFACE ANTIGEN: Hepatitis B Surface Ag: NONREACTIVE

## 2019-04-26 MED ORDER — HYDROCODONE-ACETAMINOPHEN 5-325 MG PO TABS: 1.0000 | ORAL_TABLET | Freq: Three times a day (TID) | ORAL | 0 refills | Status: DC | PRN

## 2019-04-26 NOTE — Progress Notes (Signed)
START ON PATHWAY REGIMEN - Lymphoma and CLL     A cycle is every 21 days:     Prednisone      Rituximab-xxxx      Cyclophosphamide      Doxorubicin      Vincristine   **Always confirm dose/schedule in your pharmacy ordering system**  Patient Characteristics: Diffuse Large B-Cell Lymphoma or Follicular Lymphoma, Grade 3B, First Line, Stage III and IV Disease Type: Not Applicable Disease Type: Diffuse Large B-Cell Lymphoma Disease Type: Not Applicable Line of therapy: First Line [place] Stage: III Intent of Therapy: Curative Intent, Discussed with Patient 

## 2019-04-26 NOTE — Telephone Encounter (Signed)
[facility]  Attempted Mr. Gerling wife again as this was the only number that had a voicemail. She answered. Told her the pathology was lymphoma. They have appt with Dr. Delton Coombes today at 2:50pm.   Curlene Labrum, MD [facility] [address] [place], [place] [address] [phone]/ [phone] (office)

## 2019-04-26 NOTE — Progress Notes (Signed)
[place], [place] [address]   CLINIC:  Medical Oncology/Hematology  PCP:  L[facility] [place], [place] [address] [phone]   REASON FOR VISIT:  Follow-up for diffuse large B-cell lymphoma.  CURRENT THERAPY: Work-up for chemotherapy.  BRIEF ONCOLOGIC HISTORY:  Oncology History  Diffuse large B-cell lymphoma of lymph nodes of multiple regions ([place])  04/26/2019 Initial Diagnosis   Diffuse large B-cell lymphoma of lymph nodes of multiple regions ([place])   05/11/2019 -  Chemotherapy   The patient had DOXOrubicin (ADRIAMYCIN) chemo injection 104 mg, 50 mg/m2, Intravenous,  Once, 0 of 6 cycles palonosetron (ALOXI) injection 0.25 mg, 0.25 mg, Intravenous,  Once, 0 of 6 cycles pegfilgrastim-jmdb (FULPHILA) injection 6 mg, 6 mg, Subcutaneous,  Once, 0 of 6 cycles vinCRIStine (ONCOVIN) 2 mg in sodium chloride 0.9 % 50 mL chemo infusion, 2 mg, Intravenous,  Once, 0 of 6 cycles cyclophosphamide (CYTOXAN) 1,540 mg in sodium chloride 0.9 % 250 mL chemo infusion, 750 mg/m2, Intravenous,  Once, 0 of 6 cycles fosaprepitant (EMEND) 150 mg, dexamethasone (DECADRON) 12 mg in sodium chloride 0.9 % 145 mL IVPB, , Intravenous,  Once, 0 of 6 cycles riTUXimab-pvvr (RUXIENCE) 800 mg in sodium chloride 0.9 % 250 mL (2.4242 mg/mL) infusion, 375 mg/m2, Intravenous,  Once, 0 of 6 cycles  for chemotherapy treatment.       CANCER STAGING: Cancer Staging No matching staging information was found for the patient.   INTERVAL HISTORY:  Mr. Schaab 63 y.o. male seen in evaluation for lymphadenopathy.  He was recently in the hospital and discharged few days ago.  He is tolerating Eliquis very well.  He continues to have some pinkish urine.  He reported pain in the right leg, sometimes unbearable.  He is taking tramadol as needed which is not helping.  Denies any fevers or night sweats or weight loss.  Complains of leg swelling.  Having difficulty  sleeping due to pain.  Appetite is 25%.    REVIEW OF SYSTEMS:  Review of Systems  Musculoskeletal:       Right leg pain.  Psychiatric/Behavioral: Positive for sleep disturbance.  All other systems reviewed and are negative.    PAST MEDICAL/SURGICAL HISTORY:  Past Medical History:  Diagnosis Date  . Diffuse large B cell lymphoma ([place]) 04/26/2019  . Hypertension    Past Surgical History:  Procedure Laterality Date  . CYSTOSCOPY W/ URETERAL STENT PLACEMENT Bilateral 04/21/2019   Procedure: CYSTOSCOPY WITH RETROGRADE PYELOGRAM/URETERAL STENT PLACEMENT;  Surgeon: Cleon Gustin, MD;  Location: [facility];  Service: Urology;  Laterality: Bilateral;  . INGUINAL LYMPH NODE BIOPSY Right 04/21/2019   Procedure: INGUINAL LYMPH NODE BIOPSY;  Surgeon: Virl Cagey, MD;  Location: [facility];  Service: General;  Laterality: Right;     SOCIAL HISTORY:  Social History   Socioeconomic History  . Marital status: Married    Spouse name: Not on file  . Number of children: Not on file  . Years of education: Not on file  . Highest education level: Not on file  Occupational History  . Not on file  Social Needs  . Financial resource strain: Not on file  . Food insecurity    Worry: Not on file    Inability: Not on file  . Transportation needs    Medical: Not on file    Non-medical: Not on file  Tobacco Use  . Smoking status: Never Smoker  . Smokeless tobacco: Never Used  Substance and Sexual Activity  . Alcohol use: Yes  . Drug use: Never  . Sexual activity: Not on file  Lifestyle  . Physical activity    Days per week: Not on file    Minutes per session: Not on file  . Stress: Not on file  Relationships  . Social Herbalist on phone: Not on file    Gets together: Not on file    Attends religious service: Not on file    Active member of club or organization: Not on file    Attends meetings of clubs or organizations: Not on file    Relationship status: Not on file   . Intimate partner violence    Fear of current or ex partner: Not on file    Emotionally abused: Not on file    Physically abused: Not on file    Forced sexual activity: Not on file  Other Topics Concern  . Not on file  Social History Narrative  . Not on file    FAMILY HISTORY:  Family History  Problem Relation Age of Onset  . Leukemia Brother     CURRENT MEDICATIONS:  Outpatient Encounter Medications as of 04/26/2019  Medication Sig  . amLODipine (NORVASC) 10 MG tablet Take 10 mg by mouth daily.  Marland Kitchen apixaban (ELIQUIS) 5 MG TABS tablet Take 2 tablets (10 mg total) by mouth 2 (two) times daily for 7 days.  Derrill Memo ON 04/29/2019] apixaban (ELIQUIS) 5 MG TABS tablet Take 1 tablet (5 mg total) by mouth 2 (two) times daily. (Patient taking differently: Take 5 mg by mouth 2 (two) times daily. Pt will start taking 1 tablet starting 04/29/19.)  . metoprolol succinate (TOPROL-XL) 50 MG 24 hr tablet Take 50 mg by mouth daily.  Marland Kitchen HYDROcodone-acetaminophen (NORCO/VICODIN) 5-325 MG tablet Take 1 tablet by mouth every 8 (eight) hours as needed for moderate pain.  . traMADol (ULTRAM) 50 MG tablet Take 1 tablet (50 mg total) by mouth every 6 (six) hours as needed for moderate pain or severe pain. (Patient not taking: Reported on 04/26/2019)   No facility-administered encounter medications on file as of 04/26/2019.     ALLERGIES:  No Known Allergies   PHYSICAL EXAM:  ECOG Performance status: 2  Vitals:   04/26/19 1547  BP: (!) 166/64  Pulse: 100  Resp: 18  Temp: 97.9 F (36.6 C)  SpO2: 94%   Filed Weights   04/26/19 1547  Weight: 201 lb (91.2 kg)    Physical Exam Vitals signs reviewed.  Constitutional:      Appearance: Normal appearance.  Cardiovascular:     Rate and Rhythm: Normal rate and regular rhythm.     Heart sounds: Normal heart sounds.  Pulmonary:     Effort: Pulmonary effort is normal.     Breath sounds: Normal breath sounds.  Abdominal:     General: There is  no distension.     Palpations: Abdomen is soft. There is no mass.  Musculoskeletal:     Right lower leg: Edema present.  Lymphadenopathy:     Cervical: No cervical adenopathy.  Skin:    General: Skin is warm.  Neurological:     General: No focal deficit present.     Mental Status: He is alert and oriented to person, place, and time.  Psychiatric:        Mood and Affect: Mood normal.        Behavior: Behavior normal.      LABORATORY DATA:  I have reviewed the labs as listed.  CBC    Component Value Date/Time   WBC 8.2 04/22/2019 0600   RBC 3.75 (L) 04/22/2019 0600   HGB 11.6 (L) 04/22/2019 0600   HCT 36.8 (L) 04/22/2019 0600   PLT 159 04/22/2019 0600   MCV 98.1 04/22/2019 0600   MCH 30.9 04/22/2019 0600   MCHC 31.5 04/22/2019 0600   RDW 12.2 04/22/2019 0600   LYMPHSABS 1.1 04/17/2019 0349   MONOABS 1.3 (H) 04/17/2019 0349   EOSABS 0.3 04/17/2019 0349   BASOSABS 0.0 04/17/2019 0349   CMP Latest Ref Rng & Units 04/22/2019 04/21/2019 04/20/2019  Glucose 70 - 99 mg/dL 93 97 96  BUN 8 - 23 mg/dL 19 30(H) 31(H)  Creatinine 0.61 - 1.24 mg/dL 0.89 1.80(H) 1.96(H)  Sodium 135 - 145 mmol/L 144 143 143  Potassium 3.5 - 5.1 mmol/L 4.1 3.9 4.0  Chloride 98 - 111 mmol/L 107 105 108  CO2 22 - 32 mmol/L 27 28 26   Calcium 8.9 - 10.3 mg/dL 8.8(L) 9.7 8.7(L)  Total Protein 6.5 - 8.1 g/dL - - -  Total Bilirubin 0.3 - 1.2 mg/dL - - -  Alkaline Phos 38 - 126 U/L - - -  AST 15 - 41 U/L - - -  ALT 0 - 44 U/L - - -       DIAGNOSTIC IMAGING:  I have independently reviewed the scans and discussed with the patient.     ASSESSMENT & PLAN:   Diffuse large B-cell lymphoma of lymph nodes of multiple regions ([place]) 1.  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  Right hydronephrosis.  Cirrhotic liver. -CT chest shows bilateral hilar and mediastinal adenopathy, bilateral  axillary adenopathy. -Right inguinal lymph node biopsy on 04/21/2019 with diffuse large B-cell lymphoma (40%) showing in the setting of follicular lymphoma (56%).  I discussed the path report with the patient and his wife in detail. -I have recommended a whole-body PET CT scan. -His echocardiogram during recent admission shows EF of 60 to 65%. -We will check his LDH, beta-2 microglobulin, uric acid, hepatitis B and C serology. -We talked about initiating him on R-CHOP chemotherapy every 3 weekly for 6 cycles.  Treatment intent for diffuse large B-cell lymphoma is curative.  However treatment intent for a follicular lymphoma is control. -We will consider maintenance rituximab after completion of R-CHOP. -We will also make referral for port placement.  2.  Bilateral leg DVT: -Ultrasound Doppler on 04/18/2019 suggestive of extensive right leg DVT and left tibial and peroneal DVT. -VQ scan was low probability for PE.  However because of shortness of breath, it was presumed that he had pulmonary embolism. -He is tolerating Eliquis very well.  However he complains of right leg pain predominantly at nighttime. -He is taking tramadol without help.  I will change it to hydrocodone 5 mg to be taken 2-3 times a day as needed.  3.  Right hydronephrosis: -Cystoscopy and bilateral JJ stent placement on 04/21/2019. -He continues to have mild hematuria.  Total time spent is 40 minutes with more than 50% of the time spent face-to-face discussing pathology report, treatment plan, counseling and coordination of care.    Orders placed this encounter:  Orders Placed This Encounter  Procedures  . NM PET Image Initial (PI) Skull Base To Thigh  . Beta 2 microglobuline, serum  . HCV RNA quant rflx ultra or genotyp  .  Hepatitis B surface antibody  . Hepatitis B surface antigen  . Uric acid  . Sedimentation rate  . Hepatitis B core antibody, total      Derek Jack, MD [place]  [phone]

## 2019-04-26 NOTE — Patient Instructions (Signed)
[place] at [facility] Discharge Instructions  You were seen today by Dr. Delton Coombes. He went over your recent test results. He will have blood work drawn today. He discussed your lymphoma and what your treatment will need to be. You have both a slow growing and fast growing lmyphoma. He will schedule you for a PET scan as well as a Port-a-Cath placement. He will see you back after your scan for labs, treament and follow up.   Thank you for choosing [place] at [facility] to provide your oncology and hematology care.  To afford each patient quality time with our provider, please arrive at least 15 minutes before your scheduled appointment time.   If you have a lab appointment with the [facility] please come in thru the  Main Entrance and check in at the main information desk  You need to re-schedule your appointment should you arrive 10 or more minutes late.  We strive to give you quality time with our providers, and arriving late affects you and other patients whose appointments are after yours.  Also, if you no show three or more times for appointments you may be dismissed from the clinic at the providers discretion.     Again, thank you for choosing [facility].  Our hope is that these requests will decrease the amount of time that you wait before being seen by our physicians.       _____________________________________________________________  Should you have questions after your visit to [facility], please contact our office at [phone] between the hours of 8:00 a.m. and 4:30 p.m.  Voicemails left after 4:00 p.m. will not be returned until the following business day.  For prescription refill requests, have your pharmacy contact our office and allow 72 hours.    Cancer Center Support Programs:   > Cancer Support Group  2nd Tuesday of the month 1pm-2pm, Journey Room

## 2019-04-26 NOTE — Progress Notes (Deleted)
[facility] NOTE  Patient Care Team: Loman Brooklyn, FNP as PCP - General (Family Medicine)  CHIEF COMPLAINTS/PURPOSE OF CONSULTATION:  ***  HISTORY OF PRESENTING ILLNESS:  Kevin [name] 63 y.o. male is here because of ***  MEDICAL HISTORY:  Past Medical History:  Diagnosis Date  . Hypertension     SURGICAL HISTORY: Past Surgical History:  Procedure Laterality Date  . CYSTOSCOPY W/ URETERAL STENT PLACEMENT Bilateral 04/21/2019   Procedure: CYSTOSCOPY WITH RETROGRADE PYELOGRAM/URETERAL STENT PLACEMENT;  Surgeon: Cleon Gustin, MD;  Location: [facility];  Service: Urology;  Laterality: Bilateral;  . INGUINAL LYMPH NODE BIOPSY Right 04/21/2019   Procedure: INGUINAL LYMPH NODE BIOPSY;  Surgeon: Virl Cagey, MD;  Location: [facility];  Service: General;  Laterality: Right;    SOCIAL HISTORY: Social History   Socioeconomic History  . Marital status: Married    Spouse name: Not on file  . Number of children: Not on file  . Years of education: Not on file  . Highest education level: Not on file  Occupational History  . Not on file  Social Needs  . Financial resource strain: Not on file  . Food insecurity    Worry: Not on file    Inability: Not on file  . Transportation needs    Medical: Not on file    Non-medical: Not on file  Tobacco Use  . Smoking status: Never Smoker  . Smokeless tobacco: Never Used  Substance and Sexual Activity  . Alcohol use: Yes  . Drug use: Never  . Sexual activity: Not on file  Lifestyle  . Physical activity    Days per week: Not on file    Minutes per session: Not on file  . Stress: Not on file  Relationships  . Social Herbalist on phone: Not on file    Gets together: Not on file    Attends religious service: Not on file    Active member of club or organization: Not on file    Attends meetings of clubs or organizations: Not on file    Relationship status: Not on file  . Intimate partner  violence    Fear of current or ex partner: Not on file    Emotionally abused: Not on file    Physically abused: Not on file    Forced sexual activity: Not on file  Other Topics Concern  . Not on file  Social History Narrative  . Not on file    FAMILY HISTORY: Family History  Problem Relation Age of Onset  . Leukemia Brother     ALLERGIES:  has No Known Allergies.  MEDICATIONS:  Current Outpatient Medications  Medication Sig Dispense Refill  . amLODipine (NORVASC) 10 MG tablet Take 10 mg by mouth daily.    Marland Kitchen apixaban (ELIQUIS) 5 MG TABS tablet Take 2 tablets (10 mg total) by mouth 2 (two) times daily for 7 days. 28 tablet 0  . [START ON 04/29/2019] apixaban (ELIQUIS) 5 MG TABS tablet Take 1 tablet (5 mg total) by mouth 2 (two) times daily. (Patient taking differently: Take 5 mg by mouth 2 (two) times daily. Pt will start taking 1 tablet starting 04/29/19.) 60 tablet 3  . metoprolol succinate (TOPROL-XL) 50 MG 24 hr tablet Take 50 mg by mouth daily.    . traMADol (ULTRAM) 50 MG tablet Take 1 tablet (50 mg total) by mouth every 6 (six) hours as needed for moderate pain or severe pain. (  Patient not taking: Reported on 04/26/2019) 15 tablet 0   No current facility-administered medications for this visit.     REVIEW OF SYSTEMS:   Constitutional: Denies fevers, chills or abnormal night sweats Eyes: Denies blurriness of vision, double vision or watery eyes Ears, nose, mouth, throat, and face: Denies mucositis or sore throat Respiratory: Denies cough, dyspnea or wheezes Cardiovascular: Denies palpitation, chest discomfort or lower extremity swelling Gastrointestinal:  Denies nausea, heartburn or change in bowel habits Skin: Denies abnormal skin rashes Lymphatics: Denies new lymphadenopathy or easy bruising Neurological:Denies numbness, tingling or new weaknesses Behavioral/Psych: Mood is stable, no new changes  All other systems were reviewed with the patient and are  negative.  PHYSICAL EXAMINATION: ECOG PERFORMANCE STATUS: {CHL ONC ECOG AU:[id number]}  Vitals:   04/26/19 1547  BP: (!) 166/64  Pulse: 100  Resp: 18  Temp: 97.9 F (36.6 C)  SpO2: 94%   Filed Weights   04/26/19 1547  Weight: 201 lb (91.2 kg)    GENERAL:alert, no distress and comfortable SKIN: skin color, texture, turgor are normal, no rashes or significant lesions EYES: normal, conjunctiva are pink and non-injected, sclera clear OROPHARYNX:no exudate, no erythema and lips, buccal mucosa, and tongue normal  NECK: supple, thyroid normal size, non-tender, without nodularity LYMPH:  no palpable lymphadenopathy in the cervical, axillary or inguinal LUNGS: clear to auscultation and percussion with normal breathing effort HEART: regular rate & rhythm and no murmurs and no lower extremity edema ABDOMEN:abdomen soft, non-tender and normal bowel sounds Musculoskeletal:no cyanosis of digits and no clubbing  PSYCH: alert & oriented x 3 with fluent speech NEURO: no focal motor/sensory deficits  LABORATORY DATA:  I have reviewed the data as listed Lab Results  Component Value Date   WBC 8.2 04/22/2019   HGB 11.6 (L) 04/22/2019   HCT 36.8 (L) 04/22/2019   MCV 98.1 04/22/2019   PLT 159 04/22/2019     Chemistry      Component Value Date/Time   NA 144 04/22/2019 0600   K 4.1 04/22/2019 0600   CL 107 04/22/2019 0600   CO2 27 04/22/2019 0600   BUN 19 04/22/2019 0600   CREATININE 0.89 04/22/2019 0600      Component Value Date/Time   CALCIUM 8.8 (L) 04/22/2019 0600   ALKPHOS 67 04/18/2019 0834   AST 14 (L) 04/18/2019 0834   ALT 13 04/18/2019 0834   BILITOT 0.6 04/18/2019 0834       RADIOGRAPHIC STUDIES: I have personally reviewed the radiological images as listed and agreed with the findings in the report. Ct Abdomen Pelvis Wo Contrast  Result Date: 04/18/2019 CLINICAL DATA:  Non pulsatile abdominal mass. Hydronephrosis. Bilateral lower extremity DVTs. EXAM: CT ABDOMEN  AND PELVIS WITHOUT CONTRAST TECHNIQUE: Multidetector CT imaging of the abdomen and pelvis was performed following the standard protocol without IV contrast. COMPARISON:  Chest CT earlier this day. FINDINGS: Lower chest: Patchy ground-glass airspace disease as seen on chest CT earlier this day. Small left pleural effusions/thickening. Hepatobiliary: No evidence of focal hepatic lesion, limited evaluation due to lack of IV contrast. Question of nodular hepatic contours. Gallbladder physiologically distended, no calcified stone. No biliary dilatation, however distal common bile duct not well-defined. Pancreas: Large soft tissue mass in the central abdomen involves the body, possibly neck of the pancreas. No distal ductal dilatation. No definite frank peripancreatic inflammation. Spleen: Normal in size without focal abnormality. Spleen measures 10.4 cm. Adrenals/Urinary Tract: Low-density 18 mm left adrenal nodule is most consistent with adenoma. Inferior aspect of  the adrenal gland is poorly defined may be contiguous with the large central abdominal mass. The right adrenal gland is normal. Mild to moderate hydronephrosis. Right perinephric edema and thickening of the perirenal fascia. Possible thickening of the right proximal ureter and renal pelvis, for example series 2, image 35. Distal ureter is ill-defined. Fullness of the left renal collecting system without definitive hydronephrosis. There is left perinephric edema and thickening of the perirenal fascia. Ill-defined density in the region of the left ureter, not well assessed in the absence of contrast. Urinary bladder is minimally distended but thick walled. There is perivesicular edema. Stomach/Bowel: Nondistended stomach. Central abdominal mass abutting the fourth portion of the duodenum and proximal jejunum. Evidence of gastric outlet obstruction. Mass travels along the root of the mesentery. No bowel obstruction. Administered enteric contrast opacifies normal  ileal small bowel loops and colon. Normal appendix. Vascular/Lymphatic: Bulky right external iliac adenopathy, nodal conglomerate measuring 5.4 x 3.8 cm, series 2, image 75. There are enlarged right inguinal nodes measuring up to 2.3 cm short axis. Prominent enlarged left inguinal lymph nodes measuring up to 2.4 cm. Right pelvic sidewall adenopathy is well-defined in the absence of IV contrast. Central abdominal mass at the root of the mesentery may represent a large nodal conglomerate, exact measurements are difficult due to the ill-defined shape, however this measures approximately 12 x 7.2 x 11.2 cm. Additional enlarged adjacent nodes in the mesentery. Presumed bilateral retroperitoneal adenopathy, left periaortic ill-defined node series 2, image 30. Enlarged lymph nodes at the root of the celiac axis, porta hepatis, and small nodules in the right perinephric space superiorly. Advanced aortic atherosclerosis. No evidence of aortic aneurysm. Reproductive: Prostate is unremarkable. Other: Fat in both inguinal canals. Large central mesenteric mass as described above has surrounding edema and soft tissue stranding. Bilateral retroperitoneal stranding. No definite ascites. No free air. Patchy edema in the right lower anterior abdominal wall is nonspecific. 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 No focal bone lesion. Intervertebral disc calcifications noted in the lower thoracic spine. IMPRESSION: 1. Dominant soft tissue mass in the proximal mesentery measuring up to 12 cm. There is also a multifocal adenopathy throughout the abdomen and pelvis. In conjunction with thoracic adenopathy, findings highly suspicious for lymphoproliferative disorder such as lymphoma. Metastatic disease is also considered. Enlarged inguinal nodes (right greater than left) may be an easily accessible option for tissue sampling. 2. Right hydronephrosis and fullness of the left  renal collecting system, this may be secondary to mass effect from central abdominal mass or adenopathy. There is bilateral proximal ureteral thickening. 3. Possible nodular hepatic contours, raising concern for cirrhosis. Recommend correlation for cirrhotic risk factors. 4. Advanced aortic atherosclerosis. Aortic Atherosclerosis (ICD10-I70.0). Electronically Signed   By: Keith Rake M.D.   On: 04/18/2019 23:23   Dg Chest 2 View  Result Date: 04/18/2019 CLINICAL DATA:  DVT.  Acute respiratory failure with hypoxia. EXAM: CHEST - 2 VIEW COMPARISON:  04/17/2019; bilateral lower extremity venous Doppler ultrasound-earlier same day FINDINGS: Grossly unchanged enlarged cardiac silhouette and mediastinal contours with nodular prominence of the bilateral pulmonary hila. Atherosclerotic plaque within the thoracic aorta. There is persistent thickening the right paratracheal stripe presumably secondary prominent vasculature. Ill-defined heterogeneous potential airspace opacities within the right mid and left lower lung are unchanged. Mild cephalization of flow without frank evidence of edema. No pleural effusion or pneumothorax. No evidence of edema. IMPRESSION: 1. Grossly unchanged ill-defined heterogeneous opacities within the right upper and  left lower lungs, potentially atelectasis or asymmetric pulmonary edema though developing infection could have a similar appearance. Additionally, in the setting of known lower extremity DVT, conceivably pulmonary infarction could have a similar appearance. 2. Redemonstrated prominence of the bilateral pulmonary hila, potentially secondary to prominent pulmonary vasculature though given presence of potential left inguinal adenopathy (demonstrated on preceding lower extremity Doppler ultrasound), conceivably hilar adenopathy could have a similar appearance. Further evaluation with chest CT (preferably contrast-enhanced) could be performed as clinically indicated. 3. Pulmonary  venous congestion without frank evidence of edema. Electronically Signed   By: Sandi Mariscal M.D.   On: 04/18/2019 11:24   Ct Chest Wo Contrast  Result Date: 04/18/2019 CLINICAL DATA:  Shortness of breath upon arrival to ED yesterday. Acute hypoxic respiratory failure. COVID-19 test is negative. EXAM: CT CHEST WITHOUT CONTRAST TECHNIQUE: Multidetector CT imaging of the chest was performed following the standard protocol without IV contrast. COMPARISON:  Two-view chest x-ray 04/18/2019 FINDINGS: Cardiovascular: Heart size is normal. Coronary artery calcifications are present. Atherosclerotic changes are present in the aortic arch and at the great vessel origins. The left vertebral artery originates from the arch. There is no aneurysm. Pulmonary artery size is within normal limits. Mediastinum/Nodes: Subcarinal in bilateral hilar adenopathy is present, left greater than right. Enlarged axillary nodes are present bilaterally. Left axillary node measures 1.7 cm in short axis. Right axillary node measures 1.6 cm in short axis. Subcarinal node measures 2.5 cm in short axis. Lungs/Pleura: Patchy bilateral airspace opacities are present bilaterally this is most prominent in the right upper lobe and superior segment of the right lower lobe. There is some prominence in the left lower lobe. Left pleural effusion is present. The airspace disease is more central than peripheral. There is no pneumothorax. No significant right-sided pleural effusion is present. Upper Abdomen: Edematous changes are present at the kidneys bilaterally. Cortical lymph nodes are suspected. Musculoskeletal: Vertebral body heights alignment are maintained. No focal lytic or blastic lesions are present. The ribs are unremarkable. IMPRESSION: 1. Patchy bilateral airspace disease. Findings are most worrisome for infection. 2. Mediastinal and bilateral hilar adenopathy, left greater than right. While this may be related to the acute  infectious/inflammatory process, neoplasm is not excluded. There are no calcifications to suggest granulomatous disease. Sarcoid is not excluded. 3. Bilateral axillary adenopathy is also concerning for malignancy. 4.  Aortic Atherosclerosis (ICD10-I70.0). Electronically Signed   By: San Morelle M.D.   On: 04/18/2019 15:47   US Renal  Result Date: 04/18/2019 CLINICAL DATA:  Acute renal insufficiency. EXAM: RENAL / URINARY TRACT ULTRASOUND COMPLETE COMPARISON:  None. FINDINGS: Right Kidney: Renal measurements: 12.6 x 6.3 x 8.4 cm = volume: 347 mL. Mild left hydronephrosis identified. Left Kidney: Renal measurements: 13 x 6.9 x 7.2 cm = volume: 336 mL. Echogenicity within normal limits. No mass or hydronephrosis visualized. Bladder: The bladder is decompressed. Other: There is a complex mass in the right side of the pelvis/right lower quadrant with some internal blood flow. IMPRESSION: 1. There is a complex mass in the right lower quadrant/right pelvis, with internal blood flow. A CT scan may better evaluate. Malignancy not excluded. 2. Mild hydronephrosis associated with the right kidney, age indeterminate. Recommend clinical correlation. Electronically Signed   By: Dorise Bullion III M.D   On: 04/18/2019 12:50   Nm Pulmonary Vent And Perf (v/q Scan)  Result Date: 04/17/2019 CLINICAL DATA:  Shortness of breath since 3 o'clock this morning. Evaluate for pulmonary embolism. EXAM: NUCLEAR MEDICINE VENTILATION - PERFUSION  LUNG SCAN TECHNIQUE: Ventilation images were obtained in multiple projections using inhaled aerosol Tc-15m DTPA. Perfusion images were obtained in multiple projections after intravenous injection of Tc-24m MAA. RADIOPHARMACEUTICALS:  41 mCi of Tc-38m DTPA aerosol inhalation and 1.5 mCi Tc55m MAA IV COMPARISON:  Chest radiograph-earlier same day FINDINGS: Review of chest radiograph performed earlier same day demonstrates ill-defined heterogeneous opacities within the right upper and  left lower lung. Ventilation: Ventilatory images demonstrate clumping of inhaled radiotracer about the bilateral pulmonary hila. There is an ill-defined area of decreased ventilation involving the peripheral aspect of the right upper lung regional to the expected location of the potential developing airspace opacity demonstrated on preceding chest radiograph. No additional areas of non ventilation are identified. Ingested radiotracer is seen within the esophagus and stomach. Perfusion: There is a matched area of decreased perfusion involving the peripheral aspect of the right upper lung correlating with the potential developing airspace opacity seen on preceding chest radiograph. Otherwise, there is relative homogeneous perfusion of the bilateral pulmonary parenchyma without definitive mismatched perfusion defect. IMPRESSION: Very low probability of pulmonary embolism - triple matched defect involving the peripheral aspect of the right upper lung corresponding with the potential developing airspace opacity questioned on preceding chest radiograph. Electronically Signed   By: Sandi Mariscal M.D.   On: 04/17/2019 11:16   US Venous Img Lower Bilateral  Result Date: 04/18/2019 CLINICAL DATA:  Bilateral lower extremity pain and edema for the past week. Shortness of breath. Patient is on heparin. Evaluate for DVT. EXAM: BILATERAL LOWER EXTREMITY VENOUS DOPPLER ULTRASOUND TECHNIQUE: Gray-scale sonography with graded compression, as well as color Doppler and duplex ultrasound were performed to evaluate the lower extremity deep venous systems from the level of the common femoral vein and including the common femoral, femoral, profunda femoral, popliteal and calf veins including the posterior tibial, peroneal and gastrocnemius veins when visible. The superficial great saphenous vein was also interrogated. Spectral Doppler was utilized to evaluate flow at rest and with distal augmentation maneuvers in the common femoral,  femoral and popliteal veins. COMPARISON:  Nuclear medicine V/Q scan-04/17/2019; chest radiograph-earlier same day FINDINGS: RIGHT LOWER EXTREMITY There is hypoechoic expansile near occlusive thrombus within the right common femoral vein (image 3), extending to involve the saphenofemoral junction (image 9). There is hypoechoic near occlusive thrombus involving the proximal aspect of the right deep femoral vein (image 12). There is hypoechoic occlusive thrombus involving the proximal (image 14) mid (image 17) and distal (image 22) aspects of the right femoral vein. There is hypoechoic nonocclusive thrombus within the right popliteal vein (image 27 extending to involve the right posterior tibial (image 31) and peroneal (image 33) veins. The anterior tibial vein appears patent where imaged. Other Findings:  None. _________________________________________________________ LEFT LOWER EXTREMITY Common Femoral Vein: No evidence of thrombus. Normal compressibility, respiratory phasicity and response to augmentation. Saphenofemoral Junction: No evidence of thrombus. Normal compressibility and flow on color Doppler imaging. Profunda Femoral Vein: No evidence of thrombus. Normal compressibility and flow on color Doppler imaging. Femoral Vein: No evidence of thrombus. Normal compressibility, respiratory phasicity and response to augmentation. Popliteal Vein: No evidence of thrombus. Normal compressibility, respiratory phasicity and response to augmentation. Calf Veins: There is hypoechoic occlusive expansile thrombus within the left posterior tibial (image 72) as well as the left peroneal (image 75) veins. The anterior tibial vein appears patent where imaged. Superficial Great Saphenous Vein: No evidence of thrombus. Normal compressibility. Other Findings: There is an approximately 2.8 x 3.2 x 2.7 cm hypoechoic nodule  within the left groin which appears to demonstrate internal blood flow and may represent a pathologically  enlarged left inguinal lymph node. IMPRESSION: 1. The examination is positive for rather extensive predominantly occlusive right lower extremity DVT extending from the right common femoral vein through the imaged tibial veins. 2. The examination is positive for occlusive DVT involving the left posterior tibial and peroneal veins. 3. Indeterminate approximately 3.2 cm hypoechoic nodule within left groin, indeterminate though could represent a pathologically enlarged lymph node. Further evaluation with contrast-enhanced CT scan could be performed as indicated. Electronically Signed   By: Sandi Mariscal M.D.   On: 04/18/2019 11:19   Dg Chest Port 1 View  Result Date: 04/17/2019 CLINICAL DATA:  Shortness of breath beginning yesterday. Mild productive cough. No known fevers. EXAM: PORTABLE CHEST 1 VIEW COMPARISON:  None. FINDINGS: Borderline enlarged cardiac silhouette and mediastinal contours, potentially accentuated due to slightly reduced lung volumes and AP projection. Ill defined heterogeneous opacities are seen within the right upper and left lower lungs. Mild pulmonary venous congestion without frank evidence of edema. No pleural effusion or pneumothorax. No acute osseous abnormalities. IMPRESSION: 1. Potential developing airspace opacities within the right upper and left lower lung, potentially atelectasis though could be seen in the setting of multifocal infection. Clinical correlation is advised. Further evaluation with a PA and lateral chest radiograph may be obtained as clinically indicated. 2. Pulmonary venous congestion without frank evidence of edema. Electronically Signed   By: Sandi Mariscal M.D.   On: 04/17/2019 04:22   Dg C-arm 1-60 Min-no Report  Result Date: 04/21/2019 Fluoroscopy was utilized by the requesting physician.  No radiographic interpretation.    ASSESSMENT & PLAN:  No problem-specific Assessment & Plan notes found for this encounter.  Orders Placed This Encounter  Procedures   . NM PET Image Initial (PI) Skull Base To Thigh    Standing Status:   Future    Standing Expiration Date:   04/25/2020    Order Specific Question:   ** REASON FOR EXAM (FREE TEXT)    Answer:   Diffuse large B-cell lymphoma    Order Specific Question:   If indicated for the ordered procedure, I authorize the administration of a radiopharmaceutical per Radiology protocol    Answer:   Yes    Order Specific Question:   Preferred imaging location?    Answer:   [facility]    Order Specific Question:   Radiology Contrast Protocol - do NOT remove file path    Answer:   [email or web address]  . Beta 2 microglobuline, serum    Standing Status:   Future    Number of Occurrences:   1    Standing Expiration Date:   04/25/2020  . HCV RNA quant rflx ultra or genotyp    Standing Status:   Future    Number of Occurrences:   1    Standing Expiration Date:   04/25/2020  . Hepatitis B surface antibody    Standing Status:   Future    Number of Occurrences:   1    Standing Expiration Date:   04/25/2020  . Hepatitis B surface antigen    Standing Status:   Future    Number of Occurrences:   1    Standing Expiration Date:   04/25/2020  . Uric acid    Standing Status:   Future    Number of Occurrences:   1    Standing Expiration Date:   04/25/2020  . Sedimentation rate  Standing Status:   Future    Number of Occurrences:   1    Standing Expiration Date:   04/25/2020  . Hepatitis B core antibody, total    All questions were answered. The patient knows to call the clinic with any problems, questions or concerns. I spent {CHL ONC TIME VISIT - DKEUV:[id number]} counseling the patient face to face. The total time spent in the appointment was {CHL ONC TIME VISIT - EATVV:[id number]} and more than 50% was on counseling.     Donia Ast, LPN 80/09/4713 8:06 PM

## 2019-04-26 NOTE — Assessment & Plan Note (Addendum)
1.  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  Right hydronephrosis.  Cirrhotic liver. -CT chest shows bilateral hilar and mediastinal adenopathy, bilateral axillary adenopathy.  No B symptoms. -Right inguinal lymph node biopsy on 04/21/2019 with diffuse large B-cell lymphoma (40%) showing in the setting of follicular lymphoma (41%).  I discussed the path report with the patient and his wife in detail. -I have recommended a whole-body PET CT scan. -His echocardiogram during recent admission shows EF of 60 to 65%. -We will check his LDH, beta-2 microglobulin, uric acid, hepatitis B and C serology. -We talked about initiating him on R-CHOP chemotherapy every 3 weekly for 6 cycles.  Treatment intent for diffuse large B-cell lymphoma is curative.  However treatment intent for a follicular lymphoma is control. -We will consider maintenance rituximab after completion of R-CHOP. -We will also make referral for port placement.  2.  Bilateral leg DVT: -Ultrasound Doppler on 04/18/2019 suggestive of extensive right leg DVT and left tibial and peroneal DVT. -VQ scan was low probability for PE.  However because of shortness of breath, it was presumed that he had pulmonary embolism. -He is tolerating Eliquis very well.  However he complains of right leg pain predominantly at nighttime. -He is taking tramadol without help.  I will change it to hydrocodone 5 mg to be taken 2-3 times a day as needed.  3.  Right hydronephrosis: -Cystoscopy and bilateral JJ stent placement on 04/21/2019. -He continues to have mild hematuria.

## 2019-04-27 ENCOUNTER — Ambulatory Visit: Payer: BLUE CROSS/BLUE SHIELD | Admitting: Family Medicine

## 2019-04-27 ENCOUNTER — Other Ambulatory Visit: Payer: Self-pay

## 2019-04-27 ENCOUNTER — Encounter (HOSPITAL_COMMUNITY): Payer: Self-pay

## 2019-04-27 ENCOUNTER — Encounter: Payer: Self-pay | Admitting: Family Medicine

## 2019-04-27 ENCOUNTER — Ambulatory Visit (HOSPITAL_COMMUNITY): Payer: BLUE CROSS/BLUE SHIELD | Admitting: Hematology

## 2019-04-27 VITALS — BP 169/84 | HR 71 | Temp 97.5°F | Ht 66.0 in | Wt 203.0 lb

## 2019-04-27 DIAGNOSIS — I82403 Acute embolism and thrombosis of unspecified deep veins of lower extremity, bilateral: Secondary | ICD-10-CM | POA: Diagnosis not present

## 2019-04-27 DIAGNOSIS — I10 Essential (primary) hypertension: Secondary | ICD-10-CM | POA: Diagnosis not present

## 2019-04-27 DIAGNOSIS — C8338 Diffuse large B-cell lymphoma, lymph nodes of multiple sites: Secondary | ICD-10-CM

## 2019-04-27 DIAGNOSIS — N1330 Unspecified hydronephrosis: Secondary | ICD-10-CM | POA: Diagnosis not present

## 2019-04-27 DIAGNOSIS — Z95828 Presence of other vascular implants and grafts: Secondary | ICD-10-CM

## 2019-04-27 HISTORY — DX: Presence of other vascular implants and grafts: Z95.828

## 2019-04-27 LAB — HCV RNA QUANT RFLX ULTRA OR GENOTYP
HCV RNA Qnt(log copy/mL): UNDETERMINED log10 IU/mL
HepC Qn: NOT DETECTED IU/mL

## 2019-04-27 MED ORDER — PROCHLORPERAZINE MALEATE 10 MG PO TABS: 10.0000 mg | ORAL_TABLET | Freq: Four times a day (QID) | ORAL | 6 refills | Status: DC | PRN

## 2019-04-27 MED ORDER — LIDOCAINE-PRILOCAINE 2.5-2.5 % EX CREA: TOPICAL_CREAM | CUTANEOUS | 3 refills | Status: DC

## 2019-04-27 MED ORDER — AMLODIPINE BESYLATE 10 MG PO TABS: 10.0000 mg | ORAL_TABLET | Freq: Every day | ORAL | 1 refills | Status: DC

## 2019-04-27 MED ORDER — ALLOPURINOL 300 MG PO TABS: 300.0000 mg | ORAL_TABLET | Freq: Every day | ORAL | 3 refills | Status: DC

## 2019-04-27 MED ORDER — PREDNISONE 20 MG PO TABS: 100.0000 mg | ORAL_TABLET | Freq: Every day | ORAL | 0 refills | Status: DC

## 2019-04-27 MED ORDER — LOSARTAN POTASSIUM 100 MG PO TABS: 100.0000 mg | ORAL_TABLET | Freq: Every day | ORAL | 1 refills | Status: DC

## 2019-04-27 MED ORDER — METOPROLOL SUCCINATE ER 50 MG PO TB24: 50.0000 mg | ORAL_TABLET | Freq: Every day | ORAL | 1 refills | Status: DC

## 2019-04-27 NOTE — Patient Instructions (Addendum)
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  You will be treated every 3 weeks for six cycles with a combination of chemotherapy and immunotherapy medications (see below).  The intent of treatment of the B-cell lymphoma is cure.  The intent of treatment of the underlying follicular lymphoma is control.  After you receive six cycles of this regimen the doctor will consider keeping you on rituximab (Rituxan) for maintenance/control of the follicular lymphoma.  You will see the doctor regularly throughout treatment.  We will obtain blood work from you prior to every treatment and monitor your results to make sure it is safe to give your treatment. The doctor monitors your response to treatment by the way you are feeling, your blood work, and by obtaining scans periodically.  There will be wait times while you are here for treatment.  It will take about 30 minutes to 1 hour for your lab work to result.  Then there will be wait times while pharmacy mixes your medications.     R-CHOP is the acronym for the combination of chemotherapy drugs and other drugs you will receive for your treatment.  The drugs this regimen includes are:  Rituxan (rituximab); Cytoxan (cyclophosphamide); Oncovin (vincristine); Adriamycin (doxorubicin HCl); and prednisone (not a chemo drug).  Medications you will receive in the clinic prior to your chemotherapy medications:  Aloxi:  ALOXI is used in adults to help prevent nausea and vomiting that happens with certain chemotherapy drugs.  Aloxi is a long acting medication, and will remain in your system for about two days.   Emend:  This is an anti-nausea medication that is used with Aloxi to help prevent nausea and vomiting caused by chemotherapy.  Dexamethasone:  This is a steroid given prior to chemotherapy to help prevent allergic reactions; it may also help prevent and control nausea and  diarrhea.   Tylenol:  Given to prevent infusion reactions to rituximab.  Benadryl:  Antihistamine given to prevent allergic/infusion reactions to rituximab.     Doxorubicin (Generic Name) Other Names: Adriamycin, hydroxyl daunorubicin  About This Drug  Doxorubicin is a drug used to treat cancer. This drug is given in the vein (IV).  This is an IV push that will take about 5-10 minutes.  Possible Side Effects (More Common)  . Bone marrow depression. This is a decrease in the number of white blood cells, red blood cells, and platelets. This may raise your risk of infection, make you tired and weak (fatigue), and raise your risk of bleeding.  . Hair loss: Hair loss is often complete scalp hair loss and can involve loss of eyebrows, eyelashes, and pubic hair. You may notice this a few days or weeks after treatment has started. Most often hair loss is temporary; your hair should grow back when treatment is done.  . Nausea and throwing up (vomiting). These symptoms may happen within a few hours after your treatment and may last up to 24 hours. Medicines are available to stop or lessen these side effects.  . Soreness of the mouth and throat. You may have red areas, white patches, or sores that hurt.  . Change in the color of your urine to pink or red. This color change will go away in one to two days.  . Effects on the heart: This drug can weaken the heart and lower heart function. Your heart function will be checked as needed. You may have trouble  catching your breath, mainly during activities.  You may also have trouble breathing while lying down, and have swelling in your ankles.  . Sensitivity to light (photosensitivity). Photosensitivity means that you may become more sensitive to the effects of the sun, sun lamps, and tanning beds. Your eyes may water more, mostly in bright light.  . Metallic taste in the mouth: This may change the taste of food and drinks  . Decreased appetite  (decreased hunger)  . Darkening of the skin or nails  . Weakness that interferes with your daily activities   Possible Side Effects (Less Common)  . Skin and tissue irritation may involve redness, pain, warmth, or swelling at the IV site. This happens if the drug leaks out of the vein and into nearby tissue.  . Changes in your liver function. Your doctor will check your liver function as needed.  . This drug may cause an increased risk of developing a second cancer  Allergic Reaction  Serious allergic reactions, including anaphylaxis are rare. While you are getting this drug in your vein (IV), tell your nurse right away if you have any of these symptoms of an allergic reaction:  . Trouble catching your breath  . Feeling like your tongue or throat are swelling  . Feeling your heart beat quickly or in a not normal way (palpitations)  . Feeling dizzy or lightheaded  . Flushing, itching, rash, and/or hives  Treating Side Effects  . Drink 6-8 cups of fluids every day unless your doctor has told you to limit your fluid intake due to some other health problem. A cup is 8 ounces of fluid. If you vomit or have diarrhea, you should drink more fluids so that you do not become dehydrated (lack water in the body due to losing too much fluid).  . Ask your doctor or nurse about medicine that is available to help stop or lessen nausea, throwing up, and/or loose bowel movements  . Wear dark sunglasses and use sunscreen with SPF 30 or higher when you are outdoors even for a short time. Cover up when you are out in the sun. Wear wide-brimmed hats, long-sleeved shirts, and pants. Keep your neck, chest, and back covered.  . Mouth care is very important. Your mouth care should consist of routine, gentle cleaning of your teeth or dentures and rinsing your mouth with a mixture of 1/2 teaspoon of salt in 8 ounces of water or  teaspoon of baking soda in 8 ounces of water. This should be done at least  after each meal and at bedtime.  . If you have mouth sores, avoid mouthwash that has alcohol. Avoid alcohol and smoking because they can bother your mouth and throat.  . Talk with your nurse about getting a wig before you lose your hair. Also, call the [place] at 800-ACS-2345 to find out information about the "Look Good, Feel Better" program close to where you live. It is a free program where women getting chemotherapy can learn about wigs, turbans and scarves as well as makeup techniques and skin and nail care.  . While you are getting this drug, please tell your nurse right away if you have any pain, redness, or swelling at the site of the IV infusion.  Food and Drug Interactions  There are no known interactions of doxorubicin with food. This drug may interact with other medicines. Tell your doctor and pharmacist about all the medicines and dietary supplements (vitamins, minerals, herbs and others) that you  are taking at this time. The safety and use of dietary supplements and alternative diets are often not known. Using these might affect your cancer or interfere with your treatment. Until more is known, you should not use dietary supplements or alternative diets without your cancer doctor's help.  When to Call the Doctor  Call your doctor or nurse right away if you have any of these symptoms:  . Fever of 100.5 F (38 C) or above  . Chills  . Easy bruising or bleeding  . Wheezing or trouble breathing  . Rash or itching  . Feeling dizzy or lightheaded  . Feeling that your heart is beating in a fast or not normal way (palpitations)  . Loose bowel movements (diarrhea) more than 4 times a day or diarrhea with weakness or feeling lightheaded  . Nausea that stops you from eating or drinking  . Throwing up more than 3 times a day  . Signs of liver problems: dark urine, pale bowel movements, bad stomach pain, feeling very tired and weak, unusual itching, or yellowing of  the eyes or skin,  . During the IV infusion, if you have pain, redness, or swelling at the site of the IV infusion, please tell your nurse right away  Call your doctor or nurse as soon as possible if any of these symptoms happen:  . Decreased urine  . Pain in your mouth or throat that makes it hard to eat or drink  . Nausea and throwing up that is not relieved by prescribed medicines  . Rash that is not relieved by prescribed medicines  . Swelling of legs, ankles, or feet  . Weight gain of 5 pounds in one week (fluid retention)  . Lasting loss of appetite or rapid weight loss of five pounds in a week  . Fatigue that interferes with your daily activities  . Extreme weakness that interferes with normal activities  Sexual Problems and Reproduction Concerns  . Infertility warning: Sexual problems and reproduction concerns may happen. In both men and women, this drug may affect your ability to have children. This cannot be determined before your treatment. Talk with your doctor or nurse if you plan to have children. Ask for information on sperm or egg banking.  . In men, this drug may interfere with your ability to make sperm, but it should not change your ability to have sexual relations.  . In women, menstrual bleeding may become irregular or stop while you are getting this drug. Do not assume that you cannot become pregnant if you do not have a menstrual period.  . Women may go through signs of menopause (change of life) like vaginal dryness or itching. Vaginal lubricants can be used to lessen vaginal dryness, itching, and pain during sexual relations.  . Genetic counseling is available for you to talk about the effects of this drug therapy on future pregnancies. Also, a genetic counselor can look at the possible risk of problems in the unborn baby due to this medicine if an exposure happens during pregnancy.  . Pregnancy warning: This drug may have harmful effects on the unborn  baby, so effective methods of birth control should be used by you and your partner during your cancer treatment and for at least 6 months after treatment  . Breast feeding warning: Women should not breast feed during treatment because this drug could enter the breast milk and badly harm a breast feeding baby.   Vincristine sulfate (Generic Name) Other Names: Oncovin, Vincasar,  VCR  About This Drug  Vincristine is a drug used to treat cancer. It is given in the vein (IV).  This drug will take 10 minutes to infuse.  Possible Side Effects (More Common)  . Constipation (not able to move bowels)  . Hair loss. You may notice hair getting thin. Some patients lose their hair. Your hair often grows back when treatment is done. Most often hair loss is temporary; your hair should grow back when treatment is done.  . Effects on the nerves are called peripheral neuropathy. You may feel numbness, tingling, or pain in your hands and feet. It may be hard for you to button your clothes, open jars, or walk as usual. The effect on the nerves may get worse with more doses of the drug. These effects get better in some people after the drug is stopped but it does not get better in all people.   Possible Side Effects (Less Common)  . Soreness of the mouth and throat. You may have red areas, white patches, or sores that hurt.  . Swelling of your legs, ankles, and/or feet  . Skin and tissue irritation may involve redness, pain, warmth, or swelling at the IV site. This happens if the drug leaks out of the vein and into nearby tissue.  . Blood pressure changes. Some patients have low blood pressure and some patients have high blood pressure.  . Jaw pain  . Hoarseness  . Blurred or double vision or other changes in eyesight may happen but are a rare side effect.  . Feeling dizzy or lightheaded  . Drooping eyelids are a rare side effect.  . Depression  . Rash  . Bone marrow depression. This is a  decrease in the number of white blood cells, red blood cells, and platelets. This may raise your risk of infection, make you tired and weak (fatigue), and raise your risk of bleeding.  . Chest pain or symptoms of a heart attack. Most heart attacks involve pain in the center of the chest that lasts more than a few minutes. The pain may go away and come back or it can be constant. It can feel like pressure, squeezing, fullness, or pain. Sometimes pain is felt in one or both arms, the back, neck, jaw, or stomach. If any of these symptoms last 2 minutes, call 911.  . Trouble sleeping  . Lack of appetite; weight loss  Allergic Reactions  Serious allergic reactions including anaphylaxis are rare. While you are getting this drug in your vein (IV), tell your nurse right away if you have any of these symptoms of an allergic reaction:  . Trouble catching your breath  . Feeling like your tongue or throat are swelling  . Feeling your heart beat quickly or in a not normal way (palpitations)  . Feeling dizzy or lightheaded  . Flushing, itching, rash, and/or hives  Treating Side Effects  . While you are getting this drug in your IV, please tell your nurse right away if you have any pain, redness, or swelling at the site of the IV infusion.  . Ask your doctor or nurse how to prevent or lessen constipation. Constipation can become a serious side effect. If you are constipated, check with your doctor or nurse before you use enemas, laxatives, or suppositories.  . Drink 6-8 cups of fluids each day unless your doctor has told you to limit your fluid intake due to some other health problem. A cup is 8 ounces of fluid.  If you throw up or have loose bowel movements, you should drink more fluids so that you do not become dehydrated (lack water in the body from losing too much fluid).  . Mouth care is very important. Your mouth care should consist of gently cleaning your teeth or dentures and rinsing your  mouth with a mixture of  teaspoon salt in 8 ounces of water or  teaspoon sodium bicarbonate (baking soda) in 8 ounces of water. This should be done at least after each meal and at bedtime.  . If you have mouth sores, avoid mouthwash that has alcohol. Also avoid alcohol and smoking because they can bother your mouth and throat.  . If you get a rash do not put anything on it unless your doctor or nurse says you may. Keep the area around the rash clean and dry. Ask your doctor for medicine if your rash bothers you.  . If you have numbness and tingling in your hands and feet, be careful when cooking, walking, and handling sharp objects and hot liquids.  . Talk with your nurse about getting a wig before you lose your hair. Also, call the [place] at 800-ACS-2345 to find out information about the "Look Good, Feel Better" program close to where you live. It is a free program where women getting chemotherapy can learn about wigs, turbans and scarves as well as makeup techniques and skin and nail care.  Food and Drug Interactions  . There are known interactions of Vincristine with grapefruit. Do not eat grapefruit or drink grapefruit juice while taking this drug.  . Check with your doctor or pharmacist about all other prescription medicines and dietary supplements you are taking before starting this medicine as there are a lot of known drug interactions with Vincristine. Also, check with your doctor or pharmacist before starting any new prescription, over-the-counter medicines, or dietary supplement to make sure that there are no interactions.  When to Call the Doctor  Call your doctor or nurse right away if you have any of these symptoms:  . Redness, pain, warmth, or swelling at the IV site while you are getting this drug  . No bowel movement in 3 days or when you feel uncomfortable.  . Abdominal pain  . Fever of 100.5 F (38 C) or higher  . Chills  . Feeling short of  breath  . Easy bleeding or bruising  . Jaw pain  . Hoarseness  . Drooping eyelids  . Blurred vision or other changes in eyesight  . Feeling confused, restless, or irritable  . Seizures (Common symptoms of a seizure can include confusion, blacking out, passing out, loss of hearing or vision, blurred vision, unusual smells or tastes (such as burning rubber), trouble talking, tremors or shaking in parts or all of the body, repeated body movements, tense muscles that do not relax, and loss of control of urine and bowels. There are other less common symptoms of seizures. If you or your family member suspects you are having a seizure, call 911 right away).  . Hallucinations (seeing, hearing, or feeling things that are not really there)  . Feeling  . Rash  . Chest pain or symptoms of a heart attack. Most heart attacks involve pain in the center of the chest that lasts more than a few minutes. The pain may go away and come back. It can feel like pressure, squeezing, fullness, or pain. Sometimes pain is felt in one or both arms, the back, neck, jaw,  or stomach. If any of these symptoms last 2 minutes, call 911.  Call your doctor or nurse as soon as possible if you have any of these symptoms:  . Pain in fingers, toes, joints, or testicles  . Numbness, tingling, or decreased feeling or weakness in fingers, toes, arms, or legs  . Trouble walking or changes in the way you walk  . Swelling of your legs, ankles, and/or feet  . Headache  . Pain in your mouth or throat that makes it hard to eat or drink  . Loss of appetite or weight loss  . Hearing changes  . Depression  . Trouble sleeping  Reproduction Concerns  . Women may go through signs of menopause (change of life) like vaginal dryness or itching. Vaginal lubricants can be used to lessen vaginal dryness, itching, and pain during sexual relations.  . Pregnancy warning: This drug may have harmful effects on the unborn child, so  effective methods of birth control should be used during your cancer treatment.  . Breast feeding warning: It is not known if this drug passes into breast milk. For this reason, women should talk to their doctor about the risks and benefits of breast feeding during treatment with this drug because this drug may enter the breast milk and badly harm a breast feeding baby.   Cyclophosphamide (Generic Name) Other Names: Cytoxan, Neosar  About This Drug  Cyclophosphamide is a drug used to treat cancer. It is given in the vein (IV) or by mouth. This drug takes 30 minutes to infuse.  Possible Side Effects (More Common)  . Nausea and throwing up (vomiting). These symptoms may happen within a few hours after your treatment and may last up to 72 hours. Medicines are available to stop or lessen these side effects.  . Bone marrow depression. This is a decrease in the number of white blood cells, red blood cells, and platelets. This may raise your risk of infection, make you tired and weak (fatigue), and raise your risk of bleeding.  . Hair loss: You may notice hair getting thin. Some patients lose their hair. Hair loss is often complete scalp hair loss and can involve loss of eyebrows, eyelashes, and pubic hair. You may notice this a few days or weeks after treatment has started. Most often hair loss is temporary; your hair should grow back when treatment is done.  . Decreased appetite (decreased hunger)  . Blurred vision  . Soreness of the mouth and throat. You may have red areas, white patches, or sores that hurt.  . Effects on the bladder. This drug may cause irritation and bleeding in the bladder. You may have blood in your urine. To help stop this, you will get extra fluids to help you pass more urine. You may get a drug called mesna, which helps to decrease irritation and bleeding. You may also get a medicine to help you pass more urine. You may have a catheter (tube) placed in your bladder so  that your bladder will be washed with this drug.  Possible Side Effects (Less Common)  . Darkening of the skin or nails  . Metallic taste in the mouth  . Changes in lung tissue may happen with large amounts of this drug. These changes may not last forever, and your lung tissue may go back to normal. Sometimes these changes may not be seen for many years. You may get a cough or have trouble catching your breath.    Allergic Reactions  Serious  allergic reactions including anaphylaxis are rare. While you are getting this drug in your vein (IV), tell your nurse right away if you have any of these symptoms of an allergic reaction:  . Trouble catching your breath  . Feeling like your tongue or throat are swelling  . Feeling your heart beat quickly or in a not normal way (palpitations)  . Feeling dizzy or lightheaded  . Flushing, itching, rash, and/or hives  Treating Side Effects  . Drink 6-8 cups of fluids each day unless your doctor has told you to limit your fluid intake due to some other health problem. A cup is 8 ounces of fluid. If you throw up or have loose bowel movements you should drink more fluids so that you do not become dehydrated (lack water in the body due to losing too much fluid).  . Ask your doctor or nurse about medicine that is available to help stop or lessen nausea or throwing up.  . Mouth care is very important. Your mouth care should consist of routine, gentle cleaning of your teeth or dentures and rinsing your mouth with a mixture of 1/2 teaspoon of salt in 8 ounces of water or  teaspoon of baking soda in 8 ounces of water. This should be done at least after each meal and at bedtime.  . If you have mouth sores, avoid mouthwash that has alcohol. Also avoid alcohol and smoking because they can bother your mouth and throat.  . Talk with your nurse about getting a wig before you lose your hair. Also, call the [facility] at 800-ACS-2345 to find out  information about the " Look Good.Marland KitchenMarland KitchenFeel Better" program close to where you live. It is a free program where women undergoing chemotherapy learn about wigs, turbans and scarves as well as makeup techniques and skin and nail care.  Important Information  . Whenever you tell a doctor or nurse your health history, always tell them that you have received cyclophosphamide in the past.  . If you take this drug by mouth swallow the medicine whole. Do not chew, break or crush it.  . You can take the medicine with or without food. If you have nausea, take it with food. Do not take the pills at bedtime.  Food and Drug Interactions  There are no known interactions of cyclophosphamide with food. This drug may interact with other medicines. Tell your doctor and pharmacist about all the medicines and dietary supplements (vitamins, minerals, herbs and others) that you are taking at this time. The safety and use of dietary supplements and alternative diets are often not known. Using these might affect your cancer or interfere with your treatment. Until more is known, you should not use dietary supplements or alternative diets without your cancer doctor's help.  When to Call the Doctor  Call your doctor or nurse right away if you have any of these symptoms:  . Fever of 100.5 F (38 C) or higher  . Chills  . Bleeding or bruising that is not normal  . Blurred vision or other changes in eyesight  . Pain when passing urine; blood in urine  . Pain in your lower back or side  . Wheezing or trouble breathing  . Swelling of legs, ankles, or feet  . Feeling dizzy or lightheaded  . Feeling confused or agitated  . Signs of liver problems: dark urine, pale bowel movements, bad stomach pain, feeling very tired and weak, unusual itching, or yellowing of the eyes or  skin  . Unusual thirst or passing urine often  . Nausea that stops you from eating or drinking  . Throwing up more than 3 times a day  Call  your doctor or nurse as soon as possible if any of these symptoms happen:  . Pain in your mouth or throat that makes it hard to eat or drink  . Nausea not relieved by prescribed medicines  Sexual Problems and Reproductive Concerns  . Infertility warning: Sexual problems and reproduction concerns may happen. In both men and women, this drug may affect your ability to have children. This cannot be determined before your treatment. Talk with your doctor or nurse if you plan to have children. Ask for information on sperm or egg banking.  . In men, this drug may interfere with your ability to make sperm, but it should not change your ability to have sexual relations.  . In women, menstrual bleeding may become irregular or stop while you are getting this drug. Do not assume that you cannot become pregnant if you do not have a menstrual period.  . Women may go through signs of menopause (change of life) like vaginal dryness or itching. Vaginal lubricants can be used to lessen vaginal dryness, itching, and pain during sexual relations.  . Genetic counseling is available for you to talk about the effects of this drug therapy on future pregnancies. Also, a genetic counselor can look at the possible risk of problems in the unborn baby due to this medicine if an exposure happens during pregnancy.  . Pregnancy warning: This drug may have harmful effects on the unborn child, so effective methods of birth control should be used during your cancer treatment.  . Breast feeding warning: Women should not breast feed during treatment because this drug could enter the breast milk and badly harm a breast feeding baby.    Rituximab (Generic Name) Other Name: Rituxan  About This Drug  Rituximab is a monoclonal antibody used to treat cancer. This drug is given in the vein (IV).  This first time this is given it will be infused slower to monitor for infusion reactions.    Possible Side Effects (More  Common)  . Bone marrow depression. This is a decrease in the number of white blood cells, red blood cells, and platelets. This  may raise your risk of infection, make you tired and weak (fatigue), and raise your risk of bleeding.  . Rash-skin irritation, redness or itching (dermatitis)  . Flu-like symptoms: fever, headache, muscle and joint aches, and fatigue (low energy, feeling weak)  . Infusion-related reactions  . Hepatitis B - if you have ever had hepatitis B, the virus may come back during treatment with this drug. Your doctor will test to see if you have ever had hepatitis B prior to your treatment.  . Changes in your central nervous system can happen. The central nervous system is made up of your brain and spinal cord. You could feel: extreme tiredness, agitation, confusion, or have: hallucinations (see or hear things that are not there), trouble understanding or speaking, loss of control of your bowels or bladder, eyesight changes, numbness or lack of strength to your arms, legs, face, or body, seizures or coma. If you start to have any of these symptoms let your doctor know right away.  . Tumor lysis: This drug may act on the cancer cells very quickly. This may affect how your kidneys work. Your doctor will monitor your kidney function.  . Changes in  your liver function. Your doctor will check your liver function as needed.  . Nausea and throwing up (vomiting): these symptoms may happen within a few hours after your treatment and may last up to 24 hours. Medicines are available to stop or lessen these side effects.  . Loose bowel movements (diarrhea) that may last for a few days  . Abdominal pain  . Infections  . Cough, runny nose  . Swelling of your legs, ankles and/or feet or hands  . High blood pressure. Your doctor will check your blood pressure as needed.  . Abnormal heart beat  Possible Side Effects (Less Common)  . Shortness of breath  . Soreness of the mouth  and throat. You may have red areas, white patches, or sores that hurt.  Infusion Reactions  Infusion Reactions are the most common side effect linked to use of this drug and can be quite severe. Medicines will be given before you get the drug to lower the severity of this side effect. The infusion reactions are the worse with the first dose of the drug and become less severe with more doses of the drug. While you are getting this drug in your vein (IV), tell your nurse right away if you have any of these symptoms of an allergic reaction:  . Trouble catching your breath  . Feeling like your tongue or throat are swelling  . Feeling your heart beat quickly or in a not normal way (palpitations)  . Feeling dizzy or lightheaded  . Flushing, itching, rash, and/or hives  Treating Side Effects  . Ask your doctor or nurse about medicine to stop or lessen headache, loose bowel movements (diarrhea), constipation, nausea, throwing up (vomiting), or pain.  . If you get a rash do not put anything it unless your doctor or nurse says you may. Keep the area around the rash clean and dry. Ask your doctor for medicine if the rash bothers you.  . Drink 6-8 cups of fluids each day unless your doctor has told you to limit your fluid intake due to some other health problem. A cup is 8 ounces of fluid. If you throw up or have loose bowel movements, you should drink more fluids so that you do not become dehydrated (lack of water in the body from losing too much fluid).  . If you are not able to move your bowels, check with your doctor or nurse before you use enemas, laxatives, or suppositories  . If you have mouth sores, avoid mouthwash that has alcohol. Also avoid alcohol and smoking because they can bother your mouth and throat.  . If you have a nose bleed, sit with your head tipped slightly forward. Apply pressure by lightly pinching the bridge of your nose between your thumb and forefinger. Call your doctor  if you feel dizzy or faint or if the bleeding doesn't stop after 10 to 15 minutes  Important Information  . After treatment with this drug, vaccination with live viruses should be delayed until the immune system recovers.  . Symptoms of abnormal bleeding may be: coughing up blood, throwing up blood (may look like coffee grounds), red or black, tarry bowel movements, blood in urine, abnormally heavy menstrual flow, nosebleeds, or any unusual bleeding.  . Symptoms of high blood pressure may be: headache, blurred vision, confusion, chest pain, or a feeling that your heart is beat differently.  Marland Kitchen Urinary tract infection. Symptoms may include:  . Pain or burning when you pass urine  .  Feeling like you have to pass urine often, but not much comes out when you do.  . Tender or heavy feeling in your lower abdomen  . Cloudy urine and/or urine that smells bad.  . Pain on one side of your back under your ribs. This is where your kidneys are.  . Fever, chills, nausea and/or throwing up  Food and Drug Interactions  There are no known interactions of rituximab and any food. This drug may interact with other medicines. Tell your doctor and pharmacist about all the medicines and dietary supplements (vitamins, minerals, herbs and others) that you are taking at this time. The safety and use of dietary supplements and alternative diets are often not known. Using these might affect your cancer or interfere with your treatment. Until more is known, you should not use dietary supplements or alternative diets without your cancer doctor's help.  When to Call the Doctor  Call your doctor or nurse right away if you have any of these symptoms:  . Fever of 100.5 F (38 C) higher  . Chills  . Trouble breathing  . Rash with or without itching  . Blistering or peeling of skin  . Chest pain or symptoms of a heart attack. Most heart attacks involve pain in the center of the chest that lasts more than a few  minutes. The pain may go away and come back or it can be constant. It can feel like pressure, squeezing, fullness, or pain. Sometimes pain is felt in one or both arms, the back, neck, jaw, or stomach. If any of these symptoms last 2 minutes, call 911  . Easy bleeding or bruising  . Blood in urine or bowel movements  . Feeling that your heart is beating in a fast or not normal way (palpitations)  . Nausea that stops you from eating or drinking  . Throwing up (vomiting) more than 3 times in one day  . Abdominal pain  . Loose bowel movements (diarrhea) 4 times in one day or diarrhea with weakness or lightheadedness  . No bowel movement in 3 days or if you feel uncomfortable  . Feeling dizzy or lightheaded  . Changes in your speech or vision  . Feeling confused  . Weakness of your arms and legs or poor coordination (feeling clumsy)  . Signs of liver problems: dark urine, pale bowel movements, bad stomach pain, feeling very tired and weak, unusual itching, or yellowing of skin or eyes  . Symptoms of a urinary tract infection (see important information)  Call your doctor or nurse as soon as possible if you have any of these symptoms:  . Swelling of your legs, ankles and/or feet  . Fatigue and /or weakness that interferes with your daily activities  . Joint and muscle pain or muscle spasms that are not relieved by prescribed medicines  . Cough that lasts longer than normal    Reproduction Concerns  . Pregnancy warning: This drug is known to cross the placenta. This drug may have harmful effects on an unborn baby. Effective methods of birth control should be used during treatment with this drug and for 12 months after the last treatment. If exposure occurs to an unborn baby, the baby's immune system may be affected, which could last for months after birth. Until the immune system recovers, live vaccines should not be administered to the baby. Be sure to talk with your doctor if  you are pregnant or planning to become pregnant while getting this drug.  Marland Kitchen  Breast feeding warning: It is not known if rituximab is passed into human breast milk. In animal studies, this drug was detected in in breast milk. For this reason, women should talk to their doctor about the risks and benefits of breast feeding during treatment with this drug because this drug may enter the breast milk and badly harm a breast feeding baby.  Prednisone  About This Drug  Prednisone is a steroid that may be used to treat cancer. It is given orally (by mouth).  You will take this starting the day of chemotherapy and continue to take daily for the four days after chemotherapy.  Possible Side Effects . Abnormal heart beat  . Headache  . High blood pressure  . Increased sweating  . Blurred vision or other changes in eyesight  . Tiredness and weakness  . Changes in mood, which may include depression or a feeling of extreme well-being  . Trouble sleeping  . Feeling restless (unable to relax)  . Skin changes such as rash, dryness, or redness  . Increased appetite (increased hunger)  . Weight gain  . Electrolyte changes  . Increased risk of infections  . Aggravation of stomach ulcers  . Pain in your abdomen  . Nausea  . Blood sugar levels may change  . Swelling of your legs, ankles and/or feet  . Muscle loss and/or weakness (lack of muscle strength)  . Increased risk of developing osteoporosis- your bones may become weak and brittle  . Increased risk for cataracts, glaucoma or infections of the eye  . Changes in your liver function  Note: Not all possible side effects are included above.  Warnings and Precautions  . High blood pressure and changes in electrolytes, which can cause fluid build-up around your heart, lungs or elsewhere.  . Severe infections, which can be life-threatening.  . Allergic reactions, including anaphylaxis are rare but may happen in some patients.  Signs of allergic reaction to this drug may be swelling of the face, feeling like your tongue or throat are swelling, trouble breathing, rash, itching, fever, chills, feeling dizzy, and/or feeling that your heart is beating in a fast or not normal way. If this happens, do not take another dose of this drug. You should get urgent medical treatment.  . Increased risk of developing a hole in your stomach, small, and/or large intestine if you have ulcers in the lining of your stomach and/or intestine, or have diverticulitis, ulcerative colitis and/or other diseases that affect the gastrointestinal tract.  . Blurred vision or other changes in eyesight.  . Severe depression and other psychiatric disorders such as mood changes.  . Effects on the endocrine glands including pituitary, adrenals and thyroid during or after use of this medication.  Note: Some of the side effects above are very rare. If you have concerns and/or questions, please discuss them with your medical team.  Important Information  . Talk to your doctor or your nurse before stopping this medication, it should be stopped gradually. Depending on the dose and length of treatment, you could experience serious side effects if stopped abruptly (suddenly).  . Talk to your doctor before receiving any vaccinations during your treatment. Some vaccinations are not recommended while receiving prednisone.  How to Take Your Medication  . For Oral (by mouth): You can take the medicine with or without food, but it is preferable to be taken with food, especially If you have nausea or upset stomach.  . Missed dose: If you miss or vomit  a dose, contact your physician. Do not take 2 doses at the same time and do not double up on the next dose.  Marland Kitchen Handling: Wash your hands after handling your medicine, your caretakers should not handle your medicine with bare hands and should wear latex gloves.  . Storage: Store this medicine in the  original container at room temperature, protect from moisture. Discuss with your nurse or your doctor how to dispose of unused medicine.  Treating Side Effects  . Drink plenty of fluids (a minimum of eight glasses per day is recommended).  . To help with nausea and vomiting, eat small, frequent meals instead of three large meals a day. Choose foods and drinks that are at room temperature. Ask your nurse or doctor about other helpful tips and medicine that is available to help stop or lessen these symptoms.  . If you throw up, you should drink more fluids so that you do not become dehydrated (lack of water in the body from losing too much fluid).  . Manage tiredness by pacing your activities for the day. Be sure to include periods of rest between energy-draining activities.  . To help with muscle weakness, get regular exercise. If you feel too tired to exercise vigorously, try taking a short walk.  . If you are having trouble sleeping, talk to your nurse or doctor on tips to help you sleep better.  . If you are feeling depressed, talk to your nurse or doctor about it.  Marland Kitchen Keeping your pain under control is important to your well-being. Please tell your doctor or nurse if you are experiencing pain.  . If you have diabetes, keep good control of your blood sugar level. Tell your nurse or your doctor if your glucose levels are higher or lower than normal.  . To decrease the risk of infection, wash your hands regularly.  . Avoid close contact with people who have a cold, the flu, or other infections.  . Take your temperature as your doctor or nurse tells you, and whenever you feel like you may have a fever.  . If you get a rash do not put anything on it unless your doctor or nurse says you may. Keep the area around the rash clean and dry. Ask your doctor for medicine if your rash bothers you.  . Moisturize your skin several times a day  . Avoid sun exposure and apply sunscreen  routinely when outdoors.  Food and Drug Interactions  . This drug may interact with grapefruit and grapefruit juice. Talk to your doctor as this could make side effects worse.  . Check with your doctor or pharmacist about all other prescription medicines and over-the-counter medicines and dietary supplements (vitamins, minerals, herbs and others) you are taking before starting this medicine as there are known drug interactions with prednisone. Also, check with your doctor or pharmacist before starting any new prescription or over-the-counter medicines, or dietary supplements to make sure that there are no interactions.  . There are known interactions of prednisone with other medicines and products like aspirin, and other nonsteroidal anti-inflammatory agents. Ask your doctor what over-the-counter (OTC) medicines you can take.  . There are known interactions of prednisone with blood thinning medicine such as warfarin. Ask your doctor what precautions you should take.  Marland Kitchen Avoid the use of St. John's Wort while taking prednisone as this may lower the levels of the drug in your body, which can make it less effective.  When to Call the Doctor  Call your doctor or nurse if you have any of these symptoms and/or any new or unusual symptoms:  . Fever of 100.4 F (38 C) or higher  . Chills  . A headache that does not go away  . Blurry vision or other changes in eyesight  . Feel irritable, nervous or restless  . Trouble sleeping  . Tiredness or weakness that interferes with your daily activities  . Trouble breathing  . Feeling that your heart is beating in a fast or not normal way (palpitations)  . Chest pain or symptoms of a heart attack. Most heart attacks involve pain in the center of the chest that lasts more than a few minutes. The pain may go away and come back or it can be constant. It can feel like pressure, squeezing, fullness, or pain. Sometimes pain is felt in one or  both arms, the back, neck, jaw, or stomach. If any of these symptoms last 2 minutes, call 911.  . Nausea that stops you from eating or drinking and/or is not relieved by prescribed medicines  . Throwing up more than 3 times a day  . Severe abdominal pain that does not go away  . Heartburn or indigestion  . Abnormal blood sugar  . Severe mood changes such as depression or unusual thoughts and/or behaviors  . Thoughts of hurting yourself or others, and suicide  . Feeling hopeless on most days  . Unusual thirst, passing urine often, headache, sweating, shakiness, irritability  . Swelling of legs, ankles, or feet  . Weight gain of 5 pounds in one week (fluid retention)  . A new rash or a rash that is not relieved by prescribed medicines  . Signs of possible liver problems: dark urine, pale bowel movements, bad stomach pain, feeling very tired and weak, unusual itching, or yellowing of the eyes or skin  . Signs of allergic reaction: swelling of the face, feeling like your tongue or throat are swelling, trouble breathing, rash, itching, fever, chills, feeling dizzy, and/or feeling that your heart is beating in a fast or not normal way. If this happens, call 911 for emergency care.  . If you think you may be pregnant  Reproduction Warnings  . Pregnancy warning: It is not known if this drug may harm an unborn child. For this reason, be sure to talk with your doctor if you are pregnant or planning to become pregnant while receiving this drug. Let your doctor know right away if you think you may be pregnant.  . Breastfeeding warning: It is not known if this drug passes into breast milk. For this reason, women should talk to their doctor about the risks and benefits of breastfeeding during treatment with this drug because this drug may enter the breast milk and cause harm to a breastfeeding baby.  . Fertility warning: Human fertility studies have not been done with this drug. Talk  with your doctor or nurse if you plan to have children. Ask for information on sperm or egg banking.  You will get Neulasta (or similar) after every treatment of chemo:  Neulasta (or similar) - this medication is not chemotherapy but is being given because you have had chemo. It is usually given 24-48 hours after the completion of chemotherapy. This medication works by boosting your bone marrow's supply of white blood cells. White blood cells are what protect our bodies against infection. The medication is given in the form of a subcutaneous injection. It is given in the fatty tissue  of your abdomen, or in the skin on the back of your arm . It is a short needle. The major side effect of this medication is bone or muscle pain. The drug of choice to relieve or lessen the pain is Aleve or Ibuprofen. If a physician has ever told you not to take Aleve or Ibuprofen - then don't take it. You should then take Tylenol/acetaminophen. Take either medication as the bottle directs you to.  The level of pain you experience as a result of this injection can range from none, to mild or moderate, or severe. Please let us know if you develop moderate or severe bone pain.   You can take Claritin 10 mg over the counter for a few days after receiving Neulasta to help with the bone aches and pains.      SELF CARE ACTIVITIES WHILE RECEIVING CHEMOTHERAPY:  Hydration Increase your fluid intake 48 hours prior to treatment and drink at least 8 to 12 cups (64 ounces) of water/decaffeinated beverages per day after treatment. You can still have your cup of coffee or soda but these beverages do not count as part of your 8 to 12 cups that you need to drink daily. No alcohol intake.  Medications Continue taking your normal prescription medication as prescribed.  If you start any new herbal or new supplements please let us know first to make sure it is safe.  Mouth Care Have teeth cleaned professionally before starting treatment.  Keep dentures and partial plates clean. Use soft toothbrush and do not use mouthwashes that contain alcohol. Biotene is a good mouthwash that is available at most pharmacies or may be ordered by calling [phone]. Use warm salt water gargles (1 teaspoon salt per 1 quart warm water) before and after meals and at bedtime. If you need dental work, please let the doctor know before you go for your appointment so that we can coordinate the best possible time for you in regards to your chemo regimen. You need to also let your dentist know that you are actively taking chemo. We may need to do labs prior to your dental appointment.  Skin Care Always use sunscreen that has not expired and with SPF (Sun Protection Factor) of 50 or higher. Wear hats to protect your head from the sun. Remember to use sunscreen on your hands, ears, face, & feet.  Use good moisturizing lotions such as udder cream, eucerin, or even Vaseline. Some chemotherapies can cause dry skin, color changes in your skin and nails.    . Avoid long, hot showers or baths. . Use gentle, fragrance-free soaps and laundry detergent. . Use moisturizers, preferably creams or ointments rather than lotions because the thicker consistency is better at preventing skin dehydration. Apply the cream or ointment within 15 minutes of showering. Reapply moisturizer at night, and moisturize your hands every time after you wash them.  Hair Loss (if your doctor says your hair will fall out)  . If your doctor says that your hair is likely to fall out, decide before you begin chemo whether you want to wear a wig. You may want to shop before treatment to match your hair color. . Hats, turbans, and scarves can also camouflage hair loss, although some people prefer to leave their heads uncovered. If you go bare-headed outdoors, be sure to use sunscreen on your scalp. . Cut your hair short. It eases the inconvenience of shedding lots of hair, but it also can reduce the  emotional impact of  watching your hair fall out. . Don't perm or color your hair during chemotherapy. Those chemical treatments are already damaging to hair and can enhance hair loss. Once your chemo treatments are done and your hair has grown back, it's OK to resume dyeing or perming hair.  With chemotherapy, hair loss is almost always temporary. But when it grows back, it may be a different color or texture. In older adults who still had hair color before chemotherapy, the new growth may be completely gray.  Often, new hair is very fine and soft.  Infection Prevention Please wash your hands for at least 30 seconds using warm soapy water. Handwashing is the #1 way to prevent the spread of germs. Stay away from sick people or people who are getting over a cold. If you develop respiratory systems such as green/yellow mucus production or productive cough or persistent cough let us know and we will see if you need an antibiotic. It is a good idea to keep a pair of gloves on when going into grocery stores/[employer] to decrease your risk of coming into contact with germs on the carts, etc. Carry alcohol hand gel with you at all times and use it frequently if out in public. If your temperature reaches 100.5 or higher please call the clinic and let us know.  If it is after hours or on the weekend please go to the ER if your temperature is over 100.5.  Please have your own personal thermometer at home to use.    Sex and bodily fluids If you are going to have sex, a condom must be used to protect the person that isn't taking chemotherapy. Chemo can decrease your libido (sex drive). For a few days after chemotherapy, chemotherapy can be excreted through your bodily fluids.  When using the toilet please close the lid and flush the toilet twice.  Do this for a few day after you have had chemotherapy.   Effects of chemotherapy on your sex life Some changes are simple and won't last long. They won't affect your sex life  permanently.  Sometimes you may feel: . too tired . not strong enough to be very active . sick or sore  . not in the mood . anxious or low Your anxiety might not seem related to sex. For example, you may be worried about the cancer and how your treatment is going. Or you may be worried about money, or about how you family are coping with your illness. These things can cause stress, which can affect your interest in sex. It's important to talk to your partner about how you feel. Remember - the changes to your sex life don't usually last long. There's usually no medical reason to stop having sex during chemo. The drugs won't have any long term physical effects on your performance or enjoyment of sex. Cancer can't be passed on to your partner during sex  Contraception It's important to use reliable contraception during treatment. Avoid getting pregnant while you or your partner are having chemotherapy. This is because the drugs may harm the baby. Sometimes chemotherapy drugs can leave a man or woman infertile.  This means you would not be able to have children in the future. You might want to talk to someone about permanent infertility. It can be very difficult to learn that you may no longer be able to have children. Some people find counselling helpful. There might be ways to preserve your fertility, although this is easier for men than  for women. You may want to speak to a fertility expert. You can talk about sperm banking or harvesting your eggs. You can also ask about other fertility options, such as donor eggs. If you have or have had breast cancer, your doctor might advise you not to take the contraceptive pill. This is because the hormones in it might affect the cancer.  It is not known for sure whether or not chemotherapy drugs can be passed on through semen or secretions from the vagina. Because of this some doctors advise people to use a barrier method if you have sex during treatment. This  applies to vaginal, anal or oral sex. Generally, doctors advise a barrier method only for the time you are actually having the treatment and for about a week after your treatment. Advice like this can be worrying, but this does not mean that you have to avoid being intimate with your partner. You can still have close contact with your partner and continue to enjoy sex.  Animals If you have cats or birds we just ask that you not change the litter or change the cage.  Please have someone else do this for you while you are on chemotherapy.   Food Safety During and After Cancer Treatment Food safety is important for people both during and after cancer treatment. Cancer and cancer treatments, such as chemotherapy, radiation therapy, and stem cell/bone marrow transplantation, often weaken the immune system. This makes it harder for your body to protect itself from foodborne illness, also called food poisoning. Foodborne illness is caused by eating food that contains harmful bacteria, parasites, or viruses.  Foods to avoid Some foods have a higher risk of becoming tainted with bacteria. These include: Marland Kitchen Unwashed fresh fruit and vegetables, especially leafy vegetables that can hide dirt and other contaminants . Raw sprouts, such as alfalfa sprouts . Raw or undercooked beef, especially ground beef, or other raw or undercooked meat and poultry . Fatty, fried, or spicy foods immediately before or after treatment.  These can sit heavy on your stomach and make you feel nauseous. . Raw or undercooked shellfish, such as oysters. . Sushi and sashimi, which often contain raw fish.  . Unpasteurized beverages, such as unpasteurized fruit juices, raw milk, raw yogurt, or cider . Undercooked eggs, such as soft boiled, over easy, and poached; raw, unpasteurized eggs; or foods made with raw egg, such as homemade raw cookie dough and homemade mayonnaise  Simple steps for food safety  Shop smart. . Do not buy food  stored or displayed in an unclean area. . Do not buy bruised or damaged fruits or vegetables. . Do not buy cans that have cracks, dents, or bulges. . Pick up foods that can spoil at the end of your shopping trip and store them in a cooler on the way home.  Prepare and clean up foods carefully. . Rinse all fresh fruits and vegetables under running water, and dry them with a clean towel or paper towel. . Clean the top of cans before opening them. . After preparing food, wash your hands for 20 seconds with hot water and soap. Pay special attention to areas between fingers and under nails. . Clean your utensils and dishes with hot water and soap. Marland Kitchen Disinfect your kitchen and cutting boards using 1 teaspoon of liquid, unscented bleach mixed into 1 quart of water.    Dispose of old food. . Eat canned and packaged food before its expiration date (the "use by" or "best  before" date). . Consume refrigerated leftovers within 3 to 4 days. After that time, throw out the food. Even if the food does not smell or look spoiled, it still may be unsafe. Some bacteria, such as Listeria, can grow even on foods stored in the refrigerator if they are kept for too long.  Take precautions when eating out. . At restaurants, avoid buffets and salad bars where food sits out for a long time and comes in contact with many people. Food can become contaminated when someone with a virus, often a norovirus, or another "bug" handles it. . Put any leftover food in a "to-go" container yourself, rather than having the server do it. And, refrigerate leftovers as soon as you get home. . Choose restaurants that are clean and that are willing to prepare your food as you order it cooked.   AT HOME MEDICATIONS:                                                                                                                                                                Compazine/Prochlorperazine 10mg  tablet. Take 1 tablet every 6 hours  as needed for nausea/vomiting. (This can make you sleepy)   EMLA cream. Apply a quarter size amount to port site 1 hour prior to chemo. Do not rub in. Cover with plastic wrap.    Diarrhea Sheet   If you are having loose stools/diarrhea, please purchase Imodium and begin taking as outlined:  At the first sign of poorly formed or loose stools you should begin taking Imodium (loperamide) 2 mg capsules.  Take two tablets (4mg ) followed by one tablet (2mg ) every 2 hours - DO NOT EXCEED 8 tablets in 24 hours.  If it is bedtime and you are having loose stools, take 2 tablets at bedtime, then 2 tablets every 4 hours until morning.   Always call the [facility] if you are having loose stools/diarrhea that you can't get under control.  Loose stools/diarrhea leads to dehydration (loss of water) in your body.  We have other options of trying to get the loose stools/diarrhea to stop but you must let us know!   Constipation Sheet  Colace - 100 mg capsules - take 2 capsules daily.  If this doesn't help then you can increase to 2 capsules twice daily.  Please call if the above does not work for you. Do not go more than 2 days without a bowel movement.  It is very important that you do not become constipated.  It will make you feel sick to your stomach (nausea) and can cause abdominal pain and vomiting.  Nausea Sheet   Compazine/Prochlorperazine 10mg  tablet. Take 1 tablet every 6 hours as needed for nausea/vomiting (This can make you drowsy).  If you are having persistent  nausea (nausea that does not stop) please call the [facility] and let us know the amount of nausea that you are experiencing.  If you begin to vomit, you need to call the [facility] and if it is the weekend and you have vomited more than one time and can't get it to stop-go to the Emergency Room.  Persistent nausea/vomiting can lead to dehydration (loss of fluid in your body) and will make you feel very weak and unwell. Ice chips, sips  of clear liquids, foods that are at room temperature, crackers, and toast tend to be better tolerated.    SYMPTOMS TO REPORT AS SOON AS POSSIBLE AFTER TREATMENT:  FEVER GREATER THAN 100.5 F  CHILLS WITH OR WITHOUT FEVER  NAUSEA AND VOMITING THAT IS NOT CONTROLLED WITH YOUR NAUSEA MEDICATION  UNUSUAL SHORTNESS OF BREATH  UNUSUAL BRUISING OR BLEEDING  TENDERNESS IN MOUTH AND THROAT WITH OR WITHOUT   PRESENCE OF ULCERS  URINARY PROBLEMS  BOWEL PROBLEMS  UNUSUAL RASH      Wear comfortable clothing and clothing appropriate for easy access to any Portacath or PICC line. Let us know if there is anything that we can do to make your therapy better!    What to do if you need assistance after hours or on the weekends: CALL [phone].  HOLD on the line, do not hang up.  You will hear multiple messages but at the end you will be connected with a nurse triage line.  They will contact the doctor if necessary.  Most of the time they will be able to assist you.  Do not call the hospital operator.      I have been informed and understand all of the instructions given to me and have received a copy. I have been instructed to call the clinic [phone] or my family physician as soon as possible for continued medical care, if indicated. I do not have any more questions at this time but understand that I may call the [facility] or the Patient Navigator at [phone] during office hours should I have questions or need assistance in obtaining follow-up care.

## 2019-04-27 NOTE — Patient Instructions (Addendum)
Check with the pharmacy to see if they have your hydrocodone prescription. If not, call Dr. Edgardo Roys office at [phone].  Make sure you have an appointment with Dr. Alyson Ingles (urologist) for a 4 week hospital follow-up. Phone: [phone]   [place] stands for "Dietary Approaches to Stop Hypertension." The DASH eating plan is a healthy eating plan that has been shown to reduce high blood pressure (hypertension). It may also reduce your risk for type 2 diabetes, heart disease, and stroke. The DASH eating plan may also help with weight loss. What are tips for following this plan?  General guidelines  Avoid eating more than 2,300 mg (milligrams) of salt (sodium) a day. If you have hypertension, you may need to reduce your sodium intake to 1,500 mg a day.  Limit alcohol intake to no more than 1 drink a day for nonpregnant women and 2 drinks a day for men. One drink equals 12 oz of beer, 5 oz of wine, or 1 oz of hard liquor.  Work with your health care provider to maintain a healthy body weight or to lose weight. Ask what an ideal weight is for you.  Get at least 30 minutes of exercise that causes your heart to beat faster (aerobic exercise) most days of the week. Activities may include walking, swimming, or biking.  Work with your health care provider or diet and nutrition specialist (dietitian) to adjust your eating plan to your individual calorie needs. Reading food labels   Check food labels for the amount of sodium per serving. Choose foods with less than 5 percent of the Daily Value of sodium. Generally, foods with less than 300 mg of sodium per serving fit into this eating plan.  To find whole grains, look for the word "whole" as the first word in the ingredient list. Shopping  Buy products labeled as "low-sodium" or "no salt added."  Buy fresh foods. Avoid canned foods and premade or frozen meals. Cooking  Avoid adding salt when cooking. Use salt-free  seasonings or herbs instead of table salt or sea salt. Check with your health care provider or pharmacist before using salt substitutes.  Do not fry foods. Cook foods using healthy methods such as baking, boiling, grilling, and broiling instead.  Cook with heart-healthy oils, such as olive, canola, soybean, or sunflower oil. Meal planning  Eat a balanced diet that includes: ? 5 or more servings of fruits and vegetables each day. At each meal, try to fill half of your plate with fruits and vegetables. ? Up to 6-8 servings of whole grains each day. ? Less than 6 oz of lean meat, poultry, or fish each day. A 3-oz serving of meat is about the same size as a deck of cards. One egg equals 1 oz. ? 2 servings of low-fat dairy each day. ? A serving of nuts, seeds, or beans 5 times each week. ? Heart-healthy fats. Healthy fats called Omega-3 fatty acids are found in foods such as flaxseeds and coldwater fish, like sardines, salmon, and mackerel.  Limit how much you eat of the following: ? Canned or prepackaged foods. ? Food that is high in trans fat, such as fried foods. ? Food that is high in saturated fat, such as fatty meat. ? Sweets, desserts, sugary drinks, and other foods with added sugar. ? Full-fat dairy products.  Do not salt foods before eating.  Try to eat at least 2 vegetarian meals each week.  Eat more home-cooked food and less  restaurant, buffet, and fast food.  When eating at a restaurant, ask that your food be prepared with less salt or no salt, if possible. What foods are recommended? The items listed may not be a complete list. Talk with your dietitian about what dietary choices are best for you. Grains Whole-grain or whole-wheat bread. Whole-grain or whole-wheat pasta. Brown rice. [place] Morrow. Bulgur. Whole-grain and low-sodium cereals. Pita bread. Low-fat, low-sodium crackers. Whole-wheat flour tortillas. Vegetables Fresh or frozen vegetables (raw, steamed, roasted, or  grilled). Low-sodium or reduced-sodium tomato and vegetable juice. Low-sodium or reduced-sodium tomato sauce and tomato paste. Low-sodium or reduced-sodium canned vegetables. Fruits All fresh, dried, or frozen fruit. Canned fruit in natural juice (without added sugar). Meat and other protein foods Skinless chicken or [place]. Ground chicken or [place]. Pork with fat trimmed off. Fish and seafood. Egg whites. Dried beans, peas, or lentils. Unsalted nuts, nut butters, and seeds. Unsalted canned beans. Lean cuts of beef with fat trimmed off. Low-sodium, lean deli meat. Dairy Low-fat (1%) or fat-free (skim) milk. Fat-free, low-fat, or reduced-fat cheeses. Nonfat, low-sodium ricotta or cottage cheese. Low-fat or nonfat yogurt. Low-fat, low-sodium cheese. Fats and oils Soft margarine without trans fats. Vegetable oil. Low-fat, reduced-fat, or light mayonnaise and salad dressings (reduced-sodium). Canola, safflower, olive, soybean, and sunflower oils. Avocado. Seasoning and other foods Herbs. Spices. Seasoning mixes without salt. Unsalted popcorn and pretzels. Fat-free sweets. What foods are not recommended? The items listed may not be a complete list. Talk with your dietitian about what dietary choices are best for you. Grains Baked goods made with fat, such as croissants, muffins, or some breads. Dry pasta or rice meal packs. Vegetables Creamed or fried vegetables. Vegetables in a cheese sauce. Regular canned vegetables (not low-sodium or reduced-sodium). Regular canned tomato sauce and paste (not low-sodium or reduced-sodium). Regular tomato and vegetable juice (not low-sodium or reduced-sodium). Angie Fava. Olives. Fruits Canned fruit in a light or heavy syrup. Fried fruit. Fruit in cream or butter sauce. Meat and other protein foods Fatty cuts of meat. Ribs. Fried meat. Berniece Salines. Sausage. Bologna and other processed lunch meats. Salami. Fatback. Hotdogs. Bratwurst. Salted nuts and seeds. Canned beans with  added salt. Canned or smoked fish. Whole eggs or egg yolks. Chicken or [place] with skin. Dairy Whole or 2% milk, cream, and half-and-half. Whole or full-fat cream cheese. Whole-fat or sweetened yogurt. Full-fat cheese. Nondairy creamers. Whipped toppings. Processed cheese and cheese spreads. Fats and oils Butter. Stick margarine. Lard. Shortening. Ghee. Bacon fat. Tropical oils, such as coconut, palm kernel, or palm oil. Seasoning and other foods Salted popcorn and pretzels. Onion salt, garlic salt, seasoned salt, table salt, and sea salt. Worcestershire sauce. Tartar sauce. Barbecue sauce. Teriyaki sauce. Soy sauce, including reduced-sodium. Steak sauce. Canned and packaged gravies. Fish sauce. Oyster sauce. Cocktail sauce. Horseradish that you find on the shelf. Ketchup. Mustard. Meat flavorings and tenderizers. Bouillon cubes. Hot sauce and Tabasco sauce. Premade or packaged marinades. Premade or packaged taco seasonings. Relishes. Regular salad dressings. Where to find more information:  National Heart, Lung, and [place]: https://wilson-eaton.com/  American Heart Association: www.heart.org Summary  The DASH eating plan is a healthy eating plan that has been shown to reduce high blood pressure (hypertension). It may also reduce your risk for type 2 diabetes, heart disease, and stroke.  With the DASH eating plan, you should limit salt (sodium) intake to 2,300 mg a day. If you have hypertension, you may need to reduce your sodium intake to 1,500 mg a day.  When on the DASH eating plan, aim to eat more fresh fruits and vegetables, whole grains, lean proteins, low-fat dairy, and heart-healthy fats.  Work with your health care provider or diet and nutrition specialist (dietitian) to adjust your eating plan to your individual calorie needs. This information is not intended to replace advice given to you by your health care provider. Make sure you discuss any questions you have with your health care  provider. Document Released: 06/06/2011 Document Revised: 05/30/2017 Document Reviewed: 06/10/2016 Elsevier Patient Education  2020 [employer].

## 2019-04-27 NOTE — Progress Notes (Signed)
New Patient Office Visit  Assessment & Plan:  1. Essential hypertension - Uncontrolled without medications; refills sent today. Encouraged patient to check BP at home and keep a log to bring to the next appointment. Diet and exercise encouraged. Education provided on the DASH diet.  - amLODipine (NORVASC) 10 MG tablet; Take 1 tablet (10 mg total) by mouth daily.  Dispense: 90 tablet; Refill: 1 - metoprolol succinate (TOPROL-XL) 50 MG 24 hr tablet; Take 1 tablet (50 mg total) by mouth daily.  Dispense: 90 tablet; Refill: 1 - losartan (COZAAR) 100 MG tablet; Take 1 tablet (100 mg total) by mouth daily.  Dispense: 90 tablet; Refill: 1  2. Leg DVT (deep venous thromboembolism), acute, bilateral (HCC) - Continue Eliquis.   3. Diffuse large B-cell lymphoma of lymph nodes of multiple regions [facility]) - Managed by Dr. Delton Coombes; patient to keep all appointments.   4. Hydronephrosis of right kidney - Patient to keep appointment with urologist, Dr. Alyson Ingles.    Follow-up: Return in about 4 weeks (around 05/25/2019) for HTN.   Kevin Limes, MSN, APRN, FNP-C [facility]  Subjective:  Patient ID: Kevin [name], male    DOB: [birth year]  Age: 63 y.o. MRN: [id number]  Patient Care Team: Loman Brooklyn, FNP as PCP - General (Family Medicine)  CC:  Chief Complaint  Patient presents with   New Patient (Initial Visit)    [name]   Hospitalization Follow-up    Hypoxia - 10-17 [facility]    HPI Kevin [name] presents to establish care. He is transferring care from Dr. Murrell Redden office as he has retired and the office has closed.  Patient was admitted to the hospital from 04/17/2019-04/22/2019. He presented to the ER due to a sudden onset of shortness of breath. He was diagnosed with bilateral DVTs, pulmonary embolus, lymphoma and hydronephrosis. He had a cystoscopy with bilateral JJ stent placement and a right inguinal lymph node excisional biopsy. He was  discharged with home oxygen at 2L via [place], Eliquis, Tramadol, and instructions to follow-up with PCP, hematology/oncology, urology, and general surgery.   He had his follow-up with hematology/oncology (Dr. Delton Coombes) on 04/26/2019. He has a PET scan scheduled for 05/03/2019. His Tramadol was to be changed to Hydrocodone as patient was not getting relief from the Tramadol. Patient reports when he went to the pharmacy they did not have the new prescription.   Next appointment with Dr. Constance Haw on 05/07/2019.   Unable to see appointment with Dr. Alyson Ingles in Epic but patient reports he has a list of appointments at home.    Patient in need of refills of BP medications as he has run out of all but one.    Review of Systems  Constitutional: Negative for chills, fever, malaise/fatigue and weight loss.  HENT: Negative for congestion, ear discharge, ear pain, nosebleeds, sinus pain, sore throat and tinnitus.   Eyes: Negative for blurred vision, double vision, pain, discharge and redness.  Respiratory: Negative for cough, shortness of breath and wheezing.   Cardiovascular: Positive for leg swelling. Negative for chest pain and palpitations.  Gastrointestinal: Negative for abdominal pain, constipation, diarrhea, heartburn, nausea and vomiting.  Genitourinary: Positive for hematuria. Negative for dysuria, frequency and urgency.  Musculoskeletal: Negative for myalgias.  Skin: Negative for rash.  Neurological: Negative for dizziness, seizures, weakness and headaches.  Psychiatric/Behavioral: Negative for substance abuse and suicidal ideas.    Current Outpatient Medications:    amLODipine (NORVASC) 10 MG tablet, Take 1  tablet (10 mg total) by mouth daily., Disp: 90 tablet, Rfl: 1   apixaban (ELIQUIS) 5 MG TABS tablet, Take 1 tablet (5 mg total) by mouth 2 (two) times daily. (Patient taking differently: Take 5 mg by mouth 2 (two) times daily. Pt will start taking 1 tablet starting 04/29/19.), Disp: 60  tablet, Rfl: 3   HYDROcodone-acetaminophen (NORCO/VICODIN) 5-325 MG tablet, Take 1 tablet by mouth every 8 (eight) hours as needed for pain., Disp: , Rfl:    metoprolol succinate (TOPROL-XL) 50 MG 24 hr tablet, Take 1 tablet (50 mg total) by mouth daily., Disp: 90 tablet, Rfl: 1   OXYGEN, Inhale 2 L into the lungs continuous., Disp: , Rfl:    traMADol (ULTRAM) 50 MG tablet, Take 1 tablet (50 mg total) by mouth every 6 (six) hours as needed for moderate pain or severe pain., Disp: 15 tablet, Rfl: 0   allopurinol (ZYLOPRIM) 300 MG tablet, Take 1 tablet (300 mg total) by mouth daily., Disp: 30 tablet, Rfl: 3   [START ON 05/11/2019] CYCLOPHOSPHAMIDE IV, Inject into the vein every 21 ( twenty-one) days., Disp: , Rfl:    [START ON 05/11/2019] DOXORUBICIN HCL IV, Inject into the vein every 21 ( twenty-one) days., Disp: , Rfl:    enoxaparin (LOVENOX) 150 MG/ML injection, Inject 0.92 mLs (140 mg total) into the skin daily. Take lovenox shot once daily in the AM on Wednesday 11/4 and Thursday 11/5 prior to your surgery., Disp: 3 mL, Rfl: 0   lidocaine-prilocaine (EMLA) cream, Apply a small amount to port a cath site and cover with plastic wrap one hour prior to chemotherapy appointments, Disp: 30 g, Rfl: 3   losartan (COZAAR) 100 MG tablet, Take 1 tablet (100 mg total) by mouth daily., Disp: 90 tablet, Rfl: 1   predniSONE (DELTASONE) 20 MG tablet, Take 5 tablets (100 mg total) by mouth daily. Take on days 1-5 of chemotherapy., Disp: 150 tablet, Rfl: 0   prochlorperazine (COMPAZINE) 10 MG tablet, Take 1 tablet (10 mg total) by mouth every 6 (six) hours as needed (Nausea or vomiting)., Disp: 30 tablet, Rfl: 6   [START ON 05/11/2019] RITUXIMAB IV, Inject into the vein every 21 ( twenty-one) days., Disp: , Rfl:    [START ON 05/11/2019] vinCRIStine 2 mg in sodium chloride 0.9 % 50 mL, Inject 2 mg into the vein every 21 ( twenty-one) days., Disp: , Rfl:   No Known Allergies  Past Medical History:    Diagnosis Date   Diffuse large B cell lymphoma ([place]) 04/26/2019   Hyperlipidemia 02/01/2014   Hypertension    Leg DVT (deep venous thromboembolism), acute, bilateral ([place]) 04/18/2019   Port-A-Cath in place 04/27/2019    Past Surgical History:  Procedure Laterality Date   CYSTOSCOPY W/ URETERAL STENT PLACEMENT Bilateral 04/21/2019   Procedure: CYSTOSCOPY WITH RETROGRADE PYELOGRAM/URETERAL STENT PLACEMENT;  Surgeon: Cleon Gustin, MD;  Location: [facility];  Service: Urology;  Laterality: Bilateral;   INGUINAL LYMPH NODE BIOPSY Right 04/21/2019   Procedure: INGUINAL LYMPH NODE BIOPSY;  Surgeon: Virl Cagey, MD;  Location: [facility];  Service: General;  Laterality: Right;    Family History  Problem Relation Age of Onset   Diabetes Brother    Diabetes Mother    Leukemia Brother    Diabetes Son     Social History   Socioeconomic History   Marital status: Married    Spouse name: Not on file   Number of children: Not on file   Years of education: Not  on file   Highest education level: Not on file  Occupational History   Not on file  Social Needs   Financial resource strain: Not on file   Food insecurity    Worry: Not on file    Inability: Not on file   Transportation needs    Medical: Not on file    Non-medical: Not on file  Tobacco Use   Smoking status: Former Smoker    Quit date: 04/27/1999    Years since quitting: 20.0   Smokeless tobacco: Never Used  Substance and Sexual Activity   Alcohol use: Yes    Alcohol/week: 56.0 standard drinks    Types: 56 Cans of beer per week   Drug use: Never   Sexual activity: Not on file  Lifestyle   Physical activity    Days per week: Not on file    Minutes per session: Not on file   Stress: Not on file  Relationships   Social connections    Talks on phone: Not on file    Gets together: Not on file    Attends religious service: Not on file    Active member of club or organization: Not on file     Attends meetings of clubs or organizations: Not on file    Relationship status: Not on file   Intimate partner violence    Fear of current or ex partner: Not on file    Emotionally abused: Not on file    Physically abused: Not on file    Forced sexual activity: Not on file  Other Topics Concern   Not on file  Social History Narrative   Not on file    Objective:   Today's Vitals: BP (!) 169/84    Pulse 71    Temp (!) 97.5 F (36.4 C) (Temporal)    Ht 5\' 6"  (1.676 m)    Wt 203 lb (92.1 kg)    SpO2 (!) 89%    BMI 32.77 kg/m   Physical Exam Vitals signs reviewed.  Constitutional:      General: He is not in acute distress.    Appearance: Normal appearance. He is obese. He is not ill-appearing, toxic-appearing or diaphoretic.  HENT:     Head: Normocephalic and atraumatic.  Eyes:     General: No scleral icterus.       Right eye: No discharge.        Left eye: No discharge.     Conjunctiva/sclera: Conjunctivae normal.  Neck:     Musculoskeletal: Normal range of motion.  Cardiovascular:     Rate and Rhythm: Normal rate and regular rhythm.     Heart sounds: Normal heart sounds. No murmur. No friction rub. No gallop.   Pulmonary:     Effort: Pulmonary effort is normal. No respiratory distress.     Breath sounds: Normal breath sounds. No stridor. No wheezing, rhonchi or rales.  Musculoskeletal: Normal range of motion.  Skin:    General: Skin is warm and dry.  Neurological:     Mental Status: He is alert and oriented to person, place, and time. Mental status is at baseline.  Psychiatric:        Mood and Affect: Mood normal.        Behavior: Behavior normal.        Thought Content: Thought content normal.        Judgment: Judgment normal.

## 2019-04-28 ENCOUNTER — Telehealth: Payer: Self-pay | Admitting: General Surgery

## 2019-04-28 LAB — BETA 2 MICROGLOBULIN, SERUM: Beta-2 Microglobulin: 3.2 mg/L — ABNORMAL HIGH (ref 0.6–2.4)

## 2019-04-28 MED ORDER — ENOXAPARIN SODIUM 150 MG/ML ~~LOC~~ SOLN: 1.5000 mg/kg | SUBCUTANEOUS | 0 refills | Status: DC

## 2019-04-28 NOTE — Telephone Encounter (Signed)
[place] for port a catheter on 11/6. Will need to stop the Eliquis.  Stop Eliquis on Tuesday. Lovenox shots for Wednesday and Thursday and will hold it on Friday AM. Talked to his wife Butch Penny.  Discussed dosing with pharmacy, opted for 1.5mg /kg once daily dosing.   Curlene Labrum, MD [facility] [address] [place], [place] [address] [phone]/ [phone] (office)

## 2019-05-03 ENCOUNTER — Ambulatory Visit (HOSPITAL_COMMUNITY)
Admission: RE | Admit: 2019-05-03 | Discharge: 2019-05-03 | Disposition: A | Discharge status: Home / Self Care | Payer: BLUE CROSS/BLUE SHIELD | Source: Ambulatory Visit | Attending: Hematology | Admitting: Hematology

## 2019-05-03 ENCOUNTER — Other Ambulatory Visit: Payer: Self-pay

## 2019-05-03 DIAGNOSIS — C8330 Diffuse large B-cell lymphoma, unspecified site: Secondary | ICD-10-CM | POA: Diagnosis not present

## 2019-05-03 LAB — GLUCOSE, CAPILLARY: Glucose-Capillary: 103 mg/dL — ABNORMAL HIGH (ref 70–99)

## 2019-05-03 MED ORDER — FLUDEOXYGLUCOSE F - 18 (FDG) INJECTION
10.6600 | Freq: Once | INTRAVENOUS | Status: AC
  Administered 2019-05-03: 13:00:00 10.66 via INTRAVENOUS

## 2019-05-04 ENCOUNTER — Encounter (HOSPITAL_COMMUNITY)
Admission: RE | Admit: 2019-05-04 | Discharge: 2019-05-04 | Disposition: A | Discharge status: Home / Self Care | Payer: BLUE CROSS/BLUE SHIELD | Source: Ambulatory Visit | Attending: General Surgery | Admitting: General Surgery

## 2019-05-04 ENCOUNTER — Encounter (HOSPITAL_COMMUNITY): Payer: Self-pay | Admitting: Hematology

## 2019-05-04 ENCOUNTER — Inpatient Hospital Stay (HOSPITAL_COMMUNITY): Payer: BLUE CROSS/BLUE SHIELD | Attending: Hematology

## 2019-05-04 ENCOUNTER — Other Ambulatory Visit (HOSPITAL_COMMUNITY): Payer: Self-pay | Admitting: Hematology

## 2019-05-04 ENCOUNTER — Inpatient Hospital Stay (HOSPITAL_COMMUNITY): Payer: BLUE CROSS/BLUE SHIELD | Admitting: General Practice

## 2019-05-04 DIAGNOSIS — Z95828 Presence of other vascular implants and grafts: Secondary | ICD-10-CM

## 2019-05-04 DIAGNOSIS — Z5112 Encounter for antineoplastic immunotherapy: Secondary | ICD-10-CM | POA: Insufficient documentation

## 2019-05-04 DIAGNOSIS — C8338 Diffuse large B-cell lymphoma, lymph nodes of multiple sites: Secondary | ICD-10-CM | POA: Insufficient documentation

## 2019-05-04 DIAGNOSIS — Z5189 Encounter for other specified aftercare: Secondary | ICD-10-CM | POA: Insufficient documentation

## 2019-05-04 DIAGNOSIS — Z5111 Encounter for antineoplastic chemotherapy: Secondary | ICD-10-CM | POA: Insufficient documentation

## 2019-05-04 LAB — SURGICAL PATHOLOGY

## 2019-05-04 NOTE — Progress Notes (Signed)

## 2019-05-04 NOTE — Progress Notes (Signed)
ON PATHWAY REGIMEN - Lymphoma and CLL  No Change  Continue With Treatment as Ordered.     A cycle is every 21 days:     Prednisone      Rituximab-xxxx      Cyclophosphamide      Doxorubicin      Vincristine   **Always confirm dose/schedule in your pharmacy ordering system**  Patient Characteristics: Diffuse Large B-Cell Lymphoma or Follicular Lymphoma, Grade 3B, First Line, Stage III and IV Disease Type: Not Applicable Disease Type: Diffuse Large B-Cell Lymphoma Disease Type: Not Applicable Line of therapy: First Line [place] Stage: III Intent of Therapy: Curative Intent, Discussed with Patient

## 2019-05-04 NOTE — Progress Notes (Addendum)
[facility] CSW Progress Notes  Two calls to patient to complete CSW assessment,unable to reach him, patient currently in preadmission testing per chart. CSW will reschedule to see him at a later date.  Edwyna Shell, LCSW Clinical Social Worker Phone:  [phone] Cell:  [phone]

## 2019-05-04 NOTE — Progress Notes (Signed)
DISCONTINUE ON PATHWAY REGIMEN - Lymphoma and CLL     A cycle is every 21 days:     Prednisone      Rituximab-xxxx      Cyclophosphamide      Doxorubicin      Vincristine   **Always confirm dose/schedule in your pharmacy ordering system**  REASON: Other Reason PRIOR TREATMENT: ERXV400: R(IV)-CHOP q21 Days x 6 Cycles TREATMENT RESPONSE: Unable to Evaluate  START OFF PATHWAY REGIMEN - Lymphoma and CLL   [id number]:DA-EPOCH-R q21 Days:   A cycle is every 21 days:     Prednisone      Rituximab-xxxx      Etoposide      Doxorubicin      Vincristine      Cyclophosphamide      Filgrastim-xxxx   **Always confirm dose/schedule in your pharmacy ordering system**  Patient Characteristics: Diffuse Large B-Cell Lymphoma or Follicular Lymphoma, Grade 3B, First Line, Stage III and IV Disease Type: Not Applicable Disease Type: Diffuse Large B-Cell Lymphoma Disease Type: Not Applicable Line of therapy: First Line [place] Stage: III Intent of Therapy: Curative Intent, Discussed with Patient

## 2019-05-05 ENCOUNTER — Other Ambulatory Visit (HOSPITAL_COMMUNITY)
Admission: RE | Admit: 2019-05-05 | Discharge: 2019-05-05 | Disposition: A | Discharge status: Home / Self Care | Payer: BLUE CROSS/BLUE SHIELD | Source: Ambulatory Visit | Attending: General Surgery | Admitting: General Surgery

## 2019-05-05 ENCOUNTER — Other Ambulatory Visit: Payer: Self-pay

## 2019-05-05 DIAGNOSIS — Z01812 Encounter for preprocedural laboratory examination: Secondary | ICD-10-CM | POA: Insufficient documentation

## 2019-05-05 DIAGNOSIS — Z20828 Contact with and (suspected) exposure to other viral communicable diseases: Secondary | ICD-10-CM | POA: Insufficient documentation

## 2019-05-05 LAB — SARS CORONAVIRUS 2 (TAT 6-24 HRS): SARS Coronavirus 2: NEGATIVE

## 2019-05-07 ENCOUNTER — Ambulatory Visit (HOSPITAL_COMMUNITY)
Admission: RE | Admit: 2019-05-07 | Discharge: 2019-05-07 | Disposition: A | Discharge status: Home / Self Care | Payer: BLUE CROSS/BLUE SHIELD | Attending: General Surgery | Admitting: General Surgery

## 2019-05-07 ENCOUNTER — Ambulatory Visit: Payer: BLUE CROSS/BLUE SHIELD | Admitting: Family Medicine

## 2019-05-07 ENCOUNTER — Ambulatory Visit (HOSPITAL_COMMUNITY): Payer: BLUE CROSS/BLUE SHIELD

## 2019-05-07 ENCOUNTER — Ambulatory Visit (HOSPITAL_COMMUNITY): Payer: BLUE CROSS/BLUE SHIELD | Admitting: Anesthesiology

## 2019-05-07 ENCOUNTER — Encounter (HOSPITAL_COMMUNITY)
Admission: RE | Disposition: A | Discharge status: Home / Self Care | Payer: Self-pay | Source: Home / Self Care | Attending: General Surgery

## 2019-05-07 ENCOUNTER — Encounter (HOSPITAL_COMMUNITY): Payer: Self-pay | Admitting: *Deleted

## 2019-05-07 ENCOUNTER — Other Ambulatory Visit (HOSPITAL_COMMUNITY): Payer: Self-pay

## 2019-05-07 DIAGNOSIS — I10 Essential (primary) hypertension: Secondary | ICD-10-CM | POA: Diagnosis not present

## 2019-05-07 DIAGNOSIS — C8330 Diffuse large B-cell lymphoma, unspecified site: Secondary | ICD-10-CM | POA: Insufficient documentation

## 2019-05-07 DIAGNOSIS — E785 Hyperlipidemia, unspecified: Secondary | ICD-10-CM | POA: Diagnosis not present

## 2019-05-07 DIAGNOSIS — C8338 Diffuse large B-cell lymphoma, lymph nodes of multiple sites: Secondary | ICD-10-CM

## 2019-05-07 DIAGNOSIS — Z806 Family history of leukemia: Secondary | ICD-10-CM | POA: Insufficient documentation

## 2019-05-07 DIAGNOSIS — Z79899 Other long term (current) drug therapy: Secondary | ICD-10-CM | POA: Insufficient documentation

## 2019-05-07 DIAGNOSIS — Z87891 Personal history of nicotine dependence: Secondary | ICD-10-CM | POA: Diagnosis not present

## 2019-05-07 DIAGNOSIS — C8590 Non-Hodgkin lymphoma, unspecified, unspecified site: Secondary | ICD-10-CM | POA: Diagnosis not present

## 2019-05-07 DIAGNOSIS — Z833 Family history of diabetes mellitus: Secondary | ICD-10-CM | POA: Diagnosis not present

## 2019-05-07 DIAGNOSIS — Z9981 Dependence on supplemental oxygen: Secondary | ICD-10-CM | POA: Insufficient documentation

## 2019-05-07 DIAGNOSIS — Z452 Encounter for adjustment and management of vascular access device: Secondary | ICD-10-CM | POA: Diagnosis not present

## 2019-05-07 DIAGNOSIS — I272 Pulmonary hypertension, unspecified: Secondary | ICD-10-CM | POA: Insufficient documentation

## 2019-05-07 DIAGNOSIS — J90 Pleural effusion, not elsewhere classified: Secondary | ICD-10-CM | POA: Insufficient documentation

## 2019-05-07 DIAGNOSIS — Z86718 Personal history of other venous thrombosis and embolism: Secondary | ICD-10-CM | POA: Diagnosis not present

## 2019-05-07 DIAGNOSIS — Z7901 Long term (current) use of anticoagulants: Secondary | ICD-10-CM | POA: Insufficient documentation

## 2019-05-07 DIAGNOSIS — Z95828 Presence of other vascular implants and grafts: Secondary | ICD-10-CM

## 2019-05-07 DIAGNOSIS — J9811 Atelectasis: Secondary | ICD-10-CM | POA: Diagnosis not present

## 2019-05-07 DIAGNOSIS — J9601 Acute respiratory failure with hypoxia: Secondary | ICD-10-CM | POA: Diagnosis not present

## 2019-05-07 HISTORY — PX: PORTACATH PLACEMENT: SHX2246

## 2019-05-07 SURGERY — INSERTION, TUNNELED CENTRAL VENOUS DEVICE, WITH PORT
Anesthesia: Monitor Anesthesia Care | Site: Chest | Laterality: Left

## 2019-05-07 MED ORDER — PROMETHAZINE HCL 25 MG/ML IJ SOLN: 6.2500 mg | INTRAMUSCULAR | Status: DC | PRN

## 2019-05-07 MED ORDER — FENTANYL CITRATE (PF) 100 MCG/2ML IJ SOLN
INTRAMUSCULAR | Status: DC | PRN
  Administered 2019-05-07 (×4): 25 ug via INTRAVENOUS

## 2019-05-07 MED ORDER — CHLORHEXIDINE GLUCONATE CLOTH 2 % EX PADS: 6.0000 | MEDICATED_PAD | Freq: Once | CUTANEOUS | Status: DC

## 2019-05-07 MED ORDER — PROPOFOL 10 MG/ML IV BOLUS
INTRAVENOUS | Status: AC
  Filled 2019-05-07: qty 20

## 2019-05-07 MED ORDER — LIDOCAINE HCL (CARDIAC) PF 100 MG/5ML IV SOSY
PREFILLED_SYRINGE | INTRAVENOUS | Status: DC | PRN
  Administered 2019-05-07: 40 mg via INTRAVENOUS

## 2019-05-07 MED ORDER — CEFAZOLIN SODIUM-DEXTROSE 2-4 GM/100ML-% IV SOLN
2.0000 g | INTRAVENOUS | Status: AC
  Administered 2019-05-07: 2 g via INTRAVENOUS
  Filled 2019-05-07: qty 100

## 2019-05-07 MED ORDER — HEPARIN SOD (PORK) LOCK FLUSH 100 UNIT/ML IV SOLN
INTRAVENOUS | Status: DC | PRN
  Administered 2019-05-07: 500 [IU] via INTRAVENOUS

## 2019-05-07 MED ORDER — FENTANYL CITRATE (PF) 100 MCG/2ML IJ SOLN
INTRAMUSCULAR | Status: AC
  Filled 2019-05-07: qty 2

## 2019-05-07 MED ORDER — MIDAZOLAM HCL 2 MG/2ML IJ SOLN: 0.5000 mg | Freq: Once | INTRAMUSCULAR | Status: DC | PRN

## 2019-05-07 MED ORDER — SODIUM CHLORIDE (PF) 0.9 % IJ SOLN
INTRAMUSCULAR | Status: DC | PRN
  Administered 2019-05-07: 500 mL via INTRAVENOUS

## 2019-05-07 MED ORDER — APIXABAN 5 MG PO TABS: 5.0000 mg | ORAL_TABLET | Freq: Two times a day (BID) | ORAL | 3 refills | Status: DC

## 2019-05-07 MED ORDER — HYDROCODONE-ACETAMINOPHEN 7.5-325 MG PO TABS: 1.0000 | ORAL_TABLET | Freq: Once | ORAL | Status: DC | PRN

## 2019-05-07 MED ORDER — MIDAZOLAM HCL 5 MG/5ML IJ SOLN
INTRAMUSCULAR | Status: DC | PRN
  Administered 2019-05-07 (×2): 1 mg via INTRAVENOUS

## 2019-05-07 MED ORDER — LACTATED RINGERS IV SOLN
INTRAVENOUS | Status: DC
  Administered 2019-05-07: 09:00:00 1000 mL via INTRAVENOUS

## 2019-05-07 MED ORDER — LIDOCAINE HCL (PF) 1 % IJ SOLN
INTRAMUSCULAR | Status: AC
  Filled 2019-05-07: qty 30

## 2019-05-07 MED ORDER — GLYCOPYRROLATE 0.2 MG/ML IJ SOLN
INTRAMUSCULAR | Status: DC | PRN
  Administered 2019-05-07: 0.2 mg via INTRAVENOUS

## 2019-05-07 MED ORDER — PROPOFOL 500 MG/50ML IV EMUL
INTRAVENOUS | Status: DC | PRN
  Administered 2019-05-07: 10:00:00 via INTRAVENOUS
  Administered 2019-05-07: 75 ug/kg/min via INTRAVENOUS

## 2019-05-07 MED ORDER — HEPARIN SOD (PORK) LOCK FLUSH 100 UNIT/ML IV SOLN
INTRAVENOUS | Status: AC
  Filled 2019-05-07: qty 5

## 2019-05-07 MED ORDER — LIDOCAINE HCL (PF) 1 % IJ SOLN
INTRAMUSCULAR | Status: DC | PRN
  Administered 2019-05-07: 16 mL

## 2019-05-07 MED ORDER — HYDROMORPHONE HCL 1 MG/ML IJ SOLN: 0.2500 mg | INTRAMUSCULAR | Status: DC | PRN

## 2019-05-07 MED ORDER — MIDAZOLAM HCL 2 MG/2ML IJ SOLN
INTRAMUSCULAR | Status: AC
  Filled 2019-05-07: qty 2

## 2019-05-07 SURGICAL SUPPLY — 36 items
ADH SKN CLS APL DERMABOND .7 (GAUZE/BANDAGES/DRESSINGS) ×1
APL PRP STRL LF ISPRP CHG 10.5 (MISCELLANEOUS) ×1
APPLICATOR CHLORAPREP 10.5 ORG (MISCELLANEOUS) ×2 IMPLANT
BAG DECANTER FOR FLEXI CONT (MISCELLANEOUS) ×2 IMPLANT
CLOTH BEACON ORANGE TIMEOUT ST (SAFETY) ×2 IMPLANT
COVER LIGHT HANDLE STERIS (MISCELLANEOUS) ×4 IMPLANT
COVER WAND RF STERILE (DRAPES) ×2 IMPLANT
DECANTER SPIKE VIAL GLASS SM (MISCELLANEOUS) ×2 IMPLANT
DERMABOND ADVANCED (GAUZE/BANDAGES/DRESSINGS) ×1
DERMABOND ADVANCED .7 DNX12 (GAUZE/BANDAGES/DRESSINGS) ×1 IMPLANT
DRAPE C-ARM FOLDED MOBILE STRL (DRAPES) ×2 IMPLANT
ELECT REM PT RETURN 9FT ADLT (ELECTROSURGICAL) ×2
ELECTRODE REM PT RTRN 9FT ADLT (ELECTROSURGICAL) ×1 IMPLANT
GLOVE BIO SURGEON STRL SZ 6.5 (GLOVE) ×2 IMPLANT
GLOVE BIO SURGEON STRL SZ7 (GLOVE) ×1 IMPLANT
GLOVE BIOGEL PI IND STRL 6.5 (GLOVE) ×1 IMPLANT
GLOVE BIOGEL PI IND STRL 7.0 (GLOVE) ×1 IMPLANT
GLOVE BIOGEL PI INDICATOR 6.5 (GLOVE) ×1
GLOVE BIOGEL PI INDICATOR 7.0 (GLOVE) ×2
GOWN STRL REUS W/TWL LRG LVL3 (GOWN DISPOSABLE) ×4 IMPLANT
IV NS 500ML (IV SOLUTION) ×2
IV NS 500ML BAXH (IV SOLUTION) ×1 IMPLANT
KIT PORT POWER 8FR ISP MRI (Port) ×2 IMPLANT
KIT TURNOVER KIT A (KITS) ×2 IMPLANT
MANIFOLD NEPTUNE II (INSTRUMENTS) ×2 IMPLANT
NDL HYPO 25X1 1.5 SAFETY (NEEDLE) ×1 IMPLANT
NEEDLE HYPO 25X1 1.5 SAFETY (NEEDLE) ×2 IMPLANT
PACK MINOR (CUSTOM PROCEDURE TRAY) ×2 IMPLANT
PAD ARMBOARD 7.5X6 YLW CONV (MISCELLANEOUS) ×2 IMPLANT
SET BASIN LINEN APH (SET/KITS/TRAYS/PACK) ×2 IMPLANT
SUT MNCRL AB 4-0 PS2 18 (SUTURE) ×2 IMPLANT
SUT PROLENE 2 0 SH 30 (SUTURE) ×2 IMPLANT
SUT VIC AB 3-0 SH 27 (SUTURE) ×2
SUT VIC AB 3-0 SH 27X BRD (SUTURE) ×1 IMPLANT
SYR 10ML LL (SYRINGE) ×4 IMPLANT
SYR CONTROL 10ML LL (SYRINGE) ×2 IMPLANT

## 2019-05-07 NOTE — Discharge Instructions (Signed)
Keep area clean and dry. You can take a shower in 24 hours. Do not submerge in water.  Take tylenol and ibuprofen for pain control and your already prescribed narcotic for severe pain. YOU can take the last Lovenox shot at 4:30pm (6 hours after surgery). Start the Eliquis back Saturday 05/08/19.   Implanted Port Insertion, Care After This sheet gives you information about how to care for yourself after your procedure. Your health care provider may also give you more specific instructions. If you have problems or questions, contact your health care provider. What can I expect after the procedure? After the procedure, it is common to have:  Discomfort at the port insertion site.  Bruising on the skin over the port. This should improve over 3-4 days. Follow these instructions at home: [facility] care  After your port is placed, you will get a manufacturer's information card. The card has information about your port. Keep this card with you at all times.  Take care of the port as told by your health care provider. Ask your health care provider if you or a family member can get training for taking care of the port at home. A home health care nurse may also take care of the port.  Make sure to remember what type of port you have. Incision care      Follow instructions from your health care provider about how to take care of your port insertion site. Make sure you: ? Wash your hands with soap and water before and after you change your bandage (dressing). If soap and water are not available, use hand sanitizer. ? Change your dressing as told by your health care provider. ? Leave stitches (sutures), skin glue, or adhesive strips in place. These skin closures may need to stay in place for 2 weeks or longer. If adhesive strip edges start to loosen and curl up, you may trim the loose edges. Do not remove adhesive strips completely unless your health care provider tells you to do that.  Check your port  insertion site every day for signs of infection. Check for: ? Redness, swelling, or pain. ? Fluid or blood. ? Warmth. ? Pus or a bad smell. Activity  Return to your normal activities as told by your health care provider. Ask your health care provider what activities are safe for you.  Do not lift anything that is heavier than 10 lb (4.5 kg), or the limit that you are told, until your health care provider says that it is safe. General instructions  Take over-the-counter and prescription medicines only as told by your health care provider.  Do not take baths, swim, or use a hot tub until your health care provider approves. Ask your health care provider if you may take showers. You may only be allowed to take sponge baths.  Do not drive for 24 hours if you were given a sedative during your procedure.  Wear a medical alert bracelet in case of an emergency. This will tell any health care providers that you have a port.  Keep all follow-up visits as told by your health care provider. This is important. Contact a health care provider if:  You cannot flush your port with saline as directed, or you cannot draw blood from the port.  You have a fever or chills.  You have redness, swelling, or pain around your port insertion site.  You have fluid or blood coming from your port insertion site.  Your port insertion site feels  warm to the touch.  You have pus or a bad smell coming from the port insertion site. Get help right away if:  You have chest pain or shortness of breath.  You have bleeding from your port that you cannot control. Summary  Take care of the port as told by your health care provider. Keep the manufacturer's information card with you at all times.  Change your dressing as told by your health care provider.  Contact a health care provider if you have a fever or chills or if you have redness, swelling, or pain around your port insertion site.  Keep all follow-up  visits as told by your health care provider. This information is not intended to replace advice given to you by your health care provider. Make sure you discuss any questions you have with your health care provider. Document Released: 04/07/2013 Document Revised: 01/13/2018 Document Reviewed: 01/13/2018 Elsevier Patient Education  [place].

## 2019-05-07 NOTE — Progress Notes (Signed)
[facility]  Talked to wife, and told her procedure completed. CXR pending. Take last lovenox shot today at 4:30pm and restart Eliquis tomorrow. Followup as needed.   Curlene Labrum, MD [facility] [address] [place], [place] [address] [phone]/ [phone] (office)

## 2019-05-07 NOTE — Anesthesia Procedure Notes (Signed)
Procedure Name: [place] Performed by: Andree Elk Dilyn Osoria A, CRNA Pre-anesthesia Checklist: Patient identified, Emergency Drugs available, Suction available and Patient being monitored Oxygen Delivery Method: Non-rebreather mask

## 2019-05-07 NOTE — Op Note (Signed)
Operative Note 05/07/19   Preoperative Diagnosis:  Lymphoma    Postoperative Diagnosis: Same   Procedure(s) Performed: Port-A-Cath placement, left subclavian    Surgeon: Lanell Matar. Constance Haw, MD   Assistants: No qualified resident was available   Anesthesia: Monitored anesthesia care   Anesthesiologist: Lenice Llamas, MD    Specimens: None   Estimated Blood Loss: Minimal    Blood Replacement: None    Complications: None    Operative Findings: Normal anatomy  Indications: Kevin [name] is a 63 yo with a newly diagnosed lymphoma who needs a port a catheter for treatment. He has a history of new DVT and is on Eliquis, and has stopped this for two days prior to Lovenox shots to cover him. We discussed the risk of bleeding, infection, injury to vessels, malfunction, pneumothorax, and he opted to proceed.   Procedure: The patient was brought into the operating room and was induced.  One percent lidocaine was used for local anesthesia.   The left chest and neck was prepped and draped in the usual sterile fashion.  Preoperative antibiotics were given.  An incision was made below the left clavicle. A subcutaneous pocket was formed. The needles advanced into the left subclavian vein using the Seldinger technique without difficulty. A guidewire was then advanced into the right atrium under fluoroscopic guidance.  Ectopia was not noted. An introducer and peel-away sheath were placed over the guidewire. The catheter was then inserted through the peel-away sheath and the peel-away sheath was removed.  A spot film was performed to confirm the position. The catheter was then attached to the port and the port placed in subcutaneous pocket. Adequate positioning was confirmed by fluoroscopy. Hemostasis was confirmed, and the port was secured with 2-0 prolene sutures.  Good backflow of blood was noted on aspiration of the port. The port was flushed with heparin flush. Subcutaneous layer was reapproximated  using a 3-0 Vicryl interrupted suture. The skin was closed using a 4-0 Vicryl subcuticular suture. Dermabond was applied.  All tape and needle counts were correct at the end of the procedure. The patient was transferred to PACU in stable condition. A chest x-ray will be performed at that time.  Curlene Labrum, MD [facility] [address] [place], [place] [address] [phone] (office)

## 2019-05-07 NOTE — Transfer of Care (Signed)
Immediate Anesthesia Transfer of Care Note  Patient: CALDEN DORSEY  Procedure(s) Performed: INSERTION PORT-A-CATH (catheter left subclavian) (Left Chest)  Patient Location: PACU  Anesthesia Type:MAC  Level of Consciousness: awake, alert , oriented and patient cooperative  Airway & Oxygen Therapy: Patient Spontanous Breathing  Post-op Assessment: Report given to RN and Post -op Vital signs reviewed and stable  Post vital signs: Reviewed and stable  Last Vitals:  Vitals Value Taken Time  BP 158/75 05/07/19 1030  Temp    Pulse 64 05/07/19 1031  Resp 17 05/07/19 1031  SpO2 99 % 05/07/19 1031  Vitals shown include unvalidated device data.  Last Pain:  Vitals:   05/07/19 0856  TempSrc: Oral  PainSc: 0-No pain      Patients Stated Pain Goal: 7 (48/88/91 6945)  Complications: No apparent anesthesia complications

## 2019-05-07 NOTE — Anesthesia Preprocedure Evaluation (Signed)
Anesthesia Evaluation  Patient identified by MRN, date of birth, ID band Patient awake    Reviewed: Allergy & Precautions, NPO status , Patient's Chart, lab work & pertinent test results  Airway Mallampati: II  TM Distance: >3 FB Neck ROM: Full    Dental no notable dental hx. (+) Poor Dentition, Missing, Chipped   Pulmonary neg pulmonary ROS, former smoker,    Pulmonary exam normal breath sounds clear to auscultation       Cardiovascular Exercise Tolerance: Good hypertension, Pt. on medications and Pt. on home beta blockers negative cardio ROS Normal cardiovascular examI Rhythm:Regular Rate:Normal  Known pulm HTN Wears o2 at night  Reports good ET Last EF normal    Neuro/Psych negative neurological ROS  negative psych ROS   GI/Hepatic negative GI ROS, Neg liver ROS,   Endo/Other  negative endocrine ROS  Renal/GU Renal diseaseRecently stented for hydronephrosis  negative genitourinary   Musculoskeletal negative musculoskeletal ROS (+)   Abdominal   Peds negative pediatric ROS (+)  Hematology negative hematology ROS (+)   Anesthesia Other Findings Here for port for lymphoma   Reproductive/Obstetrics negative OB ROS                             Anesthesia Physical Anesthesia Plan  ASA: III  Anesthesia Plan: MAC   Post-op Pain Management:    Induction: Intravenous  PONV Risk Score and Plan: 1 and Propofol infusion, TIVA and Treatment may vary due to age or medical condition  Airway Management Planned: Nasal Cannula and Simple Face Mask  Additional Equipment:   Intra-op Plan:   Post-operative Plan:   Informed Consent: I have reviewed the patients History and Physical, chart, labs and discussed the procedure including the risks, benefits and alternatives for the proposed anesthesia with the patient or authorized representative who has indicated his/her understanding and  acceptance.     Dental advisory given  Plan Discussed with: CRNA  Anesthesia Plan Comments: (Plan Full PPE use  Plan MAC as tolerated,  GETA as needed d/w pt -WTP with same after Q&A)        Anesthesia Quick Evaluation

## 2019-05-07 NOTE — H&P (Signed)
[facility] History and Physical  Reason for Referral: Port a catheter  Referring Physician:  Dr. Charlton Amor is a 63 y.o. male.  HPI: Mr. Weider is a 63 yo with newly diagnosed lymphoma who is going to start treatment. He is doing well overall and says that he has not had to use any of his oxygen at home. His O2 has only briefly dropped to less than 90%. He says that he has been healing from his right groin incision from the lymph node biopsy.  He expresses no changes to his health since I saw him last. Due to his DVT history associated with his cancer, he is on eliquis, and I stopped this and prescribed therapeutic lovenox daily for Wednesday and Thursday leading up to the port a catheter placement. He has taken these shots as prescribed.   Past Medical History:  Diagnosis Date  . Diffuse large B cell lymphoma ([place]) 04/26/2019  . Hyperlipidemia 02/01/2014  . Hypertension   . Leg DVT (deep venous thromboembolism), acute, bilateral ([place]) 04/18/2019  . Port-A-Cath in place 04/27/2019    Past Surgical History:  Procedure Laterality Date  . CYSTOSCOPY W/ URETERAL STENT PLACEMENT Bilateral 04/21/2019   Procedure: CYSTOSCOPY WITH RETROGRADE PYELOGRAM/URETERAL STENT PLACEMENT;  Surgeon: Cleon Gustin, MD;  Location: [facility];  Service: Urology;  Laterality: Bilateral;  . INGUINAL LYMPH NODE BIOPSY Right 04/21/2019   Procedure: INGUINAL LYMPH NODE BIOPSY;  Surgeon: Virl Cagey, MD;  Location: [facility];  Service: General;  Laterality: Right;    Family History  Problem Relation Age of Onset  . Diabetes Brother   . Diabetes Mother   . Leukemia Brother   . Diabetes Son     Social History   Tobacco Use  . Smoking status: Former Smoker    Quit date: 04/27/1999    Years since quitting: 20.0  . Smokeless tobacco: Never Used  Substance Use Topics  . Alcohol use: Yes    Alcohol/week: 56.0 standard drinks    Types: 56 Cans of beer per week  .  Drug use: Never    Medications:  I have reviewed the patient's current medications. Prior to Admission:  Medications Prior to Admission  Medication Sig Dispense Refill Last Dose  . amLODipine (NORVASC) 10 MG tablet Take 1 tablet (10 mg total) by mouth daily. 90 tablet 1 05/07/2019 at 0630  . apixaban (ELIQUIS) 5 MG TABS tablet Take 1 tablet (5 mg total) by mouth 2 (two) times daily. 60 tablet 3 05/04/2019  . enoxaparin (LOVENOX) 150 MG/ML injection Inject 0.92 mLs (140 mg total) into the skin daily. Take lovenox shot once daily in the AM on Wednesday 11/4 and Thursday 11/5 prior to your surgery. 3 mL 0 05/06/2019 at Unknown time  . HYDROcodone-acetaminophen (NORCO/VICODIN) 5-325 MG tablet Take 1 tablet by mouth every 8 (eight) hours as needed for moderate pain.    05/06/2019 at Unknown time  . losartan (COZAAR) 100 MG tablet Take 1 tablet (100 mg total) by mouth daily. 90 tablet 1 05/07/2019 at Unknown time  . metoprolol succinate (TOPROL-XL) 50 MG 24 hr tablet Take 1 tablet (50 mg total) by mouth daily. 90 tablet 1 05/07/2019 at Unknown time  . [START ON 05/11/2019] RITUXIMAB IV Inject into the vein every 21 ( twenty-one) days.   05/06/2019 at Unknown time  . [START ON 05/11/2019] CYCLOPHOSPHAMIDE IV Inject into the vein every 21 ( twenty-one) days.   Unknown at Unknown time  . [  START ON 05/11/2019] DOXORUBICIN HCL IV Inject into the vein every 21 ( twenty-one) days.   Unknown at Unknown time  . OXYGEN Inhale 2 L into the lungs 3 (three) times daily as needed (oxygen level below 93).    Unknown at Unknown time  . traMADol (ULTRAM) 50 MG tablet Take 1 tablet (50 mg total) by mouth every 6 (six) hours as needed for moderate pain or severe pain. (Patient not taking: Reported on 05/03/2019) 15 tablet 0 Not Taking at Unknown time  . [START ON 05/11/2019] vinCRIStine 2 mg in sodium chloride 0.9 % 50 mL Inject 2 mg into the vein every 21 ( twenty-one) days.   Unknown at Unknown time   Scheduled: .  Chlorhexidine Gluconate Cloth  6 each Topical Once   And  . Chlorhexidine Gluconate Cloth  6 each Topical Once   Continuous: .  ceFAZolin (ANCEF) IV    . lactated ringers 1,000 mL (05/07/19 0905)   No Known Allergies   ROS:  A comprehensive review of systems was negative except for: healing right groin incision  Blood pressure (!) 143/67, temperature 98.5 F (36.9 C), temperature source Oral, resp. rate 18, SpO2 94 %. Physical Exam Vitals signs reviewed.  Constitutional:      Appearance: Normal appearance.  HENT:     Head: Normocephalic.     Right Ear: Tympanic membrane normal.     Nose: Nose normal.     Mouth/Throat:     Mouth: Mucous membranes are moist.  Eyes:     Extraocular Movements: Extraocular movements intact.     Pupils: Pupils are equal, round, and reactive to light.  Neck:     Musculoskeletal: Normal range of motion. No neck rigidity.  Cardiovascular:     Rate and Rhythm: Normal rate and regular rhythm.  Pulmonary:     Effort: Pulmonary effort is normal.     Breath sounds: Normal breath sounds.  Abdominal:     General: There is no distension.     Palpations: Abdomen is soft.     Tenderness: There is no abdominal tenderness.  Genitourinary:    Comments: Right inguinal incisions c/d/i with dermabond peeling, no swelling or bruising, no erythema or drainage Musculoskeletal: Normal range of motion.        General: No swelling.  Skin:    General: Skin is warm and dry.  Neurological:     General: No focal deficit present.     Mental Status: He is alert and oriented to person, place, and time.  Psychiatric:        Mood and Affect: Mood normal.        Behavior: Behavior normal.        Thought Content: Thought content normal.        Judgment: Judgment normal.      Assessment & Plan:  NOELLE HOOGLAND is a 63 y.o. male with lymphoma who is going to start treatment. We are proceeding with port placement.  -Port a catheter placement, left side as he is right  handed  -Discussed risk of bleeding, infection, malfunction, injury to artery, pneumothorax, and he has opted to proceed.  -Lovenox shots for the last two days were given   All questions were answered to the satisfaction of the patient.   Virl Cagey 05/07/2019, 9:26 AM

## 2019-05-07 NOTE — Anesthesia Postprocedure Evaluation (Signed)
Anesthesia Post Note LAte entry for 1045 Patient: Kevin [name]  Procedure(s) Performed: INSERTION PORT-A-CATH (catheter left subclavian) (Left Chest)  Patient location during evaluation: PACU Anesthesia Type: MAC Level of consciousness: awake and alert, oriented and patient cooperative Pain management: pain level controlled Vital Signs Assessment: post-procedure vital signs reviewed and stable Respiratory status: spontaneous breathing Cardiovascular status: stable Postop Assessment: no apparent nausea or vomiting Anesthetic complications: no     Last Vitals:  Vitals:   05/07/19 1055 05/07/19 1100  BP:  (!) 160/69  Pulse: 62 65  Resp: (!) 26 (!) 27  Temp:    SpO2: 98% 97%    Last Pain:  Vitals:   05/07/19 1100  TempSrc:   PainSc: 2                  Tiane Szydlowski A

## 2019-05-10 ENCOUNTER — Other Ambulatory Visit: Payer: Self-pay

## 2019-05-10 ENCOUNTER — Inpatient Hospital Stay (HOSPITAL_COMMUNITY): Payer: BLUE CROSS/BLUE SHIELD

## 2019-05-10 ENCOUNTER — Encounter (HOSPITAL_COMMUNITY): Payer: Self-pay | Admitting: General Surgery

## 2019-05-10 ENCOUNTER — Encounter: Payer: Self-pay | Admitting: Hematology

## 2019-05-10 ENCOUNTER — Inpatient Hospital Stay (HOSPITAL_BASED_OUTPATIENT_CLINIC_OR_DEPARTMENT_OTHER): Payer: BLUE CROSS/BLUE SHIELD | Admitting: Hematology

## 2019-05-10 VITALS — BP 163/61 | HR 88 | Temp 97.8°F | Resp 18 | Wt 207.6 lb

## 2019-05-10 VITALS — BP 126/59 | HR 74 | Temp 98.1°F | Resp 18

## 2019-05-10 DIAGNOSIS — C8338 Diffuse large B-cell lymphoma, lymph nodes of multiple sites: Secondary | ICD-10-CM

## 2019-05-10 DIAGNOSIS — Z95828 Presence of other vascular implants and grafts: Secondary | ICD-10-CM

## 2019-05-10 DIAGNOSIS — Z5189 Encounter for other specified aftercare: Secondary | ICD-10-CM | POA: Diagnosis not present

## 2019-05-10 DIAGNOSIS — Z5111 Encounter for antineoplastic chemotherapy: Secondary | ICD-10-CM | POA: Diagnosis not present

## 2019-05-10 DIAGNOSIS — Z5112 Encounter for antineoplastic immunotherapy: Secondary | ICD-10-CM | POA: Diagnosis not present

## 2019-05-10 LAB — COMPREHENSIVE METABOLIC PANEL
ALT: 16 U/L (ref 0–44)
AST: 28 U/L (ref 15–41)
Albumin: 2.9 g/dL — ABNORMAL LOW (ref 3.5–5.0)
Alkaline Phosphatase: 68 U/L (ref 38–126)
Anion gap: 10 (ref 5–15)
BUN: 25 mg/dL — ABNORMAL HIGH (ref 8–23)
CO2: 25 mmol/L (ref 22–32)
Calcium: 9.1 mg/dL (ref 8.9–10.3)
Chloride: 102 mmol/L (ref 98–111)
Creatinine, Ser: 1.39 mg/dL — ABNORMAL HIGH (ref 0.61–1.24)
GFR calc Af Amer: >60 mL/min (ref >60)
GFR calc non Af Amer: 53 mL/min — ABNORMAL LOW (ref >60)
Glucose, Bld: 111 mg/dL — ABNORMAL HIGH (ref 70–99)
Potassium: 3.8 mmol/L (ref 3.5–5.1)
Sodium: 137 mmol/L (ref 135–145)
Total Bilirubin: 0.8 mg/dL (ref 0.3–1.2)
Total Protein: 6 g/dL — ABNORMAL LOW (ref 6.5–8.1)

## 2019-05-10 LAB — CBC WITH DIFFERENTIAL/PLATELET
Abs Immature Granulocytes: 0.13 10*3/uL — ABNORMAL HIGH (ref 0.00–0.07)
Basophils Absolute: 0 10*3/uL (ref 0.0–0.1)
Basophils Relative: 0 %
Eosinophils Absolute: 0.9 10*3/uL — ABNORMAL HIGH (ref 0.0–0.5)
Eosinophils Relative: 9 %
HCT: 36.8 % — ABNORMAL LOW (ref 39.0–52.0)
Hemoglobin: 12.1 g/dL — ABNORMAL LOW (ref 13.0–17.0)
Immature Granulocytes: 1 %
Lymphocytes Relative: 11 %
Lymphs Abs: 1.1 10*3/uL (ref 0.7–4.0)
MCH: 30.2 pg (ref 26.0–34.0)
MCHC: 32.9 g/dL (ref 30.0–36.0)
MCV: 91.8 fL (ref 80.0–100.0)
Monocytes Absolute: 1 10*3/uL (ref 0.1–1.0)
Monocytes Relative: 11 %
Neutro Abs: 6.3 10*3/uL (ref 1.7–7.7)
Neutrophils Relative %: 68 %
Platelets: 214 10*3/uL (ref 150–400)
RBC: 4.01 MIL/uL — ABNORMAL LOW (ref 4.22–5.81)
RDW: 12.3 % (ref 11.5–15.5)
WBC: 9.4 10*3/uL (ref 4.0–10.5)
nRBC: 0 % (ref 0.0–0.2)

## 2019-05-10 LAB — PHOSPHORUS: Phosphorus: 2.9 mg/dL (ref 2.5–4.6)

## 2019-05-10 LAB — URIC ACID: Uric Acid, Serum: 3.8 mg/dL (ref 3.7–8.6)

## 2019-05-10 MED ORDER — SODIUM CHLORIDE 0.9 % IV SOLN
INTRAVENOUS | Status: DC
  Administered 2019-05-10 (×4): via INTRAVENOUS

## 2019-05-10 MED ORDER — DIPHENHYDRAMINE HCL 50 MG/ML IJ SOLN
50.0000 mg | Freq: Once | INTRAMUSCULAR | Status: AC
  Administered 2019-05-10: 50 mg via INTRAVENOUS
  Filled 2019-05-10: qty 1

## 2019-05-10 MED ORDER — ALLOPURINOL 300 MG PO TABS: 300.0000 mg | ORAL_TABLET | Freq: Every day | ORAL | 3 refills | Status: DC

## 2019-05-10 MED ORDER — MORPHINE SULFATE 2 MG/ML IJ SOLN
2.0000 mg | Freq: Once | INTRAMUSCULAR | Status: DC
  Filled 2019-05-10: qty 1

## 2019-05-10 MED ORDER — METHYLPREDNISOLONE SODIUM SUCC 125 MG IJ SOLR
125.0000 mg | Freq: Once | INTRAMUSCULAR | Status: AC
  Administered 2019-05-10: 125 mg via INTRAVENOUS

## 2019-05-10 MED ORDER — VINCRISTINE SULFATE CHEMO INJECTION 1 MG/ML
Freq: Once | INTRAVENOUS | Status: DC
  Filled 2019-05-10: qty 10

## 2019-05-10 MED ORDER — SODIUM CHLORIDE 0.9 % IV SOLN
375.0000 mg/m2 | Freq: Once | INTRAVENOUS | Status: AC
  Administered 2019-05-10: 800 mg via INTRAVENOUS
  Filled 2019-05-10: qty 30

## 2019-05-10 MED ORDER — FAMOTIDINE IN NACL 20-0.9 MG/50ML-% IV SOLN
20.0000 mg | Freq: Once | INTRAVENOUS | Status: AC
  Administered 2019-05-10: 20 mg via INTRAVENOUS
  Filled 2019-05-10: qty 50

## 2019-05-10 MED ORDER — HEPARIN SOD (PORK) LOCK FLUSH 100 UNIT/ML IV SOLN
500.0000 [IU] | Freq: Once | INTRAVENOUS | Status: AC
  Administered 2019-05-10: 500 [IU] via INTRAVENOUS

## 2019-05-10 MED ORDER — MORPHINE SULFATE (PF) 2 MG/ML IV SOLN
2.0000 mg | Freq: Once | INTRAVENOUS | Status: AC
  Administered 2019-05-10: 2 mg via INTRAVENOUS
  Filled 2019-05-10: qty 1

## 2019-05-10 MED ORDER — MORPHINE SULFATE (PF) 2 MG/ML IV SOLN
INTRAVENOUS | Status: AC
  Filled 2019-05-10: qty 1

## 2019-05-10 MED ORDER — MORPHINE SULFATE 2 MG/ML IJ SOLN: 2.0000 mg | Freq: Once | INTRAMUSCULAR | Status: DC

## 2019-05-10 MED ORDER — MORPHINE SULFATE (PF) 2 MG/ML IV SOLN
2.0000 mg | Freq: Once | INTRAVENOUS | Status: AC
  Administered 2019-05-10: 2 mg via INTRAVENOUS

## 2019-05-10 MED ORDER — PREDNISONE 50 MG PO TABS: 60.0000 mg/m2/d | ORAL_TABLET | Freq: Two times a day (BID) | ORAL | Status: DC

## 2019-05-10 MED ORDER — SODIUM CHLORIDE 0.9% FLUSH
10.0000 mL | INTRAVENOUS | Status: DC | PRN
  Administered 2019-05-10: 10 mL
  Filled 2019-05-10: qty 10

## 2019-05-10 MED ORDER — ACETAMINOPHEN 325 MG PO TABS
650.0000 mg | ORAL_TABLET | Freq: Once | ORAL | Status: AC
  Administered 2019-05-10: 650 mg via ORAL
  Filled 2019-05-10: qty 2

## 2019-05-10 MED ORDER — SODIUM CHLORIDE 0.9 % IV SOLN
Freq: Once | INTRAVENOUS | Status: AC
  Administered 2019-05-10: 8 mg via INTRAVENOUS
  Filled 2019-05-10: qty 4

## 2019-05-10 MED ORDER — MORPHINE SULFATE 2 MG/ML IJ SOLN
2.0000 mg | Freq: Once | INTRAMUSCULAR | Status: AC
  Administered 2019-05-10: 2 mg via INTRAVENOUS
  Filled 2019-05-10: qty 1

## 2019-05-10 MED ORDER — METHYLPREDNISOLONE SODIUM SUCC 125 MG IJ SOLR
INTRAMUSCULAR | Status: AC
  Filled 2019-05-10: qty 2

## 2019-05-10 NOTE — Patient Instructions (Signed)
[place] at [facility] Discharge Instructions  Labs drawn from portacath today. Follow-up as scheduled. Call clinic for any questions or concerns   Thank you for choosing [place] at [facility] to provide your oncology and hematology care.  To afford each patient quality time with our provider, please arrive at least 15 minutes before your scheduled appointment time.   If you have a lab appointment with the [facility] please come in thru the Main Entrance and check in at the main information desk.  You need to re-schedule your appointment should you arrive 10 or more minutes late.  We strive to give you quality time with our providers, and arriving late affects you and other patients whose appointments are after yours.  Also, if you no show three or more times for appointments you may be dismissed from the clinic at the providers discretion.     Again, thank you for choosing [facility].  Our hope is that these requests will decrease the amount of time that you wait before being seen by our physicians.       _____________________________________________________________  Should you have questions after your visit to [facility], please contact our office at [phone] between the hours of 8:00 a.m. and 4:30 p.m.  Voicemails left after 4:00 p.m. will not be returned until the following business day.  For prescription refill requests, have your pharmacy contact our office and allow 72 hours.    Due to Covid, you will need to wear a mask upon entering the hospital. If you do not have a mask, a mask will be given to you at the Main Entrance upon arrival. For doctor visits, patients may have 1 support person with them. For treatment visits, patients can not have anyone with them due to social distancing guidelines and our immunocompromised population.

## 2019-05-10 NOTE — Progress Notes (Signed)
Kevin [name], [place] [address]   CLINIC:  Medical Oncology/Hematology  PCP:  Loman [place], [address] [address] [phone]   REASON FOR VISIT:  Follow-up for high-grade lymphoma.  CURRENT THERAPY: Dose adjusted R-EPOCH  BRIEF ONCOLOGIC HISTORY:  Oncology History  Diffuse large B-cell lymphoma of lymph nodes of multiple regions ([place])  04/26/2019 Initial Diagnosis   Diffuse large B-cell lymphoma of lymph nodes of multiple regions ([place])   05/10/2019 -  Chemotherapy   The patient had pegfilgrastim (NEULASTA ONPRO KIT) injection 6 mg, 6 mg, Subcutaneous, Once, 1 of 4 cycles DOXOrubicin (ADRIAMYCIN) 20 mg, etoposide (VEPESID) 104 mg, vinCRIStine (ONCOVIN) 0.8 mg in sodium chloride 0.9 % 500 mL chemo infusion, , Intravenous, Once, 1 of 4 cycles Administration:  (05/11/2019),  (05/12/2019),  (05/13/2019),  (05/14/2019) ondansetron (ZOFRAN) 8 mg in sodium chloride 0.9 % 50 mL IVPB, , Intravenous,  Once, 1 of 4 cycles Administration: 8 mg (05/10/2019), 8 mg (05/11/2019), 8 mg (05/12/2019), 8 mg (05/13/2019), 8 mg (05/14/2019) cyclophosphamide (CYTOXAN) 1,560 mg in sodium chloride 0.9 % 250 mL chemo infusion, 750 mg/m2 = 1,560 mg, Intravenous,  Once, 1 of 4 cycles riTUXimab-pvvr (RUXIENCE) 800 mg in sodium chloride 0.9 % 250 mL (2.4242 mg/mL) infusion, 375 mg/m2 = 800 mg, Intravenous,  Once, 1 of 4 cycles Dose modification: 100 mg (original dose 375 mg/m2, Cycle 1, Reason: Other (see comments), Comment: test dose), 600 mg (original dose 375 mg/m2, Cycle 1, Reason: Other (see comments), Comment: due to previous reaction giving test dose first) Administration: 800 mg (05/10/2019)  for chemotherapy treatment.    05/11/2019 - 05/11/2019 Chemotherapy   The patient had DOXOrubicin (ADRIAMYCIN) chemo injection 104 mg, 50 mg/m2, Intravenous,  Once, 0 of 6 cycles palonosetron (ALOXI) injection 0.25 mg, 0.25 mg, Intravenous,  Once, 0 of 6  cycles pegfilgrastim-jmdb (FULPHILA) injection 6 mg, 6 mg, Subcutaneous,  Once, 0 of 6 cycles vinCRIStine (ONCOVIN) 2 mg in sodium chloride 0.9 % 50 mL chemo infusion, 2 mg, Intravenous,  Once, 0 of 6 cycles cyclophosphamide (CYTOXAN) 1,540 mg in sodium chloride 0.9 % 250 mL chemo infusion, 750 mg/m2, Intravenous,  Once, 0 of 6 cycles fosaprepitant (EMEND) 150 mg, dexamethasone (DECADRON) 12 mg in sodium chloride 0.9 % 145 mL IVPB, , Intravenous,  Once, 0 of 6 cycles riTUXimab-pvvr (RUXIENCE) 800 mg in sodium chloride 0.9 % 250 mL (2.4242 mg/mL) infusion, 375 mg/m2, Intravenous,  Once, 0 of 6 cycles  for chemotherapy treatment.       CANCER STAGING: Cancer Staging No matching staging information was found for the patient.   INTERVAL HISTORY:  Kevin [name] 63 y.o. male seen for follow-up of high-grade lymphoma.  Is taking Eliquis twice daily.  Denies any pleuritic type of chest pain.  Appetite and energy levels are low at 25%.  Has pinkish urine.  He is on oxygen at home.  He has mild constipation.  He is experiencing some sleep problems.  He also reports easy bruising.  Denies any fevers or night sweats.    REVIEW OF SYSTEMS:  Review of Systems  Gastrointestinal: Positive for constipation.  Genitourinary: Positive for hematuria.   Hematological: Bruises/bleeds easily.  Psychiatric/Behavioral: Positive for sleep disturbance. The patient is nervous/anxious.   All other systems reviewed and are negative.    PAST MEDICAL/SURGICAL HISTORY:  Past Medical History:  Diagnosis Date  . 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 Hyperlipidemia 02/01/2014  . Hypertension   .  Leg DVT (deep venous thromboembolism), acute, bilateral ([place]) 04/18/2019  . Port-A-Cath in place 04/27/2019   Past Surgical History:  Procedure Laterality Date  . CYSTOSCOPY W/ URETERAL STENT PLACEMENT Bilateral 04/21/2019   Procedure: CYSTOSCOPY WITH RETROGRADE PYELOGRAM/URETERAL STENT PLACEMENT;  Surgeon:  Cleon Gustin, MD;  Location: [facility];  Service: Urology;  Laterality: Bilateral;  . INGUINAL LYMPH NODE BIOPSY Right 04/21/2019   Procedure: INGUINAL LYMPH NODE BIOPSY;  Surgeon: Virl Cagey, MD;  Location: [facility];  Service: General;  Laterality: Right;  . PORTACATH PLACEMENT Left 05/07/2019   Procedure: INSERTION PORT-A-CATH (catheter left subclavian);  Surgeon: Virl Cagey, MD;  Location: [facility];  Service: General;  Laterality: Left;     SOCIAL HISTORY:  Social History   Socioeconomic History  . Marital status: Married    Spouse name: Not on file  . Number of children: Not on file  . Years of education: Not on file  . Highest education level: Not on file  Occupational History  . Not on file  Social Needs  . Financial resource strain: Not on file  . Food insecurity    Worry: Not on file    Inability: Not on file  . Transportation needs    Medical: Not on file    Non-medical: Not on file  Tobacco Use  . Smoking status: Former Smoker    Quit date: 04/27/1999    Years since quitting: 20.0  . Smokeless tobacco: Never Used  Substance and Sexual Activity  . Alcohol use: Yes    Alcohol/week: 56.0 standard drinks    Types: 56 Cans of beer per week  . Drug use: Never  . Sexual activity: Not on file  Lifestyle  . Physical activity    Days per week: Not on file    Minutes per session: Not on file  . Stress: Not on file  Relationships  . Social Herbalist on phone: Not on file    Gets together: Not on file    Attends religious service: Not on file    Active member of club or organization: Not on file    Attends meetings of clubs or organizations: Not on file    Relationship status: Not on file  . Intimate partner violence    Fear of current or ex partner: Not on file    Emotionally abused: Not on file    Physically abused: Not on file    Forced sexual activity: Not on file  Other Topics Concern  . Not on file  Social History Narrative  .  Not on file    FAMILY HISTORY:  Family History  Problem Relation Age of Onset  . Diabetes Brother   . Diabetes Mother   . Leukemia Brother   . Diabetes Son     CURRENT MEDICATIONS:  Outpatient Encounter Medications as of 05/10/2019  Medication Sig Note  . allopurinol (ZYLOPRIM) 300 MG tablet Take 1 tablet (300 mg total) by mouth daily.   Marland Kitchen amLODipine (NORVASC) 10 MG tablet Take 1 tablet (10 mg total) by mouth daily.   Marland Kitchen apixaban (ELIQUIS) 5 MG TABS tablet Take 1 tablet (5 mg total) by mouth 2 (two) times daily.   . CYCLOPHOSPHAMIDE IV Inject into the vein every 21 ( twenty-one) days.   Marland Kitchen DOXORUBICIN HCL IV Inject into the vein every 21 ( twenty-one) days.   Marland Kitchen HYDROcodone-acetaminophen (NORCO/VICODIN) 5-325 MG tablet Take 1 tablet by mouth every 8 (eight) hours as needed for  moderate pain.    Marland Kitchen losartan (COZAAR) 100 MG tablet Take 1 tablet (100 mg total) by mouth daily.   . metoprolol succinate (TOPROL-XL) 50 MG 24 hr tablet Take 1 tablet (50 mg total) by mouth daily.   . OXYGEN Inhale 2 L into the lungs 3 (three) times daily as needed (oxygen level below 93).    Marland Kitchen RITUXIMAB IV Inject into the vein every 21 ( twenty-one) days.   . traMADol (ULTRAM) 50 MG tablet Take 1 tablet (50 mg total) by mouth every 6 (six) hours as needed for moderate pain or severe pain.   . vinCRIStine 2 mg in sodium chloride 0.9 % 50 mL Inject 2 mg into the vein every 21 ( twenty-one) days.   . [DISCONTINUED] enoxaparin (LOVENOX) 150 MG/ML injection Inject 0.92 mLs (140 mg total) into the skin daily. Take lovenox shot once daily in the AM on Wednesday 11/4 and Thursday 11/5 prior to your surgery. 05/03/2019: Will take on 05/05/19 and 05/06/19  . [DISCONTINUED] prochlorperazine (COMPAZINE) 10 MG tablet Take 1 tablet (10 mg total) by mouth every 6 (six) hours as needed (Nausea or vomiting).   . [EXPIRED] acetaminophen (TYLENOL) tablet 650 mg    . [EXPIRED] diphenhydrAMINE (BENADRYL) injection 50 mg    . [EXPIRED]  famotidine (PEPCID) IVPB 20 mg premix    . [EXPIRED] ondansetron (ZOFRAN) 8 mg in sodium chloride 0.9 % 50 mL IVPB    . [EXPIRED] riTUXimab-pvvr (RUXIENCE) 800 mg in sodium chloride 0.9 % 250 mL (2.4242 mg/mL) infusion    . [DISCONTINUED] 0.9 %  sodium chloride infusion    . [DISCONTINUED] DOXOrubicin (ADRIAMYCIN) 20 mg, etoposide (VEPESID) 104 mg, vinCRIStine (ONCOVIN) 0.8 mg in sodium chloride 0.9 % 500 mL chemo infusion    . [DISCONTINUED] predniSONE (DELTASONE) tablet 62 mg  05/10/2019: took some at home and is not needed  . [DISCONTINUED] sodium chloride flush (NS) 0.9 % injection 10 mL     No facility-administered encounter medications on file as of 05/10/2019.     ALLERGIES:  No Known Allergies   PHYSICAL EXAM:  ECOG Performance status: 1  Vitals:   05/10/19 0801  BP: (!) 163/61  Pulse: 88  Resp: 18  Temp: 97.8 F (36.6 C)  SpO2: 95%   Filed Weights   05/10/19 0801  Weight: 207 lb 9.6 oz (94.2 kg)    Physical Exam Vitals signs reviewed.  Constitutional:      Appearance: Normal appearance.  Cardiovascular:     Rate and Rhythm: Normal rate and regular rhythm.     Heart sounds: Normal heart sounds.  Pulmonary:     Effort: Pulmonary effort is normal.     Breath sounds: Normal breath sounds.  Abdominal:     General: There is no distension.     Palpations: Abdomen is soft. There is no mass.  Musculoskeletal:        General: No swelling.  Skin:    General: Skin is warm.  Neurological:     General: No focal deficit present.     Mental Status: He is alert and oriented to person, place, and time.  Psychiatric:        Mood and Affect: Mood normal.        Behavior: Behavior normal.    Bilateral inguinal adenopathy.  LABORATORY DATA:  I have reviewed the labs as listed.  CBC    Component Value Date/Time   WBC 15.1 (H) 05/11/2019 1156   RBC 3.87 (L) 05/11/2019 1156  HGB 11.5 (L) 05/11/2019 1156   HCT 35.5 (L) 05/11/2019 1156   PLT 238 05/11/2019 1156   MCV  91.7 05/11/2019 1156   MCH 29.7 05/11/2019 1156   MCHC 32.4 05/11/2019 1156   RDW 12.4 05/11/2019 1156   LYMPHSABS 0.5 (L) 05/11/2019 1156   MONOABS 0.3 05/11/2019 1156   EOSABS 0.0 05/11/2019 1156   BASOSABS 0.0 05/11/2019 1156   CMP Latest Ref Rng & Units 05/14/2019 05/13/2019 05/12/2019  Glucose 70 - 99 mg/dL 126(H) 128(H) 107(H)  BUN 8 - 23 mg/dL 30(H) 39(H) 41(H)  Creatinine 0.61 - 1.24 mg/dL 0.78 1.04 1.06  Sodium 135 - 145 mmol/L 143 142 140  Potassium 3.5 - 5.1 mmol/L 4.3 4.2 3.8  Chloride 98 - 111 mmol/L 107 106 105  CO2 22 - 32 mmol/L _0 Calcium 8.9 - 10.3 mg/dL 8.1(L) 8.4(L) 8.5(L)  Total Protein 6.5 - 8.1 g/dL 5.6(L) - -  Total Bilirubin 0.3 - 1.2 mg/dL 0.9 - -  Alkaline Phos 38 - 126 U/L 48 - -  AST 15 - 41 U/L 16 - -  ALT 0 - 44 U/L 14 - -       DIAGNOSTIC IMAGING:  I have independently reviewed the scans and discussed with the patient.     ASSESSMENT & PLAN:   Diffuse large B-cell lymphoma of lymph nodes of multiple regions ([place]) 1.  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 -PET scan on 05/03/2019 shows bulky intensely hypermetabolic peritoneal and retroperitoneal nodal mass, nodularity.  Additional hypermetabolic adenopathy involving the axilla, mediastinum, iliac and inguinal lymph nodes.  Multiple foci of discrete hypermetabolic metastases in the right iliac bone, left iliac crest, left and right scapula, and the C1 vertebral body. 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 -We had a prolonged discussion about aggressive nature of double hit lymphoma with high rates of recurrence. -Hence I recommended changing chemotherapy to dose adjusted R-EPOCH.  We have discussed the schedule and side effects in detail. -he will proceed with his first cycle today.  2.  Bilateral leg DVT: -Ultrasound Doppler on 04/18/2019 for  history of extensive right leg DVT and left tibial and peroneal DVT. -VQ scan has low probability of PE.  Because of shortness of breath, it was presumed that he had PE. -He is continuing Eliquis and tolerating it very well.  He has mild hematuria.  3.  Right hydronephrosis: -Cystoscopy and bilateral JJ stent placement on 04/21/2019.  He continues to have mild hematuria.  Addendum: -This patient developed infusion reaction with rituximab less than an hour after the start of infusion.  He developed epigastric and periumbilical pain.  He was given Solu-Medrol, morphine. -After the abdominal pain subsided completely, we have restarted infusion.  He developed periumbilical pain again.  We stopped his infusion. -Further work-up including EKG were within normal limits.  I plan to rechallenge with rituximab in 1 week. -he will come back tomorrow to proceed with his chemotherapy.   Total time spent is 40 minutes with more than 50% of the time spent face-to-face discussing treatment plan, side effects, managing infusion reaction, counseling and coordination of care.  Orders placed this encounter:  Orders Placed This Encounter  Procedures  . CBC with Differential/Platelet  . Comprehensive metabolic panel  . Magnesium  . Phosphorus  . Uric acid      Derek Jack, MD Deneise Lever  [place] [phone]

## 2019-05-10 NOTE — Patient Instructions (Addendum)
[facility] Discharge Instructions for Patients Receiving Chemotherapy   Beginning January 23rd 2017 lab work for the Cancer Center will be done in the  Main lab at [facility] on 1st floor. If you have a lab appointment with the Cancer Center please come in thru the  Main Entrance and check in at the main information desk   Today you received the following chemotherapy agents Rituxan. Follow-up as scheduled. Call clinic for any questions or concerns  To help prevent nausea and vomiting after your treatment, we encourage you to take your nausea medication   If you develop nausea and vomiting, or diarrhea that is not controlled by your medication, call the clinic.  The clinic phone number is [phone]. Office hours are Monday-Friday 8:30am-5:00pm.  BELOW ARE SYMPTOMS THAT SHOULD BE REPORTED IMMEDIATELY:  *FEVER GREATER THAN 101.0 F  *CHILLS WITH OR WITHOUT FEVER  NAUSEA AND VOMITING THAT IS NOT CONTROLLED WITH YOUR NAUSEA MEDICATION  *UNUSUAL SHORTNESS OF BREATH  *UNUSUAL BRUISING OR BLEEDING  TENDERNESS IN MOUTH AND THROAT WITH OR WITHOUT PRESENCE OF ULCERS  *URINARY PROBLEMS  *BOWEL PROBLEMS  UNUSUAL RASH Items with * indicate a potential emergency and should be followed up as soon as possible. If you have an emergency after office hours please contact your primary care physician or go to the nearest emergency department.  Please call the clinic during office hours if you have any questions or concerns.   You may also contact the Patient Navigator at [phone] should you have any questions or need assistance in obtaining follow up care.      Resources For Cancer Patients and their Caregivers ? American Cancer Society: Can assist with transportation, wigs, general needs, runs Look Good Feel Better.        1-888-227-6333 ? Cancer Care: Provides financial assistance, online support groups, medication/co-pay assistance.  1-800-813-HOPE  (4673) ? [facility] Assists [place] cancer patients and their families through emotional , educational and financial support.  336-427-4357 ? [place] DSS Where to apply for food stamps, Medicaid and utility assistance. 336-342-1394 ? RCATS: Transportation to medical appointments. 336-347-2287 ? Social Security Administration: May apply for disability if have a Stage IV cancer. 336-342-7796 1-800-772-1213 ? [place] Aging, Disability and Transit Services: Assists with nutrition, care and transit needs. 336-349-2343         

## 2019-05-10 NOTE — Patient Instructions (Addendum)
[place] at [facility] Discharge Instructions  You were seen today by Dr. Delton Coombes. He went over your recent lab results. 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 Dr. Raliegh Ip will start a new treatment today, this treatment is the same as you were given before with one additional medication. Dr. Raliegh Ip went over the possible side effects. He will see you back in 1 week for labs and follow up.  Take the prednisone 3 tablets twice a day starting tomorrow morning.  Thank you for choosing [place] at [facility] to provide your oncology and hematology care.  To afford each patient quality time with our provider, please arrive at least 15 minutes before your scheduled appointment time.   If you have a lab appointment with the [facility] please come in thru the  Main Entrance and check in at the main information desk  You need to re-schedule your appointment should you arrive 10 or more minutes late.  We strive to give you quality time with our providers, and arriving late affects you and other patients whose appointments are after yours.  Also, if you no show three or more times for appointments you may be dismissed from the clinic at the providers discretion.     Again, thank you for choosing [facility].  Our hope is that these requests will decrease the amount of time that you wait before being seen by our physicians.       _____________________________________________________________  Should you have questions after your visit to [facility], please contact our office at [phone] between the hours of 8:00 a.m. and 4:30 p.m.  Voicemails left after 4:00 p.m. will not be returned until the following business day.  For prescription refill requests, have your pharmacy contact our office and allow 72 hours.    Cancer Center Support Programs:   > Cancer Support Group  2nd Tuesday of the month 1pm-2pm, Journey Room

## 2019-05-10 NOTE — Progress Notes (Signed)
6333 Labs reviewed with Dr. Delton Coombes and pt approved to start chemo tx at this time per MD.CXR and ECHO results reviewed prior to starting treatment                             0940 Pt and his wife viewed the Infusystem pump video with all questions answered 0955 Pt seen by Dr. Delton Coombes                                                                               1158 Pt complained of chest tightness and upper abdominal pain rated a 10. Rituxan infusion stopped. Pt diaphoretic. O2 sat 90% O2 started at 2L N/C.See flowsheet for all vital signs.1159 RLockamy NP in room.NS started at 500 ml/hr.1202 Solumedrol 125 mg given IV per NP orders 1205 Dr. Delton Coombes in room EKG showing NSR.1206 Morphine 2 mg given IV 1215 O2 decreased to 2 L N/C.1222 Pt continues to complain of upper abdominal pain rated a 6 Morphine 2 mg given IV per Dr. Tomie China order. 1228 Up to bedside using urinal with assistance.1230 Feeling better per Pt rated pain a 4.1300 VSS Pt resting comfortably. Pt reports pain is a 1 now. 1330 VSS Pt denies any pain at this time. OK to restart Rituxan infusion at 50 mg/hr per RLockamy NP. 1332 Rituxan restarted at 50 mg/hr. Galt Dr Delton Coombes in to see pt who denies any complaints at this time 1504 Pt complains of upper abdominal cramping and tightness rated a 5 and feeling hot.Pt sweating as well. Rituxan infusion stopped. O2 on at 2L N/C. Vitals documented in flowsheet.NS started at 200 ml/hr.1506 Dr. Delton Coombes in to see pt. 1508 Morphine 2 mg given IV per MD order. 1518 Pt up to use urinal with assistance. 1523 Pt resting and rated his pain a 4 1600 Remaining Rituxan and chemo pump to be held today per Dr. Delton Coombes. 1630 Pt denies any further pain. VSS Pt discharged via wheelchair in stable condition

## 2019-05-11 ENCOUNTER — Ambulatory Visit (HOSPITAL_COMMUNITY): Payer: BLUE CROSS/BLUE SHIELD | Admitting: Hematology

## 2019-05-11 ENCOUNTER — Encounter (HOSPITAL_COMMUNITY): Payer: Self-pay | Admitting: General Practice

## 2019-05-11 ENCOUNTER — Inpatient Hospital Stay (HOSPITAL_COMMUNITY): Payer: BLUE CROSS/BLUE SHIELD

## 2019-05-11 ENCOUNTER — Other Ambulatory Visit (HOSPITAL_COMMUNITY): Payer: BLUE CROSS/BLUE SHIELD

## 2019-05-11 ENCOUNTER — Ambulatory Visit (HOSPITAL_COMMUNITY): Payer: BLUE CROSS/BLUE SHIELD

## 2019-05-11 ENCOUNTER — Encounter (HOSPITAL_COMMUNITY): Payer: Self-pay

## 2019-05-11 ENCOUNTER — Inpatient Hospital Stay (HOSPITAL_COMMUNITY): Payer: BLUE CROSS/BLUE SHIELD | Admitting: General Practice

## 2019-05-11 VITALS — BP 145/56 | HR 74 | Temp 97.8°F | Resp 18 | Wt 208.0 lb

## 2019-05-11 DIAGNOSIS — C8338 Diffuse large B-cell lymphoma, lymph nodes of multiple sites: Secondary | ICD-10-CM | POA: Diagnosis not present

## 2019-05-11 DIAGNOSIS — Z5189 Encounter for other specified aftercare: Secondary | ICD-10-CM | POA: Diagnosis not present

## 2019-05-11 DIAGNOSIS — Z5111 Encounter for antineoplastic chemotherapy: Secondary | ICD-10-CM | POA: Diagnosis not present

## 2019-05-11 DIAGNOSIS — Z95828 Presence of other vascular implants and grafts: Secondary | ICD-10-CM

## 2019-05-11 DIAGNOSIS — Z5112 Encounter for antineoplastic immunotherapy: Secondary | ICD-10-CM | POA: Diagnosis not present

## 2019-05-11 LAB — COMPREHENSIVE METABOLIC PANEL
ALT: 17 U/L (ref 0–44)
AST: 24 U/L (ref 15–41)
Albumin: 2.8 g/dL — ABNORMAL LOW (ref 3.5–5.0)
Alkaline Phosphatase: 63 U/L (ref 38–126)
Anion gap: 13 (ref 5–15)
BUN: 40 mg/dL — ABNORMAL HIGH (ref 8–23)
CO2: 24 mmol/L (ref 22–32)
Calcium: 8.7 mg/dL — ABNORMAL LOW (ref 8.9–10.3)
Chloride: 104 mmol/L (ref 98–111)
Creatinine, Ser: 1.48 mg/dL — ABNORMAL HIGH (ref 0.61–1.24)
GFR calc Af Amer: 57 mL/min — ABNORMAL LOW (ref >60)
GFR calc non Af Amer: 49 mL/min — ABNORMAL LOW (ref >60)
Glucose, Bld: 187 mg/dL — ABNORMAL HIGH (ref 70–99)
Potassium: 3.8 mmol/L (ref 3.5–5.1)
Sodium: 141 mmol/L (ref 135–145)
Total Bilirubin: 0.5 mg/dL (ref 0.3–1.2)
Total Protein: 5.6 g/dL — ABNORMAL LOW (ref 6.5–8.1)

## 2019-05-11 LAB — CBC WITH DIFFERENTIAL/PLATELET
Abs Immature Granulocytes: 0.13 10*3/uL — ABNORMAL HIGH (ref 0.00–0.07)
Basophils Absolute: 0 10*3/uL (ref 0.0–0.1)
Basophils Relative: 0 %
Eosinophils Absolute: 0 10*3/uL (ref 0.0–0.5)
Eosinophils Relative: 0 %
HCT: 35.5 % — ABNORMAL LOW (ref 39.0–52.0)
Hemoglobin: 11.5 g/dL — ABNORMAL LOW (ref 13.0–17.0)
Immature Granulocytes: 1 %
Lymphocytes Relative: 3 %
Lymphs Abs: 0.5 10*3/uL — ABNORMAL LOW (ref 0.7–4.0)
MCH: 29.7 pg (ref 26.0–34.0)
MCHC: 32.4 g/dL (ref 30.0–36.0)
MCV: 91.7 fL (ref 80.0–100.0)
Monocytes Absolute: 0.3 10*3/uL (ref 0.1–1.0)
Monocytes Relative: 2 %
Neutro Abs: 14.2 10*3/uL — ABNORMAL HIGH (ref 1.7–7.7)
Neutrophils Relative %: 94 %
Platelets: 238 10*3/uL (ref 150–400)
RBC: 3.87 MIL/uL — ABNORMAL LOW (ref 4.22–5.81)
RDW: 12.4 % (ref 11.5–15.5)
WBC: 15.1 10*3/uL — ABNORMAL HIGH (ref 4.0–10.5)
nRBC: 0 % (ref 0.0–0.2)

## 2019-05-11 LAB — URIC ACID: Uric Acid, Serum: 4.4 mg/dL (ref 3.7–8.6)

## 2019-05-11 LAB — MAGNESIUM: Magnesium: 2.2 mg/dL (ref 1.7–2.4)

## 2019-05-11 LAB — PHOSPHORUS: Phosphorus: 4.3 mg/dL (ref 2.5–4.6)

## 2019-05-11 MED ORDER — SODIUM CHLORIDE 0.9 % IV SOLN
INTRAVENOUS | Status: DC
  Administered 2019-05-11: 13:00:00 via INTRAVENOUS

## 2019-05-11 MED ORDER — SODIUM CHLORIDE 0.9 % IV SOLN
Freq: Once | INTRAVENOUS | Status: AC
  Administered 2019-05-11: 8 mg via INTRAVENOUS
  Filled 2019-05-11: qty 4

## 2019-05-11 MED ORDER — VINCRISTINE SULFATE CHEMO INJECTION 1 MG/ML
Freq: Once | INTRAVENOUS | Status: AC
  Administered 2019-05-11: 14:00:00 via INTRAVENOUS
  Filled 2019-05-11: qty 10

## 2019-05-11 NOTE — Progress Notes (Signed)
1245 Lab results reviewed with Dr. Delton Coombes and orders obtained for IV hydration with 500 ml NS over 1 hour and repeat BMET,uric acid,magnesium and phosphorus tomorrow per MD                                                                     [name] tolerated hydration and 5FU pump administration well without complaints or incident. Reviewed ambulatory infusion flow sheet with pt and his daughter with understanding verbalized and copy given to pt.Spill kit given to pt as well with instructions reviewed. VSS upon discharge. Pt discharged self ambulatory in satisfactory condition accompanied by his daughter

## 2019-05-11 NOTE — Progress Notes (Signed)
[facility] CSW Progress Notes  Requested that [facility] fax paperwork to [facility] nurse navigator so she can assist w completing short term disability paperwork on patient behalf.  Left patient VM w this information.   Edwyna Shell, LCSW Clinical Social Worker Phone:  [phone] Cell:  [phone]

## 2019-05-11 NOTE — Progress Notes (Signed)
[place] Initial Psychosocial Assessment Clinical Social Work  Clinical Social Work contacted by phone to assess psychosocial, emotional, mental health, and spiritual needs of the patient.   Barriers to care/review of distress screen:  - Transportation:  Do you anticipate any problems getting to appointments?  Do you have someone who can help run errands for you if you need it?  Has family members who are available to transport as needed - hopes to be able to drive himself as much as possible - Help at home:  What is your living situation (alone, family, other)?  If you are physically unable to care for yourself, who would you call on to help you?  Lives w wife, she is able to assist as needed depending on her work situation/schedule. - Support system:  What does your support system look like?  Who would you call on if you needed some kind of practical help?  What if you needed someone to talk to for emotional support?  No concerns, has supportive family including wife, daughter, sons and grandchildren.  Has friends in the area who are also available to him.  - Finances:  Are you concerned about finances.   Considering returning to work?  If not, applying for disability?  Hopes to return to work ASAP - aware will probably be after first of year.  Believes he has short term disability available through his job.  Has given paperwork to his PCP.  Wife is working.  Thus far has not had concerns about affording daily bills.  Insured w [employer] through his work.  SM and VM left for Antonietta Barcelona, disability/FMLA coordinator for his PCP [place].    What is your understanding of where you are with your cancer? Its cause?  Your treatment plan and what happens next?  Just beginning treatment for lymphoma.  Aware that "they had to change my treatment from yesterday", possible side effects from treatment.  Aware that today's visit the chemotherapy was adjusted, and that he will restart his prior  regimen next week.  He has had days of low energy, feels this is getting better.  Was surprised by unexpected diagnosis of cancer, but is taking things "one day at a time."     What are your worries for the future as you begin treatment for cancer?  Being out of work  What are your hopes and priorities during your treatment? What is important to you? What are your goals for your care?  Wants to return to work ASAP.  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  Enjoys his work and the opportunity to be w his coworkers as well.     CSW Summary:  Patient and family psychosocial functioning including strengths, limitations, and coping skills:  Patient is married, lives w wife, has supportive family members and friends.  New diagnosis of lymphoma.  Not working at present, has insurance.  Unclear about whether he has both short and long term disability, has submitted some kind of paperwork to his PCP for completion re his job.  Accepting of this diagnosis, does not remember much about the expected course of treatment.  Hopes to return to work soon.  Likes to be with other people - connects at work and in his shop at home.  Is regaining some energy and now feels more able to "piddle" in his shop or complete home/yard tasks.  Likes to  keep busy and be around others.    Identifications of barriers to care:  Unclear whether finances will be a burden, not at present.  Unsure of overall availability of resources for short term disability  Availability of community resources:  None needed at present.    Clinical Social Worker follow up needed: Yes.   Call in to [facility] to determine status of paperwork he brought last week.  Will advise patient when I hear from Antonietta Barcelona, LPN, from that practice.  Edwyna Shell, LCSW Clinical Social Worker Phone:  [phone] Cell:  [phone]

## 2019-05-11 NOTE — Patient Instructions (Signed)
[facility] Discharge Instructions for Patients Receiving Chemotherapy   Beginning January 23rd 2017 lab work for the [facility] will be done in the  Main lab at [facility] on 1st floor. If you have a lab appointment with the [facility] please come in thru the  Main Entrance and check in at the main information desk   Today you received the following chemotherapy agents 5FU pump. Follow-up as scheduled. Call clinic for any questions or concerns  To help prevent nausea and vomiting after your treatment, we encourage you to take your nausea medication   If you develop nausea and vomiting, or diarrhea that is not controlled by your medication, call the clinic.  The clinic phone number is [phone]. Office hours are Monday-Friday 8:30am-5:00pm.  BELOW ARE SYMPTOMS THAT SHOULD BE REPORTED IMMEDIATELY:  *FEVER GREATER THAN 101.0 F  *CHILLS WITH OR WITHOUT FEVER  NAUSEA AND VOMITING THAT IS NOT CONTROLLED WITH YOUR NAUSEA MEDICATION  *UNUSUAL SHORTNESS OF BREATH  *UNUSUAL BRUISING OR BLEEDING  TENDERNESS IN MOUTH AND THROAT WITH OR WITHOUT PRESENCE OF ULCERS  *URINARY PROBLEMS  *BOWEL PROBLEMS  UNUSUAL RASH Items with * indicate a potential emergency and should be followed up as soon as possible. If you have an emergency after office hours please contact your primary care physician or go to the nearest emergency department.  Please call the clinic during office hours if you have any questions or concerns.   You may also contact the Patient Navigator at [phone] should you have any questions or need assistance in obtaining follow up care.      Resources For Cancer Patients and their Caregivers ? American Cancer Society: Can assist with transportation, wigs, general needs, runs Look Good Feel Better.        870-170-8119 ? Cancer Care: Provides financial assistance, online support groups, medication/co-pay assistance.  1-800-813-HOPE  336 803 4565) ? [facility] Assists [place] cancer patients and their families through emotional , educational and financial support.  901-097-6509 ? [place] DSS Where to apply for food stamps, Medicaid and utility assistance. 315-492-7609 ? RCATS: Transportation to medical appointments. 445-056-2020 ? Social Security Administration: May apply for disability if have a Stage IV cancer. 346-573-2195 [phone] ? [employer], Disability and Transit Services: Assists with nutrition, care and transit needs. ([phone]

## 2019-05-11 NOTE — Progress Notes (Signed)
05/11/19  Patient will get day 1 pump today  05/11/19 - day 1 05/12/19 - day 2 05/13/19 - day 3 05/14/19 - day 4 with DC on Saturday 05/15/19 05/17/19 - give Cytoxan plus a re-challenge with 700 mg Rituximab as he received approximately 100 mg on 05/10/19. Give Morphine 2 mg IV x 1 prior to Rituximab and increase famotidine 40 mg IVPB x 1  V.O. Dr Rhys Martini, PharmD

## 2019-05-12 ENCOUNTER — Inpatient Hospital Stay (HOSPITAL_COMMUNITY): Payer: BLUE CROSS/BLUE SHIELD

## 2019-05-12 ENCOUNTER — Other Ambulatory Visit: Payer: Self-pay

## 2019-05-12 ENCOUNTER — Encounter (HOSPITAL_COMMUNITY): Payer: Self-pay

## 2019-05-12 VITALS — BP 128/62 | HR 62 | Temp 97.6°F | Resp 18

## 2019-05-12 DIAGNOSIS — C8338 Diffuse large B-cell lymphoma, lymph nodes of multiple sites: Secondary | ICD-10-CM

## 2019-05-12 DIAGNOSIS — Z5112 Encounter for antineoplastic immunotherapy: Secondary | ICD-10-CM | POA: Diagnosis not present

## 2019-05-12 DIAGNOSIS — Z5189 Encounter for other specified aftercare: Secondary | ICD-10-CM | POA: Diagnosis not present

## 2019-05-12 DIAGNOSIS — Z5111 Encounter for antineoplastic chemotherapy: Secondary | ICD-10-CM | POA: Diagnosis not present

## 2019-05-12 DIAGNOSIS — Z95828 Presence of other vascular implants and grafts: Secondary | ICD-10-CM

## 2019-05-12 LAB — BASIC METABOLIC PANEL
Anion gap: 10 (ref 5–15)
BUN: 41 mg/dL — ABNORMAL HIGH (ref 8–23)
CO2: 25 mmol/L (ref 22–32)
Calcium: 8.5 mg/dL — ABNORMAL LOW (ref 8.9–10.3)
Chloride: 105 mmol/L (ref 98–111)
Creatinine, Ser: 1.06 mg/dL (ref 0.61–1.24)
GFR calc Af Amer: >60 mL/min (ref >60)
GFR calc non Af Amer: >60 mL/min (ref >60)
Glucose, Bld: 107 mg/dL — ABNORMAL HIGH (ref 70–99)
Potassium: 3.8 mmol/L (ref 3.5–5.1)
Sodium: 140 mmol/L (ref 135–145)

## 2019-05-12 LAB — PHOSPHORUS: Phosphorus: 3.6 mg/dL (ref 2.5–4.6)

## 2019-05-12 LAB — URIC ACID: Uric Acid, Serum: 4.7 mg/dL (ref 3.7–8.6)

## 2019-05-12 LAB — MAGNESIUM: Magnesium: 2.3 mg/dL (ref 1.7–2.4)

## 2019-05-12 MED ORDER — SODIUM CHLORIDE 0.9 % IV SOLN
Freq: Once | INTRAVENOUS | Status: AC
  Administered 2019-05-12: 12:00:00 via INTRAVENOUS

## 2019-05-12 MED ORDER — SODIUM CHLORIDE 0.9 % IV SOLN
Freq: Once | INTRAVENOUS | Status: AC
  Administered 2019-05-12: 8 mg via INTRAVENOUS
  Filled 2019-05-12: qty 4

## 2019-05-12 MED ORDER — VINCRISTINE SULFATE CHEMO INJECTION 1 MG/ML
Freq: Once | INTRAVENOUS | Status: AC
  Administered 2019-05-12: 13:00:00 via INTRAVENOUS
  Filled 2019-05-12: qty 10

## 2019-05-12 NOTE — Progress Notes (Signed)
To treatment room for labs and pump change.  Patient stated he felt better today and denied abdominal pain today.  Stated he was drinking good water at home with no nausea.  Port site clean and dry with dressing intact.  No bruising or swelling noted at site.  Labs drawn from port.  No s/s of distress noted.    Labs completed with Dr. Delton Coombes notified.  Patient may go home and return tomorrow with repeat of same labs.    Patient tolerated nausea medications with no complaints voiced.  Chemotherapy pump connected with instructions when to return tomorrow.  Patient verbalized understanding.  Reminded the patient of the numbers to call for after hours if needed.  Patient verbalized understanding.  VSS with discharge and left ambulatory with no s/s of distress noted.

## 2019-05-13 ENCOUNTER — Inpatient Hospital Stay (HOSPITAL_COMMUNITY): Payer: BLUE CROSS/BLUE SHIELD

## 2019-05-13 VITALS — BP 132/54 | HR 66 | Temp 97.5°F | Resp 18

## 2019-05-13 DIAGNOSIS — Z95828 Presence of other vascular implants and grafts: Secondary | ICD-10-CM

## 2019-05-13 DIAGNOSIS — Z5111 Encounter for antineoplastic chemotherapy: Secondary | ICD-10-CM | POA: Diagnosis not present

## 2019-05-13 DIAGNOSIS — C8338 Diffuse large B-cell lymphoma, lymph nodes of multiple sites: Secondary | ICD-10-CM

## 2019-05-13 DIAGNOSIS — Z5189 Encounter for other specified aftercare: Secondary | ICD-10-CM | POA: Diagnosis not present

## 2019-05-13 DIAGNOSIS — Z5112 Encounter for antineoplastic immunotherapy: Secondary | ICD-10-CM | POA: Diagnosis not present

## 2019-05-13 LAB — BASIC METABOLIC PANEL
Anion gap: 10 (ref 5–15)
BUN: 39 mg/dL — ABNORMAL HIGH (ref 8–23)
CO2: 26 mmol/L (ref 22–32)
Calcium: 8.4 mg/dL — ABNORMAL LOW (ref 8.9–10.3)
Chloride: 106 mmol/L (ref 98–111)
Creatinine, Ser: 1.04 mg/dL (ref 0.61–1.24)
GFR calc Af Amer: >60 mL/min (ref >60)
GFR calc non Af Amer: >60 mL/min (ref >60)
Glucose, Bld: 128 mg/dL — ABNORMAL HIGH (ref 70–99)
Potassium: 4.2 mmol/L (ref 3.5–5.1)
Sodium: 142 mmol/L (ref 135–145)

## 2019-05-13 LAB — PHOSPHORUS: Phosphorus: 4.7 mg/dL — ABNORMAL HIGH (ref 2.5–4.6)

## 2019-05-13 LAB — URIC ACID: Uric Acid, Serum: 4.5 mg/dL (ref 3.7–8.6)

## 2019-05-13 LAB — MAGNESIUM: Magnesium: 2.2 mg/dL (ref 1.7–2.4)

## 2019-05-13 MED ORDER — SODIUM CHLORIDE 0.9 % IV SOLN
Freq: Once | INTRAVENOUS | Status: AC
  Administered 2019-05-13: 8 mg via INTRAVENOUS
  Filled 2019-05-13: qty 4

## 2019-05-13 MED ORDER — VINCRISTINE SULFATE CHEMO INJECTION 1 MG/ML
Freq: Once | INTRAVENOUS | Status: AC
  Administered 2019-05-13: 13:00:00 via INTRAVENOUS
  Filled 2019-05-13: qty 10

## 2019-05-13 MED ORDER — SODIUM CHLORIDE 0.9 % IV SOLN
INTRAVENOUS | Status: DC
  Administered 2019-05-13: 12:00:00 via INTRAVENOUS

## 2019-05-13 NOTE — Patient Instructions (Signed)
[facility] Discharge Instructions for Patients Receiving Chemotherapy  Today you received the following chemotherapy agents   To help prevent nausea and vomiting after your treatment, we encourage you to take your nausea medication   If you develop nausea and vomiting that is not controlled by your nausea medication, call the clinic.   BELOW ARE SYMPTOMS THAT SHOULD BE REPORTED IMMEDIATELY:  *FEVER GREATER THAN 100.5 F  *CHILLS WITH OR WITHOUT FEVER  NAUSEA AND VOMITING THAT IS NOT CONTROLLED WITH YOUR NAUSEA MEDICATION  *UNUSUAL SHORTNESS OF BREATH  *UNUSUAL BRUISING OR BLEEDING  TENDERNESS IN MOUTH AND THROAT WITH OR WITHOUT PRESENCE OF ULCERS  *URINARY PROBLEMS  *BOWEL PROBLEMS  UNUSUAL RASH Items with * indicate a potential emergency and should be followed up as soon as possible.  Feel free to call the clinic should you have any questions or concerns. The clinic phone number is [phone].  Please show the CHEMO ALERT CARD at check-in to the Emergency Department and triage nurse.   

## 2019-05-13 NOTE — Progress Notes (Signed)
Patient returns today for chemotherapy refill for ambulatory pump. No issues reported at this time. Pump disconnected, flushed with saline, labs drawn and hooked up for saline and premed prior too hooking up the ambulatory pump again.   Labs reviewed with MD. Proceed with chemotherapy today. New bag of chemotherapy administered via ambulatory pump for another 26 hours per orders.   Treatment given per orders. Patient tolerated it well without problems. Vitals stable and discharged home from clinic ambulatory. Follow up as scheduled.

## 2019-05-14 ENCOUNTER — Inpatient Hospital Stay (HOSPITAL_COMMUNITY): Payer: BLUE CROSS/BLUE SHIELD

## 2019-05-14 ENCOUNTER — Other Ambulatory Visit: Payer: Self-pay

## 2019-05-14 ENCOUNTER — Encounter (HOSPITAL_COMMUNITY): Payer: BLUE CROSS/BLUE SHIELD

## 2019-05-14 VITALS — BP 133/58 | HR 65 | Temp 97.5°F | Resp 18

## 2019-05-14 DIAGNOSIS — Z5189 Encounter for other specified aftercare: Secondary | ICD-10-CM | POA: Diagnosis not present

## 2019-05-14 DIAGNOSIS — Z95828 Presence of other vascular implants and grafts: Secondary | ICD-10-CM

## 2019-05-14 DIAGNOSIS — Z5112 Encounter for antineoplastic immunotherapy: Secondary | ICD-10-CM | POA: Diagnosis not present

## 2019-05-14 DIAGNOSIS — Z5111 Encounter for antineoplastic chemotherapy: Secondary | ICD-10-CM | POA: Diagnosis not present

## 2019-05-14 DIAGNOSIS — C8338 Diffuse large B-cell lymphoma, lymph nodes of multiple sites: Secondary | ICD-10-CM | POA: Diagnosis not present

## 2019-05-14 LAB — COMPREHENSIVE METABOLIC PANEL
ALT: 14 U/L (ref 0–44)
AST: 16 U/L (ref 15–41)
Albumin: 2.9 g/dL — ABNORMAL LOW (ref 3.5–5.0)
Alkaline Phosphatase: 48 U/L (ref 38–126)
Anion gap: 9 (ref 5–15)
BUN: 30 mg/dL — ABNORMAL HIGH (ref 8–23)
CO2: 27 mmol/L (ref 22–32)
Calcium: 8.1 mg/dL — ABNORMAL LOW (ref 8.9–10.3)
Chloride: 107 mmol/L (ref 98–111)
Creatinine, Ser: 0.78 mg/dL (ref 0.61–1.24)
GFR calc Af Amer: >60 mL/min (ref >60)
GFR calc non Af Amer: >60 mL/min (ref >60)
Glucose, Bld: 126 mg/dL — ABNORMAL HIGH (ref 70–99)
Potassium: 4.3 mmol/L (ref 3.5–5.1)
Sodium: 143 mmol/L (ref 135–145)
Total Bilirubin: 0.9 mg/dL (ref 0.3–1.2)
Total Protein: 5.6 g/dL — ABNORMAL LOW (ref 6.5–8.1)

## 2019-05-14 LAB — URIC ACID: Uric Acid, Serum: 4 mg/dL (ref 3.7–8.6)

## 2019-05-14 LAB — MAGNESIUM: Magnesium: 2 mg/dL (ref 1.7–2.4)

## 2019-05-14 LAB — PHOSPHORUS: Phosphorus: 4.2 mg/dL (ref 2.5–4.6)

## 2019-05-14 MED ORDER — SODIUM CHLORIDE 0.9 % IV SOLN
Freq: Once | INTRAVENOUS | Status: AC
  Administered 2019-05-14: 11:00:00 via INTRAVENOUS

## 2019-05-14 MED ORDER — VINCRISTINE SULFATE CHEMO INJECTION 1 MG/ML
Freq: Once | INTRAVENOUS | Status: AC
  Administered 2019-05-14: 11:00:00 via INTRAVENOUS
  Filled 2019-05-14: qty 10

## 2019-05-14 MED ORDER — SODIUM CHLORIDE 0.9 % IV SOLN
Freq: Once | INTRAVENOUS | Status: AC
  Administered 2019-05-14: 8 mg via INTRAVENOUS
  Filled 2019-05-14: qty 4

## 2019-05-14 NOTE — Progress Notes (Signed)
Pt presents today for pump d/c with labs and 5FU reconnect. MAR reviewed. Pt has no complaints of any pain or changes. Schedule printed off and given to patient. Patient verbalized understanding to return to the main entrance tomorrow and a nurse will come and get him from the main entrance.   Treatment given today per MD orders. Tolerated infusion without adverse affects. Vital signs stable. No complaints at this time. Discharged from clinic ambulatory. 5FU pump infusion per protocol. RUN noted on the screen. F/U with [facility] as scheduled.

## 2019-05-14 NOTE — Patient Instructions (Signed)
[facility] Discharge Instructions for Patients Receiving Chemotherapy  Today you received the following chemotherapy agents   To help prevent nausea and vomiting after your treatment, we encourage you to take your nausea medication   If you develop nausea and vomiting that is not controlled by your nausea medication, call the clinic.   BELOW ARE SYMPTOMS THAT SHOULD BE REPORTED IMMEDIATELY:  *FEVER GREATER THAN 100.5 F  *CHILLS WITH OR WITHOUT FEVER  NAUSEA AND VOMITING THAT IS NOT CONTROLLED WITH YOUR NAUSEA MEDICATION  *UNUSUAL SHORTNESS OF BREATH  *UNUSUAL BRUISING OR BLEEDING  TENDERNESS IN MOUTH AND THROAT WITH OR WITHOUT PRESENCE OF ULCERS  *URINARY PROBLEMS  *BOWEL PROBLEMS  UNUSUAL RASH Items with * indicate a potential emergency and should be followed up as soon as possible.  Feel free to call the clinic should you have any questions or concerns. The clinic phone number is [phone].  Please show the CHEMO ALERT CARD at check-in to the Emergency Department and triage nurse.   

## 2019-05-15 ENCOUNTER — Inpatient Hospital Stay (HOSPITAL_COMMUNITY): Payer: BLUE CROSS/BLUE SHIELD

## 2019-05-15 VITALS — BP 141/66 | HR 62 | Temp 98.6°F | Resp 20

## 2019-05-15 DIAGNOSIS — Z5111 Encounter for antineoplastic chemotherapy: Secondary | ICD-10-CM | POA: Diagnosis not present

## 2019-05-15 DIAGNOSIS — C8338 Diffuse large B-cell lymphoma, lymph nodes of multiple sites: Secondary | ICD-10-CM

## 2019-05-15 DIAGNOSIS — Z5189 Encounter for other specified aftercare: Secondary | ICD-10-CM | POA: Diagnosis not present

## 2019-05-15 DIAGNOSIS — Z5112 Encounter for antineoplastic immunotherapy: Secondary | ICD-10-CM | POA: Diagnosis not present

## 2019-05-15 MED ORDER — SODIUM CHLORIDE 0.9% FLUSH
10.0000 mL | Freq: Once | INTRAVENOUS | Status: AC
  Administered 2019-05-15: 10 mL via INTRAVENOUS

## 2019-05-15 MED ORDER — HEPARIN SOD (PORK) LOCK FLUSH 100 UNIT/ML IV SOLN
500.0000 [IU] | Freq: Once | INTRAVENOUS | Status: AC
  Administered 2019-05-15: 500 [IU] via INTRAVENOUS

## 2019-05-15 NOTE — Progress Notes (Signed)
Tolerated infusion without any problems. Patient to return to clinic on Monday.

## 2019-05-15 NOTE — Progress Notes (Signed)
Patient tolerated infusion without any problems. Port deaccessed. Site WNL. Patient discharged to self in stable condition. Patient ambulated into and out of the hospital.

## 2019-05-17 ENCOUNTER — Other Ambulatory Visit: Payer: Self-pay

## 2019-05-17 ENCOUNTER — Inpatient Hospital Stay (HOSPITAL_COMMUNITY): Payer: BLUE CROSS/BLUE SHIELD

## 2019-05-17 ENCOUNTER — Encounter (HOSPITAL_COMMUNITY): Payer: Self-pay | Admitting: Hematology

## 2019-05-17 ENCOUNTER — Inpatient Hospital Stay (HOSPITAL_BASED_OUTPATIENT_CLINIC_OR_DEPARTMENT_OTHER): Payer: BLUE CROSS/BLUE SHIELD | Admitting: Hematology

## 2019-05-17 ENCOUNTER — Other Ambulatory Visit (HOSPITAL_COMMUNITY): Payer: BLUE CROSS/BLUE SHIELD

## 2019-05-17 VITALS — BP 106/55 | HR 57 | Temp 98.0°F | Resp 18

## 2019-05-17 DIAGNOSIS — C8338 Diffuse large B-cell lymphoma, lymph nodes of multiple sites: Secondary | ICD-10-CM

## 2019-05-17 DIAGNOSIS — Z5111 Encounter for antineoplastic chemotherapy: Secondary | ICD-10-CM | POA: Diagnosis not present

## 2019-05-17 DIAGNOSIS — Z5112 Encounter for antineoplastic immunotherapy: Secondary | ICD-10-CM | POA: Diagnosis not present

## 2019-05-17 DIAGNOSIS — Z95828 Presence of other vascular implants and grafts: Secondary | ICD-10-CM

## 2019-05-17 DIAGNOSIS — Z5189 Encounter for other specified aftercare: Secondary | ICD-10-CM | POA: Diagnosis not present

## 2019-05-17 LAB — BASIC METABOLIC PANEL
Anion gap: 10 (ref 5–15)
BUN: 17 mg/dL (ref 8–23)
CO2: 30 mmol/L (ref 22–32)
Calcium: 8.1 mg/dL — ABNORMAL LOW (ref 8.9–10.3)
Chloride: 100 mmol/L (ref 98–111)
Creatinine, Ser: 0.55 mg/dL — ABNORMAL LOW (ref 0.61–1.24)
GFR calc Af Amer: >60 mL/min (ref >60)
GFR calc non Af Amer: >60 mL/min (ref >60)
Glucose, Bld: 120 mg/dL — ABNORMAL HIGH (ref 70–99)
Potassium: 3.8 mmol/L (ref 3.5–5.1)
Sodium: 140 mmol/L (ref 135–145)

## 2019-05-17 LAB — URIC ACID: Uric Acid, Serum: 4.2 mg/dL (ref 3.7–8.6)

## 2019-05-17 LAB — CBC WITH DIFFERENTIAL/PLATELET
Abs Immature Granulocytes: 0.12 10*3/uL — ABNORMAL HIGH (ref 0.00–0.07)
Basophils Absolute: 0 10*3/uL (ref 0.0–0.1)
Basophils Relative: 0 %
Eosinophils Absolute: 0.4 10*3/uL (ref 0.0–0.5)
Eosinophils Relative: 6 %
HCT: 38 % — ABNORMAL LOW (ref 39.0–52.0)
Hemoglobin: 12.5 g/dL — ABNORMAL LOW (ref 13.0–17.0)
Immature Granulocytes: 2 %
Lymphocytes Relative: 14 %
Lymphs Abs: 1.1 10*3/uL (ref 0.7–4.0)
MCH: 29.9 pg (ref 26.0–34.0)
MCHC: 32.9 g/dL (ref 30.0–36.0)
MCV: 90.9 fL (ref 80.0–100.0)
Monocytes Absolute: 0 10*3/uL — ABNORMAL LOW (ref 0.1–1.0)
Monocytes Relative: 0 %
Neutro Abs: 6 10*3/uL (ref 1.7–7.7)
Neutrophils Relative %: 78 %
Platelets: 197 10*3/uL (ref 150–400)
RBC: 4.18 MIL/uL — ABNORMAL LOW (ref 4.22–5.81)
RDW: 11.9 % (ref 11.5–15.5)
WBC: 7.6 10*3/uL (ref 4.0–10.5)
nRBC: 0 % (ref 0.0–0.2)

## 2019-05-17 LAB — PHOSPHORUS: Phosphorus: 2.9 mg/dL (ref 2.5–4.6)

## 2019-05-17 LAB — MAGNESIUM: Magnesium: 1.5 mg/dL — ABNORMAL LOW (ref 1.7–2.4)

## 2019-05-17 MED ORDER — DIPHENHYDRAMINE HCL 50 MG/ML IJ SOLN
INTRAMUSCULAR | Status: AC
  Filled 2019-05-17: qty 1

## 2019-05-17 MED ORDER — MORPHINE SULFATE (PF) 2 MG/ML IV SOLN
INTRAVENOUS | Status: AC
  Filled 2019-05-17: qty 1

## 2019-05-17 MED ORDER — SODIUM CHLORIDE 0.9 % IV SOLN
INTRAVENOUS | Status: DC
  Administered 2019-05-17: 14:00:00 via INTRAVENOUS

## 2019-05-17 MED ORDER — MAGNESIUM SULFATE 2 GM/50ML IV SOLN
2.0000 g | Freq: Once | INTRAVENOUS | Status: AC
  Administered 2019-05-17: 2 g via INTRAVENOUS

## 2019-05-17 MED ORDER — SODIUM CHLORIDE 0.9 % IV SOLN
750.0000 mg/m2 | Freq: Once | INTRAVENOUS | Status: AC
  Administered 2019-05-17: 1560 mg via INTRAVENOUS
  Filled 2019-05-17: qty 78

## 2019-05-17 MED ORDER — PEGFILGRASTIM 6 MG/0.6ML ~~LOC~~ PSKT: 6.0000 mg | PREFILLED_SYRINGE | Freq: Once | SUBCUTANEOUS | Status: DC

## 2019-05-17 MED ORDER — SODIUM CHLORIDE 0.9 % IV SOLN
INTRAVENOUS | Status: DC
  Administered 2019-05-17: 10:00:00 via INTRAVENOUS

## 2019-05-17 MED ORDER — DIPHENHYDRAMINE HCL 50 MG/ML IJ SOLN
50.0000 mg | Freq: Once | INTRAMUSCULAR | Status: AC
  Administered 2019-05-17: 50 mg via INTRAVENOUS

## 2019-05-17 MED ORDER — HEPARIN SOD (PORK) LOCK FLUSH 100 UNIT/ML IV SOLN
500.0000 [IU] | Freq: Once | INTRAVENOUS | Status: AC
  Administered 2019-05-17: 15:00:00 500 [IU] via INTRAVENOUS

## 2019-05-17 MED ORDER — SODIUM CHLORIDE 0.9 % IV SOLN
40.0000 mg | Freq: Once | INTRAVENOUS | Status: AC
  Administered 2019-05-17: 40 mg via INTRAVENOUS
  Filled 2019-05-17: qty 4

## 2019-05-17 MED ORDER — SODIUM CHLORIDE 0.9 % IV SOLN
600.0000 mg | Freq: Once | INTRAVENOUS | Status: DC
  Filled 2019-05-17: qty 60

## 2019-05-17 MED ORDER — FAMOTIDINE IN NACL 20-0.9 MG/50ML-% IV SOLN
INTRAVENOUS | Status: AC
  Filled 2019-05-17: qty 50

## 2019-05-17 MED ORDER — ACETAMINOPHEN 325 MG PO TABS
ORAL_TABLET | ORAL | Status: AC
  Filled 2019-05-17: qty 2

## 2019-05-17 MED ORDER — SODIUM CHLORIDE 0.9% FLUSH
10.0000 mL | INTRAVENOUS | Status: DC | PRN
  Administered 2019-05-17: 10:00:00 10 mL via INTRAVENOUS
  Filled 2019-05-17: qty 10

## 2019-05-17 MED ORDER — SODIUM CHLORIDE 0.9 % IV SOLN
Freq: Once | INTRAVENOUS | Status: AC
  Administered 2019-05-17: 16 mg via INTRAVENOUS
  Filled 2019-05-17: qty 8

## 2019-05-17 MED ORDER — MORPHINE SULFATE 4 MG/ML IJ SOLN: 2.0000 mg | Freq: Once | INTRAMUSCULAR | Status: DC

## 2019-05-17 MED ORDER — MORPHINE SULFATE (PF) 2 MG/ML IV SOLN
2.0000 mg | Freq: Once | INTRAVENOUS | Status: AC
  Administered 2019-05-17: 2 mg via INTRAVENOUS
  Filled 2019-05-17: qty 1

## 2019-05-17 MED ORDER — ACETAMINOPHEN 325 MG PO TABS
650.0000 mg | ORAL_TABLET | Freq: Once | ORAL | Status: AC
  Administered 2019-05-17: 11:00:00 650 mg via ORAL

## 2019-05-17 MED ORDER — SODIUM CHLORIDE 0.9 % IV SOLN
100.0000 mg | Freq: Once | INTRAVENOUS | Status: AC
  Administered 2019-05-17: 100 mg via INTRAVENOUS
  Filled 2019-05-17: qty 10

## 2019-05-17 MED ORDER — MAGNESIUM SULFATE 2 GM/50ML IV SOLN
INTRAVENOUS | Status: AC
  Filled 2019-05-17: qty 50

## 2019-05-17 MED ORDER — SODIUM CHLORIDE 0.9 % IV SOLN: 2.0000 g | Freq: Once | INTRAVENOUS | Status: DC

## 2019-05-17 MED ORDER — MORPHINE SULFATE (PF) 2 MG/ML IV SOLN
2.0000 mg | Freq: Once | INTRAVENOUS | Status: AC
  Administered 2019-05-17: 2 mg via INTRAVENOUS

## 2019-05-17 NOTE — Assessment & Plan Note (Addendum)
1.  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 -PET scan on 05/03/2019 shows bulky intensely hypermetabolic peritoneal and retroperitoneal nodal mass, nodularity.  Additional hypermetabolic adenopathy involving the axilla, mediastinum, iliac and inguinal lymph nodes.  Multiple foci of discrete hypermetabolic metastases in the right iliac bone, left iliac crest, left and right scapula, and the C1 vertebral body. 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 -We had a prolonged conversation about aggressive nature of double hit lymphoma with high rates of recurrence. -He received cycle 1 of chemotherapy with EPOCH on 05/11/2019.  He received first dose of rituximab on 05/10/2019 and had reaction with abdominal pain.  We have rechallenged him and he again developed abdominal pain.  Hence we abandoned any further treatment on that day. -He felt better after cycle 1 of treatment.  He denies any hematuria.  Denied any chemotherapy induced side effects including nausea, vomiting or diarrhea.  No neuropathy reported. -I have reviewed his labs including tumor lysis labs.  He has hypomagnesemia with magnesium of 1.5.  He will receive 2 g of IV magnesium today. -He will receive cyclophosphamide today.  We will also rechallenge him with rituximab 100 mg infusion.  We will give the rest of the dose if he tolerates the first dose.  I will reevaluate him with weekly labs. -He will come back tomorrow for Neulasta injection.  2.  Bilateral leg DVT: -Ultrasound Doppler on 04/18/2019 + for bilateral leg DVT. -She is continuing Eliquis without any problems.  3.  Right hydronephrosis: -Cystoscopy and bilateral JJ stent placement on 04/21/2019.  He continues to have mild hematuria.  Addendum: -I was called by the nurses.  Towards the end of his test dose of 100 mg of rituximab, when he was  receiving infusion at rate of 150 mg/h, he developed mild epigastric pain.  He reported as tightness.  He did receive premedication with Benadryl, Pepcid, morphine 2 mg, dexamethasone and antinausea medication. -We have given another dose of morphine 2 mg.  We have abandoned any further treatment with rituximab during this cycle. -I plan to treat him again with cycle 2.

## 2019-05-17 NOTE — Assessment & Plan Note (Signed)
1.  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 -PET scan on 05/03/2019 shows bulky intensely hypermetabolic peritoneal and retroperitoneal nodal mass, nodularity.  Additional hypermetabolic adenopathy involving the axilla, mediastinum, iliac and inguinal lymph nodes.  Multiple foci of discrete hypermetabolic metastases in the right iliac bone, left iliac crest, left and right scapula, and the C1 vertebral body. 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 -We had a prolonged discussion about aggressive nature of double hit lymphoma with high rates of recurrence. -Hence I recommended changing chemotherapy to dose adjusted R-EPOCH.  We have discussed the schedule and side effects in detail. -he will proceed with his first cycle today.  2.  Bilateral leg DVT: -Ultrasound Doppler on 04/18/2019 for history of extensive right leg DVT and left tibial and peroneal DVT. -VQ scan has low probability of PE.  Because of shortness of breath, it was presumed that he had PE. -He is continuing Eliquis and tolerating it very well.  He has mild hematuria.  3.  Right hydronephrosis: -Cystoscopy and bilateral JJ stent placement on 04/21/2019.  He continues to have mild hematuria.  Addendum: -This patient developed infusion reaction with rituximab less than an hour after the start of infusion.  He developed epigastric and periumbilical pain.  He was given Solu-Medrol, morphine. -After the abdominal pain subsided completely, we have restarted infusion.  He developed periumbilical pain again.  We stopped his infusion. -Further work-up including EKG were within normal limits.  I plan to rechallenge with rituximab in 1 week. -he will come back tomorrow to proceed with his chemotherapy.

## 2019-05-17 NOTE — Patient Instructions (Addendum)
[facility] at [facility] Discharge Instructions  You were seen today by Dr. Katragadda. He went over your recent lab results. He will see you back in 1 week for labs and follow up.   Thank you for choosing [facility] at [facility] to provide your oncology and hematology care.  To afford each patient quality time with our provider, please arrive at least 15 minutes before your scheduled appointment time.   If you have a lab appointment with the Cancer Center please come in thru the  Main Entrance and check in at the main information desk  You need to re-schedule your appointment should you arrive 10 or more minutes late.  We strive to give you quality time with our providers, and arriving late affects you and other patients whose appointments are after yours.  Also, if you no show three or more times for appointments you may be dismissed from the clinic at the providers discretion.     Again, thank you for choosing [facility].  Our hope is that these requests will decrease the amount of time that you wait before being seen by our physicians.       _____________________________________________________________  Should you have questions after your visit to [facility], please contact our office at [phone] between the hours of 8:00 a.m. and 4:30 p.m.  Voicemails left after 4:00 p.m. will not be returned until the following business day.  For prescription refill requests, have your pharmacy contact our office and allow 72 hours.    Cancer Center Support Programs:   > Cancer Support Group  2nd Tuesday of the month 1pm-2pm, Journey Room    

## 2019-05-17 NOTE — Progress Notes (Signed)
[id number] Labs reviewed with and pt seen by Dr. Delton Coombes and pt approved for Rituxan and Cytoxan infusion as well as Magnesium 2 gram IV per MD                                             4388 Pt complained of upper abdominal pain that feels like pressure and soreness rated 5. Rituxan infusion,which had recently been increased to 150 mg/hr, was stopped. Vital signs stable( see flow sheet) Pt denied any other complaints.Skin warm and dry. Wife at bedside.                    [place] Dr. Delton Coombes at bedside. 1348 NS started at 500 ml/hr. 38 Pt continues to c/o pain.No further Rituxan to be given today and pt to come in tomorrow for Fulphila injection per Dr. Delton Coombes. 1405 Pt's pain level has increased to a 7. VSS (see flow sheet) Dr. Delton Coombes notified and pt given Morphine 2mg  IV per MD order.1435 Pt reports that his abdominal pain is now a 2 and he is resting comfortably. No other complaints voiced. 1500 VSS, Pt denies any pain at this and can be discharged per MD order.Pt discharged via wheelchair in satisfactory condition accompanied by his wife.

## 2019-05-17 NOTE — Progress Notes (Signed)
05/17/19  Received notice patient doing well with 100 mg test dose of Rituximab and can proceed with remaining 600 mg.  T.O. Cleta Alberts, RN/Jaquail Mclees Ronnald Ramp, PharmD

## 2019-05-17 NOTE — Patient Instructions (Addendum)
[facility] Discharge Instructions for Patients Receiving Chemotherapy   Beginning January 23rd 2017 lab work for the [facility] will be done in the  Main lab at [facility] on 1st floor. If you have a lab appointment with the [facility] please come in thru the  Main Entrance and check in at the main information desk   Today you received the following chemotherapy agents Cytoxan and Rituxan infusions as well as Magnesium infusion. Follow-up as scheduled. Call clinic for any questions or concerns  To help prevent nausea and vomiting after your treatment, we encourage you to take your nausea medication   If you develop nausea and vomiting, or diarrhea that is not controlled by your medication, call the clinic.  The clinic phone number is [phone]. Office hours are Monday-Friday 8:30am-5:00pm.  BELOW ARE SYMPTOMS THAT SHOULD BE REPORTED IMMEDIATELY:  *FEVER GREATER THAN 101.0 F  *CHILLS WITH OR WITHOUT FEVER  NAUSEA AND VOMITING THAT IS NOT CONTROLLED WITH YOUR NAUSEA MEDICATION  *UNUSUAL SHORTNESS OF BREATH  *UNUSUAL BRUISING OR BLEEDING  TENDERNESS IN MOUTH AND THROAT WITH OR WITHOUT PRESENCE OF ULCERS  *URINARY PROBLEMS  *BOWEL PROBLEMS  UNUSUAL RASH Items with * indicate a potential emergency and should be followed up as soon as possible. If you have an emergency after office hours please contact your primary care physician or go to the nearest emergency department.  Please call the clinic during office hours if you have any questions or concerns.   You may also contact the Patient Navigator at [phone] should you have any questions or need assistance in obtaining follow up care.      Resources For Cancer Patients and their Caregivers ? American Cancer Society: Can assist with transportation, wigs, general needs, runs Look Good Feel Better.        209-453-1151 ? Cancer Care: Provides financial assistance, online support groups,  medication/co-pay assistance.  1-800-813-HOPE 581-770-3298) ? [place] Assists [place] cancer patients and their families through emotional , educational and financial support.  318-046-3847 ? [place] DSS Where to apply for food stamps, Medicaid and utility assistance. 551 453 7204 ? [facility]: Transportation to medical appointments. 936 440 2089 ? Social Security Administration: May apply for disability if have a Stage IV cancer. (802)777-3779 (417)261-1446 ? [employer], Disability and Transit Services: Assists with nutrition, care and transit needs. ([phone]

## 2019-05-17 NOTE — Progress Notes (Signed)
[place], [place] [address]   CLINIC:  Medical Oncology/Hematology  PCP:  Loman [place], [address] [place] [address] [phone]   REASON FOR VISIT:  Follow-up for high-grade lymphoma.  CURRENT THERAPY: Dose adjusted R-EPOCH  BRIEF ONCOLOGIC HISTORY:  Oncology History  Diffuse large B-cell lymphoma of lymph nodes of multiple regions ([place])  04/26/2019 Initial Diagnosis   Diffuse large B-cell lymphoma of lymph nodes of multiple regions ([place])   05/10/2019 -  Chemotherapy   The patient had pegfilgrastim (NEULASTA ONPRO KIT) injection 6 mg, 6 mg, Subcutaneous, Once, 1 of 1 cycle pegfilgrastim-jmdb (FULPHILA) injection 6 mg, 6 mg, Subcutaneous,  Once, 1 of 4 cycles DOXOrubicin (ADRIAMYCIN) 20 mg, etoposide (VEPESID) 104 mg, vinCRIStine (ONCOVIN) 0.8 mg in sodium chloride 0.9 % 500 mL chemo infusion, , Intravenous, Once, 1 of 4 cycles Administration:  (05/11/2019),  (05/12/2019),  (05/13/2019),  (05/14/2019) ondansetron (ZOFRAN) 8 mg in sodium chloride 0.9 % 50 mL IVPB, , Intravenous,  Once, 1 of 4 cycles Administration: 8 mg (05/10/2019), 8 mg (05/11/2019), 16 mg (05/17/2019), 8 mg (05/12/2019), 8 mg (05/13/2019), 8 mg (05/14/2019) cyclophosphamide (CYTOXAN) 1,560 mg in sodium chloride 0.9 % 250 mL chemo infusion, 750 mg/m2 = 1,560 mg, Intravenous,  Once, 1 of 4 cycles Administration: 1,560 mg (05/17/2019) riTUXimab-pvvr (RUXIENCE) 800 mg in sodium chloride 0.9 % 250 mL (2.4242 mg/mL) infusion, 375 mg/m2 = 800 mg, Intravenous,  Once, 1 of 4 cycles Dose modification: 100 mg (original dose 375 mg/m2, Cycle 1, Reason: Other (see comments), Comment: test dose), 600 mg (original dose 375 mg/m2, Cycle 1, Reason: Other (see comments), Comment: due to previous reaction giving test dose first) Administration: 800 mg (05/10/2019), 100 mg (05/17/2019)  for chemotherapy treatment.    05/11/2019 - 05/11/2019 Chemotherapy   The patient had  DOXOrubicin (ADRIAMYCIN) chemo injection 104 mg, 50 mg/m2, Intravenous,  Once, 0 of 6 cycles palonosetron (ALOXI) injection 0.25 mg, 0.25 mg, Intravenous,  Once, 0 of 6 cycles pegfilgrastim-jmdb (FULPHILA) injection 6 mg, 6 mg, Subcutaneous,  Once, 0 of 6 cycles vinCRIStine (ONCOVIN) 2 mg in sodium chloride 0.9 % 50 mL chemo infusion, 2 mg, Intravenous,  Once, 0 of 6 cycles cyclophosphamide (CYTOXAN) 1,540 mg in sodium chloride 0.9 % 250 mL chemo infusion, 750 mg/m2, Intravenous,  Once, 0 of 6 cycles fosaprepitant (EMEND) 150 mg, dexamethasone (DECADRON) 12 mg in sodium chloride 0.9 % 145 mL IVPB, , Intravenous,  Once, 0 of 6 cycles riTUXimab-pvvr (RUXIENCE) 800 mg in sodium chloride 0.9 % 250 mL (2.4242 mg/mL) infusion, 375 mg/m2, Intravenous,  Once, 0 of 6 cycles  for chemotherapy treatment.       CANCER STAGING: Cancer Staging No matching staging information was found for the patient.   INTERVAL HISTORY:  Kevin [name] 63 y.o. male seen for follow-up of high-grade lymphoma and toxicity assessment.  He started cycle 1 last week.  He had a reaction with rituximab on last Monday.  We had to stop his infusion.  He did very well with chemotherapy and denied any nausea, vomiting, diarrhea or constipation.  No signs or symptoms of PND or orthopnea.  Mild fatigue reported.  No hematuria.  Appetite and energy levels are 50%.  Mild on and off numbness in the hands and feet reported with no persistent numbness.  Mild sleep problems.  Denies any fevers or night sweats.  Has been eating well.  REVIEW OF SYSTEMS:  Review of Systems  Constitutional: Positive for  fatigue.  Neurological: Positive for numbness.  Psychiatric/Behavioral: Positive for sleep disturbance.  All other systems reviewed and are negative.    PAST MEDICAL/SURGICAL HISTORY:  Past Medical History:  Diagnosis Date  . Diffuse large B cell lymphoma ([place]) 04/26/2019  . Hyperlipidemia 02/01/2014  . Hypertension   . Leg DVT (deep  venous thromboembolism), acute, bilateral ([place]) 04/18/2019  . Port-A-Cath in place 04/27/2019   Past Surgical History:  Procedure Laterality Date  . CYSTOSCOPY W/ URETERAL STENT PLACEMENT Bilateral 04/21/2019   Procedure: CYSTOSCOPY WITH RETROGRADE PYELOGRAM/URETERAL STENT PLACEMENT;  Surgeon: Cleon Gustin, MD;  Location: [facility];  Service: Urology;  Laterality: Bilateral;  . INGUINAL LYMPH NODE BIOPSY Right 04/21/2019   Procedure: INGUINAL LYMPH NODE BIOPSY;  Surgeon: Virl Cagey, MD;  Location: [facility];  Service: General;  Laterality: Right;  . PORTACATH PLACEMENT Left 05/07/2019   Procedure: INSERTION PORT-A-CATH (catheter left subclavian);  Surgeon: Virl Cagey, MD;  Location: [facility];  Service: General;  Laterality: Left;     SOCIAL HISTORY:  Social History   Socioeconomic History  . Marital status: Married    Spouse name: Not on file  . Number of children: Not on file  . Years of education: Not on file  . Highest education level: Not on file  Occupational History  . Not on file  Social Needs  . Financial resource strain: Not on file  . Food insecurity    Worry: Not on file    Inability: Not on file  . Transportation needs    Medical: Not on file    Non-medical: Not on file  Tobacco Use  . Smoking status: Former Smoker    Quit date: 04/27/1999    Years since quitting: 20.0  . Smokeless tobacco: Never Used  Substance and Sexual Activity  . Alcohol use: Yes    Alcohol/week: 56.0 standard drinks    Types: 56 Cans of beer per week  . Drug use: Never  . Sexual activity: Not on file  Lifestyle  . Physical activity    Days per week: Not on file    Minutes per session: Not on file  . Stress: Not on file  Relationships  . Social Herbalist on phone: Not on file    Gets together: Not on file    Attends religious service: Not on file    Active member of club or organization: Not on file    Attends meetings of clubs or organizations: Not on  file    Relationship status: Not on file  . Intimate partner violence    Fear of current or ex partner: Not on file    Emotionally abused: Not on file    Physically abused: Not on file    Forced sexual activity: Not on file  Other Topics Concern  . Not on file  Social History Narrative  . Not on file    FAMILY HISTORY:  Family History  Problem Relation Age of Onset  . Diabetes Brother   . Diabetes Mother   . Leukemia Brother   . Diabetes Son     CURRENT MEDICATIONS:  Outpatient Encounter Medications as of 05/17/2019  Medication Sig  . allopurinol (ZYLOPRIM) 300 MG tablet Take 1 tablet (300 mg total) by mouth daily.  Marland Kitchen amLODipine (NORVASC) 10 MG tablet Take 1 tablet (10 mg total) by mouth daily.  Marland Kitchen apixaban (ELIQUIS) 5 MG TABS tablet Take 1 tablet (5 mg total) by mouth 2 (two) times daily.  Marland Kitchen  CYCLOPHOSPHAMIDE IV Inject into the vein every 21 ( twenty-one) days.  Marland Kitchen DOXORUBICIN HCL IV Inject into the vein every 21 ( twenty-one) days.  . ETOPOSIDE IV Inject into the vein.  Marland Kitchen HYDROcodone-acetaminophen (NORCO/VICODIN) 5-325 MG tablet Take 1 tablet by mouth every 8 (eight) hours as needed for moderate pain.   Marland Kitchen losartan (COZAAR) 100 MG tablet Take 1 tablet (100 mg total) by mouth daily.  . metoprolol succinate (TOPROL-XL) 50 MG 24 hr tablet Take 1 tablet (50 mg total) by mouth daily.  . OXYGEN Inhale 2 L into the lungs 3 (three) times daily as needed (oxygen level below 93).   . prochlorperazine (COMPAZINE) 10 MG tablet Take 10 mg by mouth every 6 (six) hours as needed for nausea or vomiting.  Marland Kitchen RITUXIMAB IV Inject into the vein every 21 ( twenty-one) days.  . traMADol (ULTRAM) 50 MG tablet Take 1 tablet (50 mg total) by mouth every 6 (six) hours as needed for moderate pain or severe pain.  . vinCRIStine 2 mg in sodium chloride 0.9 % 50 mL Inject 2 mg into the vein every 21 ( twenty-one) days.   No facility-administered encounter medications on file as of 05/17/2019.      ALLERGIES:  No Known Allergies   PHYSICAL EXAM:  ECOG Performance status: 1  Vitals:   05/17/19 0834  BP: 139/69  Pulse: 68  Resp: 18  Temp: 98.7 F (37.1 C)  SpO2: 98%   Filed Weights   05/17/19 0834  Weight: 192 lb 14.4 oz (87.5 kg)    Physical Exam Vitals signs reviewed.  Constitutional:      Appearance: Normal appearance.  Cardiovascular:     Rate and Rhythm: Normal rate and regular rhythm.     Heart sounds: Normal heart sounds.  Pulmonary:     Effort: Pulmonary effort is normal.     Breath sounds: Normal breath sounds.  Abdominal:     General: There is no distension.     Palpations: Abdomen is soft. There is no mass.  Musculoskeletal:        General: No swelling.  Skin:    General: Skin is warm.  Neurological:     General: No focal deficit present.     Mental Status: He is alert and oriented to person, place, and time.  Psychiatric:        Mood and Affect: Mood normal.        Behavior: Behavior normal.    Bilateral inguinal adenopathy.  LABORATORY DATA:  I have reviewed the labs as listed.  CBC    Component Value Date/Time   WBC 7.6 05/17/2019 0900   RBC 4.18 (L) 05/17/2019 0900   HGB 12.5 (L) 05/17/2019 0900   HCT 38.0 (L) 05/17/2019 0900   PLT 197 05/17/2019 0900   MCV 90.9 05/17/2019 0900   MCH 29.9 05/17/2019 0900   MCHC 32.9 05/17/2019 0900   RDW 11.9 05/17/2019 0900   LYMPHSABS 1.1 05/17/2019 0900   MONOABS 0.0 (L) 05/17/2019 0900   EOSABS 0.4 05/17/2019 0900   BASOSABS 0.0 05/17/2019 0900   CMP Latest Ref Rng & Units 05/17/2019 05/14/2019 05/13/2019  Glucose 70 - 99 mg/dL 120(H) 126(H) 128(H)  BUN 8 - 23 mg/dL 17 30(H) 39(H)  Creatinine 0.61 - 1.24 mg/dL 0.55(L) 0.78 1.04  Sodium 135 - 145 mmol/L 140 143 142  Potassium 3.5 - 5.1 mmol/L 3.8 4.3 4.2  Chloride 98 - 111 mmol/L 100 107 106  CO2 22 - 32 mmol/L 30  27 26  Calcium 8.9 - 10.3 mg/dL 8.1(L) 8.1(L) 8.4(L)  Total Protein 6.5 - 8.1 g/dL - 5.6(L) -  Total Bilirubin 0.3 - 1.2  mg/dL - 0.9 -  Alkaline Phos 38 - 126 U/L - 48 -  AST 15 - 41 U/L - 16 -  ALT 0 - 44 U/L - 14 -       DIAGNOSTIC IMAGING:  I have independently reviewed the scans and discussed with the patient.     ASSESSMENT & PLAN:   Diffuse large B-cell lymphoma of lymph nodes of multiple regions ([place]) 1.  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 -PET scan on 05/03/2019 shows bulky intensely hypermetabolic peritoneal and retroperitoneal nodal mass, nodularity.  Additional hypermetabolic adenopathy involving the axilla, mediastinum, iliac and inguinal lymph nodes.  Multiple foci of discrete hypermetabolic metastases in the right iliac bone, left iliac crest, left and right scapula, and the C1 vertebral body. 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 -We had a prolonged conversation about aggressive nature of double hit lymphoma with high rates of recurrence. -He received cycle 1 of chemotherapy with EPOCH on 05/11/2019.  He received first dose of rituximab on 05/10/2019 and had reaction with abdominal pain.  We have rechallenged him and he again developed abdominal pain.  Hence we abandoned any further treatment on that day. -He felt better after cycle 1 of treatment.  He denies any hematuria.  Denied any chemotherapy induced side effects including nausea, vomiting or diarrhea.  No neuropathy reported. -I have reviewed his labs including tumor lysis labs.  He has hypomagnesemia with magnesium of 1.5.  He will receive 2 g of IV magnesium today. -He will receive cyclophosphamide today.  We will also rechallenge him with rituximab 100 mg infusion.  We will give the rest of the dose if he tolerates the first dose.  I will reevaluate him with weekly labs. -He will come back tomorrow for Neulasta injection.  2.  Bilateral leg DVT: -Ultrasound Doppler on 04/18/2019 + for  bilateral leg DVT. -She is continuing Eliquis without any problems.  3.  Right hydronephrosis: -Cystoscopy and bilateral JJ stent placement on 04/21/2019.  He continues to have mild hematuria.  Addendum: -I was called by the nurses.  Towards the end of his test dose of 100 mg of rituximab, when he was receiving infusion at rate of 150 mg/h, he developed mild epigastric pain.  He reported as tightness.  He did receive premedication with Benadryl, Pepcid, morphine 2 mg, dexamethasone and antinausea medication. -We have given another dose of morphine 2 mg.  We have abandoned any further treatment with rituximab during this cycle. -I plan to treat him again with cycle 2.    Total time spent is 40 minutes with more than 70% of the time spent face-to-face discussing treatment plan, attending infusion reaction and monitoring him more than 3 times today.  Orders placed this encounter:  No orders of the defined types were placed in this encounter.     Derek Jack, MD [place] [phone]

## 2019-05-18 ENCOUNTER — Inpatient Hospital Stay (HOSPITAL_COMMUNITY): Payer: BLUE CROSS/BLUE SHIELD

## 2019-05-18 ENCOUNTER — Encounter (HOSPITAL_COMMUNITY): Payer: Self-pay

## 2019-05-18 ENCOUNTER — Other Ambulatory Visit: Payer: Self-pay

## 2019-05-18 VITALS — BP 143/57 | HR 63 | Temp 98.2°F | Resp 18

## 2019-05-18 DIAGNOSIS — Z5112 Encounter for antineoplastic immunotherapy: Secondary | ICD-10-CM | POA: Diagnosis not present

## 2019-05-18 DIAGNOSIS — C8338 Diffuse large B-cell lymphoma, lymph nodes of multiple sites: Secondary | ICD-10-CM | POA: Diagnosis not present

## 2019-05-18 DIAGNOSIS — Z95828 Presence of other vascular implants and grafts: Secondary | ICD-10-CM

## 2019-05-18 DIAGNOSIS — Z5189 Encounter for other specified aftercare: Secondary | ICD-10-CM | POA: Diagnosis not present

## 2019-05-18 DIAGNOSIS — Z5111 Encounter for antineoplastic chemotherapy: Secondary | ICD-10-CM | POA: Diagnosis not present

## 2019-05-18 MED ORDER — PEGFILGRASTIM-JMDB 6 MG/0.6ML ~~LOC~~ SOSY
6.0000 mg | PREFILLED_SYRINGE | Freq: Once | SUBCUTANEOUS | Status: AC
  Administered 2019-05-18: 6 mg via SUBCUTANEOUS
  Filled 2019-05-18: qty 0.6

## 2019-05-18 NOTE — Progress Notes (Signed)
[name] tolerated Fulphila injection well without complaints or incident.Reviewed purpose and side effects of this medication with pt and his daughter who verbalized understanding. VSS 24 HR F/U Pt denied any pain,dyspnea or other issues since receiving chemotherapy yesterday. Pt reminded to call for any problems. Pt discharged self ambulatory in satisfactory condition accompanied by hisdaughter

## 2019-05-18 NOTE — Patient Instructions (Signed)
[facility] at [facility] Discharge Instructions  Received Fulphila injection today. Follow-up as scheduled. Call clinic for any questions or concerns   Thank you for choosing [facility] at [facility] to provide your oncology and hematology care.  To afford each patient quality time with our provider, please arrive at least 15 minutes before your scheduled appointment time.   If you have a lab appointment with the Cancer Center please come in thru the Main Entrance and check in at the main information desk.  You need to re-schedule your appointment should you arrive 10 or more minutes late.  We strive to give you quality time with our providers, and arriving late affects you and other patients whose appointments are after yours.  Also, if you no show three or more times for appointments you may be dismissed from the clinic at the providers discretion.     Again, thank you for choosing [facility].  Our hope is that these requests will decrease the amount of time that you wait before being seen by our physicians.       _____________________________________________________________  Should you have questions after your visit to [facility], please contact our office at [phone] between the hours of 8:00 a.m. and 4:30 p.m.  Voicemails left after 4:00 p.m. will not be returned until the following business day.  For prescription refill requests, have your pharmacy contact our office and allow 72 hours.    Due to Covid, you will need to wear a mask upon entering the hospital. If you do not have a mask, a mask will be given to you at the Main Entrance upon arrival. For doctor visits, patients may have 1 support person with them. For treatment visits, patients can not have anyone with them due to social distancing guidelines and our immunocompromised population.     

## 2019-05-19 ENCOUNTER — Ambulatory Visit (HOSPITAL_COMMUNITY): Payer: BLUE CROSS/BLUE SHIELD

## 2019-05-23 DIAGNOSIS — J9601 Acute respiratory failure with hypoxia: Secondary | ICD-10-CM | POA: Diagnosis not present

## 2019-05-23 DIAGNOSIS — I272 Pulmonary hypertension, unspecified: Secondary | ICD-10-CM | POA: Diagnosis not present

## 2019-05-23 NOTE — Progress Notes (Deleted)
Assessment & Plan:  ***  No follow-ups on file.  Hendricks Limes, MSN, APRN, FNP-C [facility]  Subjective:    Patient ID: Kevin [name], male    DOB: 10-Oct-1955, 62 y.o.   MRN: [id number]  Patient Care Team: Loman Brooklyn, FNP as PCP - General (Family Medicine)   Chief Complaint: No chief complaint on file.   HPI: Kevin [name] is a 63 y.o. male presenting on 05/26/2019 for No chief complaint on file.  Hypertension: Patient here for follow-up of elevated blood pressure.  He was seen approximately 4 weeks ago at which time his BP was elevated.  At that time he was in need of refills of his medication as he had run out. He {is/is not:9024} exercising and {is/is not:9024} adherent to low salt diet.  Blood pressure {is/is not:9024} well controlled at home. Cardiac symptoms {Symptoms; cardiac:12860}. Patient denies {Symptoms; cardiac:12860}.  Cardiovascular risk factors: advanced age (older than 74 for men, 69 for women), dyslipidemia, hypertension, male gender and obesity (BMI >= 30 kg/m2). Use of agents associated with hypertension: none. History of target organ damage: none.   New complaints: ***  Social history:  Relevant past medical, surgical, family and social history reviewed and updated as indicated. Interim medical history since our last visit reviewed.  Allergies and medications reviewed and updated.  DATA REVIEWED: CHART IN EPIC  ROS: Negative unless specifically indicated above in HPI.    Current Outpatient Medications:  .  allopurinol (ZYLOPRIM) 300 MG tablet, Take 1 tablet (300 mg total) by mouth daily., Disp: 30 tablet, Rfl: 3 .  amLODipine (NORVASC) 10 MG tablet, Take 1 tablet (10 mg total) by mouth daily., Disp: 90 tablet, Rfl: 1 .  apixaban (ELIQUIS) 5 MG TABS tablet, Take 1 tablet (5 mg total) by mouth 2 (two) times daily., Disp: 60 tablet, Rfl: 3 .  CYCLOPHOSPHAMIDE IV, Inject into the vein every 21 ( twenty-one) days., Disp: ,  Rfl:  .  DOXORUBICIN HCL IV, Inject into the vein every 21 ( twenty-one) days., Disp: , Rfl:  .  ETOPOSIDE IV, Inject into the vein., Disp: , Rfl:  .  HYDROcodone-acetaminophen (NORCO/VICODIN) 5-325 MG tablet, Take 1 tablet by mouth every 8 (eight) hours as needed for moderate pain. , Disp: , Rfl:  .  losartan (COZAAR) 100 MG tablet, Take 1 tablet (100 mg total) by mouth daily., Disp: 90 tablet, Rfl: 1 .  metoprolol succinate (TOPROL-XL) 50 MG 24 hr tablet, Take 1 tablet (50 mg total) by mouth daily., Disp: 90 tablet, Rfl: 1 .  OXYGEN, Inhale 2 L into the lungs 3 (three) times daily as needed (oxygen level below 93). , Disp: , Rfl:  .  prochlorperazine (COMPAZINE) 10 MG tablet, Take 10 mg by mouth every 6 (six) hours as needed for nausea or vomiting., Disp: , Rfl:  .  RITUXIMAB IV, Inject into the vein every 21 ( twenty-one) days., Disp: , Rfl:  .  traMADol (ULTRAM) 50 MG tablet, Take 1 tablet (50 mg total) by mouth every 6 (six) hours as needed for moderate pain or severe pain., Disp: 15 tablet, Rfl: 0 .  vinCRIStine 2 mg in sodium chloride 0.9 % 50 mL, Inject 2 mg into the vein every 21 ( twenty-one) days., Disp: , Rfl:    No Known Allergies Past Medical History:  Diagnosis Date  . Diffuse large B cell lymphoma ([place]) 04/26/2019  . Hyperlipidemia 02/01/2014  . Hypertension   . Leg DVT (deep venous  thromboembolism), acute, bilateral ([place]) 04/18/2019  . Port-A-Cath in place 04/27/2019    Past Surgical History:  Procedure Laterality Date  . CYSTOSCOPY W/ URETERAL STENT PLACEMENT Bilateral 04/21/2019   Procedure: CYSTOSCOPY WITH RETROGRADE PYELOGRAM/URETERAL STENT PLACEMENT;  Surgeon: Cleon Gustin, MD;  Location: [facility];  Service: Urology;  Laterality: Bilateral;  . INGUINAL LYMPH NODE BIOPSY Right 04/21/2019   Procedure: INGUINAL LYMPH NODE BIOPSY;  Surgeon: Virl Cagey, MD;  Location: [facility];  Service: General;  Laterality: Right;  . PORTACATH PLACEMENT Left 05/07/2019    Procedure: INSERTION PORT-A-CATH (catheter left subclavian);  Surgeon: Virl Cagey, MD;  Location: [facility];  Service: General;  Laterality: Left;    Social History   Socioeconomic History  . Marital status: Married    Spouse name: Not on file  . Number of children: Not on file  . Years of education: Not on file  . Highest education level: Not on file  Occupational History  . Not on file  Social Needs  . Financial resource strain: Not on file  . Food insecurity    Worry: Not on file    Inability: Not on file  . Transportation needs    Medical: Not on file    Non-medical: Not on file  Tobacco Use  . Smoking status: Former Smoker    Quit date: 04/27/1999    Years since quitting: 20.0  . Smokeless tobacco: Never Used  Substance and Sexual Activity  . Alcohol use: Yes    Alcohol/week: 56.0 standard drinks    Types: 56 Cans of beer per week  . Drug use: Never  . Sexual activity: Not on file  Lifestyle  . Physical activity    Days per week: Not on file    Minutes per session: Not on file  . Stress: Not on file  Relationships  . Social Herbalist on phone: Not on file    Gets together: Not on file    Attends religious service: Not on file    Active member of club or organization: Not on file    Attends meetings of clubs or organizations: Not on file    Relationship status: Not on file  . Intimate partner violence    Fear of current or ex partner: Not on file    Emotionally abused: Not on file    Physically abused: Not on file    Forced sexual activity: Not on file  Other Topics Concern  . Not on file  Social History Narrative  . Not on file        Objective:    There were no vitals taken for this visit.  Physical Exam  No results found for: TSH Lab Results  Component Value Date   WBC 7.6 05/17/2019   HGB 12.5 (L) 05/17/2019   HCT 38.0 (L) 05/17/2019   MCV 90.9 05/17/2019   PLT 197 05/17/2019   Lab Results  Component Value Date   NA 140  05/17/2019   K 3.8 05/17/2019   CO2 30 05/17/2019   GLUCOSE 120 (H) 05/17/2019   BUN 17 05/17/2019   CREATININE 0.55 (L) 05/17/2019   BILITOT 0.9 05/14/2019   ALKPHOS 48 05/14/2019   AST 16 05/14/2019   ALT 14 05/14/2019   PROT 5.6 (L) 05/14/2019   ALBUMIN 2.9 (L) 05/14/2019   CALCIUM 8.1 (L) 05/17/2019   ANIONGAP 10 05/17/2019   No results found for: CHOL No results found for: HDL No results found for:  [place] Lab Results  Component Value Date   TRIG 181 (H) 04/17/2019   No results found for: CHOLHDL No results found for: HGBA1C

## 2019-05-25 ENCOUNTER — Inpatient Hospital Stay (HOSPITAL_BASED_OUTPATIENT_CLINIC_OR_DEPARTMENT_OTHER): Payer: BLUE CROSS/BLUE SHIELD | Admitting: Hematology

## 2019-05-25 ENCOUNTER — Inpatient Hospital Stay (HOSPITAL_COMMUNITY): Payer: BLUE CROSS/BLUE SHIELD

## 2019-05-25 ENCOUNTER — Other Ambulatory Visit: Payer: Self-pay

## 2019-05-25 ENCOUNTER — Encounter (HOSPITAL_COMMUNITY): Payer: Self-pay | Admitting: Hematology

## 2019-05-25 DIAGNOSIS — C8338 Diffuse large B-cell lymphoma, lymph nodes of multiple sites: Secondary | ICD-10-CM

## 2019-05-25 DIAGNOSIS — N179 Acute kidney failure, unspecified: Secondary | ICD-10-CM

## 2019-05-25 DIAGNOSIS — Z5189 Encounter for other specified aftercare: Secondary | ICD-10-CM | POA: Diagnosis not present

## 2019-05-25 DIAGNOSIS — Z5111 Encounter for antineoplastic chemotherapy: Secondary | ICD-10-CM | POA: Diagnosis not present

## 2019-05-25 DIAGNOSIS — Z5112 Encounter for antineoplastic immunotherapy: Secondary | ICD-10-CM | POA: Diagnosis not present

## 2019-05-25 LAB — CBC WITH DIFFERENTIAL/PLATELET
Abs Immature Granulocytes: 2.8 10*3/uL — ABNORMAL HIGH (ref 0.00–0.07)
Basophils Absolute: 0.1 10*3/uL (ref 0.0–0.1)
Basophils Relative: 0 %
Eosinophils Absolute: 0 10*3/uL (ref 0.0–0.5)
Eosinophils Relative: 0 %
HCT: 31.5 % — ABNORMAL LOW (ref 39.0–52.0)
Hemoglobin: 10.3 g/dL — ABNORMAL LOW (ref 13.0–17.0)
Immature Granulocytes: 16 %
Lymphocytes Relative: 8 %
Lymphs Abs: 1.4 10*3/uL (ref 0.7–4.0)
MCH: 29.3 pg (ref 26.0–34.0)
MCHC: 32.7 g/dL (ref 30.0–36.0)
MCV: 89.5 fL (ref 80.0–100.0)
Monocytes Absolute: 2.5 10*3/uL — ABNORMAL HIGH (ref 0.1–1.0)
Monocytes Relative: 14 %
Neutro Abs: 10.5 10*3/uL — ABNORMAL HIGH (ref 1.7–7.7)
Neutrophils Relative %: 62 %
Platelets: 136 10*3/uL — ABNORMAL LOW (ref 150–400)
RBC: 3.52 MIL/uL — ABNORMAL LOW (ref 4.22–5.81)
RDW: 12 % (ref 11.5–15.5)
WBC: 17.3 10*3/uL — ABNORMAL HIGH (ref 4.0–10.5)
nRBC: 0 % (ref 0.0–0.2)

## 2019-05-25 LAB — PHOSPHORUS: Phosphorus: 1.4 mg/dL — ABNORMAL LOW (ref 2.5–4.6)

## 2019-05-25 LAB — COMPREHENSIVE METABOLIC PANEL
ALT: 20 U/L (ref 0–44)
AST: 16 U/L (ref 15–41)
Albumin: 2.3 g/dL — ABNORMAL LOW (ref 3.5–5.0)
Alkaline Phosphatase: 75 U/L (ref 38–126)
Anion gap: 7 (ref 5–15)
BUN: 16 mg/dL (ref 8–23)
CO2: 27 mmol/L (ref 22–32)
Calcium: 7.6 mg/dL — ABNORMAL LOW (ref 8.9–10.3)
Chloride: 101 mmol/L (ref 98–111)
Creatinine, Ser: 0.85 mg/dL (ref 0.61–1.24)
GFR calc Af Amer: >60 mL/min (ref >60)
GFR calc non Af Amer: >60 mL/min (ref >60)
Glucose, Bld: 109 mg/dL — ABNORMAL HIGH (ref 70–99)
Potassium: 3.2 mmol/L — ABNORMAL LOW (ref 3.5–5.1)
Sodium: 135 mmol/L (ref 135–145)
Total Bilirubin: 0.3 mg/dL (ref 0.3–1.2)
Total Protein: 5.1 g/dL — ABNORMAL LOW (ref 6.5–8.1)

## 2019-05-25 LAB — URIC ACID: Uric Acid, Serum: 3.2 mg/dL — ABNORMAL LOW (ref 3.7–8.6)

## 2019-05-25 LAB — MAGNESIUM: Magnesium: 2.2 mg/dL (ref 1.7–2.4)

## 2019-05-25 MED ORDER — K-PHOS-NEUTRAL 155-852-130 MG PO TABS: 1.0000 | ORAL_TABLET | Freq: Three times a day (TID) | ORAL | 0 refills | Status: DC

## 2019-05-25 MED ORDER — HEPARIN SOD (PORK) LOCK FLUSH 100 UNIT/ML IV SOLN: 500.0000 [IU] | Freq: Once | INTRAVENOUS | Status: DC | PRN

## 2019-05-25 MED ORDER — SODIUM CHLORIDE 0.9 % IV SOLN
Freq: Once | INTRAVENOUS | Status: AC
  Administered 2019-05-25: 10:00:00 via INTRAVENOUS
  Filled 2019-05-25: qty 1000

## 2019-05-25 MED ORDER — SODIUM CHLORIDE 0.9% FLUSH
10.0000 mL | Freq: Once | INTRAVENOUS | Status: AC | PRN
  Administered 2019-05-25: 10 mL

## 2019-05-25 NOTE — Patient Instructions (Addendum)
[place] at [facility] Discharge Instructions  You were seen today by Dr. Delton Coombes. He went over your recent lab results. He will give you fluids today. He has sent in a new prescription for neutraphos to your pharmacy. He will see you back as scheduled for labs, treatment and follow up.   Thank you for choosing [place] at [facility] to provide your oncology and hematology care.  To afford each patient quality time with our provider, please arrive at least 15 minutes before your scheduled appointment time.   If you have a lab appointment with the [facility] please come in thru the  Main Entrance and check in at the main information desk  You need to re-schedule your appointment should you arrive 10 or more minutes late.  We strive to give you quality time with our providers, and arriving late affects you and other patients whose appointments are after yours.  Also, if you no show three or more times for appointments you may be dismissed from the clinic at the providers discretion.     Again, thank you for choosing [facility].  Our hope is that these requests will decrease the amount of time that you wait before being seen by our physicians.       _____________________________________________________________  Should you have questions after your visit to [facility], please contact our office at [phone] between the hours of 8:00 a.m. and 4:30 p.m.  Voicemails left after 4:00 p.m. will not be returned until the following business day.  For prescription refill requests, have your pharmacy contact our office and allow 72 hours.    Cancer Center Support Programs:   > Cancer Support Group  2nd Tuesday of the month 1pm-2pm, Journey Room

## 2019-05-25 NOTE — Progress Notes (Signed)
Hydration fluids given today per orders. Patient tolerated it well without problems. Vitals stable and discharged home from clinic ambulatory. Follow up as scheduled. 

## 2019-05-25 NOTE — Assessment & Plan Note (Signed)
1.  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 -PET scan on 05/03/2019 shows bulky intensely hypermetabolic peritoneal and retroperitoneal nodal mass, nodularity.  Additional hypermetabolic adenopathy involving the axilla, mediastinum, iliac and inguinal lymph nodes.  Multiple foci of discrete hypermetabolic metastases in the right iliac bone, left iliac crest, left and right scapula, and the C1 vertebral body. 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 -We had a prolonged conversation about aggressive nature of double hit lymphoma with high rates of recurrence. -Cycle 1 of dose adjusted R-EPOCH on 05/11/2019. -He did not receive complete dose of rituximab because of reactions on 2 different occasions.  Looks like he is having a reaction once we increase above a certain rate.  Reaction is abdominal pain. -Today he is seen with his daughter.  He reported vague abdominal discomfort and fullness.  As result of fullness, he is not able to eat much.  He lost about 25 pounds in the last 2 weeks. -I have reviewed his labs which showed some electrolyte abnormalities.  His creatinine has also gone up even though it remains in the normal range.  We will give him hydration today and tomorrow as he is not drinking enough fluids. -I will reevaluate him Monday for his cycle 2.  We plan to give him rituximab on Monday.  2.  Bilateral leg DVT: -Ultrasound Doppler on 04/18/2019 + for bilateral leg DVT. -She is continuing Eliquis without any problems.  3.  Right hydronephrosis: -Cystoscopy and bilateral JJ stent placement on 04/21/2019.  He continues to have mild hematuria.  4.  Weight loss: -He lost about 25 pounds since start of cycle 1 on 05/11/2019. -He is not eating much because of fullness in his abdomen.  He was told to drink 5 cans of boost plus every day.  He is currently  drinking 2 cans of regular boost.  Is not eating much of solid foods.  5.  Hypophosphatemia: -His phosphate level today is 1.4. -We will start him on Neutra-Phos 3 times a day.  I have sent a prescription.

## 2019-05-25 NOTE — Progress Notes (Signed)
Kevin [name], Kevin [name] [id number]   CLINIC:  Medical Oncology/Hematology  PCP:  Loman Brooklyn, [place] [address] [phone]   REASON FOR VISIT:  Follow-up for high-grade lymphoma.  CURRENT THERAPY: Dose adjusted R-EPOCH  BRIEF ONCOLOGIC HISTORY:  Oncology History  Diffuse large B-cell lymphoma of lymph nodes of multiple regions ([place])  04/26/2019 Initial Diagnosis   Diffuse large B-cell lymphoma of lymph nodes of multiple regions ([place] [address] [place])   05/10/2019 -  Chemotherapy   The patient had pegfilgrastim (NEULASTA ONPRO KIT) injection 6 mg, 6 mg, Subcutaneous, Once, 1 of 1 cycle pegfilgrastim-jmdb (FULPHILA) injection 6 mg, 6 mg, Subcutaneous,  Once, 1 of 4 cycles DOXOrubicin (ADRIAMYCIN) 20 mg, etoposide (VEPESID) 104 mg, vinCRIStine (ONCOVIN) 0.8 mg in sodium chloride 0.9 % 500 mL chemo infusion, , Intravenous, Once, 1 of 4 cycles Administration:  (05/11/2019),  (05/12/2019),  (05/13/2019),  (05/14/2019) ondansetron (ZOFRAN) 8 mg in sodium chloride 0.9 % 50 mL IVPB, , Intravenous,  Once, 1 of 4 cycles Administration: 8 mg (05/10/2019), 8 mg (05/11/2019), 16 mg (05/17/2019), 8 mg (05/12/2019), 8 mg (05/13/2019), 8 mg (05/14/2019) cyclophosphamide (CYTOXAN) 1,560 mg in sodium chloride 0.9 % 250 mL chemo infusion, 750 mg/m2 = 1,560 mg, Intravenous,  Once, 1 of 4 cycles Administration: 1,560 mg (05/17/2019) riTUXimab-pvvr (RUXIENCE) 800 mg in sodium chloride 0.9 % 250 mL (2.4242 mg/mL) infusion, 375 mg/m2 = 800 mg, Intravenous,  Once, 1 of 4 cycles Dose modification: 100 mg (original dose 375 mg/m2, Cycle 1, Reason: Other (see comments), Comment: test dose), 600 mg (original dose 375 mg/m2, Cycle 1, Reason: Other (see comments), Comment: due to previous reaction giving test dose first) Administration: 800 mg (05/10/2019), 100 mg (05/17/2019)  for chemotherapy treatment.    05/11/2019 - 05/11/2019 Chemotherapy   The patient had  DOXOrubicin (ADRIAMYCIN) chemo injection 104 mg, 50 mg/m2, Intravenous,  Once, 0 of 6 cycles palonosetron (ALOXI) injection 0.25 mg, 0.25 mg, Intravenous,  Once, 0 of 6 cycles pegfilgrastim-jmdb (FULPHILA) injection 6 mg, 6 mg, Subcutaneous,  Once, 0 of 6 cycles vinCRIStine (ONCOVIN) 2 mg in sodium chloride 0.9 % 50 mL chemo infusion, 2 mg, Intravenous,  Once, 0 of 6 cycles cyclophosphamide (CYTOXAN) 1,540 mg in sodium chloride 0.9 % 250 mL chemo infusion, 750 mg/m2, Intravenous,  Once, 0 of 6 cycles fosaprepitant (EMEND) 150 mg, dexamethasone (DECADRON) 12 mg in sodium chloride 0.9 % 145 mL IVPB, , Intravenous,  Once, 0 of 6 cycles riTUXimab-pvvr (RUXIENCE) 800 mg in sodium chloride 0.9 % 250 mL (2.4242 mg/mL) infusion, 375 mg/m2, Intravenous,  Once, 0 of 6 cycles  for chemotherapy treatment.       CANCER STAGING: Cancer Staging No matching staging information was found for the patient.   INTERVAL HISTORY:  [name] 63 y.o. male seen for follow-up of high-grade lymphoma.  His chemotherapy was 2 weeks ago.  He reported fullness and discomfort in the abdomen.  He is not sure if it is pain.  He does not have any appetite and feels that abdomen is full all the time.  No energy levels although he is trying to walk.  Denies any bleeding per rectum or melena.  Urine is occasionally pink since the stent placement and has been stable.  Denies any fevers or chills.  REVIEW OF SYSTEMS:  Review of Systems  Constitutional: Positive for fatigue and unexpected weight change.  Genitourinary: Positive for hematuria.   Psychiatric/Behavioral: Positive for sleep disturbance.  All  other systems reviewed and are negative.    PAST MEDICAL/SURGICAL HISTORY:  Past Medical History:  Diagnosis Date  . Diffuse large B cell lymphoma ([place]) 04/26/2019  . Hyperlipidemia 02/01/2014  . Hypertension   . Leg DVT (deep venous thromboembolism), acute, bilateral ([place]) 04/18/2019  . Port-A-Cath in place 04/27/2019    Past Surgical History:  Procedure Laterality Date  . CYSTOSCOPY W/ URETERAL STENT PLACEMENT Bilateral 04/21/2019   Procedure: CYSTOSCOPY WITH RETROGRADE PYELOGRAM/URETERAL STENT PLACEMENT;  Surgeon: Cleon Gustin, MD;  Location: [facility];  Service: Urology;  Laterality: Bilateral;  . INGUINAL LYMPH NODE BIOPSY Right 04/21/2019   Procedure: INGUINAL LYMPH NODE BIOPSY;  Surgeon: Virl Cagey, MD;  Location: [facility];  Service: General;  Laterality: Right;  . PORTACATH PLACEMENT Left 05/07/2019   Procedure: INSERTION PORT-A-CATH (catheter left subclavian);  Surgeon: Virl Cagey, MD;  Location: [facility];  Service: General;  Laterality: Left;     SOCIAL HISTORY:  Social History   Socioeconomic History  . Marital status: Married    Spouse name: Not on file  . Number of children: Not on file  . Years of education: Not on file  . Highest education level: Not on file  Occupational History  . Not on file  Social Needs  . Financial resource strain: Not on file  . Food insecurity    Worry: Not on file    Inability: Not on file  . Transportation needs    Medical: Not on file    Non-medical: Not on file  Tobacco Use  . Smoking status: Former Smoker    Quit date: 04/27/1999    Years since quitting: 20.0  . Smokeless tobacco: Never Used  Substance and Sexual Activity  . Alcohol use: Yes    Alcohol/week: 56.0 standard drinks    Types: 56 Cans of beer per week  . Drug use: Never  . Sexual activity: Not on file  Lifestyle  . Physical activity    Days per week: Not on file    Minutes per session: Not on file  . Stress: Not on file  Relationships  . Social Herbalist on phone: Not on file    Gets together: Not on file    Attends religious service: Not on file    Active member of club or organization: Not on file    Attends meetings of clubs or organizations: Not on file    Relationship status: Not on file  . Intimate partner violence    Fear of current or ex  partner: Not on file    Emotionally abused: Not on file    Physically abused: Not on file    Forced sexual activity: Not on file  Other Topics Concern  . Not on file  Social History Narrative  . Not on file    FAMILY HISTORY:  Family History  Problem Relation Age of Onset  . Diabetes Brother   . Diabetes Mother   . Leukemia Brother   . Diabetes Son     CURRENT MEDICATIONS:  Outpatient Encounter Medications as of 05/25/2019  Medication Sig  . docusate sodium (COLACE) 100 MG capsule Take 100 mg by mouth 2 (two) times daily.  Marland Kitchen allopurinol (ZYLOPRIM) 300 MG tablet Take 1 tablet (300 mg total) by mouth daily.  Marland Kitchen amLODipine (NORVASC) 10 MG tablet Take 1 tablet (10 mg total) by mouth daily.  Marland Kitchen apixaban (ELIQUIS) 5 MG TABS tablet Take 1 tablet (5 mg total) by mouth 2 (  two) times daily.  . CYCLOPHOSPHAMIDE IV Inject into the vein every 21 ( twenty-one) days.  Marland Kitchen DOXORUBICIN HCL IV Inject into the vein every 21 ( twenty-one) days.  . ETOPOSIDE IV Inject into the vein.  Marland Kitchen HYDROcodone-acetaminophen (NORCO/VICODIN) 5-325 MG tablet Take 1 tablet by mouth every 8 (eight) hours as needed for moderate pain.   . K Phos Mono-Sod Phos Di & Mono (K-PHOS-NEUTRAL) [id number] MG TABS Take 1 tablet by mouth 3 (three) times daily.  Marland Kitchen losartan (COZAAR) 100 MG tablet Take 1 tablet (100 mg total) by mouth daily.  . metoprolol succinate (TOPROL-XL) 50 MG 24 hr tablet Take 1 tablet (50 mg total) by mouth daily.  . OXYGEN Inhale 2 L into the lungs 3 (three) times daily as needed (oxygen level below 93).   . prochlorperazine (COMPAZINE) 10 MG tablet Take 10 mg by mouth every 6 (six) hours as needed for nausea or vomiting.  Marland Kitchen RITUXIMAB IV Inject into the vein every 21 ( twenty-one) days.  . traMADol (ULTRAM) 50 MG tablet Take 1 tablet (50 mg total) by mouth every 6 (six) hours as needed for moderate pain or severe pain. (Patient not taking: Reported on 05/25/2019)  . vinCRIStine 2 mg in sodium chloride 0.9 % 50  mL Inject 2 mg into the vein every 21 ( twenty-one) days.   No facility-administered encounter medications on file as of 05/25/2019.     ALLERGIES:  No Known Allergies   PHYSICAL EXAM:  ECOG Performance status: 1  Vitals:   05/25/19 0855  BP: 110/80  Pulse: 84  Resp: 16  Temp: (!) 97.1 F (36.2 C)  SpO2: 99%   Filed Weights   05/25/19 0855  Weight: 179 lb 14.4 oz (81.6 kg)    Physical Exam Vitals signs reviewed.  Constitutional:      Appearance: Normal appearance.  Cardiovascular:     Rate and Rhythm: Normal rate and regular rhythm.     Heart sounds: Normal heart sounds.  Pulmonary:     Effort: Pulmonary effort is normal.     Breath sounds: Normal breath sounds.  Abdominal:     General: There is no distension.     Palpations: Abdomen is soft. There is no mass.  Musculoskeletal:        General: No swelling.  Skin:    General: Skin is warm.  Neurological:     General: No focal deficit present.     Mental Status: He is alert and oriented to person, place, and time.  Psychiatric:        Mood and Affect: Mood normal.        Behavior: Behavior normal.    Bilateral inguinal adenopathy.  LABORATORY DATA:  I have reviewed the labs as listed.  CBC    Component Value Date/Time   WBC 17.3 (H) 05/25/2019 0826   RBC 3.52 (L) 05/25/2019 0826   HGB 10.3 (L) 05/25/2019 0826   HCT 31.5 (L) 05/25/2019 0826   PLT 136 (L) 05/25/2019 0826   MCV 89.5 05/25/2019 0826   MCH 29.3 05/25/2019 0826   MCHC 32.7 05/25/2019 0826   RDW 12.0 05/25/2019 0826   LYMPHSABS 1.4 05/25/2019 0826   MONOABS 2.5 (H) 05/25/2019 0826   EOSABS 0.0 05/25/2019 0826   BASOSABS 0.1 05/25/2019 0826   CMP Latest Ref Rng & Units 05/25/2019 05/17/2019 05/14/2019  Glucose 70 - 99 mg/dL 109(H) 120(H) 126(H)  BUN 8 - 23 mg/dL 16 17 30(H)  Creatinine 0.61 - 1.24 mg/dL 0.85  0.55(L) 0.78  Sodium 135 - 145 mmol/L 135 140 143  Potassium 3.5 - 5.1 mmol/L 3.2(L) 3.8 4.3  Chloride 98 - 111 mmol/L 101 100  107  CO2 22 - 32 mmol/L _0 Calcium 8.9 - 10.3 mg/dL 7.6(L) 8.1(L) 8.1(L)  Total Protein 6.5 - 8.1 g/dL 5.1(L) - 5.6(L)  Total Bilirubin 0.3 - 1.2 mg/dL 0.3 - 0.9  Alkaline Phos 38 - 126 U/L 75 - 48  AST 15 - 41 U/L 16 - 16  ALT 0 - 44 U/L 20 - 14       DIAGNOSTIC IMAGING:  I have independently reviewed the scans and discussed with the patient.     ASSESSMENT & PLAN:   Diffuse large B-cell lymphoma of lymph nodes of multiple regions ([place]) 1.  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 -PET scan on 05/03/2019 shows bulky intensely hypermetabolic peritoneal and retroperitoneal nodal mass, nodularity.  Additional hypermetabolic adenopathy involving the axilla, mediastinum, iliac and inguinal lymph nodes.  Multiple foci of discrete hypermetabolic metastases in the right iliac bone, left iliac crest, left and right scapula, and the C1 vertebral body. 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 -We had a prolonged conversation about aggressive nature of double hit lymphoma with high rates of recurrence. -Cycle 1 of dose adjusted R-EPOCH on 05/11/2019. -He did not receive complete dose of rituximab because of reactions on 2 different occasions.  Looks like he is having a reaction once we increase above a certain rate.  Reaction is abdominal pain. -Today he is seen with his daughter.  He reported vague abdominal discomfort and fullness.  As result of fullness, he is not able to eat much.  He lost about 25 pounds in the last 2 weeks. -I have reviewed his labs which showed some electrolyte abnormalities.  His creatinine has also gone up even though it remains in the normal range.  We will give him hydration today and tomorrow as he is not drinking enough fluids. -I will reevaluate him Monday for his cycle 2.  We plan to give him rituximab on Monday.  2.   Bilateral leg DVT: -Ultrasound Doppler on 04/18/2019 + for bilateral leg DVT. -She is continuing Eliquis without any problems.  3.  Right hydronephrosis: -Cystoscopy and bilateral JJ stent placement on 04/21/2019.  He continues to have mild hematuria.  4.  Weight loss: -He lost about 25 pounds since start of cycle 1 on 05/11/2019. -He is not eating much because of fullness in his abdomen.  He was told to drink 5 cans of boost plus every day.  He is currently drinking 2 cans of regular boost.  Is not eating much of solid foods.  5.  Hypophosphatemia: -His phosphate level today is 1.4. -We will start him on Neutra-Phos 3 times a day.  I have sent a prescription.      Orders placed this encounter:  No orders of the defined types were placed in this encounter.     Derek Jack, MD [place] [phone]

## 2019-05-26 ENCOUNTER — Ambulatory Visit: Payer: BLUE CROSS/BLUE SHIELD | Admitting: Family Medicine

## 2019-05-26 ENCOUNTER — Encounter (HOSPITAL_COMMUNITY): Payer: Self-pay

## 2019-05-26 ENCOUNTER — Ambulatory Visit (INDEPENDENT_AMBULATORY_CARE_PROVIDER_SITE_OTHER): Payer: BLUE CROSS/BLUE SHIELD | Admitting: Urology

## 2019-05-26 ENCOUNTER — Inpatient Hospital Stay (HOSPITAL_COMMUNITY): Payer: BLUE CROSS/BLUE SHIELD

## 2019-05-26 VITALS — BP 118/50 | HR 60 | Temp 98.1°F | Resp 18

## 2019-05-26 DIAGNOSIS — C8338 Diffuse large B-cell lymphoma, lymph nodes of multiple sites: Secondary | ICD-10-CM | POA: Diagnosis not present

## 2019-05-26 DIAGNOSIS — Z5112 Encounter for antineoplastic immunotherapy: Secondary | ICD-10-CM | POA: Diagnosis not present

## 2019-05-26 DIAGNOSIS — N132 Hydronephrosis with renal and ureteral calculous obstruction: Secondary | ICD-10-CM | POA: Diagnosis not present

## 2019-05-26 DIAGNOSIS — Z5111 Encounter for antineoplastic chemotherapy: Secondary | ICD-10-CM | POA: Diagnosis not present

## 2019-05-26 DIAGNOSIS — Z5189 Encounter for other specified aftercare: Secondary | ICD-10-CM | POA: Diagnosis not present

## 2019-05-26 DIAGNOSIS — N179 Acute kidney failure, unspecified: Secondary | ICD-10-CM

## 2019-05-26 MED ORDER — SODIUM CHLORIDE 0.9% FLUSH
10.0000 mL | Freq: Once | INTRAVENOUS | Status: AC | PRN
  Administered 2019-05-26: 10 mL

## 2019-05-26 MED ORDER — HEPARIN SOD (PORK) LOCK FLUSH 100 UNIT/ML IV SOLN
500.0000 [IU] | Freq: Once | INTRAVENOUS | Status: AC | PRN
  Administered 2019-05-26: 12:00:00 500 [IU]

## 2019-05-26 MED ORDER — SODIUM CHLORIDE 0.9 % IV SOLN
Freq: Once | INTRAVENOUS | Status: AC
  Administered 2019-05-26: 10:00:00 via INTRAVENOUS
  Filled 2019-05-26: qty 1000

## 2019-05-26 NOTE — Progress Notes (Signed)
[name] tolerated IV hydration with magnesium and potassium well without complaints or incident. VSS upon discharge. Pt discharged self ambulatory in satisfactory condition

## 2019-05-26 NOTE — Patient Instructions (Signed)
[facility] at [facility] Discharge Instructions  Received IV hydration with magnesium and potassium. Follow-up as scheduled. Call clinic for any questions or concerns   Thank you for choosing [facility] at [facility] to provide your oncology and hematology care.  To afford each patient quality time with our provider, please arrive at least 15 minutes before your scheduled appointment time.   If you have a lab appointment with the Cancer Center please come in thru the Main Entrance and check in at the main information desk.  You need to re-schedule your appointment should you arrive 10 or more minutes late.  We strive to give you quality time with our providers, and arriving late affects you and other patients whose appointments are after yours.  Also, if you no show three or more times for appointments you may be dismissed from the clinic at the providers discretion.     Again, thank you for choosing [facility].  Our hope is that these requests will decrease the amount of time that you wait before being seen by our physicians.       _____________________________________________________________  Should you have questions after your visit to [facility], please contact our office at [phone] between the hours of 8:00 a.m. and 4:30 p.m.  Voicemails left after 4:00 p.m. will not be returned until the following business day.  For prescription refill requests, have your pharmacy contact our office and allow 72 hours.    Due to Covid, you will need to wear a mask upon entering the hospital. If you do not have a mask, a mask will be given to you at the Main Entrance upon arrival. For doctor visits, patients may have 1 support person with them. For treatment visits, patients can not have anyone with them due to social distancing guidelines and our immunocompromised population.     

## 2019-05-31 ENCOUNTER — Other Ambulatory Visit: Payer: Self-pay

## 2019-05-31 ENCOUNTER — Inpatient Hospital Stay (HOSPITAL_COMMUNITY): Payer: BLUE CROSS/BLUE SHIELD

## 2019-05-31 ENCOUNTER — Inpatient Hospital Stay (HOSPITAL_COMMUNITY): Payer: BLUE CROSS/BLUE SHIELD | Admitting: Hematology

## 2019-05-31 ENCOUNTER — Other Ambulatory Visit: Payer: Self-pay | Admitting: Urology

## 2019-05-31 ENCOUNTER — Encounter (HOSPITAL_COMMUNITY): Payer: Self-pay | Admitting: Hematology

## 2019-05-31 VITALS — BP 108/50 | HR 71 | Temp 96.9°F | Resp 18

## 2019-05-31 VITALS — BP 133/51 | HR 81 | Temp 97.8°F | Resp 18 | Wt 180.0 lb

## 2019-05-31 DIAGNOSIS — C8338 Diffuse large B-cell lymphoma, lymph nodes of multiple sites: Secondary | ICD-10-CM | POA: Diagnosis not present

## 2019-05-31 DIAGNOSIS — Z5189 Encounter for other specified aftercare: Secondary | ICD-10-CM | POA: Diagnosis not present

## 2019-05-31 DIAGNOSIS — Z95828 Presence of other vascular implants and grafts: Secondary | ICD-10-CM

## 2019-05-31 DIAGNOSIS — N135 Crossing vessel and stricture of ureter without hydronephrosis: Secondary | ICD-10-CM

## 2019-05-31 DIAGNOSIS — Z5111 Encounter for antineoplastic chemotherapy: Secondary | ICD-10-CM | POA: Diagnosis not present

## 2019-05-31 DIAGNOSIS — Z5112 Encounter for antineoplastic immunotherapy: Secondary | ICD-10-CM | POA: Diagnosis not present

## 2019-05-31 LAB — COMPREHENSIVE METABOLIC PANEL
ALT: 23 U/L (ref 0–44)
AST: 18 U/L (ref 15–41)
Albumin: 2.6 g/dL — ABNORMAL LOW (ref 3.5–5.0)
Alkaline Phosphatase: 93 U/L (ref 38–126)
Anion gap: 10 (ref 5–15)
BUN: 17 mg/dL (ref 8–23)
CO2: 29 mmol/L (ref 22–32)
Calcium: 8.8 mg/dL — ABNORMAL LOW (ref 8.9–10.3)
Chloride: 101 mmol/L (ref 98–111)
Creatinine, Ser: 0.74 mg/dL (ref 0.61–1.24)
GFR calc Af Amer: >60 mL/min (ref >60)
GFR calc non Af Amer: >60 mL/min (ref >60)
Glucose, Bld: 122 mg/dL — ABNORMAL HIGH (ref 70–99)
Potassium: 3.9 mmol/L (ref 3.5–5.1)
Sodium: 140 mmol/L (ref 135–145)
Total Bilirubin: 0.3 mg/dL (ref 0.3–1.2)
Total Protein: 5.7 g/dL — ABNORMAL LOW (ref 6.5–8.1)

## 2019-05-31 LAB — MAGNESIUM: Magnesium: 1.7 mg/dL (ref 1.7–2.4)

## 2019-05-31 LAB — CBC WITH DIFFERENTIAL/PLATELET
Abs Immature Granulocytes: 5.04 10*3/uL — ABNORMAL HIGH (ref 0.00–0.07)
Basophils Absolute: 0.3 10*3/uL — ABNORMAL HIGH (ref 0.0–0.1)
Basophils Relative: 1 %
Eosinophils Absolute: 0 10*3/uL (ref 0.0–0.5)
Eosinophils Relative: 0 %
HCT: 28.8 % — ABNORMAL LOW (ref 39.0–52.0)
Hemoglobin: 9.2 g/dL — ABNORMAL LOW (ref 13.0–17.0)
Immature Granulocytes: 14 %
Lymphocytes Relative: 5 %
Lymphs Abs: 1.7 10*3/uL (ref 0.7–4.0)
MCH: 29.4 pg (ref 26.0–34.0)
MCHC: 31.9 g/dL (ref 30.0–36.0)
MCV: 92 fL (ref 80.0–100.0)
Monocytes Absolute: 2.1 10*3/uL — ABNORMAL HIGH (ref 0.1–1.0)
Monocytes Relative: 6 %
Neutro Abs: 26.1 10*3/uL — ABNORMAL HIGH (ref 1.7–7.7)
Neutrophils Relative %: 74 %
Platelets: 672 10*3/uL — ABNORMAL HIGH (ref 150–400)
RBC: 3.13 MIL/uL — ABNORMAL LOW (ref 4.22–5.81)
RDW: 12.7 % (ref 11.5–15.5)
WBC: 35.2 10*3/uL — ABNORMAL HIGH (ref 4.0–10.5)
nRBC: 0 % (ref 0.0–0.2)

## 2019-05-31 LAB — PHOSPHORUS: Phosphorus: 4.3 mg/dL (ref 2.5–4.6)

## 2019-05-31 LAB — URIC ACID: Uric Acid, Serum: 3 mg/dL — ABNORMAL LOW (ref 3.7–8.6)

## 2019-05-31 MED ORDER — SODIUM CHLORIDE 0.9 % IV SOLN
Freq: Once | INTRAVENOUS | Status: DC
  Filled 2019-05-31: qty 4

## 2019-05-31 MED ORDER — VINCRISTINE SULFATE CHEMO INJECTION 1 MG/ML
Freq: Once | INTRAVENOUS | Status: AC
  Administered 2019-05-31: 16:00:00 via INTRAVENOUS
  Filled 2019-05-31: qty 10

## 2019-05-31 MED ORDER — SODIUM CHLORIDE 0.9% FLUSH
10.0000 mL | INTRAVENOUS | Status: DC | PRN
  Administered 2019-05-31: 10 mL
  Filled 2019-05-31: qty 10

## 2019-05-31 MED ORDER — SODIUM CHLORIDE 0.9 % IV SOLN
INTRAVENOUS | Status: DC
  Administered 2019-05-31: 09:00:00 via INTRAVENOUS

## 2019-05-31 MED ORDER — ONDANSETRON HCL 40 MG/20ML IJ SOLN
Freq: Once | INTRAMUSCULAR | Status: AC
Start: 2019-05-31 — End: 2019-05-31
  Administered 2019-05-31: 8 mg via INTRAVENOUS
  Filled 2019-05-31: qty 4

## 2019-05-31 MED ORDER — SODIUM CHLORIDE 0.9 % IV SOLN
375.0000 mg/m2 | Freq: Once | INTRAVENOUS | Status: AC
  Administered 2019-05-31: 800 mg via INTRAVENOUS
  Filled 2019-05-31: qty 50

## 2019-05-31 MED ORDER — PREDNISONE 50 MG PO TABS
62.0000 mg | ORAL_TABLET | Freq: Two times a day (BID) | ORAL | Status: DC
  Filled 2019-05-31 (×3): qty 2

## 2019-05-31 MED ORDER — DIPHENHYDRAMINE HCL 50 MG/ML IJ SOLN
50.0000 mg | Freq: Once | INTRAMUSCULAR | Status: AC
  Administered 2019-05-31: 50 mg via INTRAVENOUS
  Filled 2019-05-31: qty 1

## 2019-05-31 MED ORDER — FAMOTIDINE IN NACL 20-0.9 MG/50ML-% IV SOLN
20.0000 mg | Freq: Once | INTRAVENOUS | Status: AC
  Administered 2019-05-31: 20 mg via INTRAVENOUS
  Filled 2019-05-31: qty 50

## 2019-05-31 MED ORDER — ACETAMINOPHEN 325 MG PO TABS
650.0000 mg | ORAL_TABLET | Freq: Once | ORAL | Status: AC
  Administered 2019-05-31: 650 mg via ORAL
  Filled 2019-05-31: qty 2

## 2019-05-31 NOTE — Patient Instructions (Addendum)
[place] at [facility] Discharge Instructions  You were seen today by Dr. Delton Coombes. He went over your recent lab results. He will see you back in 1 week for labs, fluids and follow up.   Thank you for choosing [place] at [facility] to provide your oncology and hematology care.  To afford each patient quality time with our provider, please arrive at least 15 minutes before your scheduled appointment time.   If you have a lab appointment with the [facility] please come in thru the  Main Entrance and check in at the main information desk  You need to re-schedule your appointment should you arrive 10 or more minutes late.  We strive to give you quality time with our providers, and arriving late affects you and other patients whose appointments are after yours.  Also, if you no show three or more times for appointments you may be dismissed from the clinic at the providers discretion.     Again, thank you for choosing [facility].  Our hope is that these requests will decrease the amount of time that you wait before being seen by our physicians.       _____________________________________________________________  Should you have questions after your visit to [facility], please contact our office at [phone] between the hours of 8:00 a.m. and 4:30 p.m.  Voicemails left after 4:00 p.m. will not be returned until the following business day.  For prescription refill requests, have your pharmacy contact our office and allow 72 hours.    Cancer Center Support Programs:   > Cancer Support Group  2nd Tuesday of the month 1pm-2pm, Journey Room

## 2019-05-31 NOTE — Progress Notes (Signed)
Patient presents today for treatment and follow up visit. MAR reviewed. Vital signs within parameters for treatment. Labs pending.   Message received from [facility] LPN per Dr. Delton Coombes start treatment now. Labs pending.   Verbal order from Dr. Delton Coombes hold prednisone today and give 20mg  of Decadron IV.   13:31. Patient complains of a slight growling sensation noted in his abdomen. Patient denies pain but states he feels like his abdomen is growling. Vital signs normal. Pt states his abdomen feels like it's growling per patient's words.   Reported via instant message the complaints of the patient to Dr. Delton Coombes and Aviva Signs LPN. No new orders received.   Per verbal order from Dr. Delton Coombes Stop Rituximab at 15:30. Patient to return tomorrow for the remainder of the Rituximab via a peripheral site and administer pump chemotherapy and pre-meds  through port.   Treatment given today per MD orders. Tolerated infusion without adverse affects. Vital signs stable. No complaints at this time. Discharged from clinic ambulatory. F/U with [facility] as scheduled.

## 2019-05-31 NOTE — Progress Notes (Signed)
[place], [place] [address]   CLINIC:  Medical Oncology/Hematology  PCP:  Kevin [name], [address] [id number] [phone]   REASON FOR VISIT:  Follow-up for high-grade lymphoma.  CURRENT THERAPY: Dose adjusted R-EPOCH  BRIEF ONCOLOGIC HISTORY:  Oncology History  Diffuse large B-cell lymphoma of lymph nodes of multiple regions ([place])  04/26/2019 Initial Diagnosis   Diffuse large B-cell lymphoma of lymph nodes of multiple regions ([place])   05/10/2019 -  Chemotherapy   The patient had pegfilgrastim (NEULASTA ONPRO KIT) injection 6 mg, 6 mg, Subcutaneous, Once, 1 of 1 cycle pegfilgrastim-jmdb (FULPHILA) injection 6 mg, 6 mg, Subcutaneous,  Once, 2 of 4 cycles Administration: 6 mg (05/18/2019) DOXOrubicin (ADRIAMYCIN) 20 mg, etoposide (VEPESID) 104 mg, vinCRIStine (ONCOVIN) 0.8 mg in sodium chloride 0.9 % 500 mL chemo infusion, , Intravenous, Once, 2 of 4 cycles Administration:  (05/11/2019),  (05/31/2019),  (05/12/2019),  (05/13/2019),  (05/14/2019) ondansetron (ZOFRAN) 8 mg in sodium chloride 0.9 % 50 mL IVPB, , Intravenous,  Once, 2 of 4 cycles Administration: 8 mg (05/10/2019), 8 mg (05/11/2019), 16 mg (05/17/2019), 8 mg (05/12/2019), 8 mg (05/13/2019), 8 mg (05/14/2019) cyclophosphamide (CYTOXAN) 1,560 mg in sodium chloride 0.9 % 250 mL chemo infusion, 750 mg/m2 = 1,560 mg, Intravenous,  Once, 2 of 4 cycles Administration: 1,560 mg (05/17/2019) riTUXimab-pvvr (RUXIENCE) 800 mg in sodium chloride 0.9 % 250 mL (2.4242 mg/mL) infusion, 375 mg/m2 = 800 mg, Intravenous,  Once, 2 of 4 cycles Dose modification: 100 mg (original dose 375 mg/m2, Cycle 1, Reason: Other (see comments), Comment: test dose), 600 mg (original dose 375 mg/m2, Cycle 1, Reason: Other (see comments), Comment: due to previous reaction giving test dose first) Administration: 800 mg (05/10/2019), 800 mg (05/31/2019), 100 mg (05/17/2019)  for chemotherapy  treatment.    05/11/2019 - 05/11/2019 Chemotherapy   The patient had DOXOrubicin (ADRIAMYCIN) chemo injection 104 mg, 50 mg/m2, Intravenous,  Once, 0 of 6 cycles palonosetron (ALOXI) injection 0.25 mg, 0.25 mg, Intravenous,  Once, 0 of 6 cycles pegfilgrastim-jmdb (FULPHILA) injection 6 mg, 6 mg, Subcutaneous,  Once, 0 of 6 cycles vinCRIStine (ONCOVIN) 2 mg in sodium chloride 0.9 % 50 mL chemo infusion, 2 mg, Intravenous,  Once, 0 of 6 cycles cyclophosphamide (CYTOXAN) 1,540 mg in sodium chloride 0.9 % 250 mL chemo infusion, 750 mg/m2, Intravenous,  Once, 0 of 6 cycles fosaprepitant (EMEND) 150 mg, dexamethasone (DECADRON) 12 mg in sodium chloride 0.9 % 145 mL IVPB, , Intravenous,  Once, 0 of 6 cycles riTUXimab-pvvr (RUXIENCE) 800 mg in sodium chloride 0.9 % 250 mL (2.4242 mg/mL) infusion, 375 mg/m2, Intravenous,  Once, 0 of 6 cycles  for chemotherapy treatment.       CANCER STAGING: Cancer Staging No matching staging information was found for the patient.   INTERVAL HISTORY:  Kevin [name] 63 y.o. male seen for follow-up of double hit lymphoma.  He felt better after IV fluids on 2 days last week.  His appetite has picked up.  He is eating better.  He also gained 1 pound.  He is drinking about 2 cans of Ensure.  Appetite and energy levels are 75%.  Denies any tingling or numbness in the extremities.  Denies any fevers or chills.  Mild hematuria is still present.  REVIEW OF SYSTEMS:  Review of Systems  Genitourinary: Positive for hematuria.   All other systems reviewed and are negative.    PAST MEDICAL/SURGICAL HISTORY:  Past Medical History:  Diagnosis Date  . Diffuse large B cell lymphoma ([place]) 04/26/2019  . Hyperlipidemia 02/01/2014  . Hypertension   . Leg DVT (deep venous thromboembolism), acute, bilateral ([place]) 04/18/2019  . Port-A-Cath in place 04/27/2019   Past Surgical History:  Procedure Laterality Date  . CYSTOSCOPY W/ URETERAL STENT PLACEMENT Bilateral 04/21/2019    Procedure: CYSTOSCOPY WITH RETROGRADE PYELOGRAM/URETERAL STENT PLACEMENT;  Surgeon: Cleon Gustin, MD;  Location: [facility];  Service: Urology;  Laterality: Bilateral;  . INGUINAL LYMPH NODE BIOPSY Right 04/21/2019   Procedure: INGUINAL LYMPH NODE BIOPSY;  Surgeon: Virl Cagey, MD;  Location: [facility];  Service: General;  Laterality: Right;  . PORTACATH PLACEMENT Left 05/07/2019   Procedure: INSERTION PORT-A-CATH (catheter left subclavian);  Surgeon: Virl Cagey, MD;  Location: [facility];  Service: General;  Laterality: Left;     SOCIAL HISTORY:  Social History   Socioeconomic History  . Marital status: Married    Spouse name: Not on file  . Number of children: Not on file  . Years of education: Not on file  . Highest education level: Not on file  Occupational History  . Not on file  Social Needs  . Financial resource strain: Not on file  . Food insecurity    Worry: Not on file    Inability: Not on file  . Transportation needs    Medical: Not on file    Non-medical: Not on file  Tobacco Use  . Smoking status: Former Smoker    Quit date: 04/27/1999    Years since quitting: 20.1  . Smokeless tobacco: Never Used  Substance and Sexual Activity  . Alcohol use: Yes    Alcohol/week: 56.0 standard drinks    Types: 56 Cans of beer per week  . Drug use: Never  . Sexual activity: Not on file  Lifestyle  . Physical activity    Days per week: Not on file    Minutes per session: Not on file  . Stress: Not on file  Relationships  . Social Herbalist on phone: Not on file    Gets together: Not on file    Attends religious service: Not on file    Active member of club or organization: Not on file    Attends meetings of clubs or organizations: Not on file    Relationship status: Not on file  . Intimate partner violence    Fear of current or ex partner: Not on file    Emotionally abused: Not on file    Physically abused: Not on file    Forced sexual activity:  Not on file  Other Topics Concern  . Not on file  Social History Narrative  . Not on file    FAMILY HISTORY:  Family History  Problem Relation Age of Onset  . Diabetes Brother   . Diabetes Mother   . Leukemia Brother   . Diabetes Son     CURRENT MEDICATIONS:  Outpatient Encounter Medications as of 05/31/2019  Medication Sig  . allopurinol (ZYLOPRIM) 300 MG tablet Take 1 tablet (300 mg total) by mouth daily.  Marland Kitchen amLODipine (NORVASC) 10 MG tablet Take 1 tablet (10 mg total) by mouth daily.  Marland Kitchen apixaban (ELIQUIS) 5 MG TABS tablet Take 1 tablet (5 mg total) by mouth 2 (two) times daily.  . CYCLOPHOSPHAMIDE IV Inject into the vein every 21 ( twenty-one) days.  Marland Kitchen docusate sodium (COLACE) 100 MG capsule Take 100 mg by mouth 2 (two) times daily.  Marland Kitchen  DOXORUBICIN HCL IV Inject into the vein every 21 ( twenty-one) days.  . ETOPOSIDE IV Inject into the vein.  . K Phos Mono-Sod Phos Di & Mono (K-PHOS-NEUTRAL) 5630012152 MG TABS Take 1 tablet by mouth 3 (three) times daily.  Marland Kitchen losartan (COZAAR) 100 MG tablet Take 1 tablet (100 mg total) by mouth daily.  . metoprolol succinate (TOPROL-XL) 50 MG 24 hr tablet Take 1 tablet (50 mg total) by mouth daily.  . prochlorperazine (COMPAZINE) 10 MG tablet Take 10 mg by mouth every 6 (six) hours as needed for nausea or vomiting.  . vinCRIStine 2 mg in sodium chloride 0.9 % 50 mL Inject 2 mg into the vein every 21 ( twenty-one) days.  Marland Kitchen HYDROcodone-acetaminophen (NORCO/VICODIN) 5-325 MG tablet Take 1 tablet by mouth every 8 (eight) hours as needed for moderate pain.   . OXYGEN Inhale 2 L into the lungs 3 (three) times daily as needed (oxygen level below 93).   Marland Kitchen RITUXIMAB IV Inject into the vein every 21 ( twenty-one) days.  . [DISCONTINUED] traMADol (ULTRAM) 50 MG tablet Take 1 tablet (50 mg total) by mouth every 6 (six) hours as needed for moderate pain or severe pain. (Patient not taking: Reported on 05/25/2019)   No facility-administered encounter  medications on file as of 05/31/2019.     ALLERGIES:  No Known Allergies   PHYSICAL EXAM:  ECOG Performance status: 1  Vitals:   05/31/19 0822  BP: (!) 133/51  Pulse: 81  Resp: 18  Temp: 97.8 F (36.6 C)  SpO2: 95%   Filed Weights   05/31/19 0825  Weight: 180 lb (81.6 kg)    Physical Exam Vitals signs reviewed.  Constitutional:      Appearance: Normal appearance.  Cardiovascular:     Rate and Rhythm: Normal rate and regular rhythm.     Heart sounds: Normal heart sounds.  Pulmonary:     Effort: Pulmonary effort is normal.     Breath sounds: Normal breath sounds.  Abdominal:     General: There is no distension.     Palpations: Abdomen is soft. There is no mass.  Musculoskeletal:        General: No swelling.  Skin:    General: Skin is warm.  Neurological:     General: No focal deficit present.     Mental Status: He is alert and oriented to person, place, and time.  Psychiatric:        Mood and Affect: Mood normal.        Behavior: Behavior normal.    Bilateral inguinal adenopathy.  LABORATORY DATA:  I have reviewed the labs as listed.  CBC    Component Value Date/Time   WBC 35.2 (H) 05/31/2019 0819   RBC 3.13 (L) 05/31/2019 0819   HGB 9.2 (L) 05/31/2019 0819   HCT 28.8 (L) 05/31/2019 0819   PLT 672 (H) 05/31/2019 0819   MCV 92.0 05/31/2019 0819   MCH 29.4 05/31/2019 0819   MCHC 31.9 05/31/2019 0819   RDW 12.7 05/31/2019 0819   LYMPHSABS 1.7 05/31/2019 0819   MONOABS 2.1 (H) 05/31/2019 0819   EOSABS 0.0 05/31/2019 0819   BASOSABS 0.3 (H) 05/31/2019 0819   CMP Latest Ref Rng & Units 05/31/2019 05/25/2019 05/17/2019  Glucose 70 - 99 mg/dL 122(H) 109(H) 120(H)  BUN 8 - 23 mg/dL '17 16 17  '$ Creatinine 0.61 - 1.24 mg/dL 0.74 0.85 0.55(L)  Sodium 135 - 145 mmol/L 140 135 140  Potassium 3.5 - 5.1 mmol/L 3.9  3.2(L) 3.8  Chloride 98 - 111 mmol/L 101 101 100  CO2 22 - 32 mmol/L '29 27 30  '$ Calcium 8.9 - 10.3 mg/dL 8.8(L) 7.6(L) 8.1(L)  Total Protein 6.5 -  8.1 g/dL 5.7(L) 5.1(L) -  Total Bilirubin 0.3 - 1.2 mg/dL 0.3 0.3 -  Alkaline Phos 38 - 126 U/L 93 75 -  AST 15 - 41 U/L 18 16 -  ALT 0 - 44 U/L 23 20 -       DIAGNOSTIC IMAGING:  I have independently reviewed the scans and discussed with the patient.     ASSESSMENT & PLAN:   Diffuse large B-cell lymphoma of lymph nodes of multiple regions ([place]) 1.  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 -PET scan on 05/03/2019 shows bulky intensely hypermetabolic peritoneal and retroperitoneal nodal mass, nodularity.  Additional hypermetabolic adenopathy involving the axilla, mediastinum, iliac and inguinal lymph nodes.  Multiple foci of discrete hypermetabolic metastases in the right iliac bone, left iliac crest, left and right scapula, and the C1 vertebral body. 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 -Cycle 1 of dose adjusted R-EPOCH on 05/11/2019. -He could not complete rituximab dose because of reactions with abdominal pain on 2 different occasions with cycle 1. -He has been feeling well for the past few days.  Appetite has picked up.  He gained 1 pound. -His abdominal pain has improved.  We reviewed his labs.  He will proceed with cycle 2 without any dose modifications. -We will try to give him rituximab at a lower infusion rate.  We will split his dose between today and tomorrow. -We will closely monitor his labs on a weekly basis.  2.  Bilateral leg DVT: -Ultrasound Doppler on 04/18/2019 + for bilateral leg DVT. -He will continue Eliquis.  3.  Right hydronephrosis: -Cystoscopy and bilateral JJ stent placement on 04/21/2019.  He continues to have mild hematuria.  4.  Weight loss: -He lost about 25 pounds since start of cycle 1 on 05/11/2019. -He is eating better since last week.  He gained 1 pound.  5.  Hypophosphatemia: -He was started  on Neutra-Phos 3 times a day for a phosphate level of 1.4 last week. -Today phosphate improved to normal.       Orders placed this encounter:  Orders Placed This Encounter  Procedures  . CBC with Differential/Platelet  . Comprehensive metabolic panel  . Magnesium  . Phosphorus  . Uric acid      Derek Jack, MD [place] [phone]

## 2019-05-31 NOTE — Assessment & Plan Note (Signed)
1.  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 -PET scan on 05/03/2019 shows bulky intensely hypermetabolic peritoneal and retroperitoneal nodal mass, nodularity.  Additional hypermetabolic adenopathy involving the axilla, mediastinum, iliac and inguinal lymph nodes.  Multiple foci of discrete hypermetabolic metastases in the right iliac bone, left iliac crest, left and right scapula, and the C1 vertebral body. 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 -Cycle 1 of dose adjusted R-EPOCH on 05/11/2019. -He could not complete rituximab dose because of reactions with abdominal pain on 2 different occasions with cycle 1. -He has been feeling well for the past few days.  Appetite has picked up.  He gained 1 pound. -His abdominal pain has improved.  We reviewed his labs.  He will proceed with cycle 2 without any dose modifications. -We will try to give him rituximab at a lower infusion rate.  We will split his dose between today and tomorrow. -We will closely monitor his labs on a weekly basis.  2.  Bilateral leg DVT: -Ultrasound Doppler on 04/18/2019 + for bilateral leg DVT. -He will continue Eliquis.  3.  Right hydronephrosis: -Cystoscopy and bilateral JJ stent placement on 04/21/2019.  He continues to have mild hematuria.  4.  Weight loss: -He lost about 25 pounds since start of cycle 1 on 05/11/2019. -He is eating better since last week.  He gained 1 pound.  5.  Hypophosphatemia: -He was started on Neutra-Phos 3 times a day for a phosphate level of 1.4 last week. -Today phosphate improved to normal.

## 2019-05-31 NOTE — Patient Instructions (Signed)
[facility] Discharge Instructions for Patients Receiving Chemotherapy  Today you received the following chemotherapy agents   To help prevent nausea and vomiting after your treatment, we encourage you to take your nausea medication   If you develop nausea and vomiting that is not controlled by your nausea medication, call the clinic.   BELOW ARE SYMPTOMS THAT SHOULD BE REPORTED IMMEDIATELY:  *FEVER GREATER THAN 100.5 F  *CHILLS WITH OR WITHOUT FEVER  NAUSEA AND VOMITING THAT IS NOT CONTROLLED WITH YOUR NAUSEA MEDICATION  *UNUSUAL SHORTNESS OF BREATH  *UNUSUAL BRUISING OR BLEEDING  TENDERNESS IN MOUTH AND THROAT WITH OR WITHOUT PRESENCE OF ULCERS  *URINARY PROBLEMS  *BOWEL PROBLEMS  UNUSUAL RASH Items with * indicate a potential emergency and should be followed up as soon as possible.  Feel free to call the clinic should you have any questions or concerns. The clinic phone number is [phone].  Please show the CHEMO ALERT CARD at check-in to the Emergency Department and triage nurse.   

## 2019-06-01 ENCOUNTER — Inpatient Hospital Stay (HOSPITAL_COMMUNITY): Payer: BLUE CROSS/BLUE SHIELD | Attending: Hematology

## 2019-06-01 VITALS — BP 129/52 | HR 65 | Temp 97.1°F | Resp 18 | Wt 185.0 lb

## 2019-06-01 DIAGNOSIS — C8338 Diffuse large B-cell lymphoma, lymph nodes of multiple sites: Secondary | ICD-10-CM | POA: Insufficient documentation

## 2019-06-01 DIAGNOSIS — Z5111 Encounter for antineoplastic chemotherapy: Secondary | ICD-10-CM | POA: Insufficient documentation

## 2019-06-01 DIAGNOSIS — Z5189 Encounter for other specified aftercare: Secondary | ICD-10-CM | POA: Insufficient documentation

## 2019-06-01 DIAGNOSIS — Z95828 Presence of other vascular implants and grafts: Secondary | ICD-10-CM

## 2019-06-01 DIAGNOSIS — Z5112 Encounter for antineoplastic immunotherapy: Secondary | ICD-10-CM | POA: Diagnosis not present

## 2019-06-01 LAB — COMPREHENSIVE METABOLIC PANEL
ALT: 21 U/L (ref 0–44)
AST: 18 U/L (ref 15–41)
Albumin: 2.5 g/dL — ABNORMAL LOW (ref 3.5–5.0)
Alkaline Phosphatase: 94 U/L (ref 38–126)
Anion gap: 9 (ref 5–15)
BUN: 20 mg/dL (ref 8–23)
CO2: 28 mmol/L (ref 22–32)
Calcium: 8.3 mg/dL — ABNORMAL LOW (ref 8.9–10.3)
Chloride: 104 mmol/L (ref 98–111)
Creatinine, Ser: 0.72 mg/dL (ref 0.61–1.24)
GFR calc Af Amer: >60 mL/min (ref >60)
GFR calc non Af Amer: >60 mL/min (ref >60)
Glucose, Bld: 139 mg/dL — ABNORMAL HIGH (ref 70–99)
Potassium: 3.9 mmol/L (ref 3.5–5.1)
Sodium: 141 mmol/L (ref 135–145)
Total Bilirubin: 0.3 mg/dL (ref 0.3–1.2)
Total Protein: 5.7 g/dL — ABNORMAL LOW (ref 6.5–8.1)

## 2019-06-01 LAB — CBC WITH DIFFERENTIAL/PLATELET
Abs Immature Granulocytes: 4.24 10*3/uL — ABNORMAL HIGH (ref 0.00–0.07)
Basophils Absolute: 0.1 10*3/uL (ref 0.0–0.1)
Basophils Relative: 0 %
Eosinophils Absolute: 0 10*3/uL (ref 0.0–0.5)
Eosinophils Relative: 0 %
HCT: 26.4 % — ABNORMAL LOW (ref 39.0–52.0)
Hemoglobin: 8.5 g/dL — ABNORMAL LOW (ref 13.0–17.0)
Immature Granulocytes: 9 %
Lymphocytes Relative: 2 %
Lymphs Abs: 1 10*3/uL (ref 0.7–4.0)
MCH: 29.9 pg (ref 26.0–34.0)
MCHC: 32.2 g/dL (ref 30.0–36.0)
MCV: 93 fL (ref 80.0–100.0)
Monocytes Absolute: 1.3 10*3/uL — ABNORMAL HIGH (ref 0.1–1.0)
Monocytes Relative: 3 %
Neutro Abs: 42 10*3/uL — ABNORMAL HIGH (ref 1.7–7.7)
Neutrophils Relative %: 86 %
Platelets: 761 10*3/uL — ABNORMAL HIGH (ref 150–400)
RBC: 2.84 MIL/uL — ABNORMAL LOW (ref 4.22–5.81)
RDW: 12.6 % (ref 11.5–15.5)
WBC Morphology: INCREASED
WBC: 48.6 10*3/uL — ABNORMAL HIGH (ref 4.0–10.5)
nRBC: 0 % (ref 0.0–0.2)

## 2019-06-01 LAB — URIC ACID: Uric Acid, Serum: 2.8 mg/dL — ABNORMAL LOW (ref 3.7–8.6)

## 2019-06-01 LAB — MAGNESIUM: Magnesium: 1.7 mg/dL (ref 1.7–2.4)

## 2019-06-01 LAB — PHOSPHORUS: Phosphorus: 3.1 mg/dL (ref 2.5–4.6)

## 2019-06-01 MED ORDER — FAMOTIDINE IN NACL 20-0.9 MG/50ML-% IV SOLN
INTRAVENOUS | Status: AC
  Filled 2019-06-01: qty 50

## 2019-06-01 MED ORDER — ACETAMINOPHEN 325 MG PO TABS
650.0000 mg | ORAL_TABLET | Freq: Once | ORAL | Status: AC
  Administered 2019-06-01: 650 mg via ORAL

## 2019-06-01 MED ORDER — SODIUM CHLORIDE 0.9 % IV SOLN
Freq: Once | INTRAVENOUS | Status: AC
  Administered 2019-06-01: 11:00:00 via INTRAVENOUS

## 2019-06-01 MED ORDER — ACETAMINOPHEN 325 MG PO TABS
ORAL_TABLET | ORAL | Status: AC
  Filled 2019-06-01: qty 2

## 2019-06-01 MED ORDER — FAMOTIDINE IN NACL 20-0.9 MG/50ML-% IV SOLN
20.0000 mg | Freq: Once | INTRAVENOUS | Status: AC
  Administered 2019-06-01: 20 mg via INTRAVENOUS

## 2019-06-01 MED ORDER — SODIUM CHLORIDE 0.9 % IV SOLN
Freq: Once | INTRAVENOUS | Status: AC
  Administered 2019-06-01: 8 mg via INTRAVENOUS
  Filled 2019-06-01: qty 4

## 2019-06-01 MED ORDER — DIPHENHYDRAMINE HCL 50 MG/ML IJ SOLN
INTRAMUSCULAR | Status: AC
  Filled 2019-06-01: qty 1

## 2019-06-01 MED ORDER — VINCRISTINE SULFATE CHEMO INJECTION 1 MG/ML
Freq: Once | INTRAVENOUS | Status: AC
  Administered 2019-06-01: 14:00:00 via INTRAVENOUS
  Filled 2019-06-01: qty 10

## 2019-06-01 MED ORDER — SODIUM CHLORIDE 0.9 % IV SOLN
400.0000 mg | Freq: Once | INTRAVENOUS | Status: AC
  Administered 2019-06-01: 400 mg via INTRAVENOUS
  Filled 2019-06-01: qty 40

## 2019-06-01 MED ORDER — SODIUM CHLORIDE 0.9% FLUSH
10.0000 mL | Freq: Once | INTRAVENOUS | Status: AC
  Administered 2019-06-01: 10 mL

## 2019-06-01 MED ORDER — DIPHENHYDRAMINE HCL 50 MG/ML IJ SOLN
50.0000 mg | Freq: Once | INTRAMUSCULAR | Status: AC
  Administered 2019-06-01: 50 mg via INTRAVENOUS

## 2019-06-01 NOTE — Progress Notes (Signed)
   Patient presents today for day 2 Rituximab infusion.  Patient has no complaints of any changes since the last visit. MAR reviewed.  Patient received Zofran 8mg  and Decadron 10mg  IVPB today prior to Rituximab.   Treatment given today per MD orders. Tolerated infusion without adverse affects. Vital signs stable. No complaints at this time. 5FU pump infusing per protocol. RUN noted on the screen.  Discharged from clinic ambulatory. F/U with [facility] as scheduled.

## 2019-06-01 NOTE — Progress Notes (Signed)
06/01/19  Infuse remainder of Rituximab from 05/31/19 which is approximately 400 mg at 41.3 ml/hour until completed.  Stability and storage of prepared product confirmed for today.  Rituxumab will be infused through peripheral iv line while EPOCH bag continues to run.  Pre-medications for rituximab entered as previous day orders.  T.O. Dr Maye Hides RN/Osias Resnick Ronnald Ramp, PharmD

## 2019-06-01 NOTE — Patient Instructions (Signed)
[facility] Discharge Instructions for Patients Receiving Chemotherapy  Today you received the following chemotherapy agents   To help prevent nausea and vomiting after your treatment, we encourage you to take your nausea medication   If you develop nausea and vomiting that is not controlled by your nausea medication, call the clinic.   BELOW ARE SYMPTOMS THAT SHOULD BE REPORTED IMMEDIATELY:  *FEVER GREATER THAN 100.5 F  *CHILLS WITH OR WITHOUT FEVER  NAUSEA AND VOMITING THAT IS NOT CONTROLLED WITH YOUR NAUSEA MEDICATION  *UNUSUAL SHORTNESS OF BREATH  *UNUSUAL BRUISING OR BLEEDING  TENDERNESS IN MOUTH AND THROAT WITH OR WITHOUT PRESENCE OF ULCERS  *URINARY PROBLEMS  *BOWEL PROBLEMS  UNUSUAL RASH Items with * indicate a potential emergency and should be followed up as soon as possible.  Feel free to call the clinic should you have any questions or concerns. The clinic phone number is [phone].  Please show the CHEMO ALERT CARD at check-in to the Emergency Department and triage nurse.   

## 2019-06-02 ENCOUNTER — Inpatient Hospital Stay (HOSPITAL_COMMUNITY): Payer: BLUE CROSS/BLUE SHIELD

## 2019-06-02 ENCOUNTER — Other Ambulatory Visit: Payer: Self-pay

## 2019-06-02 VITALS — BP 118/51 | HR 60 | Temp 96.8°F | Resp 18

## 2019-06-02 DIAGNOSIS — C8338 Diffuse large B-cell lymphoma, lymph nodes of multiple sites: Secondary | ICD-10-CM

## 2019-06-02 DIAGNOSIS — Z5112 Encounter for antineoplastic immunotherapy: Secondary | ICD-10-CM | POA: Diagnosis not present

## 2019-06-02 DIAGNOSIS — Z5111 Encounter for antineoplastic chemotherapy: Secondary | ICD-10-CM | POA: Diagnosis not present

## 2019-06-02 DIAGNOSIS — Z95828 Presence of other vascular implants and grafts: Secondary | ICD-10-CM

## 2019-06-02 DIAGNOSIS — Z5189 Encounter for other specified aftercare: Secondary | ICD-10-CM | POA: Diagnosis not present

## 2019-06-02 MED ORDER — SODIUM CHLORIDE 0.9 % IV SOLN
Freq: Once | INTRAVENOUS | Status: AC
  Administered 2019-06-02: 8 mg via INTRAVENOUS
  Filled 2019-06-02: qty 4

## 2019-06-02 MED ORDER — VINCRISTINE SULFATE CHEMO INJECTION 1 MG/ML
Freq: Once | INTRAVENOUS | Status: AC
  Administered 2019-06-02: 14:00:00 via INTRAVENOUS
  Filled 2019-06-02: qty 10

## 2019-06-02 MED ORDER — SODIUM CHLORIDE 0.9 % IV SOLN
INTRAVENOUS | Status: DC
  Administered 2019-06-02: 13:00:00 via INTRAVENOUS

## 2019-06-02 NOTE — Progress Notes (Signed)
Patient in today for ambulatory pump change. Medications given per orders. No labs today per MD.   Treatment given per orders. Patient tolerated it well without problems. Vitals stable and discharged home from clinic ambulatory. Follow up as scheduled.

## 2019-06-03 ENCOUNTER — Inpatient Hospital Stay (HOSPITAL_COMMUNITY): Payer: BLUE CROSS/BLUE SHIELD

## 2019-06-03 ENCOUNTER — Encounter (HOSPITAL_COMMUNITY): Payer: Self-pay

## 2019-06-03 VITALS — BP 146/54 | HR 60 | Temp 98.0°F | Resp 18

## 2019-06-03 DIAGNOSIS — C8338 Diffuse large B-cell lymphoma, lymph nodes of multiple sites: Secondary | ICD-10-CM | POA: Diagnosis not present

## 2019-06-03 DIAGNOSIS — Z5189 Encounter for other specified aftercare: Secondary | ICD-10-CM | POA: Diagnosis not present

## 2019-06-03 DIAGNOSIS — Z95828 Presence of other vascular implants and grafts: Secondary | ICD-10-CM

## 2019-06-03 DIAGNOSIS — Z5111 Encounter for antineoplastic chemotherapy: Secondary | ICD-10-CM | POA: Diagnosis not present

## 2019-06-03 DIAGNOSIS — Z5112 Encounter for antineoplastic immunotherapy: Secondary | ICD-10-CM | POA: Diagnosis not present

## 2019-06-03 LAB — COMPREHENSIVE METABOLIC PANEL
ALT: 21 U/L (ref 0–44)
AST: 15 U/L (ref 15–41)
Albumin: 2.4 g/dL — ABNORMAL LOW (ref 3.5–5.0)
Alkaline Phosphatase: 73 U/L (ref 38–126)
Anion gap: 8 (ref 5–15)
BUN: 24 mg/dL — ABNORMAL HIGH (ref 8–23)
CO2: 26 mmol/L (ref 22–32)
Calcium: 7.9 mg/dL — ABNORMAL LOW (ref 8.9–10.3)
Chloride: 103 mmol/L (ref 98–111)
Creatinine, Ser: 0.61 mg/dL (ref 0.61–1.24)
GFR calc Af Amer: >60 mL/min (ref >60)
GFR calc non Af Amer: >60 mL/min (ref >60)
Glucose, Bld: 174 mg/dL — ABNORMAL HIGH (ref 70–99)
Potassium: 3.6 mmol/L (ref 3.5–5.1)
Sodium: 137 mmol/L (ref 135–145)
Total Bilirubin: 0.4 mg/dL (ref 0.3–1.2)
Total Protein: 4.9 g/dL — ABNORMAL LOW (ref 6.5–8.1)

## 2019-06-03 LAB — CBC WITH DIFFERENTIAL/PLATELET
Abs Immature Granulocytes: 0.89 10*3/uL — ABNORMAL HIGH (ref 0.00–0.07)
Basophils Absolute: 0 10*3/uL (ref 0.0–0.1)
Basophils Relative: 0 %
Eosinophils Absolute: 0 10*3/uL (ref 0.0–0.5)
Eosinophils Relative: 0 %
HCT: 25.4 % — ABNORMAL LOW (ref 39.0–52.0)
Hemoglobin: 8.2 g/dL — ABNORMAL LOW (ref 13.0–17.0)
Immature Granulocytes: 3 %
Lymphocytes Relative: 1 %
Lymphs Abs: 0.3 10*3/uL — ABNORMAL LOW (ref 0.7–4.0)
MCH: 29.7 pg (ref 26.0–34.0)
MCHC: 32.3 g/dL (ref 30.0–36.0)
MCV: 92 fL (ref 80.0–100.0)
Monocytes Absolute: 0.4 10*3/uL (ref 0.1–1.0)
Monocytes Relative: 1 %
Neutro Abs: 25.8 10*3/uL — ABNORMAL HIGH (ref 1.7–7.7)
Neutrophils Relative %: 95 %
Platelets: 804 10*3/uL — ABNORMAL HIGH (ref 150–400)
RBC: 2.76 MIL/uL — ABNORMAL LOW (ref 4.22–5.81)
RDW: 12.3 % (ref 11.5–15.5)
WBC: 27.3 10*3/uL — ABNORMAL HIGH (ref 4.0–10.5)
nRBC: 0 % (ref 0.0–0.2)

## 2019-06-03 LAB — PHOSPHORUS: Phosphorus: 2.6 mg/dL (ref 2.5–4.6)

## 2019-06-03 LAB — MAGNESIUM: Magnesium: 1.8 mg/dL (ref 1.7–2.4)

## 2019-06-03 LAB — URIC ACID: Uric Acid, Serum: 2.7 mg/dL — ABNORMAL LOW (ref 3.7–8.6)

## 2019-06-03 MED ORDER — VINCRISTINE SULFATE CHEMO INJECTION 1 MG/ML
Freq: Once | INTRAVENOUS | Status: AC
  Administered 2019-06-03: 14:00:00 via INTRAVENOUS
  Filled 2019-06-03: qty 10

## 2019-06-03 MED ORDER — SODIUM CHLORIDE 0.9 % IV SOLN
INTRAVENOUS | Status: DC
  Administered 2019-06-03: 13:00:00 via INTRAVENOUS

## 2019-06-03 MED ORDER — SODIUM CHLORIDE 0.9% FLUSH
10.0000 mL | INTRAVENOUS | Status: DC | PRN
  Administered 2019-06-03: 10 mL via INTRAVENOUS
  Filled 2019-06-03: qty 10

## 2019-06-03 MED ORDER — SODIUM CHLORIDE 0.9 % IV SOLN
Freq: Once | INTRAVENOUS | Status: AC
  Administered 2019-06-03: 8 mg via INTRAVENOUS
  Filled 2019-06-03: qty 4

## 2019-06-03 NOTE — Patient Instructions (Addendum)
[facility] Discharge Instructions for Patients Receiving Chemotherapy   Beginning January 23rd 2017 lab work for the [facility] will be done in the  Main lab at [facility] on 1st floor. If you have a lab appointment with the [facility] please come in thru the  Main Entrance and check in at the main information desk   Today you received the following chemotherapy agents Adriamycin,VP-16 and Vincristine via pump. Follow-up as scheduled. Call clinic for any questions or concerns  To help prevent nausea and vomiting after your treatment, we encourage you to take your nausea medication   If you develop nausea and vomiting, or diarrhea that is not controlled by your medication, call the clinic.  The clinic phone number is [phone]. Office hours are Monday-Friday 8:30am-5:00pm.  BELOW ARE SYMPTOMS THAT SHOULD BE REPORTED IMMEDIATELY:  *FEVER GREATER THAN 101.0 F  *CHILLS WITH OR WITHOUT FEVER  NAUSEA AND VOMITING THAT IS NOT CONTROLLED WITH YOUR NAUSEA MEDICATION  *UNUSUAL SHORTNESS OF BREATH  *UNUSUAL BRUISING OR BLEEDING  TENDERNESS IN MOUTH AND THROAT WITH OR WITHOUT PRESENCE OF ULCERS  *URINARY PROBLEMS  *BOWEL PROBLEMS  UNUSUAL RASH Items with * indicate a potential emergency and should be followed up as soon as possible. If you have an emergency after office hours please contact your primary care physician or go to the nearest emergency department.  Please call the clinic during office hours if you have any questions or concerns.   You may also contact the Patient Navigator at [phone] should you have any questions or need assistance in obtaining follow up care.      Resources For Cancer Patients and their Caregivers ? American Cancer Society: Can assist with transportation, wigs, general needs, runs Look Good Feel Better.        7254340901 ? Cancer Care: Provides financial assistance, online support groups, medication/co-pay  assistance.  1-800-813-HOPE 231-693-7289) ? [employer] Assists [place] cancer patients and their families through emotional , educational and financial support.  (520) 194-4945 ? [place] DSS Where to apply for food stamps, Medicaid and utility assistance. (770)695-9185 ? [employer]: Transportation to medical appointments. 229-062-9708 ? Social Security Administration: May apply for disability if have a Stage IV cancer. 270-776-0522 (262)345-1429 ? [employer], Disability and Transit Services: Assists with nutrition, care and transit needs. [phone]

## 2019-06-03 NOTE — Progress Notes (Signed)
Kevin [name] tolerated ambulatory infusion pump well without complaints or incident. Infusion pump medication changed after labs drawn from portacath. VSS Pt discharged with infusion pump infusing without issues

## 2019-06-04 ENCOUNTER — Ambulatory Visit (HOSPITAL_COMMUNITY): Payer: BLUE CROSS/BLUE SHIELD

## 2019-06-04 ENCOUNTER — Other Ambulatory Visit: Payer: Self-pay

## 2019-06-04 ENCOUNTER — Inpatient Hospital Stay (HOSPITAL_COMMUNITY): Payer: BLUE CROSS/BLUE SHIELD

## 2019-06-04 VITALS — BP 141/58 | HR 77 | Resp 16

## 2019-06-04 DIAGNOSIS — C8338 Diffuse large B-cell lymphoma, lymph nodes of multiple sites: Secondary | ICD-10-CM | POA: Diagnosis not present

## 2019-06-04 DIAGNOSIS — Z5112 Encounter for antineoplastic immunotherapy: Secondary | ICD-10-CM | POA: Diagnosis not present

## 2019-06-04 DIAGNOSIS — Z5189 Encounter for other specified aftercare: Secondary | ICD-10-CM | POA: Diagnosis not present

## 2019-06-04 DIAGNOSIS — Z5111 Encounter for antineoplastic chemotherapy: Secondary | ICD-10-CM | POA: Diagnosis not present

## 2019-06-04 DIAGNOSIS — Z95828 Presence of other vascular implants and grafts: Secondary | ICD-10-CM

## 2019-06-04 MED ORDER — SODIUM CHLORIDE 0.9 % IV SOLN
INTRAVENOUS | Status: DC
  Administered 2019-06-04: 13:00:00 via INTRAVENOUS

## 2019-06-04 MED ORDER — SODIUM CHLORIDE 0.9% FLUSH
10.0000 mL | Freq: Once | INTRAVENOUS | Status: AC
  Administered 2019-06-04: 10 mL via INTRAVENOUS

## 2019-06-04 MED ORDER — SODIUM CHLORIDE 0.9 % IV SOLN
Freq: Once | INTRAVENOUS | Status: AC
  Administered 2019-06-04: 16 mg via INTRAVENOUS
  Filled 2019-06-04: qty 8

## 2019-06-04 MED ORDER — SODIUM CHLORIDE 0.9 % IV SOLN
750.0000 mg/m2 | Freq: Once | INTRAVENOUS | Status: AC
  Administered 2019-06-04: 1560 mg via INTRAVENOUS
  Filled 2019-06-04: qty 78

## 2019-06-04 MED ORDER — HEPARIN SOD (PORK) LOCK FLUSH 100 UNIT/ML IV SOLN
500.0000 [IU] | Freq: Once | INTRAVENOUS | Status: AC
  Administered 2019-06-04: 500 [IU] via INTRAVENOUS

## 2019-06-04 NOTE — Progress Notes (Signed)
Treatment given per orders. Patient tolerated it well without problems. Vitals stable and discharged home from clinic ambulatory. Follow up as scheduled.  

## 2019-06-06 DIAGNOSIS — I272 Pulmonary hypertension, unspecified: Secondary | ICD-10-CM | POA: Diagnosis not present

## 2019-06-06 DIAGNOSIS — J9601 Acute respiratory failure with hypoxia: Secondary | ICD-10-CM | POA: Diagnosis not present

## 2019-06-07 ENCOUNTER — Other Ambulatory Visit: Payer: Self-pay

## 2019-06-07 ENCOUNTER — Inpatient Hospital Stay (HOSPITAL_COMMUNITY): Payer: BLUE CROSS/BLUE SHIELD

## 2019-06-07 ENCOUNTER — Other Ambulatory Visit (HOSPITAL_COMMUNITY): Payer: Self-pay | Admitting: *Deleted

## 2019-06-07 ENCOUNTER — Inpatient Hospital Stay (HOSPITAL_COMMUNITY): Payer: BLUE CROSS/BLUE SHIELD | Admitting: Hematology

## 2019-06-07 ENCOUNTER — Encounter (HOSPITAL_COMMUNITY): Payer: Self-pay | Admitting: Hematology

## 2019-06-07 VITALS — BP 124/56 | HR 64 | Temp 96.9°F | Resp 18 | Wt 186.0 lb

## 2019-06-07 DIAGNOSIS — N179 Acute kidney failure, unspecified: Secondary | ICD-10-CM

## 2019-06-07 DIAGNOSIS — C8338 Diffuse large B-cell lymphoma, lymph nodes of multiple sites: Secondary | ICD-10-CM | POA: Diagnosis not present

## 2019-06-07 DIAGNOSIS — Z5112 Encounter for antineoplastic immunotherapy: Secondary | ICD-10-CM | POA: Diagnosis not present

## 2019-06-07 DIAGNOSIS — Z5111 Encounter for antineoplastic chemotherapy: Secondary | ICD-10-CM | POA: Diagnosis not present

## 2019-06-07 DIAGNOSIS — Z5189 Encounter for other specified aftercare: Secondary | ICD-10-CM | POA: Diagnosis not present

## 2019-06-07 DIAGNOSIS — N3000 Acute cystitis without hematuria: Secondary | ICD-10-CM | POA: Diagnosis not present

## 2019-06-07 DIAGNOSIS — Z95828 Presence of other vascular implants and grafts: Secondary | ICD-10-CM

## 2019-06-07 LAB — URINALYSIS, ROUTINE W REFLEX MICROSCOPIC
Bilirubin Urine: NEGATIVE
Glucose, UA: NEGATIVE mg/dL
Ketones, ur: NEGATIVE mg/dL
Nitrite: NEGATIVE
Protein, ur: 100 mg/dL — AB
RBC / HPF: >50 RBC/hpf — ABNORMAL HIGH (ref 0–5)
Specific Gravity, Urine: 1.015 (ref 1.005–1.030)
pH: 7 (ref 5.0–8.0)

## 2019-06-07 LAB — CBC WITH DIFFERENTIAL/PLATELET
Abs Immature Granulocytes: 0.24 10*3/uL — ABNORMAL HIGH (ref 0.00–0.07)
Basophils Absolute: 0 10*3/uL (ref 0.0–0.1)
Basophils Relative: 0 %
Eosinophils Absolute: 0.2 10*3/uL (ref 0.0–0.5)
Eosinophils Relative: 1 %
HCT: 27.2 % — ABNORMAL LOW (ref 39.0–52.0)
Hemoglobin: 8.8 g/dL — ABNORMAL LOW (ref 13.0–17.0)
Immature Granulocytes: 2 %
Lymphocytes Relative: 5 %
Lymphs Abs: 0.8 10*3/uL (ref 0.7–4.0)
MCH: 29.1 pg (ref 26.0–34.0)
MCHC: 32.4 g/dL (ref 30.0–36.0)
MCV: 90.1 fL (ref 80.0–100.0)
Monocytes Absolute: 0 10*3/uL — ABNORMAL LOW (ref 0.1–1.0)
Monocytes Relative: 0 %
Neutro Abs: 13.4 10*3/uL — ABNORMAL HIGH (ref 1.7–7.7)
Neutrophils Relative %: 92 %
Platelets: 603 10*3/uL — ABNORMAL HIGH (ref 150–400)
RBC: 3.02 MIL/uL — ABNORMAL LOW (ref 4.22–5.81)
RDW: 12.1 % (ref 11.5–15.5)
WBC: 14.6 10*3/uL — ABNORMAL HIGH (ref 4.0–10.5)
nRBC: 0 % (ref 0.0–0.2)

## 2019-06-07 LAB — COMPREHENSIVE METABOLIC PANEL
ALT: 21 U/L (ref 0–44)
AST: 13 U/L — ABNORMAL LOW (ref 15–41)
Albumin: 2.7 g/dL — ABNORMAL LOW (ref 3.5–5.0)
Alkaline Phosphatase: 66 U/L (ref 38–126)
Anion gap: 9 (ref 5–15)
BUN: 24 mg/dL — ABNORMAL HIGH (ref 8–23)
CO2: 30 mmol/L (ref 22–32)
Calcium: 8.9 mg/dL (ref 8.9–10.3)
Chloride: 98 mmol/L (ref 98–111)
Creatinine, Ser: 0.47 mg/dL — ABNORMAL LOW (ref 0.61–1.24)
GFR calc Af Amer: >60 mL/min (ref >60)
GFR calc non Af Amer: >60 mL/min (ref >60)
Glucose, Bld: 108 mg/dL — ABNORMAL HIGH (ref 70–99)
Potassium: 4.1 mmol/L (ref 3.5–5.1)
Sodium: 137 mmol/L (ref 135–145)
Total Bilirubin: 0.7 mg/dL (ref 0.3–1.2)
Total Protein: 4.8 g/dL — ABNORMAL LOW (ref 6.5–8.1)

## 2019-06-07 LAB — URIC ACID: Uric Acid, Serum: 2.7 mg/dL — ABNORMAL LOW (ref 3.7–8.6)

## 2019-06-07 LAB — MAGNESIUM: Magnesium: 1.9 mg/dL (ref 1.7–2.4)

## 2019-06-07 LAB — PHOSPHORUS: Phosphorus: 4.1 mg/dL (ref 2.5–4.6)

## 2019-06-07 MED ORDER — LORAZEPAM 0.5 MG PO TABS: 0.5000 mg | ORAL_TABLET | Freq: Three times a day (TID) | ORAL | 2 refills | Status: DC

## 2019-06-07 MED ORDER — SODIUM CHLORIDE 0.9% FLUSH
10.0000 mL | Freq: Once | INTRAVENOUS | Status: AC | PRN
  Administered 2019-06-07: 10 mL

## 2019-06-07 MED ORDER — CIPROFLOXACIN HCL 500 MG PO TABS: 500.0000 mg | ORAL_TABLET | Freq: Two times a day (BID) | ORAL | 0 refills | Status: DC

## 2019-06-07 MED ORDER — HEPARIN SOD (PORK) LOCK FLUSH 100 UNIT/ML IV SOLN
500.0000 [IU] | Freq: Once | INTRAVENOUS | Status: AC | PRN
  Administered 2019-06-07: 11:00:00 500 [IU]

## 2019-06-07 MED ORDER — SODIUM CHLORIDE 0.9 % IV SOLN
Freq: Once | INTRAVENOUS | Status: AC
  Administered 2019-06-07: 09:00:00 via INTRAVENOUS
  Filled 2019-06-07: qty 1000

## 2019-06-07 MED ORDER — HYDROCODONE-ACETAMINOPHEN 5-325 MG PO TABS: 1.0000 | ORAL_TABLET | Freq: Three times a day (TID) | ORAL | 0 refills | Status: DC | PRN

## 2019-06-07 MED ORDER — PEGFILGRASTIM-JMDB 6 MG/0.6ML ~~LOC~~ SOSY
6.0000 mg | PREFILLED_SYRINGE | Freq: Once | SUBCUTANEOUS | Status: AC
  Administered 2019-06-07: 6 mg via SUBCUTANEOUS

## 2019-06-07 NOTE — Progress Notes (Signed)
0900 lab work reviewed and patient seen by Dr. Delton Coombes. Per MD, proceed with IV hydration and Fulphila today.  [name] tolerated hydration and Fulphila injection without incident or complaint. VSS. Discharged self ambulatory in satisfactory condition.

## 2019-06-07 NOTE — Assessment & Plan Note (Addendum)
1.  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 -PET scan on 05/03/2019 shows bulky intensely hypermetabolic peritoneal and retroperitoneal nodal mass, nodularity.  Additional hypermetabolic adenopathy involving the axilla, mediastinum, iliac and inguinal lymph nodes.  Multiple foci of discrete hypermetabolic metastases in the right iliac bone, left iliac crest, left and right scapula, and the C1 vertebral body. 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 -Cycle 1 of dose adjusted R-EPOCH on 05/11/2019. -Cycle 2-day 1 on 05/31/2019. -He tolerated rituximab during cycle 2 very well.  We had to give him over 2 days at fixed rate. -He is starting to feel slightly weak.  He had some sores in the mouth. -We will give him some fluids today.  He will receive full filler injection today. -We will evaluate him in 1 week.  He complained of burning micturition.  We have checked a UA.  It was borderline positive. -We will start him on Cipro twice daily.  I have also renewed hydrocodone 5/325 to be taken as needed for pain.  2.  Bilateral leg DVT: -Ultrasound Doppler on 04/18/2019 + for bilateral leg DVT. -He will continue Eliquis.  3.  Right hydronephrosis: -Cystoscopy and bilateral JJ stent placement on 04/21/2019.  He denies any hematuria at this time.  4.  Weight loss: -He lost about 25 pounds since start of cycle 1 on 05/11/2019. -He is eating better now and gaining weight.  5.  Hypophosphatemia: -He was started on Neutra-Phos 3 times a day.  Phosphate has normalized.  6.  Anxiety: -Patient reports that he is feeling anxious.  He used to take Ativan in the past which helped him. -I will send a prescription for Ativan 0.5 mg twice daily as needed.

## 2019-06-07 NOTE — Patient Instructions (Signed)
[place] at [facility]  Discharge Instructions:  IV fluids received today. _______________________________________________________________  Thank you for choosing [place] at [facility] to provide your oncology and hematology care.  To afford each patient quality time with our providers, please arrive at least 15 minutes before your scheduled appointment.  You need to re-schedule your appointment if you arrive 10 or more minutes late.  We strive to give you quality time with our providers, and arriving late affects you and other patients whose appointments are after yours.  Also, if you no show three or more times for appointments you may be dismissed from the clinic.  Again, thank you for choosing [place] at [place] hope is that these requests will allow you access to exceptional care and in a timely manner. _______________________________________________________________  If you have questions after your visit, please contact our office at [phone] between the hours of 8:30 a.m. and 5:00 p.m. Voicemails left after 4:30 p.m. will not be returned until the following business day. _______________________________________________________________  For prescription refill requests, have your pharmacy contact our office. _______________________________________________________________  Recommendations made by the consultant and any test results will be sent to your referring physician. _______________________________________________________________

## 2019-06-07 NOTE — Progress Notes (Signed)
Assessment & Plan:  1. Essential hypertension - Well controlled on current regimen.   2. Skin lesion - Encouraged to cleanse with mild soap and water and apply mupirocin ointment twice daily. - mupirocin ointment (BACTROBAN) 2 %; Place 1 application into the nose 2 (two) times daily.  Dispense: 22 g; Refill: 0   Return in about 6 months (around 12/08/2019) for follow-up of chronic medication conditions.  Hendricks Limes, MSN, APRN, FNP-C [facility]  Subjective:    Patient ID: Kevin [name], male    DOB: 09/29/1955, 63 y.o.   MRN: [id number]  Patient Care Team: Loman Brooklyn, FNP as PCP - General (Family Medicine)   Chief Complaint:  Chief Complaint  Patient presents with  . Hypertension    four week recheck    HPI: Kevin [name] is a 63 y.o. male presenting on 06/09/2019 for Hypertension (four week recheck)  Hypertension: Patient here for follow-up of elevated blood pressure. Patient was last seen by me on 04/27/2019 at which time he had run out of his blood pressure medications.  Refills were sent for losartan 100 mg once daily, metoprolol 50 mg once daily and amlodipine 10 mg once daily.  He is not exercising and is adherent to low salt diet.  Cardiac symptoms none.  Cardiovascular risk factors: advanced age (older than 16 for men, 23 for women), dyslipidemia, hypertension, male gender and obesity (BMI >= 30 kg/m2). Use of agents associated with hypertension: none. History of target organ damage: none.   New complaints: None  Social history:  Relevant past medical, surgical, family and social history reviewed and updated as indicated. Interim medical history since our last visit reviewed.  Allergies and medications reviewed and updated.  DATA REVIEWED: CHART IN EPIC  ROS: Negative unless specifically indicated above in HPI.    Current Outpatient Medications:  .  allopurinol (ZYLOPRIM) 300 MG tablet, Take 1 tablet (300 mg total) by mouth  daily., Disp: 30 tablet, Rfl: 3 .  amLODipine (NORVASC) 10 MG tablet, Take 1 tablet (10 mg total) by mouth daily., Disp: 90 tablet, Rfl: 1 .  apixaban (ELIQUIS) 5 MG TABS tablet, Take 1 tablet (5 mg total) by mouth 2 (two) times daily., Disp: 60 tablet, Rfl: 3 .  ciprofloxacin (CIPRO) 500 MG tablet, Take 1 tablet (500 mg total) by mouth 2 (two) times daily., Disp: 14 tablet, Rfl: 0 .  CYCLOPHOSPHAMIDE IV, Inject into the vein every 21 ( twenty-one) days., Disp: , Rfl:  .  docusate sodium (COLACE) 100 MG capsule, Take 100 mg by mouth 2 (two) times daily., Disp: , Rfl:  .  DOXORUBICIN HCL IV, Inject into the vein every 21 ( twenty-one) days., Disp: , Rfl:  .  ETOPOSIDE IV, Inject into the vein., Disp: , Rfl:  .  HYDROcodone-acetaminophen (NORCO/VICODIN) 5-325 MG tablet, Take 1 tablet by mouth every 8 (eight) hours as needed for moderate pain., Disp: 30 tablet, Rfl: 0 .  K Phos Mono-Sod Phos Di & Mono (K-PHOS-NEUTRAL) [phone] MG TABS, Take 1 tablet by mouth 3 (three) times daily., Disp: 90 tablet, Rfl: 0 .  LORazepam (ATIVAN) 0.5 MG tablet, Take 1 tablet (0.5 mg total) by mouth every 8 (eight) hours., Disp: 60 tablet, Rfl: 2 .  losartan (COZAAR) 100 MG tablet, Take 1 tablet (100 mg total) by mouth daily., Disp: 90 tablet, Rfl: 1 .  metoprolol succinate (TOPROL-XL) 50 MG 24 hr tablet, Take 1 tablet (50 mg total) by mouth daily., Disp: 90 tablet,  Rfl: 1 .  prochlorperazine (COMPAZINE) 10 MG tablet, Take 10 mg by mouth every 6 (six) hours as needed for nausea or vomiting., Disp: , Rfl:  .  RITUXIMAB IV, Inject into the vein every 21 ( twenty-one) days., Disp: , Rfl:  .  vinCRIStine 2 mg in sodium chloride 0.9 % 50 mL, Inject 2 mg into the vein every 21 ( twenty-one) days., Disp: , Rfl:  .  mupirocin ointment (BACTROBAN) 2 %, Place 1 application into the nose 2 (two) times daily., Disp: 22 g, Rfl: 0 .  OXYGEN, Inhale 2 L into the lungs 3 (three) times daily as needed (oxygen level below 93). , Disp: ,  Rfl:  No current facility-administered medications for this visit.   Facility-Administered Medications Ordered in Other Visits:  .  0.9 %  sodium chloride infusion, , Intravenous, Continuous, Derek Jack, MD, Stopped at 06/04/19 1304   No Known Allergies Past Medical History:  Diagnosis Date  . Diffuse large B cell lymphoma ([place]) 04/26/2019  . Hyperlipidemia 02/01/2014  . Hypertension   . Leg DVT (deep venous thromboembolism), acute, bilateral ([place]) 04/18/2019  . Port-A-Cath in place 04/27/2019    Past Surgical History:  Procedure Laterality Date  . CYSTOSCOPY W/ URETERAL STENT PLACEMENT Bilateral 04/21/2019   Procedure: CYSTOSCOPY WITH RETROGRADE PYELOGRAM/URETERAL STENT PLACEMENT;  Surgeon: Cleon Gustin, MD;  Location: [facility];  Service: Urology;  Laterality: Bilateral;  . INGUINAL LYMPH NODE BIOPSY Right 04/21/2019   Procedure: INGUINAL LYMPH NODE BIOPSY;  Surgeon: Virl Cagey, MD;  Location: [facility];  Service: General;  Laterality: Right;  . PORTACATH PLACEMENT Left 05/07/2019   Procedure: INSERTION PORT-A-CATH (catheter left subclavian);  Surgeon: Virl Cagey, MD;  Location: [facility];  Service: General;  Laterality: Left;    Social History   Socioeconomic History  . Marital status: Married    Spouse name: Not on file  . Number of children: Not on file  . Years of education: Not on file  . Highest education level: Not on file  Occupational History  . Not on file  Social Needs  . Financial resource strain: Not on file  . Food insecurity    Worry: Not on file    Inability: Not on file  . Transportation needs    Medical: Not on file    Non-medical: Not on file  Tobacco Use  . Smoking status: Former Smoker    Quit date: 04/27/1999    Years since quitting: 20.1  . Smokeless tobacco: Never Used  Substance and Sexual Activity  . Alcohol use: Yes    Alcohol/week: 56.0 standard drinks    Types: 56 Cans of beer per week  . Drug use: Never  . Sexual  activity: Not on file  Lifestyle  . Physical activity    Days per week: Not on file    Minutes per session: Not on file  . Stress: Not on file  Relationships  . Social Herbalist on phone: Not on file    Gets together: Not on file    Attends religious service: Not on file    Active member of club or organization: Not on file    Attends meetings of clubs or organizations: Not on file    Relationship status: Not on file  . Intimate partner violence    Fear of current or ex partner: Not on file    Emotionally abused: Not on file    Physically abused: Not on file  Forced sexual activity: Not on file  Other Topics Concern  . Not on file  Social History Narrative  . Not on file        Objective:    BP 103/67   Pulse 76   Temp (!) 97.3 F (36.3 C) (Temporal)   Ht 5\' 6"  (1.676 m)   Wt 177 lb 6.4 oz (80.5 kg)   SpO2 97%   BMI 28.63 kg/m   Physical Exam Vitals signs reviewed.  Constitutional:      General: He is not in acute distress.    Appearance: Normal appearance. He is overweight. He is not ill-appearing, toxic-appearing or diaphoretic.  HENT:     Head: Normocephalic and atraumatic.  Eyes:     General: No scleral icterus.       Right eye: No discharge.        Left eye: No discharge.     Conjunctiva/sclera: Conjunctivae normal.  Neck:     Musculoskeletal: Normal range of motion.  Cardiovascular:     Rate and Rhythm: Normal rate and regular rhythm.     Heart sounds: Normal heart sounds. No murmur. No friction rub. No gallop.   Pulmonary:     Effort: Pulmonary effort is normal. No respiratory distress.     Breath sounds: Normal breath sounds. No stridor. No wheezing, rhonchi or rales.  Musculoskeletal: Normal range of motion.  Skin:    General: Skin is warm and dry.     Findings: Lesion (scratch to right side of chin with mild erythema surrouding, no drainage) present.  Neurological:     Mental Status: He is alert and oriented to person, place, and  time. Mental status is at baseline.  Psychiatric:        Mood and Affect: Mood normal.        Behavior: Behavior normal.        Thought Content: Thought content normal.        Judgment: Judgment normal.     No results found for: [facility] Lab Results  Component Value Date   WBC 14.6 (H) 06/07/2019   HGB 8.8 (L) 06/07/2019   HCT 27.2 (L) 06/07/2019   MCV 90.1 06/07/2019   PLT 603 (H) 06/07/2019   Lab Results  Component Value Date   NA 137 06/07/2019   K 4.1 06/07/2019   CO2 30 06/07/2019   GLUCOSE 108 (H) 06/07/2019   BUN 24 (H) 06/07/2019   CREATININE 0.47 (L) 06/07/2019   BILITOT 0.7 06/07/2019   ALKPHOS 66 06/07/2019   AST 13 (L) 06/07/2019   ALT 21 06/07/2019   PROT 4.8 (L) 06/07/2019   ALBUMIN 2.7 (L) 06/07/2019   CALCIUM 8.9 06/07/2019   ANIONGAP 9 06/07/2019   No results found for: CHOL No results found for: HDL No results found for: [facility] Lab Results  Component Value Date   TRIG 181 (H) 04/17/2019   No results found for: CHOLHDL No results found for: HGBA1C

## 2019-06-07 NOTE — Progress Notes (Signed)
[place], [place] [address]   CLINIC:  Medical Oncology/Hematology  PCP:  Loman [place], [place] [id number] [phone]   REASON FOR VISIT:  Follow-up for high-grade lymphoma.  CURRENT THERAPY: Dose adjusted R-EPOCH  BRIEF ONCOLOGIC HISTORY:  Oncology History  Diffuse large B-cell lymphoma of lymph nodes of multiple regions ([place])  04/26/2019 Initial Diagnosis   Diffuse large B-cell lymphoma of lymph nodes of multiple regions ([place])   05/10/2019 -  Chemotherapy   The patient had pegfilgrastim (NEULASTA ONPRO KIT) injection 6 mg, 6 mg, Subcutaneous, Once, 1 of 1 cycle pegfilgrastim-jmdb (FULPHILA) injection 6 mg, 6 mg, Subcutaneous,  Once, 2 of 4 cycles Administration: 6 mg (05/18/2019) DOXOrubicin (ADRIAMYCIN) 20 mg, etoposide (VEPESID) 104 mg, vinCRIStine (ONCOVIN) 0.8 mg in sodium chloride 0.9 % 500 mL chemo infusion, , Intravenous, Once, 2 of 4 cycles Administration:  (05/11/2019),  (05/31/2019),  (06/01/2019),  (05/12/2019),  (05/13/2019),  (05/14/2019),  (06/02/2019),  (06/03/2019) ondansetron (ZOFRAN) 8 mg in sodium chloride 0.9 % 50 mL IVPB, , Intravenous,  Once, 2 of 4 cycles Administration: 8 mg (05/10/2019), 8 mg (05/11/2019), 16 mg (05/17/2019), 8 mg (06/01/2019), 16 mg (06/04/2019), 8 mg (05/12/2019), 8 mg (05/13/2019), 8 mg (05/14/2019), 8 mg (06/02/2019), 8 mg (06/03/2019) cyclophosphamide (CYTOXAN) 1,560 mg in sodium chloride 0.9 % 250 mL chemo infusion, 750 mg/m2 = 1,560 mg, Intravenous,  Once, 2 of 4 cycles Administration: 1,560 mg (05/17/2019), 1,560 mg (06/04/2019) riTUXimab-pvvr (RUXIENCE) 800 mg in sodium chloride 0.9 % 250 mL (2.4242 mg/mL) infusion, 375 mg/m2 = 800 mg, Intravenous,  Once, 2 of 4 cycles Dose modification: 100 mg (original dose 375 mg/m2, Cycle 1, Reason: Other (see comments), Comment: test dose), 600 mg (original dose 375 mg/m2, Cycle 1, Reason: Other (see comments), Comment: due to  previous reaction giving test dose first), 400 mg (original dose 375 mg/m2, Cycle 2, Reason: Other (see comments), Comment: remainder of day before fluid - do not bill) Administration: 800 mg (05/10/2019), 800 mg (05/31/2019), 100 mg (05/17/2019), 400 mg (06/01/2019)  for chemotherapy treatment.    05/11/2019 - 05/11/2019 Chemotherapy   The patient had DOXOrubicin (ADRIAMYCIN) chemo injection 104 mg, 50 mg/m2, Intravenous,  Once, 0 of 6 cycles palonosetron (ALOXI) injection 0.25 mg, 0.25 mg, Intravenous,  Once, 0 of 6 cycles pegfilgrastim-jmdb (FULPHILA) injection 6 mg, 6 mg, Subcutaneous,  Once, 0 of 6 cycles vinCRIStine (ONCOVIN) 2 mg in sodium chloride 0.9 % 50 mL chemo infusion, 2 mg, Intravenous,  Once, 0 of 6 cycles cyclophosphamide (CYTOXAN) 1,540 mg in sodium chloride 0.9 % 250 mL chemo infusion, 750 mg/m2, Intravenous,  Once, 0 of 6 cycles fosaprepitant (EMEND) 150 mg, dexamethasone (DECADRON) 12 mg in sodium chloride 0.9 % 145 mL IVPB, , Intravenous,  Once, 0 of 6 cycles riTUXimab-pvvr (RUXIENCE) 800 mg in sodium chloride 0.9 % 250 mL (2.4242 mg/mL) infusion, 375 mg/m2, Intravenous,  Once, 0 of 6 cycles  for chemotherapy treatment.       CANCER STAGING: Cancer Staging No matching staging information was found for the patient.   INTERVAL HISTORY:  Mr. Fesler 63 y.o. male seen for follow-up of double hit lymphoma.  He received his cycle 2 of chemotherapy last week.  He is beginning to start feeling weak.  However he is gaining weight.  He has some mucositis.  His water consumption has decreased.  Appetite is 75%.  Energy levels are 50%.  He is having to go to the bathroom  a lot and has sleep difficulty.  He complains of burning sensation after urination.  He denies seeing any blood in the urine.  REVIEW OF SYSTEMS:  Review of Systems  HENT:   Positive for mouth sores.   Genitourinary: Positive for dysuria.   Psychiatric/Behavioral: Positive for sleep disturbance.  All other  systems reviewed and are negative.    PAST MEDICAL/SURGICAL HISTORY:  Past Medical History:  Diagnosis Date  . Diffuse large B cell lymphoma ([place]) 04/26/2019  . Hyperlipidemia 02/01/2014  . Hypertension   . Leg DVT (deep venous thromboembolism), acute, bilateral ([place]) 04/18/2019  . Port-A-Cath in place 04/27/2019   Past Surgical History:  Procedure Laterality Date  . CYSTOSCOPY W/ URETERAL STENT PLACEMENT Bilateral 04/21/2019   Procedure: CYSTOSCOPY WITH RETROGRADE PYELOGRAM/URETERAL STENT PLACEMENT;  Surgeon: Cleon Gustin, MD;  Location: [facility];  Service: Urology;  Laterality: Bilateral;  . INGUINAL LYMPH NODE BIOPSY Right 04/21/2019   Procedure: INGUINAL LYMPH NODE BIOPSY;  Surgeon: Virl Cagey, MD;  Location: [facility];  Service: General;  Laterality: Right;  . PORTACATH PLACEMENT Left 05/07/2019   Procedure: INSERTION PORT-A-CATH (catheter left subclavian);  Surgeon: Virl Cagey, MD;  Location: [facility];  Service: General;  Laterality: Left;     SOCIAL HISTORY:  Social History   Socioeconomic History  . Marital status: Married    Spouse name: Not on file  . Number of children: Not on file  . Years of education: Not on file  . Highest education level: Not on file  Occupational History  . Not on file  Social Needs  . Financial resource strain: Not on file  . Food insecurity    Worry: Not on file    Inability: Not on file  . Transportation needs    Medical: Not on file    Non-medical: Not on file  Tobacco Use  . Smoking status: Former Smoker    Quit date: 04/27/1999    Years since quitting: 20.1  . Smokeless tobacco: Never Used  Substance and Sexual Activity  . Alcohol use: Yes    Alcohol/week: 56.0 standard drinks    Types: 56 Cans of beer per week  . Drug use: Never  . Sexual activity: Not on file  Lifestyle  . Physical activity    Days per week: Not on file    Minutes per session: Not on file  . Stress: Not on file  Relationships  . Social  Herbalist on phone: Not on file    Gets together: Not on file    Attends religious service: Not on file    Active member of club or organization: Not on file    Attends meetings of clubs or organizations: Not on file    Relationship status: Not on file  . Intimate partner violence    Fear of current or ex partner: Not on file    Emotionally abused: Not on file    Physically abused: Not on file    Forced sexual activity: Not on file  Other Topics Concern  . Not on file  Social History Narrative  . Not on file    FAMILY HISTORY:  Family History  Problem Relation Age of Onset  . Diabetes Brother   . Diabetes Mother   . Leukemia Brother   . Diabetes Son     CURRENT MEDICATIONS:  Outpatient Encounter Medications as of 06/07/2019  Medication Sig  . allopurinol (ZYLOPRIM) 300 MG tablet Take 1 tablet (300 mg  total) by mouth daily.  Marland Kitchen amLODipine (NORVASC) 10 MG tablet Take 1 tablet (10 mg total) by mouth daily.  Marland Kitchen apixaban (ELIQUIS) 5 MG TABS tablet Take 1 tablet (5 mg total) by mouth 2 (two) times daily.  . CYCLOPHOSPHAMIDE IV Inject into the vein every 21 ( twenty-one) days.  Marland Kitchen docusate sodium (COLACE) 100 MG capsule Take 100 mg by mouth 2 (two) times daily.  Marland Kitchen DOXORUBICIN HCL IV Inject into the vein every 21 ( twenty-one) days.  . ETOPOSIDE IV Inject into the vein.  . K Phos Mono-Sod Phos Di & Mono (K-PHOS-NEUTRAL) [phone] MG TABS Take 1 tablet by mouth 3 (three) times daily.  Marland Kitchen losartan (COZAAR) 100 MG tablet Take 1 tablet (100 mg total) by mouth daily.  . metoprolol succinate (TOPROL-XL) 50 MG 24 hr tablet Take 1 tablet (50 mg total) by mouth daily.  . OXYGEN Inhale 2 L into the lungs 3 (three) times daily as needed (oxygen level below 93).   Marland Kitchen RITUXIMAB IV Inject into the vein every 21 ( twenty-one) days.  . vinCRIStine 2 mg in sodium chloride 0.9 % 50 mL Inject 2 mg into the vein every 21 ( twenty-one) days.  . ciprofloxacin (CIPRO) 500 MG tablet Take 1 tablet  (500 mg total) by mouth 2 (two) times daily.  Marland Kitchen LORazepam (ATIVAN) 0.5 MG tablet Take 1 tablet (0.5 mg total) by mouth every 8 (eight) hours.  . prochlorperazine (COMPAZINE) 10 MG tablet Take 10 mg by mouth every 6 (six) hours as needed for nausea or vomiting.  . [DISCONTINUED] HYDROcodone-acetaminophen (NORCO/VICODIN) 5-325 MG tablet Take 1 tablet by mouth every 8 (eight) hours as needed for moderate pain.    Facility-Administered Encounter Medications as of 06/07/2019  Medication  . 0.9 %  sodium chloride infusion    ALLERGIES:  No Known Allergies   PHYSICAL EXAM:  ECOG Performance status: 1  Vitals:   06/07/19 0840  BP: (!) 124/56  Pulse: 64  Resp: 18  Temp: (!) 96.9 F (36.1 C)  SpO2: 98%   Filed Weights   06/07/19 0840  Weight: 186 lb (84.4 kg)    Physical Exam Vitals signs reviewed.  Constitutional:      Appearance: Normal appearance.  Cardiovascular:     Rate and Rhythm: Normal rate and regular rhythm.     Heart sounds: Normal heart sounds.  Pulmonary:     Effort: Pulmonary effort is normal.     Breath sounds: Normal breath sounds.  Abdominal:     General: There is no distension.     Palpations: Abdomen is soft. There is no mass.  Musculoskeletal:        General: No swelling.  Skin:    General: Skin is warm.  Neurological:     General: No focal deficit present.     Mental Status: He is alert and oriented to person, place, and time.  Psychiatric:        Mood and Affect: Mood normal.        Behavior: Behavior normal.    Bilateral inguinal adenopathy.  LABORATORY DATA:  I have reviewed the labs as listed.  CBC    Component Value Date/Time   WBC 14.6 (H) 06/07/2019 0827   RBC 3.02 (L) 06/07/2019 0827   HGB 8.8 (L) 06/07/2019 0827   HCT 27.2 (L) 06/07/2019 0827   PLT 603 (H) 06/07/2019 0827   MCV 90.1 06/07/2019 0827   MCH 29.1 06/07/2019 0827   MCHC 32.4 06/07/2019 0827  RDW 12.1 06/07/2019 0827   LYMPHSABS 0.8 06/07/2019 0827   MONOABS 0.0  (L) 06/07/2019 0827   EOSABS 0.2 06/07/2019 0827   BASOSABS 0.0 06/07/2019 0827   CMP Latest Ref Rng & Units 06/07/2019 06/03/2019 06/01/2019  Glucose 70 - 99 mg/dL 108(H) 174(H) 139(H)  BUN 8 - 23 mg/dL 24(H) 24(H) 20  Creatinine 0.61 - 1.24 mg/dL 0.47(L) 0.61 0.72  Sodium 135 - 145 mmol/L 137 137 141  Potassium 3.5 - 5.1 mmol/L 4.1 3.6 3.9  Chloride 98 - 111 mmol/L 98 103 104  CO2 22 - 32 mmol/L '30 26 28  '$ Calcium 8.9 - 10.3 mg/dL 8.9 7.9(L) 8.3(L)  Total Protein 6.5 - 8.1 g/dL 4.8(L) 4.9(L) 5.7(L)  Total Bilirubin 0.3 - 1.2 mg/dL 0.7 0.4 0.3  Alkaline Phos 38 - 126 U/L 66 73 94  AST 15 - 41 U/L 13(L) 15 18  ALT 0 - 44 U/L '21 21 21       '$ DIAGNOSTIC IMAGING:  I have independently reviewed the scans and discussed with the patient.     ASSESSMENT & PLAN:   Diffuse large B-cell lymphoma of lymph nodes of multiple regions ([place]) 1.  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 -PET scan on 05/03/2019 shows bulky intensely hypermetabolic peritoneal and retroperitoneal nodal mass, nodularity.  Additional hypermetabolic adenopathy involving the axilla, mediastinum, iliac and inguinal lymph nodes.  Multiple foci of discrete hypermetabolic metastases in the right iliac bone, left iliac crest, left and right scapula, and the C1 vertebral body. 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 -Cycle 1 of dose adjusted R-EPOCH on 05/11/2019. -Cycle 2-day 1 on 05/31/2019. -He tolerated rituximab during cycle 2 very well.  We had to give him over 2 days at fixed rate. -He is starting to feel slightly weak.  He had some sores in the mouth. -We will give him some fluids today.  He will receive full filler injection today. -We will evaluate him in 1 week.  He complained of burning micturition.  We have checked a UA.  It was borderline positive. -We will start him  on Cipro twice daily.  I have also renewed hydrocodone 5/325 to be taken as needed for pain.  2.  Bilateral leg DVT: -Ultrasound Doppler on 04/18/2019 + for bilateral leg DVT. -He will continue Eliquis.  3.  Right hydronephrosis: -Cystoscopy and bilateral JJ stent placement on 04/21/2019.  He denies any hematuria at this time.  4.  Weight loss: -He lost about 25 pounds since start of cycle 1 on 05/11/2019. -He is eating better now and gaining weight.  5.  Hypophosphatemia: -He was started on Neutra-Phos 3 times a day.  Phosphate has normalized.  6.  Anxiety: -Patient reports that he is feeling anxious.  He used to take Ativan in the past which helped him. -I will send a prescription for Ativan 0.5 mg twice daily as needed.       Orders placed this encounter:  Orders Placed This Encounter  Procedures  . CBC with Differential/Platelet  . Comprehensive metabolic panel  . Magnesium  . Phosphorus  . Uric acid      Derek Jack, MD [place] [phone]

## 2019-06-07 NOTE — Patient Instructions (Addendum)
[facility] at [facility] Discharge Instructions  You were seen today by Dr. Katragadda. He went over your recent lab results. He will see you back in 1 week for labs and follow up.   Thank you for choosing [facility] at [facility] to provide your oncology and hematology care.  To afford each patient quality time with our provider, please arrive at least 15 minutes before your scheduled appointment time.   If you have a lab appointment with the Cancer Center please come in thru the  Main Entrance and check in at the main information desk  You need to re-schedule your appointment should you arrive 10 or more minutes late.  We strive to give you quality time with our providers, and arriving late affects you and other patients whose appointments are after yours.  Also, if you no show three or more times for appointments you may be dismissed from the clinic at the providers discretion.     Again, thank you for choosing [facility].  Our hope is that these requests will decrease the amount of time that you wait before being seen by our physicians.       _____________________________________________________________  Should you have questions after your visit to [facility], please contact our office at [phone] between the hours of 8:00 a.m. and 4:30 p.m.  Voicemails left after 4:00 p.m. will not be returned until the following business day.  For prescription refill requests, have your pharmacy contact our office and allow 72 hours.    Cancer Center Support Programs:   > Cancer Support Group  2nd Tuesday of the month 1pm-2pm, Journey Room    

## 2019-06-09 ENCOUNTER — Encounter: Payer: Self-pay | Admitting: Family Medicine

## 2019-06-09 ENCOUNTER — Other Ambulatory Visit: Payer: Self-pay

## 2019-06-09 ENCOUNTER — Ambulatory Visit: Payer: BLUE CROSS/BLUE SHIELD | Admitting: Family Medicine

## 2019-06-09 VITALS — BP 103/67 | HR 76 | Temp 97.3°F | Ht 66.0 in | Wt 177.4 lb

## 2019-06-09 DIAGNOSIS — L989 Disorder of the skin and subcutaneous tissue, unspecified: Secondary | ICD-10-CM | POA: Diagnosis not present

## 2019-06-09 DIAGNOSIS — I10 Essential (primary) hypertension: Secondary | ICD-10-CM | POA: Diagnosis not present

## 2019-06-09 MED ORDER — MUPIROCIN 2 % EX OINT: 1.0000 "application " | TOPICAL_OINTMENT | Freq: Two times a day (BID) | CUTANEOUS | 0 refills | Status: DC

## 2019-06-15 ENCOUNTER — Encounter (HOSPITAL_COMMUNITY): Payer: Self-pay | Admitting: Hematology

## 2019-06-15 ENCOUNTER — Inpatient Hospital Stay (HOSPITAL_COMMUNITY): Payer: BLUE CROSS/BLUE SHIELD

## 2019-06-15 ENCOUNTER — Inpatient Hospital Stay (HOSPITAL_COMMUNITY): Payer: BLUE CROSS/BLUE SHIELD | Admitting: Hematology

## 2019-06-15 ENCOUNTER — Other Ambulatory Visit: Payer: Self-pay

## 2019-06-15 DIAGNOSIS — C8338 Diffuse large B-cell lymphoma, lymph nodes of multiple sites: Secondary | ICD-10-CM | POA: Diagnosis not present

## 2019-06-15 DIAGNOSIS — Z5189 Encounter for other specified aftercare: Secondary | ICD-10-CM | POA: Diagnosis not present

## 2019-06-15 DIAGNOSIS — Z5111 Encounter for antineoplastic chemotherapy: Secondary | ICD-10-CM | POA: Diagnosis not present

## 2019-06-15 DIAGNOSIS — Z5112 Encounter for antineoplastic immunotherapy: Secondary | ICD-10-CM | POA: Diagnosis not present

## 2019-06-15 LAB — CBC WITH DIFFERENTIAL/PLATELET
Abs Immature Granulocytes: 5.6 10*3/uL — ABNORMAL HIGH (ref 0.00–0.07)
Basophils Absolute: 0.2 10*3/uL — ABNORMAL HIGH (ref 0.0–0.1)
Basophils Relative: 1 %
Eosinophils Absolute: 0.5 10*3/uL (ref 0.0–0.5)
Eosinophils Relative: 2 %
HCT: 28.3 % — ABNORMAL LOW (ref 39.0–52.0)
Hemoglobin: 8.6 g/dL — ABNORMAL LOW (ref 13.0–17.0)
Immature Granulocytes: 25 %
Lymphocytes Relative: 9 %
Lymphs Abs: 1.9 10*3/uL (ref 0.7–4.0)
MCH: 28.7 pg (ref 26.0–34.0)
MCHC: 30.4 g/dL (ref 30.0–36.0)
MCV: 94.3 fL (ref 80.0–100.0)
Monocytes Absolute: 1.9 10*3/uL — ABNORMAL HIGH (ref 0.1–1.0)
Monocytes Relative: 9 %
Neutro Abs: 11.9 10*3/uL — ABNORMAL HIGH (ref 1.7–7.7)
Neutrophils Relative %: 54 %
Platelets: 225 10*3/uL (ref 150–400)
RBC: 3 MIL/uL — ABNORMAL LOW (ref 4.22–5.81)
RDW: 13.9 % (ref 11.5–15.5)
WBC: 22.1 10*3/uL — ABNORMAL HIGH (ref 4.0–10.5)
nRBC: 0.5 % — ABNORMAL HIGH (ref 0.0–0.2)

## 2019-06-15 LAB — COMPREHENSIVE METABOLIC PANEL
ALT: 17 U/L (ref 0–44)
AST: 16 U/L (ref 15–41)
Albumin: 3 g/dL — ABNORMAL LOW (ref 3.5–5.0)
Alkaline Phosphatase: 98 U/L (ref 38–126)
Anion gap: 9 (ref 5–15)
BUN: 13 mg/dL (ref 8–23)
CO2: 28 mmol/L (ref 22–32)
Calcium: 8.9 mg/dL (ref 8.9–10.3)
Chloride: 103 mmol/L (ref 98–111)
Creatinine, Ser: 0.88 mg/dL (ref 0.61–1.24)
GFR calc Af Amer: >60 mL/min (ref >60)
GFR calc non Af Amer: >60 mL/min (ref >60)
Glucose, Bld: 146 mg/dL — ABNORMAL HIGH (ref 70–99)
Potassium: 4.5 mmol/L (ref 3.5–5.1)
Sodium: 140 mmol/L (ref 135–145)
Total Bilirubin: 0.3 mg/dL (ref 0.3–1.2)
Total Protein: 5.8 g/dL — ABNORMAL LOW (ref 6.5–8.1)

## 2019-06-15 LAB — URIC ACID: Uric Acid, Serum: 3.7 mg/dL (ref 3.7–8.6)

## 2019-06-15 LAB — PHOSPHORUS: Phosphorus: 4 mg/dL (ref 2.5–4.6)

## 2019-06-15 LAB — MAGNESIUM: Magnesium: 1.7 mg/dL (ref 1.7–2.4)

## 2019-06-15 MED ORDER — SODIUM CHLORIDE 0.9% FLUSH
10.0000 mL | Freq: Once | INTRAVENOUS | Status: AC
  Administered 2019-06-15: 10 mL via INTRAVENOUS

## 2019-06-15 MED ORDER — SODIUM CHLORIDE 0.9 % IV SOLN: INTRAVENOUS | Status: DC

## 2019-06-15 MED ORDER — HEPARIN SOD (PORK) LOCK FLUSH 100 UNIT/ML IV SOLN
500.0000 [IU] | Freq: Once | INTRAVENOUS | Status: AC
  Administered 2019-06-15: 500 [IU] via INTRAVENOUS

## 2019-06-15 NOTE — Progress Notes (Signed)
Verbal order given today to given 500 ml saline bolus over one hour.    Patient tolerated it well without problems. Vitals stable and discharged home from clinic ambulatory. Follow up as scheduled.

## 2019-06-15 NOTE — Patient Instructions (Signed)
[place] at [facility] Discharge Instructions  You were seen today by Dr. Delton Coombes. He went over your recent lab results and how you've been feeling since your last treatment. He will see you back in 1 week for labs, treatment and follow up.   Thank you for choosing [place] at [facility] to provide your oncology and hematology care.  To afford each patient quality time with our provider, please arrive at least 15 minutes before your scheduled appointment time.   If you have a lab appointment with the [facility] please come in thru the  Main Entrance and check in at the main information desk  You need to re-schedule your appointment should you arrive 10 or more minutes late.  We strive to give you quality time with our providers, and arriving late affects you and other patients whose appointments are after yours.  Also, if you no show three or more times for appointments you may be dismissed from the clinic at the providers discretion.     Again, thank you for choosing [facility].  Our hope is that these requests will decrease the amount of time that you wait before being seen by our physicians.       _____________________________________________________________  Should you have questions after your visit to [facility] please contact our office at [phone] between the hours of 8:00 a.m. and 4:30 p.m.  Voicemails left after 4:00 p.m. will not be returned until the following business day.  For prescription refill requests, have your pharmacy contact our office and allow 72 hours.    Cancer Center Support Programs:   > Cancer Support Group  2nd Tuesday of the month 1pm-2pm, Journey Room

## 2019-06-15 NOTE — Progress Notes (Signed)
[place], [place] [address]   CLINIC:  Medical Oncology/Hematology  PCP:  Loman [place], [place] [address] [phone]   REASON FOR VISIT:  Follow-up for high-grade lymphoma.  CURRENT THERAPY: Dose adjusted R-EPOCH  BRIEF ONCOLOGIC HISTORY:  Oncology History  Diffuse large B-cell lymphoma of lymph nodes of multiple regions ([place])  04/26/2019 Initial Diagnosis   Diffuse large B-cell lymphoma of lymph nodes of multiple regions ([place])   05/10/2019 -  Chemotherapy   The patient had pegfilgrastim (NEULASTA ONPRO KIT) injection 6 mg, 6 mg, Subcutaneous, Once, 1 of 1 cycle pegfilgrastim-jmdb (FULPHILA) injection 6 mg, 6 mg, Subcutaneous,  Once, 2 of 4 cycles Administration: 6 mg (05/18/2019), 6 mg (06/07/2019) DOXOrubicin (ADRIAMYCIN) 20 mg, etoposide (VEPESID) 104 mg, vinCRIStine (ONCOVIN) 0.8 mg in sodium chloride 0.9 % 500 mL chemo infusion, , Intravenous, Once, 2 of 4 cycles Administration:  (05/11/2019),  (05/31/2019),  (06/01/2019),  (05/12/2019),  (05/13/2019),  (05/14/2019),  (06/02/2019),  (06/03/2019) ondansetron (ZOFRAN) 8 mg in sodium chloride 0.9 % 50 mL IVPB, , Intravenous,  Once, 2 of 4 cycles Administration: 8 mg (05/10/2019), 8 mg (05/11/2019), 16 mg (05/17/2019), 8 mg (06/01/2019), 16 mg (06/04/2019), 8 mg (05/12/2019), 8 mg (05/13/2019), 8 mg (05/14/2019), 8 mg (06/02/2019), 8 mg (06/03/2019) cyclophosphamide (CYTOXAN) 1,560 mg in sodium chloride 0.9 % 250 mL chemo infusion, 750 mg/m2 = 1,560 mg, Intravenous,  Once, 2 of 4 cycles Administration: 1,560 mg (05/17/2019), 1,560 mg (06/04/2019) riTUXimab-pvvr (RUXIENCE) 800 mg in sodium chloride 0.9 % 250 mL (2.4242 mg/mL) infusion, 375 mg/m2 = 800 mg, Intravenous,  Once, 2 of 4 cycles Dose modification: 100 mg (original dose 375 mg/m2, Cycle 1, Reason: Other (see comments), Comment: test dose), 600 mg (original dose 375 mg/m2, Cycle 1, Reason: Other (see comments),  Comment: due to previous reaction giving test dose first), 400 mg (original dose 375 mg/m2, Cycle 2, Reason: Other (see comments), Comment: remainder of day before fluid - do not bill) Administration: 800 mg (05/10/2019), 800 mg (05/31/2019), 100 mg (05/17/2019), 400 mg (06/01/2019)  for chemotherapy treatment.    05/11/2019 - 05/11/2019 Chemotherapy   The patient had DOXOrubicin (ADRIAMYCIN) chemo injection 104 mg, 50 mg/m2, Intravenous,  Once, 0 of 6 cycles palonosetron (ALOXI) injection 0.25 mg, 0.25 mg, Intravenous,  Once, 0 of 6 cycles pegfilgrastim-jmdb (FULPHILA) injection 6 mg, 6 mg, Subcutaneous,  Once, 0 of 6 cycles vinCRIStine (ONCOVIN) 2 mg in sodium chloride 0.9 % 50 mL chemo infusion, 2 mg, Intravenous,  Once, 0 of 6 cycles cyclophosphamide (CYTOXAN) 1,540 mg in sodium chloride 0.9 % 250 mL chemo infusion, 750 mg/m2, Intravenous,  Once, 0 of 6 cycles fosaprepitant (EMEND) 150 mg, dexamethasone (DECADRON) 12 mg in sodium chloride 0.9 % 145 mL IVPB, , Intravenous,  Once, 0 of 6 cycles riTUXimab-pvvr (RUXIENCE) 800 mg in sodium chloride 0.9 % 250 mL (2.4242 mg/mL) infusion, 375 mg/m2, Intravenous,  Once, 0 of 6 cycles  for chemotherapy treatment.       CANCER STAGING: Cancer Staging No matching staging information was found for the patient.   INTERVAL HISTORY:  Mr. Belton 63 y.o. male seen for follow-up of double hit lymphoma.  Received cycle 2 on 05/31/2019.  Appetite is 50%.  Energy levels are 25%.  No pain is reported.  Abdominal pain completely improved.  Denies any fevers or chills.  Had a UTI which was treated with antibiotic.  Does not have any dysuria at this time.  Very occasional pink urine noted.  He is continuing Eliquis.  Denies any nausea or vomiting or diarrhea.  Anxiety is well controlled.  REVIEW OF SYSTEMS:  Review of Systems  All other systems reviewed and are negative.    PAST MEDICAL/SURGICAL HISTORY:  Past Medical History:  Diagnosis Date  . Diffuse  large B cell lymphoma ([place]) 04/26/2019  . Hyperlipidemia 02/01/2014  . Hypertension   . Leg DVT (deep venous thromboembolism), acute, bilateral ([place]) 04/18/2019  . Port-A-Cath in place 04/27/2019   Past Surgical History:  Procedure Laterality Date  . CYSTOSCOPY W/ URETERAL STENT PLACEMENT Bilateral 04/21/2019   Procedure: CYSTOSCOPY WITH RETROGRADE PYELOGRAM/URETERAL STENT PLACEMENT;  Surgeon: Cleon Gustin, MD;  Location: [facility];  Service: Urology;  Laterality: Bilateral;  . INGUINAL LYMPH NODE BIOPSY Right 04/21/2019   Procedure: INGUINAL LYMPH NODE BIOPSY;  Surgeon: Virl Cagey, MD;  Location: [facility];  Service: General;  Laterality: Right;  . PORTACATH PLACEMENT Left 05/07/2019   Procedure: INSERTION PORT-A-CATH (catheter left subclavian);  Surgeon: Virl Cagey, MD;  Location: [facility];  Service: General;  Laterality: Left;     SOCIAL HISTORY:  Social History   Socioeconomic History  . Marital status: Married    Spouse name: Not on file  . Number of children: Not on file  . Years of education: Not on file  . Highest education level: Not on file  Occupational History  . Not on file  Tobacco Use  . Smoking status: Former Smoker    Quit date: 04/27/1999    Years since quitting: 20.1  . Smokeless tobacco: Never Used  Substance and Sexual Activity  . Alcohol use: Yes    Alcohol/week: 56.0 standard drinks    Types: 56 Cans of beer per week  . Drug use: Never  . Sexual activity: Not on file  Other Topics Concern  . Not on file  Social History Narrative  . Not on file   Social Determinants of Health   Financial Resource Strain:   . Difficulty of Paying Living Expenses: Not on file  Food Insecurity:   . Worried About Charity fundraiser in the Last Year: Not on file  . Ran Out of Food in the Last Year: Not on file  Transportation Needs:   . Lack of Transportation (Medical): Not on file  . Lack of Transportation (Non-Medical): Not on file  Physical  Activity:   . Days of Exercise per Week: Not on file  . Minutes of Exercise per Session: Not on file  Stress:   . Feeling of Stress : Not on file  Social Connections:   . Frequency of Communication with Friends and Family: Not on file  . Frequency of Social Gatherings with Friends and Family: Not on file  . Attends Religious Services: Not on file  . Active Member of Clubs or Organizations: Not on file  . Attends Archivist Meetings: Not on file  . Marital Status: Not on file  Intimate Partner Violence:   . Fear of Current or Ex-Partner: Not on file  . Emotionally Abused: Not on file  . Physically Abused: Not on file  . Sexually Abused: Not on file    FAMILY HISTORY:  Family History  Problem Relation Age of Onset  . Diabetes Brother   . Diabetes Mother   . Leukemia Brother   . Diabetes Son     CURRENT MEDICATIONS:  Outpatient Encounter Medications as of 06/15/2019  Medication Sig  . allopurinol (  ZYLOPRIM) 300 MG tablet Take 1 tablet (300 mg total) by mouth daily.  Marland Kitchen amLODipine (NORVASC) 10 MG tablet Take 1 tablet (10 mg total) by mouth daily.  Marland Kitchen apixaban (ELIQUIS) 5 MG TABS tablet Take 1 tablet (5 mg total) by mouth 2 (two) times daily.  . ciprofloxacin (CIPRO) 500 MG tablet Take 1 tablet (500 mg total) by mouth 2 (two) times daily.  . CYCLOPHOSPHAMIDE IV Inject into the vein every 21 ( twenty-one) days.  Marland Kitchen docusate sodium (COLACE) 100 MG capsule Take 100 mg by mouth 2 (two) times daily.  Marland Kitchen DOXORUBICIN HCL IV Inject into the vein every 21 ( twenty-one) days.  . ETOPOSIDE IV Inject into the vein.  Marland Kitchen LORazepam (ATIVAN) 0.5 MG tablet Take 1 tablet (0.5 mg total) by mouth every 8 (eight) hours.  Marland Kitchen losartan (COZAAR) 100 MG tablet Take 1 tablet (100 mg total) by mouth daily.  . metoprolol succinate (TOPROL-XL) 50 MG 24 hr tablet Take 1 tablet (50 mg total) by mouth daily.  . mupirocin ointment (BACTROBAN) 2 % Place 1 application into the nose 2 (two) times daily.  .  prochlorperazine (COMPAZINE) 10 MG tablet Take 10 mg by mouth every 6 (six) hours as needed for nausea or vomiting.  Marland Kitchen RITUXIMAB IV Inject into the vein every 21 ( twenty-one) days.  . vinCRIStine 2 mg in sodium chloride 0.9 % 50 mL Inject 2 mg into the vein every 21 ( twenty-one) days.  . [DISCONTINUED] K Phos Mono-Sod Phos Di & Mono (K-PHOS-NEUTRAL) [phone] MG TABS Take 1 tablet by mouth 3 (three) times daily.  Marland Kitchen HYDROcodone-acetaminophen (NORCO/VICODIN) 5-325 MG tablet Take 1 tablet by mouth every 8 (eight) hours as needed for moderate pain.  . OXYGEN Inhale 2 L into the lungs 3 (three) times daily as needed (oxygen level below 93).    Facility-Administered Encounter Medications as of 06/15/2019  Medication  . 0.9 %  sodium chloride infusion  . 0.9 %  sodium chloride infusion    ALLERGIES:  No Known Allergies   PHYSICAL EXAM:  ECOG Performance status: 1  Vitals:   06/15/19 1100  BP: (!) 105/54  Pulse: 74  Resp: 16  Temp: (!) 97.5 F (36.4 C)  SpO2: 99%   Filed Weights   06/15/19 1100  Weight: 175 lb 9 oz (79.6 kg)    Physical Exam Vitals reviewed.  Constitutional:      Appearance: Normal appearance.  Cardiovascular:     Rate and Rhythm: Normal rate and regular rhythm.     Heart sounds: Normal heart sounds.  Pulmonary:     Effort: Pulmonary effort is normal.     Breath sounds: Normal breath sounds.  Abdominal:     General: There is no distension.     Palpations: Abdomen is soft. There is no mass.  Musculoskeletal:        General: No swelling.  Skin:    General: Skin is warm.  Neurological:     General: No focal deficit present.     Mental Status: He is alert and oriented to person, place, and time.  Psychiatric:        Mood and Affect: Mood normal.        Behavior: Behavior normal.      LABORATORY DATA:  I have reviewed the labs as listed.  CBC    Component Value Date/Time   WBC 22.1 (H) 06/15/2019 1106   RBC 3.00 (L) 06/15/2019 1106   HGB 8.6  (L) 06/15/2019 1106  HCT 28.3 (L) 06/15/2019 1106   PLT 225 06/15/2019 1106   MCV 94.3 06/15/2019 1106   MCH 28.7 06/15/2019 1106   MCHC 30.4 06/15/2019 1106   RDW 13.9 06/15/2019 1106   LYMPHSABS 1.9 06/15/2019 1106   MONOABS 1.9 (H) 06/15/2019 1106   EOSABS 0.5 06/15/2019 1106   BASOSABS 0.2 (H) 06/15/2019 1106   CMP Latest Ref Rng & Units 06/15/2019 06/07/2019 06/03/2019  Glucose 70 - 99 mg/dL 146(H) 108(H) 174(H)  BUN 8 - 23 mg/dL 13 24(H) 24(H)  Creatinine 0.61 - 1.24 mg/dL 0.88 0.47(L) 0.61  Sodium 135 - 145 mmol/L 140 137 137  Potassium 3.5 - 5.1 mmol/L 4.5 4.1 3.6  Chloride 98 - 111 mmol/L 103 98 103  CO2 22 - 32 mmol/L '28 30 26  '$ Calcium 8.9 - 10.3 mg/dL 8.9 8.9 7.9(L)  Total Protein 6.5 - 8.1 g/dL 5.8(L) 4.8(L) 4.9(L)  Total Bilirubin 0.3 - 1.2 mg/dL 0.3 0.7 0.4  Alkaline Phos 38 - 126 U/L 98 66 73  AST 15 - 41 U/L 16 13(L) 15  ALT 0 - 44 U/L '17 21 21       '$ DIAGNOSTIC IMAGING:  I have independently reviewed the scans and discussed with the patient.     ASSESSMENT & PLAN:   Diffuse large B-cell lymphoma of lymph nodes of multiple regions ([place]) 1.  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 -PET scan on 05/03/2019 shows bulky intensely hypermetabolic peritoneal and retroperitoneal nodal mass, nodularity.  Additional hypermetabolic adenopathy involving the axilla, mediastinum, iliac and inguinal lymph nodes.  Multiple foci of discrete hypermetabolic metastases in the right iliac bone, left iliac crest, left and right scapula, and the C1 vertebral body. 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 -Cycle 1 of dose adjusted R-EPOCH on 05/11/2019. -Cycle 2 was on 05/31/2019. -He tolerated rituximab during cycle 2 very well when we gave it over 2 days. -He lost about 5 pounds in the last 2 weeks.  He had some problems with UTI  which was corrected with antibiotic. -We reviewed his labs.  Creatinine increased 0.8 from his baseline around 0.6.  He is not drinking of fluids. -We will give him some IV hydration. -He will come back for labs on 06/18/2019.  Because of the holiday schedule, he will start his cycle 3-day 1 on 06/20/2019.  2.  Bilateral leg DVT: -Ultrasound Doppler on 04/18/2019 + for bilateral leg DVT. -He will continue Eliquis.  3.  Right hydronephrosis: -Cystoscopy and bilateral JJ stent placement on 04/21/2019.  He denies any hematuria at this time.  4.  Weight loss: -He lost about 25 pounds since start of cycle 1.  He gained some of it back. -He lost about 5 pounds in the last 2 weeks.  5.  Hypophosphatemia: -He will continue Neutra-Phos 3 times a day.  Phosphate has normalized.  6.  Anxiety: -He will use Ativan 0.5 mg twice daily as needed.       Orders placed this encounter:  No orders of the defined types were placed in this encounter.     Derek Jack, MD [place] [phone]

## 2019-06-16 ENCOUNTER — Other Ambulatory Visit (HOSPITAL_COMMUNITY): Payer: Self-pay | Admitting: Hematology

## 2019-06-17 ENCOUNTER — Other Ambulatory Visit (HOSPITAL_COMMUNITY): Payer: Self-pay | Admitting: Surgery

## 2019-06-17 ENCOUNTER — Telehealth (HOSPITAL_COMMUNITY): Payer: Self-pay | Admitting: Surgery

## 2019-06-17 DIAGNOSIS — C8338 Diffuse large B-cell lymphoma, lymph nodes of multiple sites: Secondary | ICD-10-CM

## 2019-06-17 MED ORDER — MAGIC MOUTHWASH: ORAL | 3 refills | Status: DC

## 2019-06-17 NOTE — Assessment & Plan Note (Signed)
1.  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 -PET scan on 05/03/2019 shows bulky intensely hypermetabolic peritoneal and retroperitoneal nodal mass, nodularity.  Additional hypermetabolic adenopathy involving the axilla, mediastinum, iliac and inguinal lymph nodes.  Multiple foci of discrete hypermetabolic metastases in the right iliac bone, left iliac crest, left and right scapula, and the C1 vertebral body. 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 -Cycle 1 of dose adjusted R-EPOCH on 05/11/2019. -Cycle 2 was on 05/31/2019. -He tolerated rituximab during cycle 2 very well when we gave it over 2 days. -He lost about 5 pounds in the last 2 weeks.  He had some problems with UTI which was corrected with antibiotic. -We reviewed his labs.  Creatinine increased 0.8 from his baseline around 0.6.  He is not drinking of fluids. -We will give him some IV hydration. -He will come back for labs on 06/18/2019.  Because of the holiday schedule, he will start his cycle 3-day 1 on 06/20/2019.  2.  Bilateral leg DVT: -Ultrasound Doppler on 04/18/2019 + for bilateral leg DVT. -He will continue Eliquis.  3.  Right hydronephrosis: -Cystoscopy and bilateral JJ stent placement on 04/21/2019.  He denies any hematuria at this time.  4.  Weight loss: -He lost about 25 pounds since start of cycle 1.  He gained some of it back. -He lost about 5 pounds in the last 2 weeks.  5.  Hypophosphatemia: -He will continue Neutra-Phos 3 times a day.  Phosphate has normalized.  6.  Anxiety: -He will use Ativan 0.5 mg twice daily as needed.

## 2019-06-17 NOTE — Telephone Encounter (Signed)
Pt's daughter had left a voicemail stating that her father had sores in his mouth and asking if magic mouth wash could be ordered.  I sent a message to [facility] and she ordered the magic mouth wash QID prn.  I called the pt's daughter back to let her know the medication was sent to [facility] and that it is a swish for 5 min then spit out.  She verbalized understanding.

## 2019-06-18 ENCOUNTER — Other Ambulatory Visit: Payer: Self-pay

## 2019-06-18 ENCOUNTER — Inpatient Hospital Stay (HOSPITAL_COMMUNITY): Payer: BLUE CROSS/BLUE SHIELD

## 2019-06-18 DIAGNOSIS — C8338 Diffuse large B-cell lymphoma, lymph nodes of multiple sites: Secondary | ICD-10-CM | POA: Diagnosis not present

## 2019-06-18 DIAGNOSIS — Z5112 Encounter for antineoplastic immunotherapy: Secondary | ICD-10-CM | POA: Diagnosis not present

## 2019-06-18 DIAGNOSIS — Z5189 Encounter for other specified aftercare: Secondary | ICD-10-CM | POA: Diagnosis not present

## 2019-06-18 DIAGNOSIS — Z5111 Encounter for antineoplastic chemotherapy: Secondary | ICD-10-CM | POA: Diagnosis not present

## 2019-06-18 LAB — CBC WITH DIFFERENTIAL/PLATELET
Abs Immature Granulocytes: 2.92 10*3/uL — ABNORMAL HIGH (ref 0.00–0.07)
Basophils Absolute: 0.3 10*3/uL — ABNORMAL HIGH (ref 0.0–0.1)
Basophils Relative: 2 %
Eosinophils Absolute: 0.3 10*3/uL (ref 0.0–0.5)
Eosinophils Relative: 2 %
HCT: 28.6 % — ABNORMAL LOW (ref 39.0–52.0)
Hemoglobin: 8.7 g/dL — ABNORMAL LOW (ref 13.0–17.0)
Immature Granulocytes: 17 %
Lymphocytes Relative: 10 %
Lymphs Abs: 1.8 10*3/uL (ref 0.7–4.0)
MCH: 28.5 pg (ref 26.0–34.0)
MCHC: 30.4 g/dL (ref 30.0–36.0)
MCV: 93.8 fL (ref 80.0–100.0)
Monocytes Absolute: 1.6 10*3/uL — ABNORMAL HIGH (ref 0.1–1.0)
Monocytes Relative: 9 %
Neutro Abs: 10.8 10*3/uL — ABNORMAL HIGH (ref 1.7–7.7)
Neutrophils Relative %: 60 %
Platelets: 245 10*3/uL (ref 150–400)
RBC: 3.05 MIL/uL — ABNORMAL LOW (ref 4.22–5.81)
RDW: 14.4 % (ref 11.5–15.5)
WBC Morphology: INCREASED
WBC: 17.6 10*3/uL — ABNORMAL HIGH (ref 4.0–10.5)
nRBC: 0.1 % (ref 0.0–0.2)

## 2019-06-18 LAB — COMPREHENSIVE METABOLIC PANEL
ALT: 13 U/L (ref 0–44)
AST: 15 U/L (ref 15–41)
Albumin: 3.1 g/dL — ABNORMAL LOW (ref 3.5–5.0)
Alkaline Phosphatase: 88 U/L (ref 38–126)
Anion gap: 6 (ref 5–15)
BUN: 20 mg/dL (ref 8–23)
CO2: 30 mmol/L (ref 22–32)
Calcium: 9 mg/dL (ref 8.9–10.3)
Chloride: 105 mmol/L (ref 98–111)
Creatinine, Ser: 0.71 mg/dL (ref 0.61–1.24)
GFR calc Af Amer: >60 mL/min (ref >60)
GFR calc non Af Amer: >60 mL/min (ref >60)
Glucose, Bld: 115 mg/dL — ABNORMAL HIGH (ref 70–99)
Potassium: 4.2 mmol/L (ref 3.5–5.1)
Sodium: 141 mmol/L (ref 135–145)
Total Bilirubin: 0.4 mg/dL (ref 0.3–1.2)
Total Protein: 6.1 g/dL — ABNORMAL LOW (ref 6.5–8.1)

## 2019-06-18 LAB — PHOSPHORUS: Phosphorus: 3.8 mg/dL (ref 2.5–4.6)

## 2019-06-18 LAB — MAGNESIUM: Magnesium: 1.8 mg/dL (ref 1.7–2.4)

## 2019-06-18 LAB — URIC ACID: Uric Acid, Serum: 3.4 mg/dL — ABNORMAL LOW (ref 3.7–8.6)

## 2019-06-20 ENCOUNTER — Encounter (HOSPITAL_COMMUNITY): Payer: Self-pay

## 2019-06-20 ENCOUNTER — Other Ambulatory Visit: Payer: Self-pay

## 2019-06-20 ENCOUNTER — Inpatient Hospital Stay (HOSPITAL_COMMUNITY): Payer: BLUE CROSS/BLUE SHIELD

## 2019-06-20 VITALS — BP 122/52 | HR 65 | Temp 97.5°F | Resp 16 | Ht 66.0 in | Wt 179.0 lb

## 2019-06-20 DIAGNOSIS — C8338 Diffuse large B-cell lymphoma, lymph nodes of multiple sites: Secondary | ICD-10-CM | POA: Diagnosis not present

## 2019-06-20 DIAGNOSIS — Z5189 Encounter for other specified aftercare: Secondary | ICD-10-CM | POA: Diagnosis not present

## 2019-06-20 DIAGNOSIS — Z5112 Encounter for antineoplastic immunotherapy: Secondary | ICD-10-CM | POA: Diagnosis not present

## 2019-06-20 DIAGNOSIS — Z5111 Encounter for antineoplastic chemotherapy: Secondary | ICD-10-CM | POA: Diagnosis not present

## 2019-06-20 DIAGNOSIS — Z95828 Presence of other vascular implants and grafts: Secondary | ICD-10-CM

## 2019-06-20 MED ORDER — HEPARIN SOD (PORK) LOCK FLUSH 100 UNIT/ML IV SOLN: 500.0000 [IU] | Freq: Once | INTRAVENOUS | Status: DC | PRN

## 2019-06-20 MED ORDER — SODIUM CHLORIDE 0.9 % IV SOLN
Freq: Once | INTRAVENOUS | Status: AC
  Administered 2019-06-20: 8 mg via INTRAVENOUS
  Filled 2019-06-20: qty 4

## 2019-06-20 MED ORDER — SODIUM CHLORIDE 0.9% FLUSH
10.0000 mL | INTRAVENOUS | Status: DC | PRN
  Administered 2019-06-20: 10 mL

## 2019-06-20 MED ORDER — SODIUM CHLORIDE 0.9 % IV SOLN: INTRAVENOUS | Status: DC

## 2019-06-20 MED ORDER — VINCRISTINE SULFATE CHEMO INJECTION 1 MG/ML
Freq: Once | INTRAVENOUS | Status: AC
  Filled 2019-06-20: qty 10

## 2019-06-20 NOTE — Progress Notes (Signed)
Labs reviewed on Friday by Francene Finders, NP, with verbal order ok to treat on Sunday.    To treatment room for chemo pump connect.  Labs reviewed for treatment today.  No complaints voiced with today's visit.  No s/s of distress noted.  Family at side.   Patient connected to chemotherapy pump. No complaints voiced with port.  Site clean and dry with no bruising or swelling noted.  Dressing intact.  Reviewed pump and tomorrows appointment with understanding verbalized.  Patient left ambulatory with no s/s of distress noted.

## 2019-06-21 ENCOUNTER — Inpatient Hospital Stay (HOSPITAL_COMMUNITY): Payer: BLUE CROSS/BLUE SHIELD

## 2019-06-21 ENCOUNTER — Other Ambulatory Visit (HOSPITAL_COMMUNITY): Payer: Self-pay | Admitting: Hematology

## 2019-06-21 ENCOUNTER — Inpatient Hospital Stay (HOSPITAL_COMMUNITY): Payer: BLUE CROSS/BLUE SHIELD | Admitting: Hematology

## 2019-06-21 ENCOUNTER — Encounter (HOSPITAL_COMMUNITY): Payer: Self-pay | Admitting: Hematology

## 2019-06-21 VITALS — BP 124/55 | HR 66 | Temp 96.9°F | Resp 18

## 2019-06-21 DIAGNOSIS — Z5189 Encounter for other specified aftercare: Secondary | ICD-10-CM | POA: Diagnosis not present

## 2019-06-21 DIAGNOSIS — Z95828 Presence of other vascular implants and grafts: Secondary | ICD-10-CM

## 2019-06-21 DIAGNOSIS — C8338 Diffuse large B-cell lymphoma, lymph nodes of multiple sites: Secondary | ICD-10-CM

## 2019-06-21 DIAGNOSIS — Z5111 Encounter for antineoplastic chemotherapy: Secondary | ICD-10-CM | POA: Diagnosis not present

## 2019-06-21 DIAGNOSIS — Z5112 Encounter for antineoplastic immunotherapy: Secondary | ICD-10-CM | POA: Diagnosis not present

## 2019-06-21 MED ORDER — VINCRISTINE SULFATE CHEMO INJECTION 1 MG/ML
Freq: Once | INTRAVENOUS | Status: AC
  Filled 2019-06-21: qty 10

## 2019-06-21 MED ORDER — FAMOTIDINE IN NACL 20-0.9 MG/50ML-% IV SOLN
20.0000 mg | Freq: Once | INTRAVENOUS | Status: AC
  Administered 2019-06-21: 09:00:00 20 mg via INTRAVENOUS
  Filled 2019-06-21: qty 50

## 2019-06-21 MED ORDER — SODIUM CHLORIDE 0.9 % IV SOLN
Freq: Once | INTRAVENOUS | Status: DC
  Filled 2019-06-21: qty 4

## 2019-06-21 MED ORDER — SODIUM CHLORIDE 0.9 % IV SOLN
375.0000 mg/m2 | Freq: Once | INTRAVENOUS | Status: AC
  Administered 2019-06-21: 800 mg via INTRAVENOUS
  Filled 2019-06-21: qty 30

## 2019-06-21 MED ORDER — SODIUM CHLORIDE 0.9 % IV SOLN: Freq: Once | INTRAVENOUS | Status: AC

## 2019-06-21 MED ORDER — DIPHENHYDRAMINE HCL 50 MG/ML IJ SOLN
50.0000 mg | Freq: Once | INTRAMUSCULAR | Status: AC
  Administered 2019-06-21: 50 mg via INTRAVENOUS
  Filled 2019-06-21: qty 1

## 2019-06-21 MED ORDER — ACETAMINOPHEN 325 MG PO TABS
650.0000 mg | ORAL_TABLET | Freq: Once | ORAL | Status: AC
  Administered 2019-06-21: 09:00:00 650 mg via ORAL
  Filled 2019-06-21: qty 2

## 2019-06-21 MED ORDER — SODIUM CHLORIDE 0.9% FLUSH
10.0000 mL | Freq: Once | INTRAVENOUS | Status: AC
  Administered 2019-06-21: 10 mL via INTRAVENOUS

## 2019-06-21 MED ORDER — SODIUM CHLORIDE 0.9 % IV SOLN
Freq: Once | INTRAVENOUS | Status: AC
  Administered 2019-06-21: 8 mg via INTRAVENOUS
  Filled 2019-06-21: qty 4

## 2019-06-21 MED ORDER — PREDNISONE 50 MG PO TABS: 60.0000 mg/m2/d | ORAL_TABLET | Freq: Two times a day (BID) | ORAL | Status: DC

## 2019-06-21 NOTE — Progress Notes (Signed)
[place] [place], [place] [address]   CLINIC:  Medical Oncology/Hematology  PCP:  Loman Brooklyn, [address] [place] [address] [phone]   REASON FOR VISIT:  Follow-up for high-grade lymphoma.  CURRENT THERAPY: Dose adjusted R-EPOCH  BRIEF ONCOLOGIC HISTORY:  Oncology History  Diffuse large B-cell lymphoma of lymph nodes of multiple regions ([place])  04/26/2019 Initial Diagnosis   Diffuse large B-cell lymphoma of lymph nodes of multiple regions ([place])   05/10/2019 -  Chemotherapy   The patient had pegfilgrastim (NEULASTA ONPRO KIT) injection 6 mg, 6 mg, Subcutaneous, Once, 1 of 1 cycle pegfilgrastim-jmdb (FULPHILA) injection 6 mg, 6 mg, Subcutaneous,  Once, 3 of 4 cycles Administration: 6 mg (05/18/2019), 6 mg (06/07/2019) DOXOrubicin (ADRIAMYCIN) 20 mg, etoposide (VEPESID) 104 mg, vinCRIStine (ONCOVIN) 0.8 mg in sodium chloride 0.9 % 500 mL chemo infusion, , Intravenous, Once, 3 of 4 cycles Administration:  (05/11/2019),  (05/31/2019),  (06/01/2019),  (05/12/2019),  (05/13/2019),  (05/14/2019),  (06/02/2019),  (06/03/2019),  (06/20/2019) ondansetron (ZOFRAN) 8 mg in sodium chloride 0.9 % 50 mL IVPB, , Intravenous,  Once, 3 of 4 cycles Administration: 8 mg (05/10/2019), 8 mg (05/11/2019), 16 mg (05/17/2019), 8 mg (06/01/2019), 16 mg (06/04/2019), 8 mg (05/12/2019), 8 mg (05/13/2019), 8 mg (05/14/2019), 8 mg (06/02/2019), 8 mg (06/03/2019), 8 mg (06/20/2019) cyclophosphamide (CYTOXAN) 1,560 mg in sodium chloride 0.9 % 250 mL chemo infusion, 750 mg/m2 = 1,560 mg, Intravenous,  Once, 3 of 4 cycles Administration: 1,560 mg (05/17/2019), 1,560 mg (06/04/2019) riTUXimab-pvvr (RUXIENCE) 800 mg in sodium chloride 0.9 % 250 mL (2.4242 mg/mL) infusion, 375 mg/m2 = 800 mg, Intravenous,  Once, 3 of 4 cycles Dose modification: 100 mg (original dose 375 mg/m2, Cycle 1, Reason: Other (see comments), Comment: test dose), 600 mg (original dose 375 mg/m2, Cycle 1,  Reason: Other (see comments), Comment: due to previous reaction giving test dose first), 400 mg (original dose 375 mg/m2, Cycle 2, Reason: Other (see comments), Comment: remainder of day before fluid - do not bill) Administration: 800 mg (05/10/2019), 800 mg (05/31/2019), 100 mg (05/17/2019), 400 mg (06/01/2019), 800 mg (06/21/2019)  for chemotherapy treatment.    05/11/2019 - 05/11/2019 Chemotherapy   The patient had DOXOrubicin (ADRIAMYCIN) chemo injection 104 mg, 50 mg/m2, Intravenous,  Once, 0 of 6 cycles palonosetron (ALOXI) injection 0.25 mg, 0.25 mg, Intravenous,  Once, 0 of 6 cycles pegfilgrastim-jmdb (FULPHILA) injection 6 mg, 6 mg, Subcutaneous,  Once, 0 of 6 cycles vinCRIStine (ONCOVIN) 2 mg in sodium chloride 0.9 % 50 mL chemo infusion, 2 mg, Intravenous,  Once, 0 of 6 cycles cyclophosphamide (CYTOXAN) 1,540 mg in sodium chloride 0.9 % 250 mL chemo infusion, 750 mg/m2, Intravenous,  Once, 0 of 6 cycles fosaprepitant (EMEND) 150 mg, dexamethasone (DECADRON) 12 mg in sodium chloride 0.9 % 145 mL IVPB, , Intravenous,  Once, 0 of 6 cycles riTUXimab-pvvr (RUXIENCE) 800 mg in sodium chloride 0.9 % 250 mL (2.4242 mg/mL) infusion, 375 mg/m2, Intravenous,  Once, 0 of 6 cycles  for chemotherapy treatment.       CANCER STAGING: Cancer Staging No matching staging information was found for the patient.   INTERVAL HISTORY:  Mr. Zingale 63 y.o. male seen for double hit lymphoma and toxicity evaluation.  Cycle 3 was started on 06/20/2019.  Denies any nausea, vomiting, diarrhea or constipation.  He is accompanied by his daughter.  Appetite and energy levels are 75%.  Minor mouth sores which are stable.  He is using Oceanographer  mouthwash as needed.  Anxiety is well controlled with Ativan.  He is not requiring any pain medication for abdominal pain.  Denies any fevers, night sweats or weight loss.  REVIEW OF SYSTEMS:  Review of Systems  HENT:   Positive for mouth sores.   All other systems reviewed and  are negative.    PAST MEDICAL/SURGICAL HISTORY:  Past Medical History:  Diagnosis Date  . Diffuse large B cell lymphoma ([place]) 04/26/2019  . Hyperlipidemia 02/01/2014  . Hypertension   . Leg DVT (deep venous thromboembolism), acute, bilateral ([place]) 04/18/2019  . Port-A-Cath in place 04/27/2019   Past Surgical History:  Procedure Laterality Date  . CYSTOSCOPY W/ URETERAL STENT PLACEMENT Bilateral 04/21/2019   Procedure: CYSTOSCOPY WITH RETROGRADE PYELOGRAM/URETERAL STENT PLACEMENT;  Surgeon: Cleon Gustin, MD;  Location: [facility];  Service: Urology;  Laterality: Bilateral;  . INGUINAL LYMPH NODE BIOPSY Right 04/21/2019   Procedure: INGUINAL LYMPH NODE BIOPSY;  Surgeon: Virl Cagey, MD;  Location: [facility];  Service: General;  Laterality: Right;  . PORTACATH PLACEMENT Left 05/07/2019   Procedure: INSERTION PORT-A-CATH (catheter left subclavian);  Surgeon: Virl Cagey, MD;  Location: [facility];  Service: General;  Laterality: Left;     SOCIAL HISTORY:  Social History   Socioeconomic History  . Marital status: Married    Spouse name: Not on file  . Number of children: Not on file  . Years of education: Not on file  . Highest education level: Not on file  Occupational History  . Not on file  Tobacco Use  . Smoking status: Former Smoker    Quit date: 04/27/1999    Years since quitting: 20.1  . Smokeless tobacco: Never Used  Substance and Sexual Activity  . Alcohol use: Yes    Alcohol/week: 56.0 standard drinks    Types: 56 Cans of beer per week  . Drug use: Never  . Sexual activity: Not on file  Other Topics Concern  . Not on file  Social History Narrative  . Not on file   Social Determinants of Health   Financial Resource Strain:   . Difficulty of Paying Living Expenses: Not on file  Food Insecurity:   . Worried About Charity fundraiser in the Last Year: Not on file  . Ran Out of Food in the Last Year: Not on file  Transportation Needs:   . Lack of  Transportation (Medical): Not on file  . Lack of Transportation (Non-Medical): Not on file  Physical Activity:   . Days of Exercise per Week: Not on file  . Minutes of Exercise per Session: Not on file  Stress:   . Feeling of Stress : Not on file  Social Connections:   . Frequency of Communication with Friends and Family: Not on file  . Frequency of Social Gatherings with Friends and Family: Not on file  . Attends Religious Services: Not on file  . Active Member of Clubs or Organizations: Not on file  . Attends Archivist Meetings: Not on file  . Marital Status: Not on file  Intimate Partner Violence:   . Fear of Current or Ex-Partner: Not on file  . Emotionally Abused: Not on file  . Physically Abused: Not on file  . Sexually Abused: Not on file    FAMILY HISTORY:  Family History  Problem Relation Age of Onset  . Diabetes Brother   . Diabetes Mother   . Leukemia Brother   . Diabetes Son  CURRENT MEDICATIONS:  Outpatient Encounter Medications as of 06/21/2019  Medication Sig  . allopurinol (ZYLOPRIM) 300 MG tablet Take 1 tablet (300 mg total) by mouth daily.  Marland Kitchen amLODipine (NORVASC) 10 MG tablet Take 1 tablet (10 mg total) by mouth daily.  Marland Kitchen apixaban (ELIQUIS) 5 MG TABS tablet Take 1 tablet (5 mg total) by mouth 2 (two) times daily.  . CYCLOPHOSPHAMIDE IV Inject into the vein every 21 ( twenty-one) days.  Marland Kitchen docusate sodium (COLACE) 100 MG capsule Take 100 mg by mouth 2 (two) times daily.  Marland Kitchen DOXORUBICIN HCL IV Inject into the vein every 21 ( twenty-one) days.  . ETOPOSIDE IV Inject into the vein.  . K Phos Mono-Sod Phos Di & Mono (PHOSPHA 250 NEUTRAL) [id number] MG TABS TAKE 1 TABLET BY MOUTH THREE TIMES A DAY  . LORazepam (ATIVAN) 0.5 MG tablet Take 1 tablet (0.5 mg total) by mouth every 8 (eight) hours.  Marland Kitchen losartan (COZAAR) 100 MG tablet Take 1 tablet (100 mg total) by mouth daily.  . magic mouthwash SOLN Swish 64m for 5 minutes then spit out.  . metoprolol  succinate (TOPROL-XL) 50 MG 24 hr tablet Take 1 tablet (50 mg total) by mouth daily.  . mupirocin ointment (BACTROBAN) 2 % Place 1 application into the nose 2 (two) times daily.  . OXYGEN Inhale 2 L into the lungs 3 (three) times daily as needed (oxygen level below 93).   . predniSONE (DELTASONE) 20 MG tablet Take 3 tablets (60 mg total) by mouth daily. Take on days 1-5 of chemotherapy.  . prochlorperazine (COMPAZINE) 10 MG tablet Take 10 mg by mouth every 6 (six) hours as needed for nausea or vomiting.  .Marland KitchenRITUXIMAB IV Inject into the vein every 21 ( twenty-one) days.  . vinCRIStine 2 mg in sodium chloride 0.9 % 50 mL Inject 2 mg into the vein every 21 ( twenty-one) days.  .Marland KitchenHYDROcodone-acetaminophen (NORCO/VICODIN) 5-325 MG tablet Take 1 tablet by mouth every 8 (eight) hours as needed for moderate pain. (Patient not taking: Reported on 06/21/2019)  . hydrocortisone (CORTEF) 20 MG tablet Take 20 mg by mouth 5 (five) times daily as needed.  . [DISCONTINUED] ciprofloxacin (CIPRO) 500 MG tablet Take 1 tablet (500 mg total) by mouth 2 (two) times daily.   Facility-Administered Encounter Medications as of 06/21/2019  Medication Note  . 0.9 %  sodium chloride infusion   . [COMPLETED] acetaminophen (TYLENOL) tablet 650 mg   . [COMPLETED] diphenhydrAMINE (BENADRYL) injection 50 mg   . [COMPLETED] DOXOrubicin (ADRIAMYCIN) 20 mg, etoposide (VEPESID) 104 mg, vinCRIStine (ONCOVIN) 0.8 mg in sodium chloride 0.9 % 500 mL chemo infusion   . DOXOrubicin (ADRIAMYCIN) 20 mg, etoposide (VEPESID) 104 mg, vinCRIStine (ONCOVIN) 0.8 mg in sodium chloride 0.9 % 500 mL chemo infusion   . [COMPLETED] famotidine (PEPCID) IVPB 20 mg premix   . [COMPLETED] riTUXimab-pvvr (RUXIENCE) 800 mg in sodium chloride 0.9 % 250 mL (2.4242 mg/mL) infusion   . [DISCONTINUED] 0.9 %  sodium chloride infusion   . [DISCONTINUED] heparin lock flush 100 unit/mL   . [DISCONTINUED] ondansetron (ZOFRAN) 8 mg, dexamethasone (DECADRON) 10 mg in  sodium chloride 0.9 % 50 mL IVPB   . [DISCONTINUED] predniSONE (DELTASONE) tablet 62 mg 06/21/2019: took at home  . [DISCONTINUED] sodium chloride flush (NS) 0.9 % injection 10 mL     ALLERGIES:  No Known Allergies   PHYSICAL EXAM:  ECOG Performance status: 1  Vitals:   06/21/19 0827  BP: (!) 136/56  Pulse: 79  Resp: 18  Temp: 98.2 F (36.8 C)  SpO2: 98%   Filed Weights   06/21/19 0827  Weight: 179 lb (81.2 kg)    Physical Exam Vitals reviewed.  Constitutional:      Appearance: Normal appearance.  Cardiovascular:     Rate and Rhythm: Normal rate and regular rhythm.     Heart sounds: Normal heart sounds.  Pulmonary:     Effort: Pulmonary effort is normal.     Breath sounds: Normal breath sounds.  Abdominal:     General: There is no distension.     Palpations: Abdomen is soft. There is no mass.  Musculoskeletal:        General: No swelling.  Skin:    General: Skin is warm.  Neurological:     General: No focal deficit present.     Mental Status: He is alert and oriented to person, place, and time.  Psychiatric:        Mood and Affect: Mood normal.        Behavior: Behavior normal.      LABORATORY DATA:  I have reviewed the labs as listed.  CBC    Component Value Date/Time   WBC 17.6 (H) 06/18/2019 1309   RBC 3.05 (L) 06/18/2019 1309   HGB 8.7 (L) 06/18/2019 1309   HCT 28.6 (L) 06/18/2019 1309   PLT 245 06/18/2019 1309   MCV 93.8 06/18/2019 1309   MCH 28.5 06/18/2019 1309   MCHC 30.4 06/18/2019 1309   RDW 14.4 06/18/2019 1309   LYMPHSABS 1.8 06/18/2019 1309   MONOABS 1.6 (H) 06/18/2019 1309   EOSABS 0.3 06/18/2019 1309   BASOSABS 0.3 (H) 06/18/2019 1309   CMP Latest Ref Rng & Units 06/18/2019 06/15/2019 06/07/2019  Glucose 70 - 99 mg/dL 115(H) 146(H) 108(H)  BUN 8 - 23 mg/dL 20 13 24(H)  Creatinine 0.61 - 1.24 mg/dL 0.71 0.88 0.47(L)  Sodium 135 - 145 mmol/L 141 140 137  Potassium 3.5 - 5.1 mmol/L 4.2 4.5 4.1  Chloride 98 - 111 mmol/L 105 103  98  CO2 22 - 32 mmol/L _0 Calcium 8.9 - 10.3 mg/dL 9.0 8.9 8.9  Total Protein 6.5 - 8.1 g/dL 6.1(L) 5.8(L) 4.8(L)  Total Bilirubin 0.3 - 1.2 mg/dL 0.4 0.3 0.7  Alkaline Phos 38 - 126 U/L 88 98 66  AST 15 - 41 U/L 15 16 13(L)  ALT 0 - 44 U/L _1 DIAGNOSTIC IMAGING:  I have independently reviewed the scans and discussed with the patient.     ASSESSMENT & PLAN:   Diffuse large B-cell lymphoma of lymph nodes of multiple regions ([place]) 1.  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 -PET scan on 05/03/2019 shows bulky intensely hypermetabolic peritoneal and retroperitoneal nodal mass, nodularity.  Additional hypermetabolic adenopathy involving the axilla, mediastinum, iliac and inguinal lymph nodes.  Multiple foci of discrete hypermetabolic metastases in the right iliac bone, left iliac crest, left and right scapula, and the C1 vertebral body. 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 -Cycle 1 of dose adjusted R-EPOCH on 05/11/2019. -Cycle 2 of chemotherapy on 05/31/2019.  He tolerated split dose rituximab about 2 days very well. -He did have labs done last Friday.  We have started him on cycle 3 day 1 on 06/20/2019 due to holiday schedule. -He denies any tingling or  numbness next 20s.  Denies any mucositis.  Denies any nausea vomiting or diarrhea. -I have reviewed his labs.  He will proceed with day 2 cycle 3.  We will plan to give him rituximab as single dose today. -We will reevaluate him in 1 week and repeat labs.  He will have a PET scan done prior to start of cycle 4 in 3 weeks.  This is for evaluation of response.  2.  Bilateral leg DVT: -Ultrasound Doppler on 04/18/2019 + for bilateral leg DVT. -He will continue Eliquis.  3.  Right hydronephrosis: -Cystoscopy and bilateral JJ stent placement on 04/21/2019.  He denies  any hematuria at this time.  4.  Weight loss: -He lost about 25 pounds after cycle 1. -He is continuing to gain weight with each treatment.  5.  Hypophosphatemia: -He will continue Neutra-Phos 3 times a day.  Phosphate has normalized.  6.  Anxiety: -She will use Ativan 0.5 mg twice daily as needed.       Orders placed this encounter:  No orders of the defined types were placed in this encounter.     Derek Jack, MD [place] [phone]

## 2019-06-21 NOTE — Progress Notes (Signed)
To treatment room for chemotherapy and oncology follow up visit.  No complaints voiced.  No s/s of distress noted.  Family at side.    Patient tolerated Rituxan with no complaints voiced.  Chemotherapy pump connected with no alarms noted. Port site clean and dry with no bruising or swelling noted at site.  Good blood return noted before and after administration of therapy.  Dressing intact.  Patient left ambulatory with VSS and no s/s of distress noted.

## 2019-06-21 NOTE — Assessment & Plan Note (Addendum)
1.  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 -PET scan on 05/03/2019 shows bulky intensely hypermetabolic peritoneal and retroperitoneal nodal mass, nodularity.  Additional hypermetabolic adenopathy involving the axilla, mediastinum, iliac and inguinal lymph nodes.  Multiple foci of discrete hypermetabolic metastases in the right iliac bone, left iliac crest, left and right scapula, and the C1 vertebral body. 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 -Cycle 1 of dose adjusted R-EPOCH on 05/11/2019. -Cycle 2 of chemotherapy on 05/31/2019.  He tolerated split dose rituximab about 2 days very well. -He did have labs done last Friday.  We have started him on cycle 3 day 1 on 06/20/2019 due to holiday schedule. -He denies any tingling or numbness next 20s.  Denies any mucositis.  Denies any nausea vomiting or diarrhea. -I have reviewed his labs.  He will proceed with day 2 cycle 3.  We will plan to give him rituximab as single dose today. -We will reevaluate him in 1 week and repeat labs.  He will have a PET scan done prior to start of cycle 4 in 3 weeks.  This is for evaluation of response.  2.  Bilateral leg DVT: -Ultrasound Doppler on 04/18/2019 + for bilateral leg DVT. -He will continue Eliquis.  3.  Right hydronephrosis: -Cystoscopy and bilateral JJ stent placement on 04/21/2019.  He denies any hematuria at this time.  4.  Weight loss: -He lost about 25 pounds after cycle 1. -He is continuing to gain weight with each treatment.  5.  Hypophosphatemia: -He will continue Neutra-Phos 3 times a day.  Phosphate has normalized.  6.  Anxiety: -She will use Ativan 0.5 mg twice daily as needed.

## 2019-06-21 NOTE — Patient Instructions (Addendum)
[facility] at [facility] Discharge Instructions  You were seen today by Dr. Katragadda. He went over your recent lab results. He will see you back as scheduled for labs and follow up.   Thank you for choosing [facility] at [facility] to provide your oncology and hematology care.  To afford each patient quality time with our provider, please arrive at least 15 minutes before your scheduled appointment time.   If you have a lab appointment with the Cancer Center please come in thru the  Main Entrance and check in at the main information desk  You need to re-schedule your appointment should you arrive 10 or more minutes late.  We strive to give you quality time with our providers, and arriving late affects you and other patients whose appointments are after yours.  Also, if you no show three or more times for appointments you may be dismissed from the clinic at the providers discretion.     Again, thank you for choosing [facility].  Our hope is that these requests will decrease the amount of time that you wait before being seen by our physicians.       _____________________________________________________________  Should you have questions after your visit to [facility], please contact our office at [phone] between the hours of 8:00 a.m. and 4:30 p.m.  Voicemails left after 4:00 p.m. will not be returned until the following business day.  For prescription refill requests, have your pharmacy contact our office and allow 72 hours.    Cancer Center Support Programs:   > Cancer Support Group  2nd Tuesday of the month 1pm-2pm, Journey Room    

## 2019-06-22 ENCOUNTER — Ambulatory Visit (HOSPITAL_COMMUNITY): Payer: BLUE CROSS/BLUE SHIELD

## 2019-06-22 ENCOUNTER — Inpatient Hospital Stay (HOSPITAL_COMMUNITY): Payer: BLUE CROSS/BLUE SHIELD

## 2019-06-22 ENCOUNTER — Other Ambulatory Visit: Payer: Self-pay

## 2019-06-22 ENCOUNTER — Ambulatory Visit (HOSPITAL_COMMUNITY)
Admission: RE | Admit: 2019-06-22 | Discharge: 2019-06-22 | Disposition: A | Discharge status: Home / Self Care | Payer: BLUE CROSS/BLUE SHIELD | Source: Ambulatory Visit | Attending: Urology | Admitting: Urology

## 2019-06-22 VITALS — BP 143/54 | HR 73 | Temp 97.5°F | Resp 18

## 2019-06-22 DIAGNOSIS — N135 Crossing vessel and stricture of ureter without hydronephrosis: Secondary | ICD-10-CM | POA: Diagnosis not present

## 2019-06-22 DIAGNOSIS — C8590 Non-Hodgkin lymphoma, unspecified, unspecified site: Secondary | ICD-10-CM | POA: Diagnosis not present

## 2019-06-22 DIAGNOSIS — C8338 Diffuse large B-cell lymphoma, lymph nodes of multiple sites: Secondary | ICD-10-CM

## 2019-06-22 DIAGNOSIS — Z5111 Encounter for antineoplastic chemotherapy: Secondary | ICD-10-CM | POA: Diagnosis not present

## 2019-06-22 DIAGNOSIS — Z5189 Encounter for other specified aftercare: Secondary | ICD-10-CM | POA: Diagnosis not present

## 2019-06-22 DIAGNOSIS — Z5112 Encounter for antineoplastic immunotherapy: Secondary | ICD-10-CM | POA: Diagnosis not present

## 2019-06-22 DIAGNOSIS — N1330 Unspecified hydronephrosis: Secondary | ICD-10-CM | POA: Diagnosis not present

## 2019-06-22 DIAGNOSIS — Z95828 Presence of other vascular implants and grafts: Secondary | ICD-10-CM

## 2019-06-22 MED ORDER — SODIUM CHLORIDE 0.9 % IV SOLN
Freq: Once | INTRAVENOUS | Status: AC
  Administered 2019-06-22: 12:00:00 8 mg via INTRAVENOUS
  Filled 2019-06-22: qty 4

## 2019-06-22 MED ORDER — SODIUM CHLORIDE 0.9 % IV SOLN: INTRAVENOUS | Status: DC

## 2019-06-22 MED ORDER — VINCRISTINE SULFATE CHEMO INJECTION 1 MG/ML
Freq: Once | INTRAVENOUS | Status: AC
  Filled 2019-06-22: qty 10

## 2019-06-22 NOTE — Progress Notes (Signed)
Treatment given per orders. Patient tolerated it well without problems. Vitals stable and discharged home from clinic ambulatory. Follow up as scheduled.  

## 2019-06-23 ENCOUNTER — Inpatient Hospital Stay (HOSPITAL_COMMUNITY): Payer: BLUE CROSS/BLUE SHIELD

## 2019-06-23 ENCOUNTER — Other Ambulatory Visit (HOSPITAL_COMMUNITY): Payer: BLUE CROSS/BLUE SHIELD

## 2019-06-23 VITALS — BP 130/54 | HR 58 | Temp 97.3°F | Resp 18

## 2019-06-23 DIAGNOSIS — Z5189 Encounter for other specified aftercare: Secondary | ICD-10-CM | POA: Diagnosis not present

## 2019-06-23 DIAGNOSIS — C8338 Diffuse large B-cell lymphoma, lymph nodes of multiple sites: Secondary | ICD-10-CM

## 2019-06-23 DIAGNOSIS — Z95828 Presence of other vascular implants and grafts: Secondary | ICD-10-CM

## 2019-06-23 DIAGNOSIS — Z5112 Encounter for antineoplastic immunotherapy: Secondary | ICD-10-CM | POA: Diagnosis not present

## 2019-06-23 DIAGNOSIS — Z5111 Encounter for antineoplastic chemotherapy: Secondary | ICD-10-CM | POA: Diagnosis not present

## 2019-06-23 LAB — COMPREHENSIVE METABOLIC PANEL
ALT: 16 U/L (ref 0–44)
AST: 17 U/L (ref 15–41)
Albumin: 2.9 g/dL — ABNORMAL LOW (ref 3.5–5.0)
Alkaline Phosphatase: 64 U/L (ref 38–126)
Anion gap: 11 (ref 5–15)
BUN: 23 mg/dL (ref 8–23)
CO2: 25 mmol/L (ref 22–32)
Calcium: 8.5 mg/dL — ABNORMAL LOW (ref 8.9–10.3)
Chloride: 106 mmol/L (ref 98–111)
Creatinine, Ser: 0.61 mg/dL (ref 0.61–1.24)
GFR calc Af Amer: >60 mL/min (ref >60)
GFR calc non Af Amer: >60 mL/min (ref >60)
Glucose, Bld: 175 mg/dL — ABNORMAL HIGH (ref 70–99)
Potassium: 3.4 mmol/L — ABNORMAL LOW (ref 3.5–5.1)
Sodium: 142 mmol/L (ref 135–145)
Total Bilirubin: 0.3 mg/dL (ref 0.3–1.2)
Total Protein: 5.2 g/dL — ABNORMAL LOW (ref 6.5–8.1)

## 2019-06-23 LAB — CBC WITH DIFFERENTIAL/PLATELET
Abs Immature Granulocytes: 0.45 10*3/uL — ABNORMAL HIGH (ref 0.00–0.07)
Basophils Absolute: 0 10*3/uL (ref 0.0–0.1)
Basophils Relative: 0 %
Eosinophils Absolute: 0 10*3/uL (ref 0.0–0.5)
Eosinophils Relative: 0 %
HCT: 25.6 % — ABNORMAL LOW (ref 39.0–52.0)
Hemoglobin: 8 g/dL — ABNORMAL LOW (ref 13.0–17.0)
Immature Granulocytes: 2 %
Lymphocytes Relative: 2 %
Lymphs Abs: 0.5 10*3/uL — ABNORMAL LOW (ref 0.7–4.0)
MCH: 28.6 pg (ref 26.0–34.0)
MCHC: 31.3 g/dL (ref 30.0–36.0)
MCV: 91.4 fL (ref 80.0–100.0)
Monocytes Absolute: 0.5 10*3/uL (ref 0.1–1.0)
Monocytes Relative: 3 %
Neutro Abs: 19.7 10*3/uL — ABNORMAL HIGH (ref 1.7–7.7)
Neutrophils Relative %: 93 %
Platelets: 548 10*3/uL — ABNORMAL HIGH (ref 150–400)
RBC: 2.8 MIL/uL — ABNORMAL LOW (ref 4.22–5.81)
RDW: 14 % (ref 11.5–15.5)
WBC: 21.2 10*3/uL — ABNORMAL HIGH (ref 4.0–10.5)
nRBC: 0.1 % (ref 0.0–0.2)

## 2019-06-23 LAB — URIC ACID: Uric Acid, Serum: 3.1 mg/dL — ABNORMAL LOW (ref 3.7–8.6)

## 2019-06-23 LAB — PHOSPHORUS: Phosphorus: 2.3 mg/dL — ABNORMAL LOW (ref 2.5–4.6)

## 2019-06-23 LAB — MAGNESIUM: Magnesium: 1.8 mg/dL (ref 1.7–2.4)

## 2019-06-23 MED ORDER — SODIUM CHLORIDE 0.9 % IV SOLN
Freq: Once | INTRAVENOUS | Status: AC
  Administered 2019-06-23: 11:00:00 8 mg via INTRAVENOUS
  Filled 2019-06-23: qty 4

## 2019-06-23 MED ORDER — SODIUM CHLORIDE 0.9 % IV SOLN: Freq: Once | INTRAVENOUS | Status: AC

## 2019-06-23 MED ORDER — SODIUM CHLORIDE 0.9% FLUSH
10.0000 mL | Freq: Once | INTRAVENOUS | Status: AC
  Administered 2019-06-23: 10 mL via INTRAVENOUS

## 2019-06-23 MED ORDER — VINCRISTINE SULFATE CHEMO INJECTION 1 MG/ML
Freq: Once | INTRAVENOUS | Status: AC
  Filled 2019-06-23: qty 10

## 2019-06-23 NOTE — Progress Notes (Unsigned)
To treatment room for pump exchange and nausea medications.  No complaints voiced.  Port site clean and dry with no bruising or swelling noted.  Good blood return noted.  No complaints with pump or port site.  No s/s of distress noted.    Chemo pump reconnected with no complaints voiced.  No alarms noted with pump.  Port site clean and dry with good blood return noted before restarting chemotherapy.  Dressing intact.  Vital signs stable with no s/s of distress noted.

## 2019-06-24 ENCOUNTER — Other Ambulatory Visit: Payer: Self-pay

## 2019-06-24 ENCOUNTER — Encounter (HOSPITAL_COMMUNITY): Payer: Self-pay

## 2019-06-24 ENCOUNTER — Inpatient Hospital Stay (HOSPITAL_COMMUNITY): Payer: BLUE CROSS/BLUE SHIELD

## 2019-06-24 VITALS — BP 132/52 | HR 56 | Temp 96.9°F | Resp 18

## 2019-06-24 DIAGNOSIS — Z5112 Encounter for antineoplastic immunotherapy: Secondary | ICD-10-CM | POA: Diagnosis not present

## 2019-06-24 DIAGNOSIS — Z95828 Presence of other vascular implants and grafts: Secondary | ICD-10-CM

## 2019-06-24 DIAGNOSIS — Z5189 Encounter for other specified aftercare: Secondary | ICD-10-CM | POA: Diagnosis not present

## 2019-06-24 DIAGNOSIS — C8338 Diffuse large B-cell lymphoma, lymph nodes of multiple sites: Secondary | ICD-10-CM | POA: Diagnosis not present

## 2019-06-24 DIAGNOSIS — Z5111 Encounter for antineoplastic chemotherapy: Secondary | ICD-10-CM | POA: Diagnosis not present

## 2019-06-24 MED ORDER — SODIUM CHLORIDE 0.9 % IV SOLN
750.0000 mg/m2 | Freq: Once | INTRAVENOUS | Status: AC
  Administered 2019-06-24: 1560 mg via INTRAVENOUS
  Filled 2019-06-24: qty 78

## 2019-06-24 MED ORDER — HEPARIN SOD (PORK) LOCK FLUSH 100 UNIT/ML IV SOLN
500.0000 [IU] | Freq: Once | INTRAVENOUS | Status: AC
  Administered 2019-06-24: 500 [IU] via INTRAVENOUS

## 2019-06-24 MED ORDER — SODIUM CHLORIDE 0.9 % IV SOLN: Freq: Once | INTRAVENOUS | Status: AC

## 2019-06-24 MED ORDER — SODIUM CHLORIDE 0.9% FLUSH
10.0000 mL | Freq: Once | INTRAVENOUS | Status: AC
  Administered 2019-06-24: 12:00:00 10 mL via INTRAVENOUS

## 2019-06-24 MED ORDER — SODIUM CHLORIDE 0.9 % IV SOLN
Freq: Once | INTRAVENOUS | Status: AC
  Administered 2019-06-24: 16 mg via INTRAVENOUS
  Filled 2019-06-24: qty 8

## 2019-06-24 NOTE — Progress Notes (Signed)
To treatment room for chemotherapy.  No complaints voiced.  Port site clean and dry with good blood return noted before infusion.  Dressing intact.  No s/s of distress noted.    Patient tolerated chemotherapy with no complaints voiced.  Port site clean and dry with no bruising or swelling noted at site.  Good blood return noted before and after administration of chemotherapy.  Band aid applied.  Patient left ambulatory with VSS and no s/s of distress noted.

## 2019-06-26 ENCOUNTER — Other Ambulatory Visit (HOSPITAL_COMMUNITY): Payer: Self-pay | Admitting: Hematology

## 2019-06-26 ENCOUNTER — Encounter (HOSPITAL_COMMUNITY): Payer: Self-pay

## 2019-06-26 ENCOUNTER — Inpatient Hospital Stay (HOSPITAL_COMMUNITY): Payer: BLUE CROSS/BLUE SHIELD

## 2019-06-26 ENCOUNTER — Other Ambulatory Visit: Payer: Self-pay

## 2019-06-26 ENCOUNTER — Encounter (HOSPITAL_COMMUNITY): Payer: Self-pay | Admitting: Hematology

## 2019-06-26 VITALS — BP 133/56 | HR 66 | Temp 97.1°F | Resp 18

## 2019-06-26 DIAGNOSIS — Z95828 Presence of other vascular implants and grafts: Secondary | ICD-10-CM

## 2019-06-26 DIAGNOSIS — C8338 Diffuse large B-cell lymphoma, lymph nodes of multiple sites: Secondary | ICD-10-CM | POA: Diagnosis not present

## 2019-06-26 DIAGNOSIS — Z5189 Encounter for other specified aftercare: Secondary | ICD-10-CM | POA: Diagnosis not present

## 2019-06-26 DIAGNOSIS — Z5111 Encounter for antineoplastic chemotherapy: Secondary | ICD-10-CM | POA: Diagnosis not present

## 2019-06-26 DIAGNOSIS — Z5112 Encounter for antineoplastic immunotherapy: Secondary | ICD-10-CM | POA: Diagnosis not present

## 2019-06-26 LAB — URINALYSIS, ROUTINE W REFLEX MICROSCOPIC
Bacteria, UA: NONE SEEN
Bilirubin Urine: NEGATIVE
Glucose, UA: NEGATIVE mg/dL
Ketones, ur: NEGATIVE mg/dL
Nitrite: NEGATIVE
Protein, ur: 100 mg/dL — AB
RBC / HPF: >50 RBC/hpf — ABNORMAL HIGH (ref 0–5)
Specific Gravity, Urine: 1.011 (ref 1.005–1.030)
pH: 6 (ref 5.0–8.0)

## 2019-06-26 MED ORDER — CIPROFLOXACIN HCL 500 MG PO TABS: 500.0000 mg | ORAL_TABLET | Freq: Two times a day (BID) | ORAL | 0 refills | Status: DC

## 2019-06-26 MED ORDER — PEGFILGRASTIM-JMDB 6 MG/0.6ML ~~LOC~~ SOSY
6.0000 mg | PREFILLED_SYRINGE | Freq: Once | SUBCUTANEOUS | Status: AC
  Administered 2019-06-26: 6 mg via SUBCUTANEOUS
  Filled 2019-06-26: qty 0.6

## 2019-06-26 NOTE — Progress Notes (Signed)
Patient presents today for Fulphila injection.  Vital signs stable. MAR reviewed and updated. Upon assessment patient complains of burning with urination. Urine and culture ordered and sent to the lab. MD notified by Barry Brunner RN. Patient uses [facility] in [place]. Patient instructed to increase fluids and if symptoms get worse or if patient starts running a fever to go to the ER. Understanding verbalized.    Discharged from clinic ambulatory. F/U with [facility] as scheduled.

## 2019-06-26 NOTE — Patient Instructions (Signed)
[facility] at [facility]  Discharge Instructions:   _______________________________________________________________  Thank you for choosing [facility] at [facility] to provide your oncology and hematology care.  To afford each patient quality time with our providers, please arrive at least 15 minutes before your scheduled appointment.  You need to re-schedule your appointment if you arrive 10 or more minutes late.  We strive to give you quality time with our providers, and arriving late affects you and other patients whose appointments are after yours.  Also, if you no show three or more times for appointments you may be dismissed from the clinic.  Again, thank you for choosing [facility] at [facility]. Our hope is that these requests will allow you access to exceptional care and in a timely manner. _______________________________________________________________  If you have questions after your visit, please contact our office at [phone] between the hours of 8:30 a.m. and 5:00 p.m. Voicemails left after 4:30 p.m. will not be returned until the following business day. _______________________________________________________________  For prescription refill requests, have your pharmacy contact our office. _______________________________________________________________  Recommendations made by the consultant and any test results will be sent to your referring physician. _______________________________________________________________ 

## 2019-06-27 LAB — URINE CULTURE: Culture: NO GROWTH

## 2019-07-07 DIAGNOSIS — J9601 Acute respiratory failure with hypoxia: Secondary | ICD-10-CM | POA: Diagnosis not present

## 2019-07-07 DIAGNOSIS — I272 Pulmonary hypertension, unspecified: Secondary | ICD-10-CM | POA: Diagnosis not present

## 2019-07-10 DIAGNOSIS — C8338 Diffuse large B-cell lymphoma, lymph nodes of multiple sites: Secondary | ICD-10-CM | POA: Diagnosis not present

## 2019-07-12 ENCOUNTER — Other Ambulatory Visit (HOSPITAL_COMMUNITY): Payer: Self-pay | Admitting: *Deleted

## 2019-07-12 ENCOUNTER — Inpatient Hospital Stay (HOSPITAL_COMMUNITY): Payer: BLUE CROSS/BLUE SHIELD | Attending: Hematology | Admitting: Hematology

## 2019-07-12 ENCOUNTER — Encounter (HOSPITAL_COMMUNITY): Payer: Self-pay | Admitting: Hematology

## 2019-07-12 ENCOUNTER — Inpatient Hospital Stay (HOSPITAL_COMMUNITY): Payer: BLUE CROSS/BLUE SHIELD

## 2019-07-12 ENCOUNTER — Other Ambulatory Visit: Payer: Self-pay

## 2019-07-12 VITALS — BP 146/52 | HR 77 | Temp 98.7°F | Resp 18 | Wt 183.4 lb

## 2019-07-12 VITALS — BP 119/50 | HR 67 | Temp 97.0°F | Resp 18

## 2019-07-12 DIAGNOSIS — D649 Anemia, unspecified: Secondary | ICD-10-CM | POA: Diagnosis not present

## 2019-07-12 DIAGNOSIS — Z5189 Encounter for other specified aftercare: Secondary | ICD-10-CM | POA: Insufficient documentation

## 2019-07-12 DIAGNOSIS — C8338 Diffuse large B-cell lymphoma, lymph nodes of multiple sites: Secondary | ICD-10-CM | POA: Insufficient documentation

## 2019-07-12 DIAGNOSIS — Z5112 Encounter for antineoplastic immunotherapy: Secondary | ICD-10-CM | POA: Insufficient documentation

## 2019-07-12 DIAGNOSIS — Z5111 Encounter for antineoplastic chemotherapy: Secondary | ICD-10-CM | POA: Diagnosis not present

## 2019-07-12 DIAGNOSIS — Z95828 Presence of other vascular implants and grafts: Secondary | ICD-10-CM

## 2019-07-12 LAB — CBC WITH DIFFERENTIAL/PLATELET
Abs Immature Granulocytes: 1.21 10*3/uL — ABNORMAL HIGH (ref 0.00–0.07)
Basophils Absolute: 0.1 10*3/uL (ref 0.0–0.1)
Basophils Relative: 1 %
Eosinophils Absolute: 0.2 10*3/uL (ref 0.0–0.5)
Eosinophils Relative: 1 %
HCT: 23.6 % — ABNORMAL LOW (ref 39.0–52.0)
Hemoglobin: 7 g/dL — ABNORMAL LOW (ref 13.0–17.0)
Immature Granulocytes: 7 %
Lymphocytes Relative: 4 %
Lymphs Abs: 0.8 10*3/uL (ref 0.7–4.0)
MCH: 26.9 pg (ref 26.0–34.0)
MCHC: 29.7 g/dL — ABNORMAL LOW (ref 30.0–36.0)
MCV: 90.8 fL (ref 80.0–100.0)
Monocytes Absolute: 1 10*3/uL (ref 0.1–1.0)
Monocytes Relative: 5 %
Neutro Abs: 15.4 10*3/uL — ABNORMAL HIGH (ref 1.7–7.7)
Neutrophils Relative %: 82 %
Platelets: 511 10*3/uL — ABNORMAL HIGH (ref 150–400)
RBC: 2.6 MIL/uL — ABNORMAL LOW (ref 4.22–5.81)
RDW: 16 % — ABNORMAL HIGH (ref 11.5–15.5)
WBC: 18.7 10*3/uL — ABNORMAL HIGH (ref 4.0–10.5)
nRBC: 0.3 % — ABNORMAL HIGH (ref 0.0–0.2)

## 2019-07-12 LAB — COMPREHENSIVE METABOLIC PANEL
ALT: 17 U/L (ref 0–44)
AST: 16 U/L (ref 15–41)
Albumin: 2.9 g/dL — ABNORMAL LOW (ref 3.5–5.0)
Alkaline Phosphatase: 63 U/L (ref 38–126)
Anion gap: 9 (ref 5–15)
BUN: 13 mg/dL (ref 8–23)
CO2: 28 mmol/L (ref 22–32)
Calcium: 9.1 mg/dL (ref 8.9–10.3)
Chloride: 104 mmol/L (ref 98–111)
Creatinine, Ser: 0.61 mg/dL (ref 0.61–1.24)
GFR calc Af Amer: >60 mL/min (ref >60)
GFR calc non Af Amer: >60 mL/min (ref >60)
Glucose, Bld: 116 mg/dL — ABNORMAL HIGH (ref 70–99)
Potassium: 3.9 mmol/L (ref 3.5–5.1)
Sodium: 141 mmol/L (ref 135–145)
Total Bilirubin: 0.3 mg/dL (ref 0.3–1.2)
Total Protein: 5.9 g/dL — ABNORMAL LOW (ref 6.5–8.1)

## 2019-07-12 LAB — URIC ACID: Uric Acid, Serum: 3.1 mg/dL — ABNORMAL LOW (ref 3.7–8.6)

## 2019-07-12 LAB — LACTATE DEHYDROGENASE: LDH: 204 U/L — ABNORMAL HIGH (ref 98–192)

## 2019-07-12 LAB — MAGNESIUM: Magnesium: 1.9 mg/dL (ref 1.7–2.4)

## 2019-07-12 LAB — PHOSPHORUS: Phosphorus: 4 mg/dL (ref 2.5–4.6)

## 2019-07-12 LAB — PREPARE RBC (CROSSMATCH)

## 2019-07-12 LAB — ABO/RH: ABO/RH(D): B POS

## 2019-07-12 MED ORDER — PREDNISONE 50 MG PO TABS: 60.0000 mg/m2/d | ORAL_TABLET | Freq: Two times a day (BID) | ORAL | Status: DC

## 2019-07-12 MED ORDER — FAMOTIDINE IN NACL 20-0.9 MG/50ML-% IV SOLN
20.0000 mg | Freq: Once | INTRAVENOUS | Status: AC
  Administered 2019-07-12: 20 mg via INTRAVENOUS
  Filled 2019-07-12: qty 50

## 2019-07-12 MED ORDER — SODIUM CHLORIDE 0.9 % IV SOLN
Freq: Once | INTRAVENOUS | Status: AC
  Administered 2019-07-12: 8 mg via INTRAVENOUS
  Filled 2019-07-12: qty 4

## 2019-07-12 MED ORDER — SODIUM CHLORIDE 0.9 % IV SOLN
375.0000 mg/m2 | Freq: Once | INTRAVENOUS | Status: AC
  Administered 2019-07-12: 800 mg via INTRAVENOUS
  Filled 2019-07-12: qty 30

## 2019-07-12 MED ORDER — VINCRISTINE SULFATE CHEMO INJECTION 1 MG/ML
Freq: Once | INTRAVENOUS | Status: AC
  Filled 2019-07-12: qty 10

## 2019-07-12 MED ORDER — DIPHENHYDRAMINE HCL 50 MG/ML IJ SOLN
50.0000 mg | Freq: Once | INTRAMUSCULAR | Status: AC
  Administered 2019-07-12: 50 mg via INTRAVENOUS
  Filled 2019-07-12: qty 1

## 2019-07-12 MED ORDER — ACETAMINOPHEN 325 MG PO TABS
650.0000 mg | ORAL_TABLET | Freq: Once | ORAL | Status: AC
  Administered 2019-07-12: 650 mg via ORAL
  Filled 2019-07-12: qty 2

## 2019-07-12 MED ORDER — SODIUM CHLORIDE 0.9% FLUSH
10.0000 mL | INTRAVENOUS | Status: DC | PRN
  Administered 2019-07-12: 10 mL

## 2019-07-12 MED ORDER — SODIUM CHLORIDE 0.9 % IV SOLN: INTRAVENOUS | Status: DC

## 2019-07-12 NOTE — Patient Instructions (Addendum)
[place] at [facility] Discharge Instructions  You were seen today by Dr. Delton Coombes. He went over your recent lab results. He will give you a blood transfusion today as well as treatment. He will schedule you for a repeat PET scan prior to your next visit. He will see you back in  for labs and follow up.   Thank you for choosing [place] at [facility] to provide your oncology and hematology care.  To afford each patient quality time with our provider, please arrive at least 15 minutes before your scheduled appointment time.   If you have a lab appointment with the [facility] please come in thru the  Main Entrance and check in at the main information desk  You need to re-schedule your appointment should you arrive 10 or more minutes late.  We strive to give you quality time with our providers, and arriving late affects you and other patients whose appointments are after yours.  Also, if you no show three or more times for appointments you may be dismissed from the clinic at the providers discretion.     Again, thank you for choosing [facility].  Our hope is that these requests will decrease the amount of time that you wait before being seen by our physicians.       _____________________________________________________________  Should you have questions after your visit to [facility], please contact our office at [phone] between the hours of 8:00 a.m. and 4:30 p.m.  Voicemails left after 4:00 p.m. will not be returned until the following business day.  For prescription refill requests, have your pharmacy contact our office and allow 72 hours.    Cancer Center Support Programs:   > Cancer Support Group  2nd Tuesday of the month 1pm-2pm, Journey Room

## 2019-07-12 NOTE — Progress Notes (Signed)
No complaints voiced with SOB or weakness or fatigue.  No s/s of distress noted.  Family at side.    Patient took steroid from home with breakfast.  Pharmacy notified.   Hgb 7.0 today for oncology follow up visit and treatment.    Ok to treat patient today verbal order Dr. Delton Coombes and to give the patient one unit of blood tomorrow for hemoglobin.  Blood bank notified.    Patient tolerated rituxan with no complaints voiced.  Port site clean and dry with no bruising or swelling noted at site.  Good blood return noted before and after administration of therapy.  Chemo pump connected with no alarms noted.  Dressing intact.   Patient left ambulatory with VSS and no s/s of distress noted.

## 2019-07-12 NOTE — Assessment & Plan Note (Signed)
1.  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 -PET scan on 05/03/2019 shows bulky intensely hypermetabolic peritoneal and retroperitoneal nodal mass, nodularity.  Additional hypermetabolic adenopathy involving the axilla, mediastinum, iliac and inguinal lymph nodes.  Multiple foci of discrete hypermetabolic metastases in the right iliac bone, left iliac crest, left and right scapula, and the C1 vertebral body. 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 -3 cycles of R-EPOCH from 05/11/2019 through 06/20/2019. -He tolerated last cycle very well.  He did not have any reactions to full dose rituximab. -His PET scan was not scheduled after cycle 3.  We will proceed with cycle 4 today.  I will schedule him with a PET scan next available spot. -I have reviewed his recent CT scan which showed decrease in size of abdominal adenopathy. -He was instructed to take prednisone 60 mg for 5 days. -We reviewed his labs.  Hemoglobin is 7.  I have recommended 1 unit of PRBC.  2.  Bilateral leg DVT: -Ultrasound Doppler on 04/18/2019 shows positive bilateral leg DVT. -He will continue Eliquis.  3.  Right hydronephrosis: -Cystoscopy and bilateral JJ stent placement on 04/21/2019. -He does have occasional hematuria.  4.  Weight loss: -He lost 25 pounds after cycle 1. -He is slowly gaining back.  He gained 4 pounds since last visit.  5.  Hypophosphatemia: -His phosphate is normal today.  He will continue Neutra-Phos 3 times a day.  6.  Anxiety: -He is taking Ativan 0.5 mg 1-2 times a day.  7.  TLS prophylaxis: -He will continue allopurinol 300 mg daily.  8.  Hypertension: -He will continue losartan 100 mg daily.  He is also on Toprol-XL 50 mg daily and Norvasc 10 mg daily.

## 2019-07-12 NOTE — Progress Notes (Signed)
[place], [place] [address]   CLINIC:  Medical Oncology/Hematology  PCP:  Loman Brooklyn, [address] [place] [address] [phone]   REASON FOR VISIT:  Follow-up for high-grade lymphoma.  CURRENT THERAPY: Dose adjusted R-EPOCH  BRIEF ONCOLOGIC HISTORY:  Oncology History  Diffuse large B-cell lymphoma of lymph nodes of multiple regions ([place])  04/26/2019 Initial Diagnosis   Diffuse large B-cell lymphoma of lymph nodes of multiple regions ([place])   05/10/2019 -  Chemotherapy   The patient had pegfilgrastim (NEULASTA ONPRO KIT) injection 6 mg, 6 mg, Subcutaneous, Once, 1 of 1 cycle pegfilgrastim-jmdb (FULPHILA) injection 6 mg, 6 mg, Subcutaneous,  Once, 4 of 4 cycles Administration: 6 mg (05/18/2019), 6 mg (06/07/2019), 6 mg (06/26/2019) DOXOrubicin (ADRIAMYCIN) 20 mg, etoposide (VEPESID) 104 mg, vinCRIStine (ONCOVIN) 0.8 mg in sodium chloride 0.9 % 500 mL chemo infusion, , Intravenous, Once, 4 of 4 cycles Administration:  (05/11/2019),  (05/31/2019),  (06/01/2019),  (05/12/2019),  (05/13/2019),  (05/14/2019),  (06/02/2019),  (06/03/2019),  (06/20/2019),  (06/21/2019),  (06/22/2019),  (06/23/2019) ondansetron (ZOFRAN) 8 mg in sodium chloride 0.9 % 50 mL IVPB, , Intravenous,  Once, 4 of 4 cycles Administration: 8 mg (05/10/2019), 8 mg (05/11/2019), 16 mg (05/17/2019), 8 mg (06/01/2019), 16 mg (06/04/2019), 8 mg (05/12/2019), 8 mg (05/13/2019), 8 mg (05/14/2019), 8 mg (06/02/2019), 8 mg (06/03/2019), 8 mg (06/20/2019), 16 mg (06/24/2019), 8 mg (06/22/2019), 8 mg (06/23/2019) cyclophosphamide (CYTOXAN) 1,560 mg in sodium chloride 0.9 % 250 mL chemo infusion, 750 mg/m2 = 1,560 mg, Intravenous,  Once, 4 of 4 cycles Administration: 1,560 mg (05/17/2019), 1,560 mg (06/04/2019), 1,560 mg (06/24/2019) riTUXimab-pvvr (RUXIENCE) 800 mg in sodium chloride 0.9 % 250 mL (2.4242 mg/mL) infusion, 375 mg/m2 = 800 mg, Intravenous,  Once, 4 of 4 cycles Dose  modification: 100 mg (original dose 375 mg/m2, Cycle 1, Reason: Other (see comments), Comment: test dose), 600 mg (original dose 375 mg/m2, Cycle 1, Reason: Other (see comments), Comment: due to previous reaction giving test dose first), 400 mg (original dose 375 mg/m2, Cycle 2, Reason: Other (see comments), Comment: remainder of day before fluid - do not bill) Administration: 800 mg (05/10/2019), 800 mg (05/31/2019), 100 mg (05/17/2019), 400 mg (06/01/2019), 800 mg (06/21/2019)  for chemotherapy treatment.    05/11/2019 - 05/11/2019 Chemotherapy   The patient had DOXOrubicin (ADRIAMYCIN) chemo injection 104 mg, 50 mg/m2, Intravenous,  Once, 0 of 6 cycles palonosetron (ALOXI) injection 0.25 mg, 0.25 mg, Intravenous,  Once, 0 of 6 cycles pegfilgrastim-jmdb (FULPHILA) injection 6 mg, 6 mg, Subcutaneous,  Once, 0 of 6 cycles vinCRIStine (ONCOVIN) 2 mg in sodium chloride 0.9 % 50 mL chemo infusion, 2 mg, Intravenous,  Once, 0 of 6 cycles cyclophosphamide (CYTOXAN) 1,540 mg in sodium chloride 0.9 % 250 mL chemo infusion, 750 mg/m2, Intravenous,  Once, 0 of 6 cycles fosaprepitant (EMEND) 150 mg, dexamethasone (DECADRON) 12 mg in sodium chloride 0.9 % 145 mL IVPB, , Intravenous,  Once, 0 of 6 cycles riTUXimab-pvvr (RUXIENCE) 800 mg in sodium chloride 0.9 % 250 mL (2.4242 mg/mL) infusion, 375 mg/m2, Intravenous,  Once, 0 of 6 cycles  for chemotherapy treatment.       CANCER STAGING: Cancer Staging No matching staging information was found for the patient.   INTERVAL HISTORY:  Kevin [name] 64 y.o. male seen for follow-up of double hit lymphoma nontoxic evaluation prior to cycle 4 of chemotherapy.  After cycle 3, he did not experience any major side effects including  nausea vomiting or abdominal pain.  He did not have any reactions to rituximab last time.  Appetite and energy levels are 75%.  Denies any fevers or night sweats.  He had an episode of UTI for which she was treated with Cipro.  REVIEW OF  SYSTEMS:  Review of Systems  All other systems reviewed and are negative.    PAST MEDICAL/SURGICAL HISTORY:  Past Medical History:  Diagnosis Date  . Diffuse large B cell lymphoma ([place]) 04/26/2019  . Hyperlipidemia 02/01/2014  . Hypertension   . Leg DVT (deep venous thromboembolism), acute, bilateral ([place]) 04/18/2019  . Port-A-Cath in place 04/27/2019   Past Surgical History:  Procedure Laterality Date  . CYSTOSCOPY W/ URETERAL STENT PLACEMENT Bilateral 04/21/2019   Procedure: CYSTOSCOPY WITH RETROGRADE PYELOGRAM/URETERAL STENT PLACEMENT;  Surgeon: Cleon Gustin, MD;  Location: [facility];  Service: Urology;  Laterality: Bilateral;  . INGUINAL LYMPH NODE BIOPSY Right 04/21/2019   Procedure: INGUINAL LYMPH NODE BIOPSY;  Surgeon: Virl Cagey, MD;  Location: [facility];  Service: General;  Laterality: Right;  . PORTACATH PLACEMENT Left 05/07/2019   Procedure: INSERTION PORT-A-CATH (catheter left subclavian);  Surgeon: Virl Cagey, MD;  Location: [facility];  Service: General;  Laterality: Left;     SOCIAL HISTORY:  Social History   Socioeconomic History  . Marital status: Married    Spouse name: Not on file  . Number of children: Not on file  . Years of education: Not on file  . Highest education level: Not on file  Occupational History  . Not on file  Tobacco Use  . Smoking status: Former Smoker    Quit date: 04/27/1999    Years since quitting: 20.2  . Smokeless tobacco: Never Used  Substance and Sexual Activity  . Alcohol use: Yes    Alcohol/week: 56.0 standard drinks    Types: 56 Cans of beer per week  . Drug use: Never  . Sexual activity: Not on file  Other Topics Concern  . Not on file  Social History Narrative  . Not on file   Social Determinants of Health   Financial Resource Strain:   . Difficulty of Paying Living Expenses: Not on file  Food Insecurity:   . Worried About Charity fundraiser in the Last Year: Not on file  . Ran Out of Food in the  Last Year: Not on file  Transportation Needs:   . Lack of Transportation (Medical): Not on file  . Lack of Transportation (Non-Medical): Not on file  Physical Activity:   . Days of Exercise per Week: Not on file  . Minutes of Exercise per Session: Not on file  Stress:   . Feeling of Stress : Not on file  Social Connections:   . Frequency of Communication with Friends and Family: Not on file  . Frequency of Social Gatherings with Friends and Family: Not on file  . Attends Religious Services: Not on file  . Active Member of Clubs or Organizations: Not on file  . Attends Archivist Meetings: Not on file  . Marital Status: Not on file  Intimate Partner Violence:   . Fear of Current or Ex-Partner: Not on file  . Emotionally Abused: Not on file  . Physically Abused: Not on file  . Sexually Abused: Not on file    FAMILY HISTORY:  Family History  Problem Relation Age of Onset  . Diabetes Brother   . Diabetes Mother   . Leukemia Brother   .  Diabetes Son     CURRENT MEDICATIONS:  Outpatient Encounter Medications as of 07/12/2019  Medication Sig  . allopurinol (ZYLOPRIM) 300 MG tablet TAKE 1 TABLET BY MOUTH EVERY DAY  . amLODipine (NORVASC) 10 MG tablet Take 1 tablet (10 mg total) by mouth daily.  Marland Kitchen apixaban (ELIQUIS) 5 MG TABS tablet Take 1 tablet (5 mg total) by mouth 2 (two) times daily.  . CYCLOPHOSPHAMIDE IV Inject into the vein every 21 ( twenty-one) days.  Marland Kitchen docusate sodium (COLACE) 100 MG capsule Take 100 mg by mouth 2 (two) times daily.  Marland Kitchen DOXORUBICIN HCL IV Inject into the vein every 21 ( twenty-one) days.  . ETOPOSIDE IV Inject into the vein.  . K Phos Mono-Sod Phos Di & Mono (PHOSPHA 250 NEUTRAL) 155-852-130 MG TABS TAKE 1 TABLET BY MOUTH THREE TIMES A DAY  . LORazepam (ATIVAN) 0.5 MG tablet Take 1 tablet (0.5 mg total) by mouth every 8 (eight) hours.  Marland Kitchen losartan (COZAAR) 100 MG tablet Take 1 tablet (100 mg total) by mouth daily.  . magic mouthwash SOLN Swish  31m for 5 minutes then spit out.  . metoprolol succinate (TOPROL-XL) 50 MG 24 hr tablet Take 1 tablet (50 mg total) by mouth daily.  . mupirocin ointment (BACTROBAN) 2 % Place 1 application into the nose 2 (two) times daily.  . OXYGEN Inhale 2 L into the lungs 3 (three) times daily as needed (oxygen level below 93).   . predniSONE (DELTASONE) 20 MG tablet TAKE 3 TABLETS (60 MG TOTAL) BY MOUTH DAILY. TAKE ON DAYS 1-5 OF CHEMOTHERAPY.  .Marland KitchenRITUXIMAB IV Inject into the vein every 21 ( twenty-one) days.  . vinCRIStine 2 mg in sodium chloride 0.9 % 50 mL Inject 2 mg into the vein every 21 ( twenty-one) days.  .Marland KitchenHYDROcodone-acetaminophen (NORCO/VICODIN) 5-325 MG tablet Take 1 tablet by mouth every 8 (eight) hours as needed for moderate pain. (Patient not taking: Reported on 07/12/2019)  . hydrocortisone (CORTEF) 20 MG tablet Take 20 mg by mouth 5 (five) times daily as needed.  . prochlorperazine (COMPAZINE) 10 MG tablet Take 10 mg by mouth every 6 (six) hours as needed for nausea or vomiting.  . [DISCONTINUED] ciprofloxacin (CIPRO) 500 MG tablet Take 1 tablet (500 mg total) by mouth 2 (two) times daily.   Facility-Administered Encounter Medications as of 07/12/2019  Medication  . 0.9 %  sodium chloride infusion    ALLERGIES:  No Known Allergies   PHYSICAL EXAM:  ECOG Performance status: 1  Vitals:   07/12/19 0825  BP: (!) 146/52  Pulse: 77  Resp: 18  Temp: 98.7 F (37.1 C)  SpO2: 95%   Filed Weights   07/12/19 0825  Weight: 183 lb 6.4 oz (83.2 kg)    Physical Exam Vitals reviewed.  Constitutional:      Appearance: Normal appearance.  Cardiovascular:     Rate and Rhythm: Normal rate and regular rhythm.     Heart sounds: Normal heart sounds.  Pulmonary:     Effort: Pulmonary effort is normal.     Breath sounds: Normal breath sounds.  Abdominal:     General: There is no distension.     Palpations: Abdomen is soft. There is no mass.  Musculoskeletal:        General: No swelling.   Skin:    General: Skin is warm.  Neurological:     General: No focal deficit present.     Mental Status: He is alert and oriented to person, place,  and time.  Psychiatric:        Mood and Affect: Mood normal.        Behavior: Behavior normal.      LABORATORY DATA:  I have reviewed the labs as listed.  CBC    Component Value Date/Time   WBC 18.7 (H) 07/12/2019 0840   RBC 2.60 (L) 07/12/2019 0840   HGB 7.0 (L) 07/12/2019 0840   HCT 23.6 (L) 07/12/2019 0840   PLT 511 (H) 07/12/2019 0840   MCV 90.8 07/12/2019 0840   MCH 26.9 07/12/2019 0840   MCHC 29.7 (L) 07/12/2019 0840   RDW 16.0 (H) 07/12/2019 0840   LYMPHSABS 0.8 07/12/2019 0840   MONOABS 1.0 07/12/2019 0840   EOSABS 0.2 07/12/2019 0840   BASOSABS 0.1 07/12/2019 0840   CMP Latest Ref Rng & Units 07/12/2019 06/23/2019 06/18/2019  Glucose 70 - 99 mg/dL 116(H) 175(H) 115(H)  BUN 8 - 23 mg/dL '13 23 20  '$ Creatinine 0.61 - 1.24 mg/dL 0.61 0.61 0.71  Sodium 135 - 145 mmol/L 141 142 141  Potassium 3.5 - 5.1 mmol/L 3.9 3.4(L) 4.2  Chloride 98 - 111 mmol/L 104 106 105  CO2 22 - 32 mmol/L '28 25 30  '$ Calcium 8.9 - 10.3 mg/dL 9.1 8.5(L) 9.0  Total Protein 6.5 - 8.1 g/dL 5.9(L) 5.2(L) 6.1(L)  Total Bilirubin 0.3 - 1.2 mg/dL 0.3 0.3 0.4  Alkaline Phos 38 - 126 U/L 63 64 88  AST 15 - 41 U/L '16 17 15  '$ ALT 0 - 44 U/L '17 16 13       '$ DIAGNOSTIC IMAGING:  I have independently reviewed the scans and discussed with the patient.     ASSESSMENT & PLAN:   Diffuse large B-cell lymphoma of lymph nodes of multiple regions ([place]) 1.  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 -PET scan on 05/03/2019 shows bulky intensely hypermetabolic peritoneal and retroperitoneal nodal mass, nodularity.  Additional hypermetabolic adenopathy involving the axilla, mediastinum, iliac and inguinal lymph nodes.  Multiple  foci of discrete hypermetabolic metastases in the right iliac bone, left iliac crest, left and right scapula, and the C1 vertebral body. 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 -3 cycles of R-EPOCH from 05/11/2019 through 06/20/2019. -He tolerated last cycle very well.  He did not have any reactions to full dose rituximab. -His PET scan was not scheduled after cycle 3.  We will proceed with cycle 4 today.  I will schedule him with a PET scan next available spot. -I have reviewed his recent CT scan which showed decrease in size of abdominal adenopathy. -He was instructed to take prednisone 60 mg for 5 days. -We reviewed his labs.  Hemoglobin is 7.  I have recommended 1 unit of PRBC.  2.  Bilateral leg DVT: -Ultrasound Doppler on 04/18/2019 shows positive bilateral leg DVT. -He will continue Eliquis.  3.  Right hydronephrosis: -Cystoscopy and bilateral JJ stent placement on 04/21/2019. -He does have occasional hematuria.  4.  Weight loss: -He lost 25 pounds after cycle 1. -He is slowly gaining back.  He gained 4 pounds since last visit.  5.  Hypophosphatemia: -His phosphate is normal today.  He will continue Neutra-Phos 3 times a day.  6.  Anxiety: -He is taking Ativan 0.5 mg 1-2 times a day.  7.  TLS prophylaxis: -He will continue allopurinol 300 mg daily.  8.  Hypertension: -  He will continue losartan 100 mg daily.  He is also on Toprol-XL 50 mg daily and Norvasc 10 mg daily.       Orders placed this encounter:  Orders Placed This Encounter  Procedures  . NM PET Image Restag (PS) Skull Base To Thigh  . Lactate dehydrogenase      Derek Jack, MD [place] [phone]

## 2019-07-13 ENCOUNTER — Inpatient Hospital Stay (HOSPITAL_COMMUNITY): Payer: BLUE CROSS/BLUE SHIELD

## 2019-07-13 ENCOUNTER — Encounter (HOSPITAL_COMMUNITY): Payer: Self-pay

## 2019-07-13 VITALS — BP 137/53 | HR 72 | Temp 96.9°F | Resp 18

## 2019-07-13 DIAGNOSIS — C8338 Diffuse large B-cell lymphoma, lymph nodes of multiple sites: Secondary | ICD-10-CM | POA: Diagnosis not present

## 2019-07-13 DIAGNOSIS — Z5189 Encounter for other specified aftercare: Secondary | ICD-10-CM | POA: Diagnosis not present

## 2019-07-13 DIAGNOSIS — Z5112 Encounter for antineoplastic immunotherapy: Secondary | ICD-10-CM | POA: Diagnosis not present

## 2019-07-13 DIAGNOSIS — Z95828 Presence of other vascular implants and grafts: Secondary | ICD-10-CM

## 2019-07-13 DIAGNOSIS — D649 Anemia, unspecified: Secondary | ICD-10-CM | POA: Diagnosis not present

## 2019-07-13 DIAGNOSIS — Z5111 Encounter for antineoplastic chemotherapy: Secondary | ICD-10-CM | POA: Diagnosis not present

## 2019-07-13 MED ORDER — DIPHENHYDRAMINE HCL 25 MG PO CAPS
25.0000 mg | ORAL_CAPSULE | Freq: Once | ORAL | Status: AC
  Administered 2019-07-13: 25 mg via ORAL
  Filled 2019-07-13: qty 1

## 2019-07-13 MED ORDER — VINCRISTINE SULFATE CHEMO INJECTION 1 MG/ML
Freq: Once | INTRAVENOUS | Status: AC
  Filled 2019-07-13: qty 10

## 2019-07-13 MED ORDER — SODIUM CHLORIDE 0.9 % IV SOLN
Freq: Once | INTRAVENOUS | Status: AC
  Administered 2019-07-13: 8 mg via INTRAVENOUS
  Filled 2019-07-13: qty 4

## 2019-07-13 MED ORDER — SODIUM CHLORIDE 0.9% FLUSH
10.0000 mL | INTRAVENOUS | Status: AC | PRN
  Administered 2019-07-13: 11:00:00 10 mL

## 2019-07-13 MED ORDER — SODIUM CHLORIDE 0.9% IV SOLUTION
250.0000 mL | Freq: Once | INTRAVENOUS | Status: AC
  Administered 2019-07-13: 12:00:00 250 mL via INTRAVENOUS

## 2019-07-13 MED ORDER — HYDROCODONE-ACETAMINOPHEN 5-325 MG PO TABS: 1.0000 | ORAL_TABLET | Freq: Three times a day (TID) | ORAL | 0 refills | Status: DC | PRN

## 2019-07-13 MED ORDER — SODIUM CHLORIDE 0.9 % IV SOLN: INTRAVENOUS | Status: DC

## 2019-07-13 MED ORDER — ACETAMINOPHEN 325 MG PO TABS
650.0000 mg | ORAL_TABLET | Freq: Once | ORAL | Status: AC
  Administered 2019-07-13: 11:00:00 650 mg via ORAL
  Filled 2019-07-13: qty 2

## 2019-07-13 NOTE — Progress Notes (Signed)
Kevin [name] tolerated blood transfusion and initiation of ambulatory pump well without complaints or incident. VSS upon discharge. Pt discharged with pump infusing without issues. Pt discharged self ambulatory in satisfactory condition

## 2019-07-13 NOTE — Patient Instructions (Addendum)
[facility] Discharge Instructions for Patients Receiving Chemotherapy   Beginning January 23rd 2017 lab work for the [facility] will be done in the  Main lab at [facility] on 1st floor. If you have a lab appointment with the [place] please come in thru the  Main Entrance and check in at the main information desk   Today you received the following chemotherapy agents Adriamycin,Vincristine and VP-16 in continuous pump as well as blood transfusion. Follow-up as scheduled. Call clinic for any questions or concerns  To help prevent nausea and vomiting after your treatment, we encourage you to take your nausea medication   If you develop nausea and vomiting, or diarrhea that is not controlled by your medication, call the clinic.  The clinic phone number is [phone]. Office hours are Monday-Friday 8:30am-5:00pm.  BELOW ARE SYMPTOMS THAT SHOULD BE REPORTED IMMEDIATELY:  *FEVER GREATER THAN 101.0 F  *CHILLS WITH OR WITHOUT FEVER  NAUSEA AND VOMITING THAT IS NOT CONTROLLED WITH YOUR NAUSEA MEDICATION  *UNUSUAL SHORTNESS OF BREATH  *UNUSUAL BRUISING OR BLEEDING  TENDERNESS IN MOUTH AND THROAT WITH OR WITHOUT PRESENCE OF ULCERS  *URINARY PROBLEMS  *BOWEL PROBLEMS  UNUSUAL RASH Items with * indicate a potential emergency and should be followed up as soon as possible. If you have an emergency after office hours please contact your primary care physician or go to the nearest emergency department.  Please call the clinic during office hours if you have any questions or concerns.   You may also contact the Patient Navigator at [phone] should you have any questions or need assistance in obtaining follow up care.      Resources For Cancer Patients and their Caregivers ? American Cancer Society: Can assist with transportation, wigs, general needs, runs Look Good Feel Better.        708-754-0604 ? Cancer Care: Provides financial assistance,  online support groups, medication/co-pay assistance.  1-800-813-HOPE (780) 849-5421) ? [place] Assists [place] cancer patients and their families through emotional , educational and financial support.  740-109-4426 ? [place] DSS Where to apply for food stamps, Medicaid and utility assistance. 803-433-2276 ? [employer]: Transportation to medical appointments. (830)138-3556 ? Social Security Administration: May apply for disability if have a Stage IV cancer. (323)548-6870 (856)005-9896 ? [employer], Disability and Transit Services: Assists with nutrition, care and transit needs. 214-101-8236

## 2019-07-14 ENCOUNTER — Encounter (HOSPITAL_COMMUNITY): Payer: Self-pay

## 2019-07-14 ENCOUNTER — Other Ambulatory Visit: Payer: Self-pay

## 2019-07-14 ENCOUNTER — Inpatient Hospital Stay (HOSPITAL_COMMUNITY): Payer: BLUE CROSS/BLUE SHIELD

## 2019-07-14 VITALS — BP 146/61 | HR 67 | Temp 97.5°F | Resp 18

## 2019-07-14 DIAGNOSIS — Z5189 Encounter for other specified aftercare: Secondary | ICD-10-CM | POA: Diagnosis not present

## 2019-07-14 DIAGNOSIS — C8338 Diffuse large B-cell lymphoma, lymph nodes of multiple sites: Secondary | ICD-10-CM

## 2019-07-14 DIAGNOSIS — D649 Anemia, unspecified: Secondary | ICD-10-CM | POA: Diagnosis not present

## 2019-07-14 DIAGNOSIS — Z5111 Encounter for antineoplastic chemotherapy: Secondary | ICD-10-CM | POA: Diagnosis not present

## 2019-07-14 DIAGNOSIS — Z5112 Encounter for antineoplastic immunotherapy: Secondary | ICD-10-CM | POA: Diagnosis not present

## 2019-07-14 DIAGNOSIS — Z95828 Presence of other vascular implants and grafts: Secondary | ICD-10-CM

## 2019-07-14 LAB — TYPE AND SCREEN
ABO/RH(D): B POS
Antibody Screen: NEGATIVE
Unit division: 0

## 2019-07-14 LAB — BPAM RBC
Blood Product Expiration Date: 202102102359
ISSUE DATE / TIME: 202101121202
Unit Type and Rh: 5100

## 2019-07-14 MED ORDER — SODIUM CHLORIDE 0.9% FLUSH
10.0000 mL | INTRAVENOUS | Status: DC | PRN
  Administered 2019-07-14: 10 mL via INTRAVENOUS

## 2019-07-14 MED ORDER — VINCRISTINE SULFATE CHEMO INJECTION 1 MG/ML
Freq: Once | INTRAVENOUS | Status: AC
  Filled 2019-07-14: qty 10

## 2019-07-14 MED ORDER — SODIUM CHLORIDE 0.9 % IV SOLN: INTRAVENOUS | Status: DC

## 2019-07-14 MED ORDER — SODIUM CHLORIDE 0.9 % IV SOLN
Freq: Once | INTRAVENOUS | Status: AC
  Administered 2019-07-14: 8 mg via INTRAVENOUS
  Filled 2019-07-14: qty 4

## 2019-07-14 NOTE — Progress Notes (Signed)
[name] tolerated initiation of ambulatory pump well without complaints or incident. Pt discharged with ambulatory pump infusing without issues. VSS Pt discharged self ambulatory in satisfactory condition

## 2019-07-14 NOTE — Patient Instructions (Signed)
[facility] Discharge Instructions for Patients Receiving Chemotherapy   Beginning January 23rd 2017 lab work for the [facility] will be done in the  Main lab at [facility] on 1st floor. If you have a lab appointment with the [place] please come in thru the  Main Entrance and check in at the main information desk   Today you received the following chemotherapy agents Adriamycin,vincristin and VP-16. Follow-up as scheduled. Call clinic for any questions or concerns  To help prevent nausea and vomiting after your treatment, we encourage you to take your nausea medication   If you develop nausea and vomiting, or diarrhea that is not controlled by your medication, call the clinic.  The clinic phone number is [phone]. Office hours are Monday-Friday 8:30am-5:00pm.  BELOW ARE SYMPTOMS THAT SHOULD BE REPORTED IMMEDIATELY:  *FEVER GREATER THAN 101.0 F  *CHILLS WITH OR WITHOUT FEVER  NAUSEA AND VOMITING THAT IS NOT CONTROLLED WITH YOUR NAUSEA MEDICATION  *UNUSUAL SHORTNESS OF BREATH  *UNUSUAL BRUISING OR BLEEDING  TENDERNESS IN MOUTH AND THROAT WITH OR WITHOUT PRESENCE OF ULCERS  *URINARY PROBLEMS  *BOWEL PROBLEMS  UNUSUAL RASH Items with * indicate a potential emergency and should be followed up as soon as possible. If you have an emergency after office hours please contact your primary care physician or go to the nearest emergency department.  Please call the clinic during office hours if you have any questions or concerns.   You may also contact the Patient Navigator at [phone] should you have any questions or need assistance in obtaining follow up care.      Resources For Cancer Patients and their Caregivers ? American Cancer Society: Can assist with transportation, wigs, general needs, runs Look Good Feel Better.        (904) 473-2148 ? Cancer Care: Provides financial assistance, online support groups, medication/co-pay assistance.   1-800-813-HOPE 4014099164) ? [place] Assists [place] cancer patients and their families through emotional , educational and financial support.  769-578-2979 ? [place] DSS Where to apply for food stamps, Medicaid and utility assistance. 517 622 6456 ? [facility]: Transportation to medical appointments. (985) 182-5743 ? Social Security Administration: May apply for disability if have a Stage IV cancer. [phone] ? [employer], Disability and Transit Services: Assists with nutrition, care and transit needs. [phone]

## 2019-07-15 ENCOUNTER — Inpatient Hospital Stay (HOSPITAL_COMMUNITY): Payer: BLUE CROSS/BLUE SHIELD

## 2019-07-15 VITALS — BP 140/58 | HR 59 | Temp 97.3°F | Resp 18

## 2019-07-15 DIAGNOSIS — Z5189 Encounter for other specified aftercare: Secondary | ICD-10-CM | POA: Diagnosis not present

## 2019-07-15 DIAGNOSIS — Z5112 Encounter for antineoplastic immunotherapy: Secondary | ICD-10-CM | POA: Diagnosis not present

## 2019-07-15 DIAGNOSIS — Z95828 Presence of other vascular implants and grafts: Secondary | ICD-10-CM

## 2019-07-15 DIAGNOSIS — Z5111 Encounter for antineoplastic chemotherapy: Secondary | ICD-10-CM | POA: Diagnosis not present

## 2019-07-15 DIAGNOSIS — C8338 Diffuse large B-cell lymphoma, lymph nodes of multiple sites: Secondary | ICD-10-CM | POA: Diagnosis not present

## 2019-07-15 DIAGNOSIS — D649 Anemia, unspecified: Secondary | ICD-10-CM | POA: Diagnosis not present

## 2019-07-15 MED ORDER — VINCRISTINE SULFATE CHEMO INJECTION 1 MG/ML
Freq: Once | INTRAVENOUS | Status: AC
  Filled 2019-07-15: qty 10

## 2019-07-15 MED ORDER — SODIUM CHLORIDE 0.9 % IV SOLN
Freq: Once | INTRAVENOUS | Status: AC
  Administered 2019-07-15: 8 mg via INTRAVENOUS
  Filled 2019-07-15: qty 4

## 2019-07-15 MED ORDER — SODIUM CHLORIDE 0.9 % IV SOLN: Freq: Once | INTRAVENOUS | Status: AC

## 2019-07-15 NOTE — Patient Instructions (Signed)
[facility] Discharge Instructions for Patients Receiving Chemotherapy  Today you received the following chemotherapy agents   To help prevent nausea and vomiting after your treatment, we encourage you to take your nausea medication   If you develop nausea and vomiting that is not controlled by your nausea medication, call the clinic.   BELOW ARE SYMPTOMS THAT SHOULD BE REPORTED IMMEDIATELY:  *FEVER GREATER THAN 100.5 F  *CHILLS WITH OR WITHOUT FEVER  NAUSEA AND VOMITING THAT IS NOT CONTROLLED WITH YOUR NAUSEA MEDICATION  *UNUSUAL SHORTNESS OF BREATH  *UNUSUAL BRUISING OR BLEEDING  TENDERNESS IN MOUTH AND THROAT WITH OR WITHOUT PRESENCE OF ULCERS  *URINARY PROBLEMS  *BOWEL PROBLEMS  UNUSUAL RASH Items with * indicate a potential emergency and should be followed up as soon as possible.  Feel free to call the clinic should you have any questions or concerns. The clinic phone number is [phone].  Please show the CHEMO ALERT CARD at check-in to the Emergency Department and triage nurse.   

## 2019-07-15 NOTE — Progress Notes (Unsigned)
Patient presents today for treatment. Vital signs within parameters for treatment. Patient has no complaints of any changes since the last treatment.   Treatment given today per MD orders. Vital signs stable. No complaints at this time. Discharged from clinic ambulatory. F/U with [facility] as scheduled.   Treatment given today per MD orders. Tolerated infusion without adverse affects. Vital signs stable. No complaints at this time. Discharged from clinic ambulatory. F/U with [facility] as scheduled.

## 2019-07-16 ENCOUNTER — Inpatient Hospital Stay (HOSPITAL_COMMUNITY): Payer: BLUE CROSS/BLUE SHIELD

## 2019-07-16 ENCOUNTER — Other Ambulatory Visit: Payer: Self-pay

## 2019-07-16 VITALS — BP 147/59 | HR 63 | Temp 97.5°F | Resp 18 | Wt 194.0 lb

## 2019-07-16 DIAGNOSIS — Z5189 Encounter for other specified aftercare: Secondary | ICD-10-CM | POA: Diagnosis not present

## 2019-07-16 DIAGNOSIS — Z5111 Encounter for antineoplastic chemotherapy: Secondary | ICD-10-CM | POA: Diagnosis not present

## 2019-07-16 DIAGNOSIS — Z95828 Presence of other vascular implants and grafts: Secondary | ICD-10-CM

## 2019-07-16 DIAGNOSIS — D649 Anemia, unspecified: Secondary | ICD-10-CM | POA: Diagnosis not present

## 2019-07-16 DIAGNOSIS — Z5112 Encounter for antineoplastic immunotherapy: Secondary | ICD-10-CM | POA: Diagnosis not present

## 2019-07-16 DIAGNOSIS — C8338 Diffuse large B-cell lymphoma, lymph nodes of multiple sites: Secondary | ICD-10-CM

## 2019-07-16 MED ORDER — SODIUM CHLORIDE 0.9 % IV SOLN
Freq: Once | INTRAVENOUS | Status: AC
  Administered 2019-07-16: 16 mg via INTRAVENOUS
  Filled 2019-07-16: qty 8

## 2019-07-16 MED ORDER — SODIUM CHLORIDE 0.9 % IV SOLN
750.0000 mg/m2 | Freq: Once | INTRAVENOUS | Status: AC
  Administered 2019-07-16: 12:00:00 1560 mg via INTRAVENOUS
  Filled 2019-07-16: qty 78

## 2019-07-16 MED ORDER — SODIUM CHLORIDE 0.9 % IV SOLN: INTRAVENOUS | Status: DC

## 2019-07-16 NOTE — Progress Notes (Signed)
Continuous ambulatory pump disconnected today per protocol.    Treatment given per orders. Patient tolerated it well without problems. Vitals stable and discharged home from clinic ambulatory. Follow up as scheduled.

## 2019-07-19 ENCOUNTER — Inpatient Hospital Stay (HOSPITAL_COMMUNITY): Payer: BLUE CROSS/BLUE SHIELD

## 2019-07-19 ENCOUNTER — Encounter (HOSPITAL_COMMUNITY): Payer: Self-pay

## 2019-07-19 ENCOUNTER — Other Ambulatory Visit: Payer: Self-pay

## 2019-07-19 VITALS — BP 129/57 | HR 85 | Temp 98.2°F | Resp 18

## 2019-07-19 DIAGNOSIS — C8338 Diffuse large B-cell lymphoma, lymph nodes of multiple sites: Secondary | ICD-10-CM

## 2019-07-19 DIAGNOSIS — Z5111 Encounter for antineoplastic chemotherapy: Secondary | ICD-10-CM | POA: Diagnosis not present

## 2019-07-19 DIAGNOSIS — D649 Anemia, unspecified: Secondary | ICD-10-CM | POA: Diagnosis not present

## 2019-07-19 DIAGNOSIS — Z5189 Encounter for other specified aftercare: Secondary | ICD-10-CM | POA: Diagnosis not present

## 2019-07-19 DIAGNOSIS — Z5112 Encounter for antineoplastic immunotherapy: Secondary | ICD-10-CM | POA: Diagnosis not present

## 2019-07-19 DIAGNOSIS — Z95828 Presence of other vascular implants and grafts: Secondary | ICD-10-CM

## 2019-07-19 MED ORDER — PEGFILGRASTIM-JMDB 6 MG/0.6ML ~~LOC~~ SOSY
6.0000 mg | PREFILLED_SYRINGE | Freq: Once | SUBCUTANEOUS | Status: AC
  Administered 2019-07-19: 10:00:00 6 mg via SUBCUTANEOUS
  Filled 2019-07-19: qty 0.6

## 2019-07-19 NOTE — Progress Notes (Signed)
[name] tolerated Fulphila injection well without complaints or incident. VSS Pt discharged self ambulatory in satisfactory condition

## 2019-07-19 NOTE — Patient Instructions (Signed)
[facility] at [facility] Discharge Instructions  Received Fulphila injection today. Follow-up as scheduled. Call clinic for any questions or concerns   Thank you for choosing [facility] at [facility] to provide your oncology and hematology care.  To afford each patient quality time with our provider, please arrive at least 15 minutes before your scheduled appointment time.   If you have a lab appointment with the Cancer Center please come in thru the Main Entrance and check in at the main information desk.  You need to re-schedule your appointment should you arrive 10 or more minutes late.  We strive to give you quality time with our providers, and arriving late affects you and other patients whose appointments are after yours.  Also, if you no show three or more times for appointments you may be dismissed from the clinic at the providers discretion.     Again, thank you for choosing [facility].  Our hope is that these requests will decrease the amount of time that you wait before being seen by our physicians.       _____________________________________________________________  Should you have questions after your visit to [facility], please contact our office at [phone] between the hours of 8:00 a.m. and 4:30 p.m.  Voicemails left after 4:00 p.m. will not be returned until the following business day.  For prescription refill requests, have your pharmacy contact our office and allow 72 hours.    Due to Covid, you will need to wear a mask upon entering the hospital. If you do not have a mask, a mask will be given to you at the Main Entrance upon arrival. For doctor visits, patients may have 1 support person with them. For treatment visits, patients can not have anyone with them due to social distancing guidelines and our immunocompromised population.     

## 2019-07-20 ENCOUNTER — Ambulatory Visit (HOSPITAL_COMMUNITY)
Admission: RE | Admit: 2019-07-20 | Discharge: 2019-07-20 | Disposition: A | Discharge status: Home / Self Care | Payer: BLUE CROSS/BLUE SHIELD | Source: Ambulatory Visit | Attending: Hematology | Admitting: Hematology

## 2019-07-20 DIAGNOSIS — C8338 Diffuse large B-cell lymphoma, lymph nodes of multiple sites: Secondary | ICD-10-CM | POA: Diagnosis not present

## 2019-07-20 DIAGNOSIS — C8330 Diffuse large B-cell lymphoma, unspecified site: Secondary | ICD-10-CM | POA: Diagnosis not present

## 2019-07-20 LAB — GLUCOSE, CAPILLARY: Glucose-Capillary: 100 mg/dL — ABNORMAL HIGH (ref 70–99)

## 2019-07-20 MED ORDER — FLUDEOXYGLUCOSE F - 18 (FDG) INJECTION
10.3400 | Freq: Once | INTRAVENOUS | Status: AC | PRN
  Administered 2019-07-20: 10.34 via INTRAVENOUS

## 2019-07-21 ENCOUNTER — Other Ambulatory Visit: Payer: Self-pay

## 2019-07-21 ENCOUNTER — Ambulatory Visit (INDEPENDENT_AMBULATORY_CARE_PROVIDER_SITE_OTHER): Payer: BLUE CROSS/BLUE SHIELD | Admitting: Urology

## 2019-07-21 ENCOUNTER — Encounter: Payer: Self-pay | Admitting: Urology

## 2019-07-21 VITALS — BP 133/73 | HR 80 | Temp 96.7°F

## 2019-07-21 DIAGNOSIS — N135 Crossing vessel and stricture of ureter without hydronephrosis: Secondary | ICD-10-CM | POA: Insufficient documentation

## 2019-07-21 MED ORDER — PHENAZOPYRIDINE HCL 100 MG PO TABS: 100.0000 mg | ORAL_TABLET | Freq: Three times a day (TID) | ORAL | 0 refills | Status: DC | PRN

## 2019-07-21 MED ORDER — TAMSULOSIN HCL 0.4 MG PO CAPS: 0.4000 mg | ORAL_CAPSULE | Freq: Every day | ORAL | 3 refills | Status: DC

## 2019-07-21 NOTE — Progress Notes (Signed)
07/21/2019 9:08 AM   Kevin [name] [birth year] [id number]  Referring provider: [address], [address],  [place] [address]  Bilateral malignant ureteral obstruction  HPI: Mr Kevin [name] is a [phone] with a hx of lymphoma and bilateral ureteral obstruction s/p b/l ureteral stent placement 04/21/2019. CT stone study from 12/22 showed decreased in pelvic lymph nodes and no hydronephrosis. I reviewed the CT with the patient and the images. He has dysuria with the stent in place. No flank pain. No UTI   PMH: Past Medical History:  Diagnosis Date  . Diffuse large B cell lymphoma ([place]) 04/26/2019  . Hyperlipidemia 02/01/2014  . Hypertension   . Leg DVT (deep venous thromboembolism), acute, bilateral ([place]) 04/18/2019  . Port-A-Cath in place 04/27/2019    Surgical History: Past Surgical History:  Procedure Laterality Date  . CYSTOSCOPY W/ URETERAL STENT PLACEMENT Bilateral 04/21/2019   Procedure: CYSTOSCOPY WITH RETROGRADE PYELOGRAM/URETERAL STENT PLACEMENT;  Surgeon: Cleon Gustin, MD;  Location: [facility];  Service: Urology;  Laterality: Bilateral;  . INGUINAL LYMPH NODE BIOPSY Right 04/21/2019   Procedure: INGUINAL LYMPH NODE BIOPSY;  Surgeon: Virl Cagey, MD;  Location: [facility];  Service: General;  Laterality: Right;  . PORTACATH PLACEMENT Left 05/07/2019   Procedure: INSERTION PORT-A-CATH (catheter left subclavian);  Surgeon: Virl Cagey, MD;  Location: [facility];  Service: General;  Laterality: Left;    Home Medications:  Allergies as of 07/21/2019   No Known Allergies     Medication List       Accurate as of July 21, 2019  9:08 AM. If you have any questions, ask your nurse or doctor.        allopurinol 300 MG tablet Commonly known as: ZYLOPRIM TAKE 1 TABLET BY MOUTH EVERY DAY   amLODipine 10 MG tablet Commonly known as: NORVASC Take 1 tablet (10 mg total) by mouth daily.   apixaban 5 MG Tabs tablet Commonly known as: ELIQUIS Take 1  tablet (5 mg total) by mouth 2 (two) times daily.   CYCLOPHOSPHAMIDE IV Inject into the vein every 21 ( twenty-one) days.   docusate sodium 100 MG capsule Commonly known as: COLACE Take 100 mg by mouth 2 (two) times daily.   DOXORUBICIN HCL IV Inject into the vein every 21 ( twenty-one) days.   ETOPOSIDE IV Inject into the vein.   HYDROcodone-acetaminophen 5-325 MG tablet Commonly known as: NORCO/VICODIN Take 1 tablet by mouth every 8 (eight) hours as needed for moderate pain.   hydrocortisone 20 MG tablet Commonly known as: CORTEF Take 20 mg by mouth 5 (five) times daily as needed.   LORazepam 0.5 MG tablet Commonly known as: ATIVAN Take 1 tablet (0.5 mg total) by mouth every 8 (eight) hours.   losartan 100 MG tablet Commonly known as: COZAAR Take 1 tablet (100 mg total) by mouth daily.   magic mouthwash Soln Swish 69ml for 5 minutes then spit out.   metoprolol succinate 50 MG 24 hr tablet Commonly known as: TOPROL-XL Take 1 tablet (50 mg total) by mouth daily.   mupirocin ointment 2 % Commonly known as: Bactroban Place 1 application into the nose 2 (two) times daily.   OXYGEN Inhale 2 L into the lungs 3 (three) times daily as needed (oxygen level below 93).   Phospha 250 Neutral [id number] MG Tabs TAKE 1 TABLET BY MOUTH THREE TIMES A DAY   predniSONE 20 MG tablet Commonly known as: DELTASONE TAKE 3 TABLETS (60 MG TOTAL) BY  MOUTH DAILY. TAKE ON DAYS 1-5 OF CHEMOTHERAPY.   prochlorperazine 10 MG tablet Commonly known as: COMPAZINE Take 10 mg by mouth every 6 (six) hours as needed for nausea or vomiting.   RITUXIMAB IV Inject into the vein every 21 ( twenty-one) days.   vinCRIStine 2 mg in sodium chloride 0.9 % 50 mL Inject 2 mg into the vein every 21 ( twenty-one) days.       Allergies: No Known Allergies  Family History: Family History  Problem Relation Age of Onset  . Diabetes Brother   . Diabetes Mother   . Leukemia Brother   . Diabetes Son      Social History:  reports that he quit smoking about 20 years ago. He has never used smokeless tobacco. He reports current alcohol use of about 56.0 standard drinks of alcohol per week. He reports that he does not use drugs.  ROS:  Review of systems is negative except what noted above in HPI                                      Physical Exam: There were no vitals taken for this visit.  Constitutional:  Alert and oriented, No acute distress. HEENT: [place] AT, moist mucus membranes.  Trachea midline, no masses. Cardiovascular: No clubbing, cyanosis, or edema. Respiratory: Normal respiratory effort, no increased work of breathing. GI: Abdomen is soft, nontender, nondistended, no abdominal masses GU: No CVA tenderness Lymph: No cervical or inguinal lymphadenopathy. Skin: No rashes, bruises or suspicious lesions. Neurologic: Grossly intact, no focal deficits, moving all 4 extremities. Psychiatric: Normal mood and affect.  Laboratory Data: Lab Results  Component Value Date   WBC 18.7 (H) 07/12/2019   HGB 7.0 (L) 07/12/2019   HCT 23.6 (L) 07/12/2019   MCV 90.8 07/12/2019   PLT 511 (H) 07/12/2019    Lab Results  Component Value Date   CREATININE 0.61 07/12/2019    No results found for: PSA  No results found for: TESTOSTERONE  No results found for: HGBA1C  Urinalysis    Component Value Date/Time   COLORURINE YELLOW 06/26/2019 0915   APPEARANCEUR CLEAR 06/26/2019 0915   LABSPEC 1.011 06/26/2019 0915   PHURINE 6.0 06/26/2019 0915   GLUCOSEU NEGATIVE 06/26/2019 0915   HGBUR LARGE (A) 06/26/2019 0915   BILIRUBINUR NEGATIVE 06/26/2019 0915   KETONESUR NEGATIVE 06/26/2019 0915   PROTEINUR 100 (A) 06/26/2019 0915   NITRITE NEGATIVE 06/26/2019 0915   LEUKOCYTESUR SMALL (A) 06/26/2019 0915    Lab Results  Component Value Date   BACTERIA NONE SEEN 06/26/2019    Pertinent Imaging: CT stone study No results found for this or any previous  visit. Results for orders placed during the hospital encounter of 04/17/19  US Venous Img Lower Bilateral   Narrative CLINICAL DATA:  Bilateral lower extremity pain and edema for the past week. Shortness of breath. Patient is on heparin. Evaluate for DVT.  EXAM: BILATERAL LOWER EXTREMITY VENOUS DOPPLER ULTRASOUND  TECHNIQUE: Gray-scale sonography with graded compression, as well as color Doppler and duplex ultrasound were performed to evaluate the lower extremity deep venous systems from the level of the common femoral vein and including the common femoral, femoral, profunda femoral, popliteal and calf veins including the posterior tibial, peroneal and gastrocnemius veins when visible. The superficial great saphenous vein was also interrogated. Spectral Doppler was utilized to evaluate flow at rest and with distal augmentation maneuvers  in the common femoral, femoral and popliteal veins.  COMPARISON:  Nuclear medicine V/Q scan-04/17/2019; chest radiograph-earlier same day  FINDINGS: RIGHT LOWER EXTREMITY  There is hypoechoic expansile near occlusive thrombus within the right common femoral vein (image 3), extending to involve the saphenofemoral junction (image 9).  There is hypoechoic near occlusive thrombus involving the proximal aspect of the right deep femoral vein (image 12).  There is hypoechoic occlusive thrombus involving the proximal (image 14) mid (image 17) and distal (image 22) aspects of the right femoral vein.  There is hypoechoic nonocclusive thrombus within the right popliteal vein (image 27 extending to involve the right posterior tibial (image 31) and peroneal (image 33) veins. The anterior tibial vein appears patent where imaged.  Other Findings:  None.  _________________________________________________________  LEFT LOWER EXTREMITY  Common Femoral Vein: No evidence of thrombus. Normal compressibility, respiratory phasicity and response to  augmentation.  Saphenofemoral Junction: No evidence of thrombus. Normal compressibility and flow on color Doppler imaging.  Profunda Femoral Vein: No evidence of thrombus. Normal compressibility and flow on color Doppler imaging.  Femoral Vein: No evidence of thrombus. Normal compressibility, respiratory phasicity and response to augmentation.  Popliteal Vein: No evidence of thrombus. Normal compressibility, respiratory phasicity and response to augmentation.  Calf Veins: There is hypoechoic occlusive expansile thrombus within the left posterior tibial (image 72) as well as the left peroneal (image 75) veins. The anterior tibial vein appears patent where imaged.  Superficial Great Saphenous Vein: No evidence of thrombus. Normal compressibility.  Other Findings: There is an approximately 2.8 x 3.2 x 2.7 cm hypoechoic nodule within the left groin which appears to demonstrate internal blood flow and may represent a pathologically enlarged left inguinal lymph node.  IMPRESSION: 1. The examination is positive for rather extensive predominantly occlusive right lower extremity DVT extending from the right common femoral vein through the imaged tibial veins. 2. The examination is positive for occlusive DVT involving the left posterior tibial and peroneal veins. 3. Indeterminate approximately 3.2 cm hypoechoic nodule within left groin, indeterminate though could represent a pathologically enlarged lymph node. Further evaluation with contrast-enhanced CT scan could be performed as indicated.   Electronically Signed   By: Sandi Mariscal M.D.   On: 04/18/2019 11:19    No results found for this or any previous visit. No results found for this or any previous visit. Results for orders placed during the hospital encounter of 04/17/19  US RENAL   Narrative CLINICAL DATA:  Acute renal insufficiency.  EXAM: RENAL / URINARY TRACT ULTRASOUND COMPLETE  COMPARISON:   None.  FINDINGS: Right Kidney:  Renal measurements: 12.6 x 6.3 x 8.4 cm = volume: 347 mL. Mild left hydronephrosis identified.  Left Kidney:  Renal measurements: 13 x 6.9 x 7.2 cm = volume: 336 mL. Echogenicity within normal limits. No mass or hydronephrosis visualized.  Bladder:  The bladder is decompressed.  Other:  There is a complex mass in the right side of the pelvis/right lower quadrant with some internal blood flow.  IMPRESSION: 1. There is a complex mass in the right lower quadrant/right pelvis, with internal blood flow. A CT scan may better evaluate. Malignancy not excluded. 2. Mild hydronephrosis associated with the right kidney, age indeterminate. Recommend clinical correlation.   Electronically Signed   By: Dorise Bullion III M.D   On: 04/18/2019 12:50    No results found for this or any previous visit. No results found for this or any previous visit. Results for orders placed during the  hospital encounter of 06/22/19  CT RENAL STONE STUDY   Narrative CLINICAL DATA:  Hydronephrosis. Ureteral stents. High-grade lymphoma.  EXAM: CT ABDOMEN AND PELVIS WITHOUT CONTRAST  TECHNIQUE: Multidetector CT imaging of the abdomen and pelvis was performed following the standard protocol without IV contrast.  COMPARISON:  PET-CT scan 05/03/2019, CT 04/18/2019  FINDINGS: Lower chest: 6 mm nodule at the LEFT lung base not evident on prior. Interval resolution of the pleural fluid seen on comparison PET-CT scan  Hepatobiliary: No focal hepatic lesion. No biliary duct dilatation. Gallbladder is normal. Common bile duct is normal.  Pancreas: Pancreas is normal. No ductal dilatation. No pancreatic inflammation.  Spleen: Normal spleen  Adrenals/urinary tract: Nodule of the LEFT adrenal gland has low attenuation consistent benign adenoma.  bilateral double-J ureteral stents. No hydronephrosis on the LEFT or the RIGHT. Stents terminate in the bladder. No  calculi along course of the stents.  Bladder normal.  Stomach/Bowel: Stomach, small bowel, appendix, and cecum are normal. The colon and rectosigmoid colon are normal.  Vascular/Lymphatic: Abdominal aorta calcified.  The central mesenteric nodal mass is decreased in size compared to prior. Mass measures approximately 10.0 x 4.5 cm (image 37/2) decreased from 13.0 x 8.4 cm.  Likewise the RIGHT external iliac and operator lymphadenopathy is decreased. Example RIGHT external iliac node measuring 14 mm short axis previously measured 33 mm on CT PET 05/03/2019. No new adenopathy.  Reproductive: Prostate normal  Other: No free fluid.  Musculoskeletal: No aggressive osseous lesion.  IMPRESSION: 1. Bilateral double-J ureteral stents in place without complicating features. No hydronephrosis. 2. Reduction in RIGHT operator and iliac adenopathy adjacent to the ureteral stent. 3. Interval reduction in size of the central mesenteric nodal mass. 4. Nodule in the RIGHT lower lobe not seen on prior. Recommend attention on routine follow-up   Electronically Signed   By: Suzy Bouchard M.D.   On: 06/22/2019 16:52     Assessment & Plan:    1. Bilateral ureteral obstruction -continue stents -RTC 3 months with repeat CT stone study. If RP nodes decreased with will remove the stents.  -Flomax and pyridium for stent discomfort  No follow-ups on file.  Nicolette Bang, MD  [facility] [place]

## 2019-07-21 NOTE — Patient Instructions (Signed)

## 2019-07-21 NOTE — Progress Notes (Signed)
Urological Symptom Review  Patient is experiencing the following symptoms:  None  Review of Systems  Gastrointestinal (upper)  : Negative for upper GI symptoms  Gastrointestinal (lower) : Negative for lower GI symptoms  Constitutional : Negative for symptoms  Skin: Negative for skin symptoms  Eyes: Negative for eye symptoms  Ear/Nose/Throat : Negative for Ear/Nose/Throat symptoms  Hematologic/Lymphatic: Negative for Hematologic/Lymphatic symptoms  Cardiovascular : Negative for cardiovascular symptoms  Respiratory : Negative for respiratory symptoms  Endocrine: Excessive thirst  Musculoskeletal: Negative for musculoskeletal symptoms  Neurological: Negative for neurological symptoms  Psychologic: Negative for psychiatric symptoms

## 2019-07-22 ENCOUNTER — Inpatient Hospital Stay (HOSPITAL_COMMUNITY): Payer: BLUE CROSS/BLUE SHIELD | Admitting: Hematology

## 2019-07-22 VITALS — BP 143/53 | HR 73 | Temp 97.8°F | Resp 18 | Wt 178.9 lb

## 2019-07-22 DIAGNOSIS — D649 Anemia, unspecified: Secondary | ICD-10-CM | POA: Diagnosis not present

## 2019-07-22 DIAGNOSIS — Z5111 Encounter for antineoplastic chemotherapy: Secondary | ICD-10-CM | POA: Diagnosis not present

## 2019-07-22 DIAGNOSIS — Z5112 Encounter for antineoplastic immunotherapy: Secondary | ICD-10-CM | POA: Diagnosis not present

## 2019-07-22 DIAGNOSIS — C8338 Diffuse large B-cell lymphoma, lymph nodes of multiple sites: Secondary | ICD-10-CM

## 2019-07-22 DIAGNOSIS — Z5189 Encounter for other specified aftercare: Secondary | ICD-10-CM | POA: Diagnosis not present

## 2019-07-22 LAB — COMPREHENSIVE METABOLIC PANEL
ALT: 18 U/L (ref 0–44)
AST: 12 U/L — ABNORMAL LOW (ref 15–41)
Albumin: 3 g/dL — ABNORMAL LOW (ref 3.5–5.0)
Alkaline Phosphatase: 65 U/L (ref 38–126)
Anion gap: 7 (ref 5–15)
BUN: 15 mg/dL (ref 8–23)
CO2: 29 mmol/L (ref 22–32)
Calcium: 8.6 mg/dL — ABNORMAL LOW (ref 8.9–10.3)
Chloride: 102 mmol/L (ref 98–111)
Creatinine, Ser: 0.49 mg/dL — ABNORMAL LOW (ref 0.61–1.24)
GFR calc Af Amer: >60 mL/min (ref >60)
GFR calc non Af Amer: >60 mL/min (ref >60)
Glucose, Bld: 119 mg/dL — ABNORMAL HIGH (ref 70–99)
Potassium: 4.4 mmol/L (ref 3.5–5.1)
Sodium: 138 mmol/L (ref 135–145)
Total Bilirubin: 0.5 mg/dL (ref 0.3–1.2)
Total Protein: 5.4 g/dL — ABNORMAL LOW (ref 6.5–8.1)

## 2019-07-22 LAB — CBC WITH DIFFERENTIAL/PLATELET
Abs Immature Granulocytes: 0.2 10*3/uL — ABNORMAL HIGH (ref 0.00–0.07)
Basophils Absolute: 0 10*3/uL (ref 0.0–0.1)
Basophils Relative: 1 %
Eosinophils Absolute: 0.2 10*3/uL (ref 0.0–0.5)
Eosinophils Relative: 6 %
HCT: 23.8 % — ABNORMAL LOW (ref 39.0–52.0)
Hemoglobin: 7.1 g/dL — ABNORMAL LOW (ref 13.0–17.0)
Immature Granulocytes: 5 %
Lymphocytes Relative: 11 %
Lymphs Abs: 0.4 10*3/uL — ABNORMAL LOW (ref 0.7–4.0)
MCH: 27.4 pg (ref 26.0–34.0)
MCHC: 29.8 g/dL — ABNORMAL LOW (ref 30.0–36.0)
MCV: 91.9 fL (ref 80.0–100.0)
Monocytes Absolute: 0.1 10*3/uL (ref 0.1–1.0)
Monocytes Relative: 4 %
Neutro Abs: 2.8 10*3/uL (ref 1.7–7.7)
Neutrophils Relative %: 73 %
Platelets: 140 10*3/uL — ABNORMAL LOW (ref 150–400)
RBC: 2.59 MIL/uL — ABNORMAL LOW (ref 4.22–5.81)
RDW: 16.7 % — ABNORMAL HIGH (ref 11.5–15.5)
WBC: 3.8 10*3/uL — ABNORMAL LOW (ref 4.0–10.5)
nRBC: 0 % (ref 0.0–0.2)

## 2019-07-22 NOTE — Progress Notes (Signed)
[address], [place] [address]   CLINIC:  Medical Oncology/Hematology  PCP:  Loman Brooklyn, [address] [place] [address] [phone]   REASON FOR VISIT:  Follow-up for high-grade lymphoma.  CURRENT THERAPY: Dose adjusted R-EPOCH  BRIEF ONCOLOGIC HISTORY:  Oncology History  Diffuse large B-cell lymphoma of lymph nodes of multiple regions ([place])  04/26/2019 Initial Diagnosis   Diffuse large B-cell lymphoma of lymph nodes of multiple regions ([place])   05/10/2019 -  Chemotherapy   The patient had pegfilgrastim (NEULASTA ONPRO KIT) injection 6 mg, 6 mg, Subcutaneous, Once, 1 of 1 cycle pegfilgrastim-jmdb (FULPHILA) injection 6 mg, 6 mg, Subcutaneous,  Once, 4 of 5 cycles Administration: 6 mg (05/18/2019), 6 mg (06/07/2019), 6 mg (06/26/2019), 6 mg (07/19/2019) DOXOrubicin (ADRIAMYCIN) 20 mg, etoposide (VEPESID) 104 mg, vinCRIStine (ONCOVIN) 0.8 mg in sodium chloride 0.9 % 500 mL chemo infusion, , Intravenous, Once, 4 of 5 cycles Administration:  (05/11/2019),  (05/31/2019),  (06/01/2019),  (05/12/2019),  (05/13/2019),  (05/14/2019),  (06/02/2019),  (06/03/2019),  (06/20/2019),  (06/21/2019),  (06/22/2019),  (07/12/2019),  (06/23/2019),  (07/13/2019),  (07/14/2019),  (07/15/2019) ondansetron (ZOFRAN) 8 mg in sodium chloride 0.9 % 50 mL IVPB, , Intravenous,  Once, 4 of 5 cycles Administration: 8 mg (05/10/2019), 8 mg (05/11/2019), 16 mg (05/17/2019), 8 mg (06/01/2019), 16 mg (06/04/2019), 8 mg (05/12/2019), 8 mg (05/13/2019), 8 mg (05/14/2019), 8 mg (06/02/2019), 8 mg (06/03/2019), 8 mg (06/20/2019), 16 mg (06/24/2019), 8 mg (06/22/2019), 8 mg (07/12/2019), 8 mg (06/23/2019), 8 mg (07/13/2019), 16 mg (07/16/2019), 8 mg (07/14/2019), 8 mg (07/15/2019) cyclophosphamide (CYTOXAN) 1,560 mg in sodium chloride 0.9 % 250 mL chemo infusion, 750 mg/m2 = 1,560 mg, Intravenous,  Once, 4 of 5 cycles Administration: 1,560 mg (05/17/2019), 1,560 mg (06/04/2019), 1,560 mg  (06/24/2019), 1,560 mg (07/16/2019) riTUXimab-pvvr (RUXIENCE) 800 mg in sodium chloride 0.9 % 250 mL (2.4242 mg/mL) infusion, 375 mg/m2 = 800 mg, Intravenous,  Once, 4 of 5 cycles Dose modification: 100 mg (original dose 375 mg/m2, Cycle 1, Reason: Other (see comments), Comment: test dose), 600 mg (original dose 375 mg/m2, Cycle 1, Reason: Other (see comments), Comment: due to previous reaction giving test dose first), 400 mg (original dose 375 mg/m2, Cycle 2, Reason: Other (see comments), Comment: remainder of day before fluid - do not bill) Administration: 800 mg (05/10/2019), 800 mg (05/31/2019), 100 mg (05/17/2019), 400 mg (06/01/2019), 800 mg (06/21/2019), 800 mg (07/12/2019)  for chemotherapy treatment.    05/11/2019 - 05/11/2019 Chemotherapy   The patient had DOXOrubicin (ADRIAMYCIN) chemo injection 104 mg, 50 mg/m2, Intravenous,  Once, 0 of 6 cycles palonosetron (ALOXI) injection 0.25 mg, 0.25 mg, Intravenous,  Once, 0 of 6 cycles pegfilgrastim-jmdb (FULPHILA) injection 6 mg, 6 mg, Subcutaneous,  Once, 0 of 6 cycles vinCRIStine (ONCOVIN) 2 mg in sodium chloride 0.9 % 50 mL chemo infusion, 2 mg, Intravenous,  Once, 0 of 6 cycles cyclophosphamide (CYTOXAN) 1,540 mg in sodium chloride 0.9 % 250 mL chemo infusion, 750 mg/m2, Intravenous,  Once, 0 of 6 cycles fosaprepitant (EMEND) 150 mg, dexamethasone (DECADRON) 12 mg in sodium chloride 0.9 % 145 mL IVPB, , Intravenous,  Once, 0 of 6 cycles riTUXimab-pvvr (RUXIENCE) 800 mg in sodium chloride 0.9 % 250 mL (2.4242 mg/mL) infusion, 375 mg/m2, Intravenous,  Once, 0 of 6 cycles  for chemotherapy treatment.       CANCER STAGING: Cancer Staging No matching staging information was found for the patient.   INTERVAL HISTORY:  Mr.  Quintero 64 y.o. male seen for follow-up of double hit lymphoma.  He received cycle 4 of chemotherapy on 07/12/2019.  He had PET scan done on 07/20/2019.  He is accompanied by his daughter.  Denies any nausea, vomiting or  diarrhea.  Reports some urinary frequency which is stable.  Appetite is 75%.  Energy levels are 50%.  Denies any tingling or numbness in extremities.  No jaw pain reported.  REVIEW OF SYSTEMS:  Review of Systems  All other systems reviewed and are negative.    PAST MEDICAL/SURGICAL HISTORY:  Past Medical History:  Diagnosis Date  . Diffuse large B cell lymphoma ([place]) 04/26/2019  . Hyperlipidemia 02/01/2014  . Hypertension   . Leg DVT (deep venous thromboembolism), acute, bilateral ([place]) 04/18/2019  . Port-A-Cath in place 04/27/2019   Past Surgical History:  Procedure Laterality Date  . CYSTOSCOPY W/ URETERAL STENT PLACEMENT Bilateral 04/21/2019   Procedure: CYSTOSCOPY WITH RETROGRADE PYELOGRAM/URETERAL STENT PLACEMENT;  Surgeon: Cleon Gustin, MD;  Location: [facility];  Service: Urology;  Laterality: Bilateral;  . INGUINAL LYMPH NODE BIOPSY Right 04/21/2019   Procedure: INGUINAL LYMPH NODE BIOPSY;  Surgeon: Virl Cagey, MD;  Location: [facility];  Service: General;  Laterality: Right;  . PORTACATH PLACEMENT Left 05/07/2019   Procedure: INSERTION PORT-A-CATH (catheter left subclavian);  Surgeon: Virl Cagey, MD;  Location: [facility];  Service: General;  Laterality: Left;     SOCIAL HISTORY:  Social History   Socioeconomic History  . Marital status: Married    Spouse name: Not on file  . Number of children: Not on file  . Years of education: Not on file  . Highest education level: Not on file  Occupational History  . Not on file  Tobacco Use  . Smoking status: Former Smoker    Quit date: 04/27/1999    Years since quitting: 20.2  . Smokeless tobacco: Never Used  Substance and Sexual Activity  . Alcohol use: Yes    Alcohol/week: 56.0 standard drinks    Types: 56 Cans of beer per week  . Drug use: Never  . Sexual activity: Not on file  Other Topics Concern  . Not on file  Social History Narrative  . Not on file   Social Determinants of Health   Financial  Resource Strain:   . Difficulty of Paying Living Expenses: Not on file  Food Insecurity:   . Worried About Charity fundraiser in the Last Year: Not on file  . Ran Out of Food in the Last Year: Not on file  Transportation Needs:   . Lack of Transportation (Medical): Not on file  . Lack of Transportation (Non-Medical): Not on file  Physical Activity:   . Days of Exercise per Week: Not on file  . Minutes of Exercise per Session: Not on file  Stress:   . Feeling of Stress : Not on file  Social Connections:   . Frequency of Communication with Friends and Family: Not on file  . Frequency of Social Gatherings with Friends and Family: Not on file  . Attends Religious Services: Not on file  . Active Member of Clubs or Organizations: Not on file  . Attends Archivist Meetings: Not on file  . Marital Status: Not on file  Intimate Partner Violence:   . Fear of Current or Ex-Partner: Not on file  . Emotionally Abused: Not on file  . Physically Abused: Not on file  . Sexually Abused: Not on file  FAMILY HISTORY:  Family History  Problem Relation Age of Onset  . Diabetes Brother   . Diabetes Mother   . Leukemia Brother   . Diabetes Son     CURRENT MEDICATIONS:  Outpatient Encounter Medications as of 07/22/2019  Medication Sig  . allopurinol (ZYLOPRIM) 300 MG tablet TAKE 1 TABLET BY MOUTH EVERY DAY  . amLODipine (NORVASC) 10 MG tablet Take 1 tablet (10 mg total) by mouth daily.  Marland Kitchen apixaban (ELIQUIS) 5 MG TABS tablet Take 1 tablet (5 mg total) by mouth 2 (two) times daily.  . CYCLOPHOSPHAMIDE IV Inject into the vein every 21 ( twenty-one) days.  Marland Kitchen docusate sodium (COLACE) 100 MG capsule Take 100 mg by mouth 2 (two) times daily.  Marland Kitchen DOXORUBICIN HCL IV Inject into the vein every 21 ( twenty-one) days.  . ETOPOSIDE IV Inject into the vein.  Marland Kitchen loratadine (CLARITIN) 10 MG tablet Take 10 mg by mouth daily.  Marland Kitchen LORazepam (ATIVAN) 0.5 MG tablet Take 1 tablet (0.5 mg total) by mouth  every 8 (eight) hours.  Marland Kitchen losartan (COZAAR) 100 MG tablet Take 1 tablet (100 mg total) by mouth daily.  . metoprolol succinate (TOPROL-XL) 50 MG 24 hr tablet Take 1 tablet (50 mg total) by mouth daily.  . phenazopyridine (PYRIDIUM) 100 MG tablet Take 1 tablet (100 mg total) by mouth 3 (three) times daily as needed for pain.  . predniSONE (DELTASONE) 20 MG tablet TAKE 3 TABLETS (60 MG TOTAL) BY MOUTH DAILY. TAKE ON DAYS 1-5 OF CHEMOTHERAPY.  Marland Kitchen RITUXIMAB IV Inject into the vein every 21 ( twenty-one) days.  . tamsulosin (FLOMAX) 0.4 MG CAPS capsule Take 1 capsule (0.4 mg total) by mouth daily after supper.  . vinCRIStine 2 mg in sodium chloride 0.9 % 50 mL Inject 2 mg into the vein every 21 ( twenty-one) days.  . [DISCONTINUED] K Phos Mono-Sod Phos Di & Mono (PHOSPHA 250 NEUTRAL) 155-852-130 MG TABS TAKE 1 TABLET BY MOUTH THREE TIMES A DAY  . HYDROcodone-acetaminophen (NORCO/VICODIN) 5-325 MG tablet Take 1 tablet by mouth every 8 (eight) hours as needed for moderate pain. (Patient not taking: Reported on 07/22/2019)  . magic mouthwash SOLN Swish 35m for 5 minutes then spit out. (Patient not taking: Reported on 07/22/2019)  . mupirocin ointment (BACTROBAN) 2 % Place 1 application into the nose 2 (two) times daily. (Patient not taking: Reported on 07/21/2019)  . prochlorperazine (COMPAZINE) 10 MG tablet Take 10 mg by mouth every 6 (six) hours as needed for nausea or vomiting.  . [DISCONTINUED] hydrocortisone (CORTEF) 20 MG tablet Take 20 mg by mouth 5 (five) times daily as needed.  . [DISCONTINUED] OXYGEN Inhale 2 L into the lungs 3 (three) times daily as needed (oxygen level below 93).    Facility-Administered Encounter Medications as of 07/22/2019  Medication  . 0.9 %  sodium chloride infusion    ALLERGIES:  No Known Allergies   PHYSICAL EXAM:  ECOG Performance status: 1  Vitals:   07/22/19 1607  BP: (!) 143/53  Pulse: 73  Resp: 18  Temp: 97.8 F (36.6 C)  SpO2: 100%   Filed Weights    07/22/19 1607  Weight: 178 lb 14.4 oz (81.1 kg)    Physical Exam Vitals reviewed.  Constitutional:      Appearance: Normal appearance.  Cardiovascular:     Rate and Rhythm: Normal rate and regular rhythm.     Heart sounds: Normal heart sounds.  Pulmonary:     Effort: Pulmonary effort is normal.  Breath sounds: Normal breath sounds.  Abdominal:     General: There is no distension.     Palpations: Abdomen is soft. There is no mass.  Musculoskeletal:        General: No swelling.  Skin:    General: Skin is warm.  Neurological:     General: No focal deficit present.     Mental Status: He is alert and oriented to person, place, and time.  Psychiatric:        Mood and Affect: Mood normal.        Behavior: Behavior normal.      LABORATORY DATA:  I have reviewed the labs as listed.  CBC    Component Value Date/Time   WBC 3.8 (L) 07/22/2019 1632   RBC 2.59 (L) 07/22/2019 1632   HGB 7.1 (L) 07/22/2019 1632   HCT 23.8 (L) 07/22/2019 1632   PLT 140 (L) 07/22/2019 1632   MCV 91.9 07/22/2019 1632   MCH 27.4 07/22/2019 1632   MCHC 29.8 (L) 07/22/2019 1632   RDW 16.7 (H) 07/22/2019 1632   LYMPHSABS 0.4 (L) 07/22/2019 1632   MONOABS 0.1 07/22/2019 1632   EOSABS 0.2 07/22/2019 1632   BASOSABS 0.0 07/22/2019 1632   CMP Latest Ref Rng & Units 07/22/2019 07/12/2019 06/23/2019  Glucose 70 - 99 mg/dL 119(H) 116(H) 175(H)  BUN 8 - 23 mg/dL _0 Creatinine 0.61 - 1.24 mg/dL 0.49(L) 0.61 0.61  Sodium 135 - 145 mmol/L 138 141 142  Potassium 3.5 - 5.1 mmol/L 4.4 3.9 3.4(L)  Chloride 98 - 111 mmol/L 102 104 106  CO2 22 - 32 mmol/L _1 Calcium 8.9 - 10.3 mg/dL 8.6(L) 9.1 8.5(L)  Total Protein 6.5 - 8.1 g/dL 5.4(L) 5.9(L) 5.2(L)  Total Bilirubin 0.3 - 1.2 mg/dL 0.5 0.3 0.3  Alkaline Phos 38 - 126 U/L 65 63 64  AST 15 - 41 U/L 12(L) 16 17  ALT 0 - 44 U/L _2 DIAGNOSTIC IMAGING:  I have independently reviewed the scans and discussed with the  patient.     ASSESSMENT & PLAN:   Diffuse large B-cell lymphoma of lymph nodes of multiple regions ([place]) 1.  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 -PET scan on 05/03/2019 shows bulky intensely hypermetabolic peritoneal and retroperitoneal nodal mass, nodularity.  Additional hypermetabolic adenopathy involving the axilla, mediastinum, iliac and inguinal lymph nodes.  Multiple foci of discrete hypermetabolic metastases in the right iliac bone, left iliac crest, left and right scapula, and the C1 vertebral body. 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 -4 cycles of R-EPOCH from 05/11/2019 through 07/12/2019. -We reviewed results of PET scan from 07/20/2019.  There is good response in the mesenteric mass and bone lesions.  There is a questionable new lesion in the left angle of the jaw.  He does not have any masses on palpation.  There is also questionable right lower lobe lung nodules.  This will need follow-up. -We reviewed his labs.  Hemoglobin is 7.1.  We will arrange for 1 unit of blood transfusion. -He will come back on 08/02/2019 for cycle 5.  2.  Bilateral leg DVT: -Ultrasound Doppler on 04/18/2019 shows positive bilateral leg DVT. -He will continue Eliquis.  3.  Right hydronephrosis: -Cystoscopy and bilateral JJ stent placement on 04/21/2019. -He does have occasional hematuria.  4.  Weight loss: -He lost 25 pounds after cycle 1. -He is slowly gaining back.  He gained 4 pounds since last visit.  5.  Hypophosphatemia: -His phosphate is normal today.  He will continue Neutra-Phos 3 times a day.  6.  Anxiety: -He is taking Ativan 0.5 mg 1-2 times a day.  7.  TLS prophylaxis: -He will continue allopurinol 300 mg daily.  8.  Hypertension: -He will continue losartan 100 mg daily.  He is also on Toprol-XL 50 mg daily and Norvasc 10 mg  daily.       Orders placed this encounter:  Orders Placed This Encounter  Procedures  . CBC with Differential/Platelet  . Comprehensive metabolic panel      Derek Jack, MD [place] [phone]

## 2019-07-22 NOTE — Patient Instructions (Addendum)
[place] at [facility] Discharge Instructions  You were seen today by Dr. Delton Coombes. He went over your recent lab and scan results. He will see you back as scheduled for labs, treatment and follow up.   Thank you for choosing [place] at [facility] to provide your oncology and hematology care.  To afford each patient quality time with our provider, please arrive at least 15 minutes before your scheduled appointment time.   If you have a lab appointment with the [place] please come in thru the  Main Entrance and check in at the main information desk  You need to re-schedule your appointment should you arrive 10 or more minutes late.  We strive to give you quality time with our providers, and arriving late affects you and other patients whose appointments are after yours.  Also, if you no show three or more times for appointments you may be dismissed from the clinic at the providers discretion.     Again, thank you for choosing [facility].  Our hope is that these requests will decrease the amount of time that you wait before being seen by our physicians.       _____________________________________________________________  Should you have questions after your visit to [facility], please contact our office at [phone] between the hours of 8:00 a.m. and 4:30 p.m.  Voicemails left after 4:00 p.m. will not be returned until the following business day.  For prescription refill requests, have your pharmacy contact our office and allow 72 hours.    Cancer Center Support Programs:   > Cancer Support Group  2nd Tuesday of the month 1pm-2pm, Journey Room

## 2019-07-23 ENCOUNTER — Other Ambulatory Visit: Payer: Self-pay

## 2019-07-23 ENCOUNTER — Inpatient Hospital Stay (HOSPITAL_COMMUNITY): Payer: BLUE CROSS/BLUE SHIELD

## 2019-07-23 ENCOUNTER — Encounter (HOSPITAL_COMMUNITY): Payer: Self-pay

## 2019-07-23 VITALS — BP 116/52 | HR 61 | Temp 96.9°F | Resp 18 | Wt 179.6 lb

## 2019-07-23 DIAGNOSIS — C8338 Diffuse large B-cell lymphoma, lymph nodes of multiple sites: Secondary | ICD-10-CM

## 2019-07-23 DIAGNOSIS — Z5112 Encounter for antineoplastic immunotherapy: Secondary | ICD-10-CM | POA: Diagnosis not present

## 2019-07-23 DIAGNOSIS — Z5189 Encounter for other specified aftercare: Secondary | ICD-10-CM | POA: Diagnosis not present

## 2019-07-23 DIAGNOSIS — Z5111 Encounter for antineoplastic chemotherapy: Secondary | ICD-10-CM | POA: Diagnosis not present

## 2019-07-23 DIAGNOSIS — D649 Anemia, unspecified: Secondary | ICD-10-CM | POA: Diagnosis not present

## 2019-07-23 LAB — PREPARE RBC (CROSSMATCH)

## 2019-07-23 MED ORDER — DIPHENHYDRAMINE HCL 25 MG PO CAPS
25.0000 mg | ORAL_CAPSULE | Freq: Once | ORAL | Status: AC
  Administered 2019-07-23: 25 mg via ORAL

## 2019-07-23 MED ORDER — ACETAMINOPHEN 325 MG PO TABS
ORAL_TABLET | ORAL | Status: AC
  Filled 2019-07-23: qty 2

## 2019-07-23 MED ORDER — DIPHENHYDRAMINE HCL 25 MG PO CAPS
ORAL_CAPSULE | ORAL | Status: AC
  Filled 2019-07-23: qty 1

## 2019-07-23 MED ORDER — ACETAMINOPHEN 325 MG PO TABS
650.0000 mg | ORAL_TABLET | Freq: Once | ORAL | Status: AC
  Administered 2019-07-23: 650 mg via ORAL

## 2019-07-23 MED ORDER — SODIUM CHLORIDE 0.9% IV SOLUTION
250.0000 mL | Freq: Once | INTRAVENOUS | Status: AC
  Administered 2019-07-23: 250 mL via INTRAVENOUS

## 2019-07-23 MED ORDER — HEPARIN SOD (PORK) LOCK FLUSH 100 UNIT/ML IV SOLN
500.0000 [IU] | Freq: Every day | INTRAVENOUS | Status: AC | PRN
  Administered 2019-07-23: 500 [IU]

## 2019-07-23 MED ORDER — SODIUM CHLORIDE 0.9% FLUSH
10.0000 mL | INTRAVENOUS | Status: AC | PRN
  Administered 2019-07-23: 10 mL

## 2019-07-23 NOTE — Patient Instructions (Signed)
[place] at [facility] Discharge Instructions  Received 1 unit of blood today. Follow-up as scheduled. Call clinic for any questions or concerns   Thank you for choosing [place] at [facility] to provide your oncology and hematology care.  To afford each patient quality time with our provider, please arrive at least 15 minutes before your scheduled appointment time.   If you have a lab appointment with the [place] please come in thru the Main Entrance and check in at the main information desk.  You need to re-schedule your appointment should you arrive 10 or more minutes late.  We strive to give you quality time with our providers, and arriving late affects you and other patients whose appointments are after yours.  Also, if you no show three or more times for appointments you may be dismissed from the clinic at the providers discretion.     Again, thank you for choosing [facility].  Our hope is that these requests will decrease the amount of time that you wait before being seen by our physicians.       _____________________________________________________________  Should you have questions after your visit to [facility], please contact our office at [phone] between the hours of 8:00 a.m. and 4:30 p.m.  Voicemails left after 4:00 p.m. will not be returned until the following business day.  For prescription refill requests, have your pharmacy contact our office and allow 72 hours.    Due to Covid, you will need to wear a mask upon entering the hospital. If you do not have a mask, a mask will be given to you at the Main Entrance upon arrival. For doctor visits, patients may have 1 support person with them. For treatment visits, patients can not have anyone with them due to social distancing guidelines and our immunocompromised population.

## 2019-07-23 NOTE — Progress Notes (Signed)
Kevin [name] tolerated blood trnasfusion well without complaints or incident. VSS upon discharge. Pt discharged self ambulatory in satisfactory condition

## 2019-07-24 LAB — BPAM RBC
Blood Product Expiration Date: 202102102359
ISSUE DATE / TIME: 202101221236
Unit Type and Rh: 1700

## 2019-07-24 LAB — TYPE AND SCREEN
ABO/RH(D): B POS
Antibody Screen: NEGATIVE
Unit division: 0

## 2019-07-26 ENCOUNTER — Other Ambulatory Visit (HOSPITAL_COMMUNITY): Payer: Self-pay | Admitting: *Deleted

## 2019-07-26 MED ORDER — PHOSPHA 250 NEUTRAL 155-852-130 MG PO TABS: 1.0000 | ORAL_TABLET | Freq: Three times a day (TID) | ORAL | 0 refills | Status: DC

## 2019-08-01 NOTE — Assessment & Plan Note (Signed)
1.  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 -PET scan on 05/03/2019 shows bulky intensely hypermetabolic peritoneal and retroperitoneal nodal mass, nodularity.  Additional hypermetabolic adenopathy involving the axilla, mediastinum, iliac and inguinal lymph nodes.  Multiple foci of discrete hypermetabolic metastases in the right iliac bone, left iliac crest, left and right scapula, and the C1 vertebral body. 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 -4 cycles of R-EPOCH from 05/11/2019 through 07/12/2019. -We reviewed results of PET scan from 07/20/2019.  There is good response in the mesenteric mass and bone lesions.  There is a questionable new lesion in the left angle of the jaw.  He does not have any masses on palpation.  There is also questionable right lower lobe lung nodules.  This will need follow-up. -We reviewed his labs.  Hemoglobin is 7.1.  We will arrange for 1 unit of blood transfusion. -He will come back on 08/02/2019 for cycle 5.  2.  Bilateral leg DVT: -Ultrasound Doppler on 04/18/2019 shows positive bilateral leg DVT. -He will continue Eliquis.  3.  Right hydronephrosis: -Cystoscopy and bilateral JJ stent placement on 04/21/2019. -He does have occasional hematuria.  4.  Weight loss: -He lost 25 pounds after cycle 1. -He is slowly gaining back.  He gained 4 pounds since last visit.  5.  Hypophosphatemia: -His phosphate is normal today.  He will continue Neutra-Phos 3 times a day.  6.  Anxiety: -He is taking Ativan 0.5 mg 1-2 times a day.  7.  TLS prophylaxis: -He will continue allopurinol 300 mg daily.  8.  Hypertension: -He will continue losartan 100 mg daily.  He is also on Toprol-XL 50 mg daily and Norvasc 10 mg daily.

## 2019-08-02 ENCOUNTER — Other Ambulatory Visit: Payer: Self-pay

## 2019-08-02 ENCOUNTER — Encounter (HOSPITAL_COMMUNITY): Payer: Self-pay | Admitting: Hematology

## 2019-08-02 ENCOUNTER — Inpatient Hospital Stay (HOSPITAL_COMMUNITY): Payer: BLUE CROSS/BLUE SHIELD | Admitting: Hematology

## 2019-08-02 ENCOUNTER — Inpatient Hospital Stay (HOSPITAL_COMMUNITY): Payer: BLUE CROSS/BLUE SHIELD

## 2019-08-02 ENCOUNTER — Inpatient Hospital Stay (HOSPITAL_COMMUNITY): Payer: BLUE CROSS/BLUE SHIELD | Attending: Hematology

## 2019-08-02 VITALS — BP 125/46 | HR 69 | Temp 97.1°F | Resp 18

## 2019-08-02 DIAGNOSIS — Z5111 Encounter for antineoplastic chemotherapy: Secondary | ICD-10-CM | POA: Diagnosis not present

## 2019-08-02 DIAGNOSIS — C8338 Diffuse large B-cell lymphoma, lymph nodes of multiple sites: Secondary | ICD-10-CM | POA: Insufficient documentation

## 2019-08-02 DIAGNOSIS — Z7189 Other specified counseling: Secondary | ICD-10-CM | POA: Insufficient documentation

## 2019-08-02 DIAGNOSIS — Z95828 Presence of other vascular implants and grafts: Secondary | ICD-10-CM

## 2019-08-02 DIAGNOSIS — Z5112 Encounter for antineoplastic immunotherapy: Secondary | ICD-10-CM | POA: Insufficient documentation

## 2019-08-02 LAB — CBC WITH DIFFERENTIAL/PLATELET
Abs Immature Granulocytes: 0.87 10*3/uL — ABNORMAL HIGH (ref 0.00–0.07)
Basophils Absolute: 0.2 10*3/uL — ABNORMAL HIGH (ref 0.0–0.1)
Basophils Relative: 1 %
Eosinophils Absolute: 0.2 10*3/uL (ref 0.0–0.5)
Eosinophils Relative: 1 %
HCT: 29.7 % — ABNORMAL LOW (ref 39.0–52.0)
Hemoglobin: 8.9 g/dL — ABNORMAL LOW (ref 13.0–17.0)
Immature Granulocytes: 6 %
Lymphocytes Relative: 9 %
Lymphs Abs: 1.3 10*3/uL (ref 0.7–4.0)
MCH: 26.6 pg (ref 26.0–34.0)
MCHC: 30 g/dL (ref 30.0–36.0)
MCV: 88.9 fL (ref 80.0–100.0)
Monocytes Absolute: 1.5 10*3/uL — ABNORMAL HIGH (ref 0.1–1.0)
Monocytes Relative: 10 %
Neutro Abs: 10.9 10*3/uL — ABNORMAL HIGH (ref 1.7–7.7)
Neutrophils Relative %: 73 %
Platelets: 324 10*3/uL (ref 150–400)
RBC: 3.34 MIL/uL — ABNORMAL LOW (ref 4.22–5.81)
RDW: 17.1 % — ABNORMAL HIGH (ref 11.5–15.5)
WBC: 15 10*3/uL — ABNORMAL HIGH (ref 4.0–10.5)
nRBC: 0.1 % (ref 0.0–0.2)

## 2019-08-02 LAB — COMPREHENSIVE METABOLIC PANEL
ALT: 11 U/L (ref 0–44)
AST: 14 U/L — ABNORMAL LOW (ref 15–41)
Albumin: 3.1 g/dL — ABNORMAL LOW (ref 3.5–5.0)
Alkaline Phosphatase: 75 U/L (ref 38–126)
Anion gap: 9 (ref 5–15)
BUN: 13 mg/dL (ref 8–23)
CO2: 28 mmol/L (ref 22–32)
Calcium: 9.1 mg/dL (ref 8.9–10.3)
Chloride: 103 mmol/L (ref 98–111)
Creatinine, Ser: 0.57 mg/dL — ABNORMAL LOW (ref 0.61–1.24)
GFR calc Af Amer: >60 mL/min (ref >60)
GFR calc non Af Amer: >60 mL/min (ref >60)
Glucose, Bld: 112 mg/dL — ABNORMAL HIGH (ref 70–99)
Potassium: 3.7 mmol/L (ref 3.5–5.1)
Sodium: 140 mmol/L (ref 135–145)
Total Bilirubin: 0.6 mg/dL (ref 0.3–1.2)
Total Protein: 5.6 g/dL — ABNORMAL LOW (ref 6.5–8.1)

## 2019-08-02 LAB — PHOSPHORUS: Phosphorus: 3.6 mg/dL (ref 2.5–4.6)

## 2019-08-02 LAB — MAGNESIUM: Magnesium: 1.9 mg/dL (ref 1.7–2.4)

## 2019-08-02 LAB — URIC ACID: Uric Acid, Serum: 3.2 mg/dL — ABNORMAL LOW (ref 3.7–8.6)

## 2019-08-02 MED ORDER — FAMOTIDINE IN NACL 20-0.9 MG/50ML-% IV SOLN
20.0000 mg | Freq: Once | INTRAVENOUS | Status: AC
  Administered 2019-08-02: 20 mg via INTRAVENOUS
  Filled 2019-08-02: qty 50

## 2019-08-02 MED ORDER — PREDNISONE 50 MG PO TABS: 60.0000 mg/m2/d | ORAL_TABLET | Freq: Two times a day (BID) | ORAL | Status: DC

## 2019-08-02 MED ORDER — DIPHENHYDRAMINE HCL 50 MG/ML IJ SOLN
50.0000 mg | Freq: Once | INTRAMUSCULAR | Status: AC
  Administered 2019-08-02: 50 mg via INTRAVENOUS
  Filled 2019-08-02: qty 1

## 2019-08-02 MED ORDER — SODIUM CHLORIDE 0.9 % IV SOLN
Freq: Once | INTRAVENOUS | Status: AC
  Administered 2019-08-02: 8 mg via INTRAVENOUS
  Filled 2019-08-02: qty 4

## 2019-08-02 MED ORDER — ACETAMINOPHEN 325 MG PO TABS
650.0000 mg | ORAL_TABLET | Freq: Once | ORAL | Status: AC
  Administered 2019-08-02: 09:00:00 650 mg via ORAL
  Filled 2019-08-02: qty 2

## 2019-08-02 MED ORDER — SODIUM CHLORIDE 0.9 % IV SOLN: INTRAVENOUS | Status: DC

## 2019-08-02 MED ORDER — VINCRISTINE SULFATE CHEMO INJECTION 1 MG/ML
Freq: Once | INTRAVENOUS | Status: AC
  Filled 2019-08-02: qty 10

## 2019-08-02 MED ORDER — SODIUM CHLORIDE 0.9% FLUSH
10.0000 mL | INTRAVENOUS | Status: DC | PRN
  Administered 2019-08-02: 10 mL

## 2019-08-02 MED ORDER — SODIUM CHLORIDE 0.9 % IV SOLN
375.0000 mg/m2 | Freq: Once | INTRAVENOUS | Status: AC
  Administered 2019-08-02: 10:00:00 800 mg via INTRAVENOUS
  Filled 2019-08-02: qty 30

## 2019-08-02 NOTE — Progress Notes (Signed)
0855- Patient seen by Dr. Delton Coombes and all lab work reviewed. Per MD, ok to proceed with treatment this week.  Kevin [name] tolerated treatment without incident or complaint. VSS upon completion of treatment. Doxirubicin/VP-16/Oncovin infusing through home infusion pump without difficulties. Discharged in satisfactory condition with follow up instructions.

## 2019-08-02 NOTE — Progress Notes (Signed)
Patient has been assessed, vital signs and labs have been reviewed by Dr. Katragadda. ANC, Creatinine, LFTs, and Platelets are within treatment parameters per Dr. Katragadda. The patient is good to proceed with treatment at this time.  

## 2019-08-02 NOTE — Patient Instructions (Addendum)
[place] at [facility] Discharge Instructions  You were seen today by Dr. Delton Coombes. He discussed your PET scan with you.  It showed some concern around your jaw line and teeth, this is related to the dental issues that you have going on. It shows that your cancer has been responding to treatment so we will continue.  We will get labs again in 2 weeks with possible transfusions if needed.  We will get you back for treatment in 3 weeks and you will see Dr. Delton Coombes at that time.      Thank you for choosing [place] at [facility] to provide your oncology and hematology care.  To afford each patient quality time with our provider, please arrive at least 15 minutes before your scheduled appointment time.   If you have a lab appointment with the [place] please come in thru the Main Entrance and check in at the main information desk.  You need to re-schedule your appointment should you arrive 10 or more minutes late.  We strive to give you quality time with our providers, and arriving late affects you and other patients whose appointments are after yours.  Also, if you no show three or more times for appointments you may be dismissed from the clinic at the providers discretion.     Again, thank you for choosing [facility].  Our hope is that these requests will decrease the amount of time that you wait before being seen by our physicians.       _____________________________________________________________  Should you have questions after your visit to [facility], please contact our office at [phone] between the hours of 8:00 a.m. and 4:30 p.m.  Voicemails left after 4:00 p.m. will not be returned until the following business day.  For prescription refill requests, have your pharmacy contact our office and allow 72 hours.    Due to Covid, you will need to wear a mask upon entering the hospital. If you do not have  a mask, a mask will be given to you at the Main Entrance upon arrival. For doctor visits, patients may have 1 support person with them. For treatment visits, patients can not have anyone with them due to social distancing guidelines and our immunocompromised population.

## 2019-08-02 NOTE — Progress Notes (Signed)
[address] [place], [place] [address]   CLINIC:  Medical Oncology/Hematology  PCP:  Loman [place], [place] [address] [phone]   REASON FOR VISIT:  Follow-up for high-grade lymphoma.  CURRENT THERAPY: Dose adjusted R-EPOCH  BRIEF ONCOLOGIC HISTORY:  Oncology History  Diffuse large B-cell lymphoma of lymph nodes of multiple regions ([place])  04/26/2019 Initial Diagnosis   Diffuse large B-cell lymphoma of lymph nodes of multiple regions ([place])   05/10/2019 -  Chemotherapy   The patient had pegfilgrastim (NEULASTA ONPRO KIT) injection 6 mg, 6 mg, Subcutaneous, Once, 1 of 1 cycle pegfilgrastim-jmdb (FULPHILA) injection 6 mg, 6 mg, Subcutaneous,  Once, 5 of 6 cycles Administration: 6 mg (05/18/2019), 6 mg (06/07/2019), 6 mg (06/26/2019), 6 mg (07/19/2019) DOXOrubicin (ADRIAMYCIN) 20 mg, etoposide (VEPESID) 104 mg, vinCRIStine (ONCOVIN) 0.8 mg in sodium chloride 0.9 % 500 mL chemo infusion, , Intravenous, Once, 5 of 6 cycles Administration:  (05/11/2019),  (05/31/2019),  (06/01/2019),  (05/12/2019),  (05/13/2019),  (05/14/2019),  (06/02/2019),  (06/03/2019),  (06/20/2019),  (06/21/2019),  (06/22/2019),  (07/12/2019),  (06/23/2019),  (07/13/2019),  (07/14/2019),  (07/15/2019),  (08/02/2019) ondansetron (ZOFRAN) 8 mg in sodium chloride 0.9 % 50 mL IVPB, , Intravenous,  Once, 5 of 6 cycles Administration: 8 mg (05/10/2019), 8 mg (05/11/2019), 16 mg (05/17/2019), 8 mg (06/01/2019), 16 mg (06/04/2019), 8 mg (05/12/2019), 8 mg (05/13/2019), 8 mg (05/14/2019), 8 mg (06/02/2019), 8 mg (06/03/2019), 8 mg (06/20/2019), 16 mg (06/24/2019), 8 mg (06/22/2019), 8 mg (07/12/2019), 8 mg (06/23/2019), 8 mg (07/13/2019), 16 mg (07/16/2019), 8 mg (07/14/2019), 8 mg (07/15/2019), 8 mg (08/02/2019) cyclophosphamide (CYTOXAN) 1,560 mg in sodium chloride 0.9 % 250 mL chemo infusion, 750 mg/m2 = 1,560 mg, Intravenous,  Once, 5 of 6 cycles Administration: 1,560 mg (05/17/2019), 1,560  mg (06/04/2019), 1,560 mg (06/24/2019), 1,560 mg (07/16/2019) riTUXimab-pvvr (RUXIENCE) 800 mg in sodium chloride 0.9 % 250 mL (2.4242 mg/mL) infusion, 375 mg/m2 = 800 mg, Intravenous,  Once, 5 of 6 cycles Dose modification: 100 mg (original dose 375 mg/m2, Cycle 1, Reason: Other (see comments), Comment: test dose), 600 mg (original dose 375 mg/m2, Cycle 1, Reason: Other (see comments), Comment: due to previous reaction giving test dose first), 400 mg (original dose 375 mg/m2, Cycle 2, Reason: Other (see comments), Comment: remainder of day before fluid - do not bill) Administration: 800 mg (05/10/2019), 800 mg (05/31/2019), 100 mg (05/17/2019), 400 mg (06/01/2019), 800 mg (06/21/2019), 800 mg (07/12/2019), 800 mg (08/02/2019)  for chemotherapy treatment.    05/11/2019 - 05/11/2019 Chemotherapy   The patient had DOXOrubicin (ADRIAMYCIN) chemo injection 104 mg, 50 mg/m2, Intravenous,  Once, 0 of 6 cycles palonosetron (ALOXI) injection 0.25 mg, 0.25 mg, Intravenous,  Once, 0 of 6 cycles pegfilgrastim-jmdb (FULPHILA) injection 6 mg, 6 mg, Subcutaneous,  Once, 0 of 6 cycles vinCRIStine (ONCOVIN) 2 mg in sodium chloride 0.9 % 50 mL chemo infusion, 2 mg, Intravenous,  Once, 0 of 6 cycles cyclophosphamide (CYTOXAN) 1,540 mg in sodium chloride 0.9 % 250 mL chemo infusion, 750 mg/m2, Intravenous,  Once, 0 of 6 cycles fosaprepitant (EMEND) 150 mg, dexamethasone (DECADRON) 12 mg in sodium chloride 0.9 % 145 mL IVPB, , Intravenous,  Once, 0 of 6 cycles riTUXimab-pvvr (RUXIENCE) 800 mg in sodium chloride 0.9 % 250 mL (2.4242 mg/mL) infusion, 375 mg/m2, Intravenous,  Once, 0 of 6 cycles  for chemotherapy treatment.       CANCER STAGING: Cancer Staging No matching staging information was found for  the patient.   INTERVAL HISTORY:  Mr. Merfeld 64 y.o. male seen for follow-up of for double hit lymphoma and prior to his next cycle of chemotherapy.  Appetite is 75%.  Energy levels are between 50 and 75%.  Mild  fatigue has been stable.  Denies any tingling or numbness next 20s.  Denies any symptoms of PND or orthopnea.  No chest pains or lightheadedness reported.  REVIEW OF SYSTEMS:  Review of Systems  All other systems reviewed and are negative.    PAST MEDICAL/SURGICAL HISTORY:  Past Medical History:  Diagnosis Date  . Diffuse large B cell lymphoma ([place]) 04/26/2019  . Hyperlipidemia 02/01/2014  . Hypertension   . Leg DVT (deep venous thromboembolism), acute, bilateral ([place]) 04/18/2019  . Port-A-Cath in place 04/27/2019   Past Surgical History:  Procedure Laterality Date  . CYSTOSCOPY W/ URETERAL STENT PLACEMENT Bilateral 04/21/2019   Procedure: CYSTOSCOPY WITH RETROGRADE PYELOGRAM/URETERAL STENT PLACEMENT;  Surgeon: Cleon Gustin, MD;  Location: [facility];  Service: Urology;  Laterality: Bilateral;  . INGUINAL LYMPH NODE BIOPSY Right 04/21/2019   Procedure: INGUINAL LYMPH NODE BIOPSY;  Surgeon: Virl Cagey, MD;  Location: [facility];  Service: General;  Laterality: Right;  . PORTACATH PLACEMENT Left 05/07/2019   Procedure: INSERTION PORT-A-CATH (catheter left subclavian);  Surgeon: Virl Cagey, MD;  Location: [facility];  Service: General;  Laterality: Left;     SOCIAL HISTORY:  Social History   Socioeconomic History  . Marital status: Married    Spouse name: Not on file  . Number of children: Not on file  . Years of education: Not on file  . Highest education level: Not on file  Occupational History  . Not on file  Tobacco Use  . Smoking status: Former Smoker    Quit date: 04/27/1999    Years since quitting: 20.2  . Smokeless tobacco: Never Used  Substance and Sexual Activity  . Alcohol use: Yes    Alcohol/week: 56.0 standard drinks    Types: 56 Cans of beer per week  . Drug use: Never  . Sexual activity: Not on file  Other Topics Concern  . Not on file  Social History Narrative  . Not on file   Social Determinants of Health   Financial Resource Strain:   .  Difficulty of Paying Living Expenses: Not on file  Food Insecurity:   . Worried About Charity fundraiser in the Last Year: Not on file  . Ran Out of Food in the Last Year: Not on file  Transportation Needs:   . Lack of Transportation (Medical): Not on file  . Lack of Transportation (Non-Medical): Not on file  Physical Activity:   . Days of Exercise per Week: Not on file  . Minutes of Exercise per Session: Not on file  Stress:   . Feeling of Stress : Not on file  Social Connections:   . Frequency of Communication with Friends and Family: Not on file  . Frequency of Social Gatherings with Friends and Family: Not on file  . Attends Religious Services: Not on file  . Active Member of Clubs or Organizations: Not on file  . Attends Archivist Meetings: Not on file  . Marital Status: Not on file  Intimate Partner Violence:   . Fear of Current or Ex-Partner: Not on file  . Emotionally Abused: Not on file  . Physically Abused: Not on file  . Sexually Abused: Not on file    FAMILY HISTORY:  Family History  Problem Relation Age of Onset  . Diabetes Brother   . Diabetes Mother   . Leukemia Brother   . Diabetes Son     CURRENT MEDICATIONS:  Outpatient Encounter Medications as of 08/02/2019  Medication Sig  . allopurinol (ZYLOPRIM) 300 MG tablet TAKE 1 TABLET BY MOUTH EVERY DAY  . amLODipine (NORVASC) 10 MG tablet Take 1 tablet (10 mg total) by mouth daily.  Marland Kitchen apixaban (ELIQUIS) 5 MG TABS tablet Take 1 tablet (5 mg total) by mouth 2 (two) times daily.  . CYCLOPHOSPHAMIDE IV Inject into the vein every 21 ( twenty-one) days.  Marland Kitchen docusate sodium (COLACE) 100 MG capsule Take 100 mg by mouth 2 (two) times daily.  Marland Kitchen DOXORUBICIN HCL IV Inject into the vein every 21 ( twenty-one) days.  . ETOPOSIDE IV Inject into the vein.  Marland Kitchen HYDROcodone-acetaminophen (NORCO/VICODIN) 5-325 MG tablet Take 1 tablet by mouth every 8 (eight) hours as needed for moderate pain. (Patient not taking: Reported  on 07/22/2019)  . K Phos Mono-Sod Phos Di & Mono (PHOSPHA 250 NEUTRAL) 155-852-130 MG TABS Take 1 tablet by mouth 3 (three) times daily.  Marland Kitchen loratadine (CLARITIN) 10 MG tablet Take 10 mg by mouth daily.  Marland Kitchen LORazepam (ATIVAN) 0.5 MG tablet Take 1 tablet (0.5 mg total) by mouth every 8 (eight) hours.  Marland Kitchen losartan (COZAAR) 100 MG tablet Take 1 tablet (100 mg total) by mouth daily.  . magic mouthwash SOLN Swish 79m for 5 minutes then spit out. (Patient not taking: Reported on 07/22/2019)  . metoprolol succinate (TOPROL-XL) 50 MG 24 hr tablet Take 1 tablet (50 mg total) by mouth daily.  . mupirocin ointment (BACTROBAN) 2 % Place 1 application into the nose 2 (two) times daily. (Patient not taking: Reported on 07/21/2019)  . phenazopyridine (PYRIDIUM) 100 MG tablet Take 1 tablet (100 mg total) by mouth 3 (three) times daily as needed for pain.  . predniSONE (DELTASONE) 20 MG tablet TAKE 3 TABLETS (60 MG TOTAL) BY MOUTH DAILY. TAKE ON DAYS 1-5 OF CHEMOTHERAPY.  .Marland Kitchenprochlorperazine (COMPAZINE) 10 MG tablet Take 10 mg by mouth every 6 (six) hours as needed for nausea or vomiting.  .Marland KitchenRITUXIMAB IV Inject into the vein every 21 ( twenty-one) days.  . tamsulosin (FLOMAX) 0.4 MG CAPS capsule Take 1 capsule (0.4 mg total) by mouth daily after supper.  . vinCRIStine 2 mg in sodium chloride 0.9 % 50 mL Inject 2 mg into the vein every 21 ( twenty-one) days.   Facility-Administered Encounter Medications as of 08/02/2019  Medication  . 0.9 %  sodium chloride infusion    ALLERGIES:  No Known Allergies   PHYSICAL EXAM:  ECOG Performance status: 1  Vitals:   08/02/19 0806  BP: 130/66  Pulse: 69  Resp: 18  Temp: 97.8 F (36.6 C)  SpO2: 97%   Filed Weights   08/02/19 0806  Weight: 180 lb (81.6 kg)    Physical Exam Vitals reviewed.  Constitutional:      Appearance: Normal appearance.  Cardiovascular:     Rate and Rhythm: Normal rate and regular rhythm.     Heart sounds: Normal heart sounds.  Pulmonary:      Effort: Pulmonary effort is normal.     Breath sounds: Normal breath sounds.  Abdominal:     General: There is no distension.     Palpations: Abdomen is soft. There is no mass.  Musculoskeletal:        General: No swelling.  Skin:  General: Skin is warm.  Neurological:     General: No focal deficit present.     Mental Status: He is alert and oriented to person, place, and time.  Psychiatric:        Mood and Affect: Mood normal.        Behavior: Behavior normal.      LABORATORY DATA:  I have reviewed the labs as listed.  CBC    Component Value Date/Time   WBC 15.0 (H) 08/02/2019 0813   RBC 3.34 (L) 08/02/2019 0813   HGB 8.9 (L) 08/02/2019 0813   HCT 29.7 (L) 08/02/2019 0813   PLT 324 08/02/2019 0813   MCV 88.9 08/02/2019 0813   MCH 26.6 08/02/2019 0813   MCHC 30.0 08/02/2019 0813   RDW 17.1 (H) 08/02/2019 0813   LYMPHSABS 1.3 08/02/2019 0813   MONOABS 1.5 (H) 08/02/2019 0813   EOSABS 0.2 08/02/2019 0813   BASOSABS 0.2 (H) 08/02/2019 0813   CMP Latest Ref Rng & Units 08/02/2019 07/22/2019 07/12/2019  Glucose 70 - 99 mg/dL 112(H) 119(H) 116(H)  BUN 8 - 23 mg/dL '13 15 13  '$ Creatinine 0.61 - 1.24 mg/dL 0.57(L) 0.49(L) 0.61  Sodium 135 - 145 mmol/L 140 138 141  Potassium 3.5 - 5.1 mmol/L 3.7 4.4 3.9  Chloride 98 - 111 mmol/L 103 102 104  CO2 22 - 32 mmol/L '28 29 28  '$ Calcium 8.9 - 10.3 mg/dL 9.1 8.6(L) 9.1  Total Protein 6.5 - 8.1 g/dL 5.6(L) 5.4(L) 5.9(L)  Total Bilirubin 0.3 - 1.2 mg/dL 0.6 0.5 0.3  Alkaline Phos 38 - 126 U/L 75 65 63  AST 15 - 41 U/L 14(L) 12(L) 16  ALT 0 - 44 U/L '11 18 17       '$ DIAGNOSTIC IMAGING:  I have independently reviewed the scans and discussed with the patient.     ASSESSMENT & PLAN:   Diffuse large B-cell lymphoma of lymph nodes of multiple regions ([place]) 1.  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 -PET scan on 05/03/2019 shows bulky intensely hypermetabolic peritoneal and retroperitoneal nodal mass, nodularity.  Additional hypermetabolic adenopathy involving the axilla, mediastinum, iliac and inguinal lymph nodes.  Multiple foci of discrete hypermetabolic metastases in the right iliac bone, left iliac crest, left and right scapula, and the C1 vertebral body. 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 -4 cycles of R-EPOCH from 05/11/2019 through 07/12/2019. -I have independently reviewed his PET CT scan and compared with the prior scan.  There is good response in the mesenteric mass and bone lesions.  There is questionable new lesion in the right lower jaw.  I have checked with the patient who has bad dentition in the same area and occasional pain.  There is also questionable right lower lobe lung nodules which will need follow-up. -I do not believe he has any progression on the PET CT scan.  Hence I have recommended proceeding with cycle 5 today.  I have reviewed his labs which are adequate to proceed with treatment. -I plan to rescan him after cycle 6.  I will see him back in 3 weeks for follow-up.  I will monitor his blood counts in 2 weeks to see if he needs any blood transfusion.  2.  Bilateral leg DVT: -Ultrasound Doppler on 04/18/2019 shows positive bilateral leg DVT. -He will continue Eliquis.  3.  Right hydronephrosis: -Cystoscopy and bilateral  JJ stent placement on 04/21/2019. -He does have occasional hematuria.  4.  Weight loss: -His weight is stable at this time.  5.  Hypophosphatemia: -She will continue Neutra-Phos.  6.  Anxiety: -She will continue Ativan 0.5 mg 1-2 times daily.  7.  TLS prophylaxis: -We will continue allopurinol 300 mg daily.  8.  Hypertension: -She will continue losartan 100 mg daily, Toprol 50 mg daily and Norvasc 10 mg daily.  9.  Normocytic anemia: -Hemoglobin today is 8.9.  He does not require any transfusion. -We will check  his CBC in 2 weeks.       Orders placed this encounter:  No orders of the defined types were placed in this encounter.     Derek Jack, MD [place] [phone]

## 2019-08-02 NOTE — Assessment & Plan Note (Signed)
1.  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 -PET scan on 05/03/2019 shows bulky intensely hypermetabolic peritoneal and retroperitoneal nodal mass, nodularity.  Additional hypermetabolic adenopathy involving the axilla, mediastinum, iliac and inguinal lymph nodes.  Multiple foci of discrete hypermetabolic metastases in the right iliac bone, left iliac crest, left and right scapula, and the C1 vertebral body. 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 -4 cycles of R-EPOCH from 05/11/2019 through 07/12/2019. -I have independently reviewed his PET CT scan and compared with the prior scan.  There is good response in the mesenteric mass and bone lesions.  There is questionable new lesion in the right lower jaw.  I have checked with the patient who has bad dentition in the same area and occasional pain.  There is also questionable right lower lobe lung nodules which will need follow-up. -I do not believe he has any progression on the PET CT scan.  Hence I have recommended proceeding with cycle 5 today.  I have reviewed his labs which are adequate to proceed with treatment. -I plan to rescan him after cycle 6.  I will see him back in 3 weeks for follow-up.  I will monitor his blood counts in 2 weeks to see if he needs any blood transfusion.  2.  Bilateral leg DVT: -Ultrasound Doppler on 04/18/2019 shows positive bilateral leg DVT. -He will continue Eliquis.  3.  Right hydronephrosis: -Cystoscopy and bilateral JJ stent placement on 04/21/2019. -He does have occasional hematuria.  4.  Weight loss: -His weight is stable at this time.  5.  Hypophosphatemia: -She will continue Neutra-Phos.  6.  Anxiety: -She will continue Ativan 0.5 mg 1-2 times daily.  7.  TLS prophylaxis: -We will continue allopurinol 300 mg daily.  8.  Hypertension: -She will continue  losartan 100 mg daily, Toprol 50 mg daily and Norvasc 10 mg daily.  9.  Normocytic anemia: -Hemoglobin today is 8.9.  He does not require any transfusion. -We will check his CBC in 2 weeks.

## 2019-08-03 ENCOUNTER — Inpatient Hospital Stay (HOSPITAL_COMMUNITY): Payer: BLUE CROSS/BLUE SHIELD

## 2019-08-03 ENCOUNTER — Encounter (HOSPITAL_COMMUNITY): Payer: Self-pay

## 2019-08-03 VITALS — BP 142/54 | HR 70 | Temp 97.7°F | Resp 18

## 2019-08-03 DIAGNOSIS — Z5112 Encounter for antineoplastic immunotherapy: Secondary | ICD-10-CM | POA: Diagnosis not present

## 2019-08-03 DIAGNOSIS — Z7189 Other specified counseling: Secondary | ICD-10-CM | POA: Diagnosis not present

## 2019-08-03 DIAGNOSIS — Z95828 Presence of other vascular implants and grafts: Secondary | ICD-10-CM

## 2019-08-03 DIAGNOSIS — Z5111 Encounter for antineoplastic chemotherapy: Secondary | ICD-10-CM | POA: Diagnosis not present

## 2019-08-03 DIAGNOSIS — C8338 Diffuse large B-cell lymphoma, lymph nodes of multiple sites: Secondary | ICD-10-CM | POA: Diagnosis not present

## 2019-08-03 MED ORDER — SODIUM CHLORIDE 0.9 % IV SOLN
Freq: Once | INTRAVENOUS | Status: AC
  Administered 2019-08-03: 13:00:00 8 mg via INTRAVENOUS
  Filled 2019-08-03: qty 4

## 2019-08-03 MED ORDER — VINCRISTINE SULFATE CHEMO INJECTION 1 MG/ML
Freq: Once | INTRAVENOUS | Status: AC
  Filled 2019-08-03: qty 10

## 2019-08-03 MED ORDER — SODIUM CHLORIDE 0.9 % IV SOLN: INTRAVENOUS | Status: DC

## 2019-08-03 MED ORDER — SODIUM CHLORIDE 0.9% FLUSH
10.0000 mL | INTRAVENOUS | Status: DC | PRN
  Administered 2019-08-03: 10 mL via INTRAVENOUS

## 2019-08-03 NOTE — Progress Notes (Signed)
Kevin [name] tolerated chemo tx well without complaints or incident. Pt discharged with ambulatory pump infusing without issues. VSS Pt discharged self ambulatory in satisfactory condition

## 2019-08-03 NOTE — Patient Instructions (Signed)
[facility] Discharge Instructions for Patients Receiving Chemotherapy   Beginning January 23rd 2017 lab work for the [facility] will be done in the  Main lab at [facility] on 1st floor. If you have a lab appointment with the [facility] please come in thru the  Main Entrance and check in at the main information desk   Today you received the following chemotherapy agents Adraimycin,VP-16 and Vincristine. Follow-up as scheduled. Call clinic for any questions or concerns  To help prevent nausea and vomiting after your treatment, we encourage you to take your nausea medication   If you develop nausea and vomiting, or diarrhea that is not controlled by your medication, call the clinic.  The clinic phone number is [phone]. Office hours are Monday-Friday 8:30am-5:00pm.  BELOW ARE SYMPTOMS THAT SHOULD BE REPORTED IMMEDIATELY:  *FEVER GREATER THAN 101.0 F  *CHILLS WITH OR WITHOUT FEVER  NAUSEA AND VOMITING THAT IS NOT CONTROLLED WITH YOUR NAUSEA MEDICATION  *UNUSUAL SHORTNESS OF BREATH  *UNUSUAL BRUISING OR BLEEDING  TENDERNESS IN MOUTH AND THROAT WITH OR WITHOUT PRESENCE OF ULCERS  *URINARY PROBLEMS  *BOWEL PROBLEMS  UNUSUAL RASH Items with * indicate a potential emergency and should be followed up as soon as possible. If you have an emergency after office hours please contact your primary care physician or go to the nearest emergency department.  Please call the clinic during office hours if you have any questions or concerns.   You may also contact the Patient Navigator at [phone] should you have any questions or need assistance in obtaining follow up care.      Resources For Cancer Patients and their Caregivers ? American Cancer Society: Can assist with transportation, wigs, general needs, runs Look Good Feel Better.        ([phone] ? Cancer Care: Provides financial assistance, online support groups, medication/co-pay assistance.   1-800-813-HOPE 703-705-8664) ? [place] Assists [place] cancer patients and their families through emotional , educational and financial support.  302-132-1406 ? [place] DSS Where to apply for food stamps, Medicaid and utility assistance. [phone] ? [facility]: Transportation to medical appointments. [phone] ? Social Security Administration: May apply for disability if have a Stage IV cancer. [phone] ? [employer], Disability and Transit Services: Assists with nutrition, care and transit needs. [phone]

## 2019-08-04 ENCOUNTER — Inpatient Hospital Stay (HOSPITAL_COMMUNITY): Payer: BLUE CROSS/BLUE SHIELD

## 2019-08-04 ENCOUNTER — Other Ambulatory Visit: Payer: Self-pay

## 2019-08-04 ENCOUNTER — Encounter (HOSPITAL_COMMUNITY): Payer: Self-pay

## 2019-08-04 VITALS — BP 138/56 | HR 72 | Temp 97.5°F | Resp 18

## 2019-08-04 DIAGNOSIS — C8338 Diffuse large B-cell lymphoma, lymph nodes of multiple sites: Secondary | ICD-10-CM | POA: Diagnosis not present

## 2019-08-04 DIAGNOSIS — Z5111 Encounter for antineoplastic chemotherapy: Secondary | ICD-10-CM | POA: Diagnosis not present

## 2019-08-04 DIAGNOSIS — Z95828 Presence of other vascular implants and grafts: Secondary | ICD-10-CM

## 2019-08-04 DIAGNOSIS — Z7189 Other specified counseling: Secondary | ICD-10-CM | POA: Diagnosis not present

## 2019-08-04 DIAGNOSIS — Z5112 Encounter for antineoplastic immunotherapy: Secondary | ICD-10-CM | POA: Diagnosis not present

## 2019-08-04 MED ORDER — SODIUM CHLORIDE 0.9 % IV SOLN
Freq: Once | INTRAVENOUS | Status: AC
  Administered 2019-08-04: 8 mg via INTRAVENOUS
  Filled 2019-08-04: qty 4

## 2019-08-04 MED ORDER — SODIUM CHLORIDE 0.9% FLUSH
10.0000 mL | INTRAVENOUS | Status: DC | PRN
  Administered 2019-08-04: 10 mL via INTRAVENOUS

## 2019-08-04 MED ORDER — VINCRISTINE SULFATE CHEMO INJECTION 1 MG/ML
Freq: Once | INTRAVENOUS | Status: AC
  Filled 2019-08-04: qty 10

## 2019-08-04 MED ORDER — SODIUM CHLORIDE 0.9 % IV SOLN: INTRAVENOUS | Status: DC

## 2019-08-04 NOTE — Patient Instructions (Signed)
[facility] Discharge Instructions for Patients Receiving Chemotherapy   Beginning January 23rd 2017 lab work for the [facility] will be done in the  Main lab at [facility] on 1st floor. If you have a lab appointment with the [facility] please come in thru the  Main Entrance and check in at the main information desk   Today you received the following chemotherapy agents Adriamycin,VP-16 and Vincristine ambulatory pump. Follow-up as scheduled. Call clinic for any questions or concerns  To help prevent nausea and vomiting after your treatment, we encourage you to take your nausea medication   If you develop nausea and vomiting, or diarrhea that is not controlled by your medication, call the clinic.  The clinic phone number is [phone]. Office hours are Monday-Friday 8:30am-5:00pm.  BELOW ARE SYMPTOMS THAT SHOULD BE REPORTED IMMEDIATELY:  *FEVER GREATER THAN 101.0 F  *CHILLS WITH OR WITHOUT FEVER  NAUSEA AND VOMITING THAT IS NOT CONTROLLED WITH YOUR NAUSEA MEDICATION  *UNUSUAL SHORTNESS OF BREATH  *UNUSUAL BRUISING OR BLEEDING  TENDERNESS IN MOUTH AND THROAT WITH OR WITHOUT PRESENCE OF ULCERS  *URINARY PROBLEMS  *BOWEL PROBLEMS  UNUSUAL RASH Items with * indicate a potential emergency and should be followed up as soon as possible. If you have an emergency after office hours please contact your primary care physician or go to the nearest emergency department.  Please call the clinic during office hours if you have any questions or concerns.   You may also contact the Patient Navigator at [phone] should you have any questions or need assistance in obtaining follow up care.      Resources For Cancer Patients and their Caregivers ? American Cancer Society: Can assist with transportation, wigs, general needs, runs Look Good Feel Better.        2254823563 ? Cancer Care: Provides financial assistance, online support groups,  medication/co-pay assistance.  1-800-813-HOPE (240) 209-5944) ? [place] Assists [place] cancer patients and their families through emotional , educational and financial support.  4080771117 ? [place] DSS Where to apply for food stamps, Medicaid and utility assistance. 938-249-6938 ? [facility]: Transportation to medical appointments. [phone] ? Social Security Administration: May apply for disability if have a Stage IV cancer. [phone] ? [employer], Disability and Transit Services: Assists with nutrition, care and transit needs. [phone]

## 2019-08-04 NOTE — Progress Notes (Unsigned)
Kevin [name] tolerated ambulatory pump change well without complaints or incident. Pt discharged with ambulatory pump infusing without issues. VSS Pt discharged self ambulatory in satisfactory condition

## 2019-08-05 ENCOUNTER — Inpatient Hospital Stay (HOSPITAL_COMMUNITY): Payer: BLUE CROSS/BLUE SHIELD

## 2019-08-05 VITALS — BP 138/58 | HR 60 | Temp 96.8°F | Resp 18

## 2019-08-05 DIAGNOSIS — Z95828 Presence of other vascular implants and grafts: Secondary | ICD-10-CM

## 2019-08-05 DIAGNOSIS — Z5111 Encounter for antineoplastic chemotherapy: Secondary | ICD-10-CM | POA: Diagnosis not present

## 2019-08-05 DIAGNOSIS — Z7189 Other specified counseling: Secondary | ICD-10-CM | POA: Diagnosis not present

## 2019-08-05 DIAGNOSIS — Z5112 Encounter for antineoplastic immunotherapy: Secondary | ICD-10-CM | POA: Diagnosis not present

## 2019-08-05 DIAGNOSIS — C8338 Diffuse large B-cell lymphoma, lymph nodes of multiple sites: Secondary | ICD-10-CM

## 2019-08-05 MED ORDER — SODIUM CHLORIDE 0.9 % IV SOLN
Freq: Once | INTRAVENOUS | Status: AC
  Administered 2019-08-05: 12:00:00 8 mg via INTRAVENOUS
  Filled 2019-08-05: qty 4

## 2019-08-05 MED ORDER — VINCRISTINE SULFATE CHEMO INJECTION 1 MG/ML
Freq: Once | INTRAVENOUS | Status: AC
  Filled 2019-08-05: qty 10

## 2019-08-05 MED ORDER — SODIUM CHLORIDE 0.9 % IV SOLN: INTRAVENOUS | Status: DC

## 2019-08-05 NOTE — Patient Instructions (Signed)
[facility] Discharge Instructions for Patients Receiving Chemotherapy  Today you received the following chemotherapy agents   To help prevent nausea and vomiting after your treatment, we encourage you to take your nausea medication   If you develop nausea and vomiting that is not controlled by your nausea medication, call the clinic.   BELOW ARE SYMPTOMS THAT SHOULD BE REPORTED IMMEDIATELY:  *FEVER GREATER THAN 100.5 F  *CHILLS WITH OR WITHOUT FEVER  NAUSEA AND VOMITING THAT IS NOT CONTROLLED WITH YOUR NAUSEA MEDICATION  *UNUSUAL SHORTNESS OF BREATH  *UNUSUAL BRUISING OR BLEEDING  TENDERNESS IN MOUTH AND THROAT WITH OR WITHOUT PRESENCE OF ULCERS  *URINARY PROBLEMS  *BOWEL PROBLEMS  UNUSUAL RASH Items with * indicate a potential emergency and should be followed up as soon as possible.  Feel free to call the clinic should you have any questions or concerns. The clinic phone number is [phone].  Please show the CHEMO ALERT CARD at check-in to the Emergency Department and triage nurse.   

## 2019-08-05 NOTE — Progress Notes (Unsigned)
Treatment given per orders. Patient tolerated it well without problems. Vitals stable and discharged home from clinic ambulatory. Follow up as scheduled.  

## 2019-08-06 ENCOUNTER — Inpatient Hospital Stay (HOSPITAL_COMMUNITY): Payer: BLUE CROSS/BLUE SHIELD

## 2019-08-06 ENCOUNTER — Encounter (HOSPITAL_COMMUNITY): Payer: Self-pay

## 2019-08-06 ENCOUNTER — Other Ambulatory Visit: Payer: Self-pay

## 2019-08-06 VITALS — BP 143/56 | HR 56 | Temp 98.7°F | Resp 18

## 2019-08-06 DIAGNOSIS — Z95828 Presence of other vascular implants and grafts: Secondary | ICD-10-CM

## 2019-08-06 DIAGNOSIS — C8338 Diffuse large B-cell lymphoma, lymph nodes of multiple sites: Secondary | ICD-10-CM | POA: Diagnosis not present

## 2019-08-06 DIAGNOSIS — Z5111 Encounter for antineoplastic chemotherapy: Secondary | ICD-10-CM | POA: Diagnosis not present

## 2019-08-06 DIAGNOSIS — Z7189 Other specified counseling: Secondary | ICD-10-CM | POA: Diagnosis not present

## 2019-08-06 DIAGNOSIS — Z5112 Encounter for antineoplastic immunotherapy: Secondary | ICD-10-CM | POA: Diagnosis not present

## 2019-08-06 MED ORDER — SODIUM CHLORIDE 0.9% FLUSH
10.0000 mL | INTRAVENOUS | Status: DC | PRN
  Administered 2019-08-06: 10 mL via INTRAVENOUS

## 2019-08-06 MED ORDER — SODIUM CHLORIDE 0.9 % IV SOLN
Freq: Once | INTRAVENOUS | Status: AC
  Administered 2019-08-06: 16 mg via INTRAVENOUS
  Filled 2019-08-06: qty 8

## 2019-08-06 MED ORDER — HEPARIN SOD (PORK) LOCK FLUSH 100 UNIT/ML IV SOLN
500.0000 [IU] | Freq: Once | INTRAVENOUS | Status: AC
  Administered 2019-08-06: 500 [IU] via INTRAVENOUS

## 2019-08-06 MED ORDER — SODIUM CHLORIDE 0.9 % IV SOLN
750.0000 mg/m2 | Freq: Once | INTRAVENOUS | Status: AC
  Administered 2019-08-06: 1560 mg via INTRAVENOUS
  Filled 2019-08-06: qty 78

## 2019-08-06 MED ORDER — SODIUM CHLORIDE 0.9 % IV SOLN: INTRAVENOUS | Status: DC

## 2019-08-06 NOTE — Patient Instructions (Signed)
[facility] Discharge Instructions for Patients Receiving Chemotherapy   Beginning January 23rd 2017 lab work for the [facility] will be done in the  Main lab at [facility] on 1st floor. If you have a lab appointment with the [facility] please come in thru the  Main Entrance and check in at the main information desk   Today you received the following chemotherapy agents Cytoxan. Follow-up as scheduled. Call clinic for any questions or concerns  To help prevent nausea and vomiting after your treatment, we encourage you to take your nausea medication   If you develop nausea and vomiting, or diarrhea that is not controlled by your medication, call the clinic.  The clinic phone number is [phone]. Office hours are Monday-Friday 8:30am-5:00pm.  BELOW ARE SYMPTOMS THAT SHOULD BE REPORTED IMMEDIATELY:  *FEVER GREATER THAN 101.0 F  *CHILLS WITH OR WITHOUT FEVER  NAUSEA AND VOMITING THAT IS NOT CONTROLLED WITH YOUR NAUSEA MEDICATION  *UNUSUAL SHORTNESS OF BREATH  *UNUSUAL BRUISING OR BLEEDING  TENDERNESS IN MOUTH AND THROAT WITH OR WITHOUT PRESENCE OF ULCERS  *URINARY PROBLEMS  *BOWEL PROBLEMS  UNUSUAL RASH Items with * indicate a potential emergency and should be followed up as soon as possible. If you have an emergency after office hours please contact your primary care physician or go to the nearest emergency department.  Please call the clinic during office hours if you have any questions or concerns.   You may also contact the Patient Navigator at [phone] should you have any questions or need assistance in obtaining follow up care.      Resources For Cancer Patients and their Caregivers ? American Cancer Society: Can assist with transportation, wigs, general needs, runs Look Good Feel Better.        ([phone] 331-2185 ? Cancer Care: Provides financial assistance, online support groups, medication/co-pay assistance.  1-800-813-HOPE  605-117-8946) ? [place] Assists [place] cancer patients and their families through emotional , educational and financial support.  661-648-4477 ? [place] DSS Where to apply for food stamps, Medicaid and utility assistance. 410-859-4495 ? [employer]: Transportation to medical appointments. (443)057-4042 ? Social Security Administration: May apply for disability if have a Stage IV cancer. 331-196-3611 316-803-3571 ? [employer], Disability and Transit Services: Assists with nutrition, care and transit needs. 509-643-3668

## 2019-08-06 NOTE — Progress Notes (Signed)
[name] tolerated ambulatory pump discontinuation and Cytoxan infusion well without complaints or incident. VSS upon discharge. Pt discharged self ambulatory in satisfactory condition

## 2019-08-07 DIAGNOSIS — I272 Pulmonary hypertension, unspecified: Secondary | ICD-10-CM | POA: Diagnosis not present

## 2019-08-07 DIAGNOSIS — J9601 Acute respiratory failure with hypoxia: Secondary | ICD-10-CM | POA: Diagnosis not present

## 2019-08-09 ENCOUNTER — Other Ambulatory Visit: Payer: Self-pay

## 2019-08-09 ENCOUNTER — Inpatient Hospital Stay (HOSPITAL_COMMUNITY): Payer: BLUE CROSS/BLUE SHIELD

## 2019-08-09 ENCOUNTER — Encounter (HOSPITAL_COMMUNITY): Payer: Self-pay

## 2019-08-09 VITALS — BP 131/57 | HR 67 | Temp 97.5°F | Resp 18 | Wt 186.2 lb

## 2019-08-09 DIAGNOSIS — C8338 Diffuse large B-cell lymphoma, lymph nodes of multiple sites: Secondary | ICD-10-CM | POA: Diagnosis not present

## 2019-08-09 DIAGNOSIS — Z7189 Other specified counseling: Secondary | ICD-10-CM | POA: Diagnosis not present

## 2019-08-09 DIAGNOSIS — Z5111 Encounter for antineoplastic chemotherapy: Secondary | ICD-10-CM | POA: Diagnosis not present

## 2019-08-09 DIAGNOSIS — Z95828 Presence of other vascular implants and grafts: Secondary | ICD-10-CM

## 2019-08-09 DIAGNOSIS — Z5112 Encounter for antineoplastic immunotherapy: Secondary | ICD-10-CM | POA: Diagnosis not present

## 2019-08-09 MED ORDER — PEGFILGRASTIM-JMDB 6 MG/0.6ML ~~LOC~~ SOSY
6.0000 mg | PREFILLED_SYRINGE | Freq: Once | SUBCUTANEOUS | Status: AC
  Administered 2019-08-09: 10:00:00 6 mg via SUBCUTANEOUS

## 2019-08-09 MED ORDER — PEGFILGRASTIM-JMDB 6 MG/0.6ML ~~LOC~~ SOSY
PREFILLED_SYRINGE | SUBCUTANEOUS | Status: AC
  Filled 2019-08-09: qty 0.6

## 2019-08-09 NOTE — Progress Notes (Signed)
[name] tolerated Fulphila injection well without complaints or incident. VSS Pt discharged self ambulatory in satisfactory condition

## 2019-08-09 NOTE — Patient Instructions (Signed)
[facility] at [facility] Discharge Instructions  Received Fulphila injection today. Follow-up as scheduled. Call clinic for any questions or concerns   Thank you for choosing [facility] at [facility] to provide your oncology and hematology care.  To afford each patient quality time with our provider, please arrive at least 15 minutes before your scheduled appointment time.   If you have a lab appointment with the Cancer Center please come in thru the Main Entrance and check in at the main information desk.  You need to re-schedule your appointment should you arrive 10 or more minutes late.  We strive to give you quality time with our providers, and arriving late affects you and other patients whose appointments are after yours.  Also, if you no show three or more times for appointments you may be dismissed from the clinic at the providers discretion.     Again, thank you for choosing [facility].  Our hope is that these requests will decrease the amount of time that you wait before being seen by our physicians.       _____________________________________________________________  Should you have questions after your visit to [facility], please contact our office at [phone] between the hours of 8:00 a.m. and 4:30 p.m.  Voicemails left after 4:00 p.m. will not be returned until the following business day.  For prescription refill requests, have your pharmacy contact our office and allow 72 hours.    Due to Covid, you will need to wear a mask upon entering the hospital. If you do not have a mask, a mask will be given to you at the Main Entrance upon arrival. For doctor visits, patients may have 1 support person with them. For treatment visits, patients can not have anyone with them due to social distancing guidelines and our immunocompromised population.     

## 2019-08-10 DIAGNOSIS — C8338 Diffuse large B-cell lymphoma, lymph nodes of multiple sites: Secondary | ICD-10-CM | POA: Diagnosis not present

## 2019-08-13 ENCOUNTER — Other Ambulatory Visit (HOSPITAL_COMMUNITY): Payer: Self-pay | Admitting: Nurse Practitioner

## 2019-08-13 DIAGNOSIS — C8338 Diffuse large B-cell lymphoma, lymph nodes of multiple sites: Secondary | ICD-10-CM

## 2019-08-13 DIAGNOSIS — Z95828 Presence of other vascular implants and grafts: Secondary | ICD-10-CM

## 2019-08-16 ENCOUNTER — Inpatient Hospital Stay (HOSPITAL_COMMUNITY): Payer: BLUE CROSS/BLUE SHIELD

## 2019-08-16 ENCOUNTER — Other Ambulatory Visit: Payer: Self-pay

## 2019-08-16 ENCOUNTER — Encounter (HOSPITAL_COMMUNITY): Payer: Self-pay

## 2019-08-16 DIAGNOSIS — C8338 Diffuse large B-cell lymphoma, lymph nodes of multiple sites: Secondary | ICD-10-CM | POA: Diagnosis not present

## 2019-08-16 DIAGNOSIS — Z5112 Encounter for antineoplastic immunotherapy: Secondary | ICD-10-CM | POA: Diagnosis not present

## 2019-08-16 DIAGNOSIS — Z7189 Other specified counseling: Secondary | ICD-10-CM | POA: Diagnosis not present

## 2019-08-16 DIAGNOSIS — Z5111 Encounter for antineoplastic chemotherapy: Secondary | ICD-10-CM | POA: Diagnosis not present

## 2019-08-16 LAB — COMPREHENSIVE METABOLIC PANEL
ALT: 15 U/L (ref 0–44)
AST: 17 U/L (ref 15–41)
Albumin: 3.1 g/dL — ABNORMAL LOW (ref 3.5–5.0)
Alkaline Phosphatase: 77 U/L (ref 38–126)
Anion gap: 9 (ref 5–15)
BUN: 12 mg/dL (ref 8–23)
CO2: 30 mmol/L (ref 22–32)
Calcium: 9 mg/dL (ref 8.9–10.3)
Chloride: 102 mmol/L (ref 98–111)
Creatinine, Ser: 0.62 mg/dL (ref 0.61–1.24)
GFR calc Af Amer: >60 mL/min (ref >60)
GFR calc non Af Amer: >60 mL/min (ref >60)
Glucose, Bld: 118 mg/dL — ABNORMAL HIGH (ref 70–99)
Potassium: 3.9 mmol/L (ref 3.5–5.1)
Sodium: 141 mmol/L (ref 135–145)
Total Bilirubin: 0.3 mg/dL (ref 0.3–1.2)
Total Protein: 5.7 g/dL — ABNORMAL LOW (ref 6.5–8.1)

## 2019-08-16 LAB — MAGNESIUM: Magnesium: 1.7 mg/dL (ref 1.7–2.4)

## 2019-08-16 LAB — CBC WITH DIFFERENTIAL/PLATELET
Abs Immature Granulocytes: 3.29 10*3/uL — ABNORMAL HIGH (ref 0.00–0.07)
Basophils Absolute: 0.1 10*3/uL (ref 0.0–0.1)
Basophils Relative: 1 %
Eosinophils Absolute: 0.3 10*3/uL (ref 0.0–0.5)
Eosinophils Relative: 2 %
HCT: 27.3 % — ABNORMAL LOW (ref 39.0–52.0)
Hemoglobin: 7.9 g/dL — ABNORMAL LOW (ref 13.0–17.0)
Immature Granulocytes: 24 %
Lymphocytes Relative: 7 %
Lymphs Abs: 1 10*3/uL (ref 0.7–4.0)
MCH: 26.3 pg (ref 26.0–34.0)
MCHC: 28.9 g/dL — ABNORMAL LOW (ref 30.0–36.0)
MCV: 91 fL (ref 80.0–100.0)
Monocytes Absolute: 2.1 10*3/uL — ABNORMAL HIGH (ref 0.1–1.0)
Monocytes Relative: 15 %
Neutro Abs: 7 10*3/uL (ref 1.7–7.7)
Neutrophils Relative %: 51 %
Platelets: 134 10*3/uL — ABNORMAL LOW (ref 150–400)
RBC: 3 MIL/uL — ABNORMAL LOW (ref 4.22–5.81)
RDW: 18.5 % — ABNORMAL HIGH (ref 11.5–15.5)
WBC: 13.8 10*3/uL — ABNORMAL HIGH (ref 4.0–10.5)
nRBC: 0.9 % — ABNORMAL HIGH (ref 0.0–0.2)

## 2019-08-16 LAB — URIC ACID: Uric Acid, Serum: 3.7 mg/dL (ref 3.7–8.6)

## 2019-08-16 LAB — PHOSPHORUS: Phosphorus: 4.1 mg/dL (ref 2.5–4.6)

## 2019-08-16 MED ORDER — HYDROCODONE-ACETAMINOPHEN 5-325 MG PO TABS: 1.0000 | ORAL_TABLET | Freq: Three times a day (TID) | ORAL | 0 refills | Status: DC | PRN

## 2019-08-16 MED ORDER — FUROSEMIDE 20 MG PO TABS: 20.0000 mg | ORAL_TABLET | Freq: Every day | ORAL | 2 refills | Status: DC | PRN

## 2019-08-16 NOTE — Progress Notes (Signed)
Patient presents today for possible blood products and lab work. MAR reviewed. Patient requesting a pain medication refill. Daughter at the bedside and states that his legs are swelling. Upon assessment, Left ankle 1+ edema. NO pitting noted. Patient denies any pain today. Patient has complaints of a cough associated with sinus drainage.   Labs reviewed with Dr. Delton Coombes. Message received from [facility] LPN/ Dr. Delton Coombes patient does not need blood products today. HGB 7.9 today. Per [facility] LPN/ Dr. Delton Coombes script sent to pharmacy for 20mg  of Lasix daily as needed for ankle swelling. Pain medication refill will be sent today per [facility] LPN/ Dr. Delton Coombes.   No complaints at this time. Discharged from clinic ambulatory. F/U with [facility] as scheduled.

## 2019-08-18 ENCOUNTER — Other Ambulatory Visit (HOSPITAL_COMMUNITY): Payer: Self-pay | Admitting: Nurse Practitioner

## 2019-08-21 ENCOUNTER — Other Ambulatory Visit: Payer: Self-pay | Admitting: Urology

## 2019-08-23 ENCOUNTER — Encounter (HOSPITAL_COMMUNITY): Payer: Self-pay | Admitting: Hematology

## 2019-08-23 ENCOUNTER — Inpatient Hospital Stay (HOSPITAL_BASED_OUTPATIENT_CLINIC_OR_DEPARTMENT_OTHER): Payer: BLUE CROSS/BLUE SHIELD | Admitting: Hematology

## 2019-08-23 ENCOUNTER — Inpatient Hospital Stay (HOSPITAL_COMMUNITY): Payer: BLUE CROSS/BLUE SHIELD

## 2019-08-23 ENCOUNTER — Encounter (HOSPITAL_COMMUNITY): Payer: Self-pay

## 2019-08-23 ENCOUNTER — Other Ambulatory Visit: Payer: Self-pay

## 2019-08-23 VITALS — BP 126/61 | HR 69 | Temp 96.9°F | Resp 16

## 2019-08-23 DIAGNOSIS — C8338 Diffuse large B-cell lymphoma, lymph nodes of multiple sites: Secondary | ICD-10-CM

## 2019-08-23 DIAGNOSIS — Z7189 Other specified counseling: Secondary | ICD-10-CM | POA: Diagnosis not present

## 2019-08-23 DIAGNOSIS — R300 Dysuria: Secondary | ICD-10-CM

## 2019-08-23 DIAGNOSIS — Z5111 Encounter for antineoplastic chemotherapy: Secondary | ICD-10-CM | POA: Diagnosis not present

## 2019-08-23 DIAGNOSIS — Z95828 Presence of other vascular implants and grafts: Secondary | ICD-10-CM

## 2019-08-23 DIAGNOSIS — Z5112 Encounter for antineoplastic immunotherapy: Secondary | ICD-10-CM | POA: Diagnosis not present

## 2019-08-23 LAB — CBC WITH DIFFERENTIAL/PLATELET
Abs Immature Granulocytes: 0.74 10*3/uL — ABNORMAL HIGH (ref 0.00–0.07)
Basophils Absolute: 0.1 10*3/uL (ref 0.0–0.1)
Basophils Relative: 1 %
Eosinophils Absolute: 0.2 10*3/uL (ref 0.0–0.5)
Eosinophils Relative: 1 %
HCT: 28.4 % — ABNORMAL LOW (ref 39.0–52.0)
Hemoglobin: 8.3 g/dL — ABNORMAL LOW (ref 13.0–17.0)
Immature Granulocytes: 6 %
Lymphocytes Relative: 11 %
Lymphs Abs: 1.4 10*3/uL (ref 0.7–4.0)
MCH: 26.1 pg (ref 26.0–34.0)
MCHC: 29.2 g/dL — ABNORMAL LOW (ref 30.0–36.0)
MCV: 89.3 fL (ref 80.0–100.0)
Monocytes Absolute: 1 10*3/uL (ref 0.1–1.0)
Monocytes Relative: 8 %
Neutro Abs: 9.3 10*3/uL — ABNORMAL HIGH (ref 1.7–7.7)
Neutrophils Relative %: 73 %
Platelets: 325 10*3/uL (ref 150–400)
RBC: 3.18 MIL/uL — ABNORMAL LOW (ref 4.22–5.81)
RDW: 17.6 % — ABNORMAL HIGH (ref 11.5–15.5)
WBC: 12.7 10*3/uL — ABNORMAL HIGH (ref 4.0–10.5)
nRBC: 0.2 % (ref 0.0–0.2)

## 2019-08-23 LAB — COMPREHENSIVE METABOLIC PANEL
ALT: 10 U/L (ref 0–44)
AST: 13 U/L — ABNORMAL LOW (ref 15–41)
Albumin: 3 g/dL — ABNORMAL LOW (ref 3.5–5.0)
Alkaline Phosphatase: 68 U/L (ref 38–126)
Anion gap: 8 (ref 5–15)
BUN: 11 mg/dL (ref 8–23)
CO2: 29 mmol/L (ref 22–32)
Calcium: 9.1 mg/dL (ref 8.9–10.3)
Chloride: 104 mmol/L (ref 98–111)
Creatinine, Ser: 0.61 mg/dL (ref 0.61–1.24)
GFR calc Af Amer: >60 mL/min (ref >60)
GFR calc non Af Amer: >60 mL/min (ref >60)
Glucose, Bld: 125 mg/dL — ABNORMAL HIGH (ref 70–99)
Potassium: 3.6 mmol/L (ref 3.5–5.1)
Sodium: 141 mmol/L (ref 135–145)
Total Bilirubin: 0.4 mg/dL (ref 0.3–1.2)
Total Protein: 5.6 g/dL — ABNORMAL LOW (ref 6.5–8.1)

## 2019-08-23 LAB — URINALYSIS, COMPLETE (UACMP) WITH MICROSCOPIC
Bacteria, UA: NONE SEEN
Bilirubin Urine: NEGATIVE
Glucose, UA: NEGATIVE mg/dL
Ketones, ur: NEGATIVE mg/dL
Leukocytes,Ua: NEGATIVE
Nitrite: POSITIVE — AB
Protein, ur: 100 mg/dL — AB
RBC / HPF: >50 RBC/hpf — ABNORMAL HIGH (ref 0–5)
Specific Gravity, Urine: 1.012 (ref 1.005–1.030)
WBC, UA: >50 WBC/hpf — ABNORMAL HIGH (ref 0–5)
pH: 6 (ref 5.0–8.0)

## 2019-08-23 LAB — MAGNESIUM: Magnesium: 1.7 mg/dL (ref 1.7–2.4)

## 2019-08-23 LAB — PHOSPHORUS: Phosphorus: 3.9 mg/dL (ref 2.5–4.6)

## 2019-08-23 LAB — URIC ACID: Uric Acid, Serum: 3.5 mg/dL — ABNORMAL LOW (ref 3.7–8.6)

## 2019-08-23 MED ORDER — VINCRISTINE SULFATE CHEMO INJECTION 1 MG/ML
Freq: Once | INTRAVENOUS | Status: AC
  Filled 2019-08-23: qty 10

## 2019-08-23 MED ORDER — SODIUM CHLORIDE 0.9% FLUSH
10.0000 mL | INTRAVENOUS | Status: DC | PRN
  Administered 2019-08-23: 08:00:00 10 mL

## 2019-08-23 MED ORDER — ACETAMINOPHEN 325 MG PO TABS
650.0000 mg | ORAL_TABLET | Freq: Once | ORAL | Status: AC
  Administered 2019-08-23: 10:00:00 650 mg via ORAL
  Filled 2019-08-23: qty 2

## 2019-08-23 MED ORDER — FAMOTIDINE IN NACL 20-0.9 MG/50ML-% IV SOLN
20.0000 mg | Freq: Once | INTRAVENOUS | Status: AC
  Administered 2019-08-23: 10:00:00 20 mg via INTRAVENOUS
  Filled 2019-08-23: qty 50

## 2019-08-23 MED ORDER — SODIUM CHLORIDE 0.9 % IV SOLN
Freq: Once | INTRAVENOUS | Status: AC
  Administered 2019-08-23: 10:00:00 8 mg via INTRAVENOUS
  Filled 2019-08-23: qty 4

## 2019-08-23 MED ORDER — DIPHENHYDRAMINE HCL 50 MG/ML IJ SOLN
50.0000 mg | Freq: Once | INTRAMUSCULAR | Status: AC
  Administered 2019-08-23: 10:00:00 50 mg via INTRAVENOUS
  Filled 2019-08-23: qty 1

## 2019-08-23 MED ORDER — SODIUM CHLORIDE 0.9 % IV SOLN
375.0000 mg/m2 | Freq: Once | INTRAVENOUS | Status: AC
  Administered 2019-08-23: 11:00:00 800 mg via INTRAVENOUS
  Filled 2019-08-23: qty 30

## 2019-08-23 MED ORDER — SODIUM CHLORIDE 0.9 % IV SOLN: INTRAVENOUS | Status: DC

## 2019-08-23 MED ORDER — CIPROFLOXACIN HCL 500 MG PO TABS: 500.0000 mg | ORAL_TABLET | Freq: Two times a day (BID) | ORAL | 0 refills | Status: DC

## 2019-08-23 MED ORDER — HEPARIN SOD (PORK) LOCK FLUSH 100 UNIT/ML IV SOLN: 500.0000 [IU] | Freq: Once | INTRAVENOUS | Status: DC | PRN

## 2019-08-23 NOTE — Assessment & Plan Note (Addendum)
1.  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 -PET scan on 05/03/2019 shows bulky intensely hypermetabolic peritoneal and retroperitoneal nodal mass, nodularity.  Additional hypermetabolic adenopathy in the axilla, mediastinum, iliac and inguinal lymph nodes.  Multiple foci of discrete hypermetabolic metastatic disease in the right iliac bone, left iliac crest, left and right scapula, and C1 vertebral body. -5 cycles of R-EPOCH from 05/11/2019 through 08/02/2019. -PET scan on 07/20/2019 showed good response in the mesenteric mass and bone lesions.  There is questionable new lesion in the right lower jaw which I think is from bad dentition in the same area and occasional pain.  There is questionable right lower lobe lung nodule which needs further follow-up. -We reviewed his CBC and LFTs which are acceptable for his next treatment. -He will proceed with his cycle 6 today.  I plan to see him back in 3 to 4 weeks with repeat PET scan. -If he gets a complete response, he might be a candidate for maintenance rituximab as he has follicular lymphoma competent.  If not I will refer him to lymphoma specialist. -He had hematuria today.  He denies any dysuria.  We will send urine for culture.  I will start him on Cipro 500 twice daily for 7 days.  2.  Bilateral leg DVT: -Ultrasound Doppler on 04/18/2019 shows bilateral leg DVT.  He is on Eliquis.  3.  Right hydronephrosis: -Cystoscopy and bilateral JJ stent placement on 04/21/2019.  He does have occasional hematuria.  4.  Hypophosphatemia: -He will continue Neutra-Phos.  5.  Anxiety: -He will continue Ativan 0.5 mg 1-2 times daily.  6.  TLS prophylaxis: -He will continue allopurinol 300 mg daily.  7.  Hypertension: -He will continue losartan 100 mg daily, Toprol 50 mg  daily and Norvasc 10 mg daily.  Blood pressure today is well controlled.  8.  Normocytic anemia: -He is receiving intermittent transfusions.  Last transfusion was on 07/23/2019.

## 2019-08-23 NOTE — Patient Instructions (Addendum)
[place] at [facility] Discharge Instructions  You were seen today by Dr. Delton Coombes. He went over your recent lab results. He will repeat your PET scan prior to your next visit. He will send in a new prescription for CIPRO to be taken twice a day for 7 days for your urine. He will see you back in 4 weeks for labs, treatment and follow up.   Thank you for choosing [place] at [facility] to provide your oncology and hematology care.  To afford each patient quality time with our provider, please arrive at least 15 minutes before your scheduled appointment time.   If you have a lab appointment with the [place] please come in thru the  Main Entrance and check in at the main information desk  You need to re-schedule your appointment should you arrive 10 or more minutes late.  We strive to give you quality time with our providers, and arriving late affects you and other patients whose appointments are after yours.  Also, if you no show three or more times for appointments you may be dismissed from the clinic at the providers discretion.     Again, thank you for choosing [facility].  Our hope is that these requests will decrease the amount of time that you wait before being seen by our physicians.       _____________________________________________________________  Should you have questions after your visit to [facility], please contact our office at [phone] between the hours of 8:00 a.m. and 4:30 p.m.  Voicemails left after 4:00 p.m. will not be returned until the following business day.  For prescription refill requests, have your pharmacy contact our office and allow 72 hours.    Cancer Center Support Programs:   > Cancer Support Group  2nd Tuesday of the month 1pm-2pm, Journey Room

## 2019-08-23 NOTE — Progress Notes (Signed)
Patient has been assessed, vital signs and labs have been reviewed by Dr. Katragadda. ANC, Creatinine, LFTs, and Platelets are within treatment parameters per Dr. Katragadda. The patient is good to proceed with treatment at this time.  

## 2019-08-23 NOTE — Progress Notes (Signed)
To treatment room for oncology follow up and chemotherapy.  Patient stated he feels congested in his chest with non-productive cough.  Denied fevers and chills and shortness of breath.  Stated prn burning with urine.  Urine collected and reddish-orange in color.  Urine sample shown to Dr. Delton Coombes.  No changes in finger or toe neuropathy.  No s/s of distress noted.   Patient stated he took his prednisone at home with food this morning.    Ok to treat today verbal order Dr. Delton Coombes.   Patient tolerated chemotherapy with no complaints voiced.  Port site clean and dry with no bruising or swelling noted at site.  Good blood return noted before and after administration of chemotherapy.  chemotherapy pump connected with no alarms noted.  Dressing intact.  Patient left ambulatory with VSS and no s/s of distress noted.

## 2019-08-23 NOTE — Progress Notes (Signed)
[address], [place] [address]   CLINIC:  Medical Oncology/Hematology  PCP:  Kevin [name], Kevin [name] [id number] [phone]   REASON FOR VISIT:  Follow-up for high-grade lymphoma.  CURRENT THERAPY: Dose adjusted R-EPOCH  BRIEF ONCOLOGIC HISTORY:  Oncology History  Diffuse large B-cell lymphoma of lymph nodes of multiple regions ([place])  04/26/2019 Initial Diagnosis   Diffuse large B-cell lymphoma of lymph nodes of multiple regions ([place])   05/10/2019 -  Chemotherapy   The patient had pegfilgrastim (NEULASTA ONPRO KIT) injection 6 mg, 6 mg, Subcutaneous, Once, 1 of 1 cycle pegfilgrastim-jmdb (FULPHILA) injection 6 mg, 6 mg, Subcutaneous,  Once, 6 of 6 cycles Administration: 6 mg (05/18/2019), 6 mg (06/07/2019), 6 mg (06/26/2019), 6 mg (07/19/2019), 6 mg (08/09/2019) DOXOrubicin (ADRIAMYCIN) 20 mg, etoposide (VEPESID) 104 mg, vinCRIStine (ONCOVIN) 0.8 mg in sodium chloride 0.9 % 500 mL chemo infusion, , Intravenous, Once, 6 of 6 cycles Administration:  (05/11/2019),  (05/31/2019),  (06/01/2019),  (05/12/2019),  (05/13/2019),  (05/14/2019),  (06/02/2019),  (06/03/2019),  (06/20/2019),  (06/21/2019),  (06/22/2019),  (07/12/2019),  (06/23/2019),  (07/13/2019),  (07/14/2019),  (07/15/2019),  (08/02/2019),  (08/03/2019),  (08/04/2019),  (08/05/2019),  (08/23/2019) ondansetron (ZOFRAN) 8 mg in sodium chloride 0.9 % 50 mL IVPB, , Intravenous,  Once, 6 of 6 cycles Administration: 8 mg (05/10/2019), 8 mg (05/11/2019), 16 mg (05/17/2019), 8 mg (06/01/2019), 16 mg (06/04/2019), 8 mg (05/12/2019), 8 mg (05/13/2019), 8 mg (05/14/2019), 8 mg (06/02/2019), 8 mg (06/03/2019), 8 mg (06/20/2019), 16 mg (06/24/2019), 8 mg (06/22/2019), 8 mg (07/12/2019), 8 mg (06/23/2019), 8 mg (07/13/2019), 16 mg (07/16/2019), 8 mg (07/14/2019), 8 mg (07/15/2019), 8 mg (08/02/2019), 8 mg (08/03/2019), 8 mg (08/04/2019), 8 mg (08/05/2019), 16 mg (08/06/2019), 8 mg (08/23/2019) cyclophosphamide (CYTOXAN)  1,560 mg in sodium chloride 0.9 % 250 mL chemo infusion, 750 mg/m2 = 1,560 mg, Intravenous,  Once, 6 of 6 cycles Administration: 1,560 mg (05/17/2019), 1,560 mg (06/04/2019), 1,560 mg (06/24/2019), 1,560 mg (07/16/2019), 1,560 mg (08/06/2019) riTUXimab-pvvr (RUXIENCE) 800 mg in sodium chloride 0.9 % 250 mL (2.4242 mg/mL) infusion, 375 mg/m2 = 800 mg, Intravenous,  Once, 6 of 6 cycles Dose modification: 100 mg (original dose 375 mg/m2, Cycle 1, Reason: Other (see comments), Comment: test dose), 600 mg (original dose 375 mg/m2, Cycle 1, Reason: Other (see comments), Comment: due to previous reaction giving test dose first), 400 mg (original dose 375 mg/m2, Cycle 2, Reason: Other (see comments), Comment: remainder of day before fluid - do not bill) Administration: 800 mg (05/10/2019), 800 mg (05/31/2019), 100 mg (05/17/2019), 400 mg (06/01/2019), 800 mg (06/21/2019), 800 mg (07/12/2019), 800 mg (08/02/2019), 800 mg (08/23/2019)  for chemotherapy treatment.    05/11/2019 - 05/11/2019 Chemotherapy   The patient had DOXOrubicin (ADRIAMYCIN) chemo injection 104 mg, 50 mg/m2, Intravenous,  Once, 0 of 6 cycles palonosetron (ALOXI) injection 0.25 mg, 0.25 mg, Intravenous,  Once, 0 of 6 cycles pegfilgrastim-jmdb (FULPHILA) injection 6 mg, 6 mg, Subcutaneous,  Once, 0 of 6 cycles vinCRIStine (ONCOVIN) 2 mg in sodium chloride 0.9 % 50 mL chemo infusion, 2 mg, Intravenous,  Once, 0 of 6 cycles cyclophosphamide (CYTOXAN) 1,540 mg in sodium chloride 0.9 % 250 mL chemo infusion, 750 mg/m2, Intravenous,  Once, 0 of 6 cycles fosaprepitant (EMEND) 150 mg, dexamethasone (DECADRON) 12 mg in sodium chloride 0.9 % 145 mL IVPB, , Intravenous,  Once, 0 of 6 cycles riTUXimab-pvvr (RUXIENCE) 800 mg in sodium chloride 0.9 % 250 mL (2.4242  mg/mL) infusion, 375 mg/m2, Intravenous,  Once, 0 of 6 cycles  for chemotherapy treatment.       CANCER STAGING: Cancer Staging No matching staging information was found for the patient.    INTERVAL HISTORY:  [name] 64 y.o. male seen for follow-up and toxicity assessment prior to cycle 6 of chemotherapy for double hit lymphoma.  Reported occasional dry cough.  Did not experience any major side effects after last chemotherapy.  He has on and off diarrhea.  He had very minor burning after urination.  However he is taking medication for the burning pain.  REVIEW OF SYSTEMS:  Review of Systems  Respiratory: Positive for cough.   Gastrointestinal: Positive for diarrhea.  Genitourinary: Positive for hematuria.   All other systems reviewed and are negative.    PAST MEDICAL/SURGICAL HISTORY:  Past Medical History:  Diagnosis Date  . Diffuse large B cell lymphoma ([place]) 04/26/2019  . Hyperlipidemia 02/01/2014  . Hypertension   . Leg DVT (deep venous thromboembolism), acute, bilateral ([place]) 04/18/2019  . Port-A-Cath in place 04/27/2019   Past Surgical History:  Procedure Laterality Date  . CYSTOSCOPY W/ URETERAL STENT PLACEMENT Bilateral 04/21/2019   Procedure: CYSTOSCOPY WITH RETROGRADE PYELOGRAM/URETERAL STENT PLACEMENT;  Surgeon: Cleon Gustin, MD;  Location: [facility];  Service: Urology;  Laterality: Bilateral;  . INGUINAL LYMPH NODE BIOPSY Right 04/21/2019   Procedure: INGUINAL LYMPH NODE BIOPSY;  Surgeon: Virl Cagey, MD;  Location: [facility];  Service: General;  Laterality: Right;  . PORTACATH PLACEMENT Left 05/07/2019   Procedure: INSERTION PORT-A-CATH (catheter left subclavian);  Surgeon: Virl Cagey, MD;  Location: [facility];  Service: General;  Laterality: Left;     SOCIAL HISTORY:  Social History   Socioeconomic History  . Marital status: Married    Spouse name: Not on file  . Number of children: Not on file  . Years of education: Not on file  . Highest education level: Not on file  Occupational History  . Not on file  Tobacco Use  . Smoking status: Former Smoker    Quit date: 04/27/1999    Years since quitting: 20.3  . Smokeless tobacco:  Never Used  Substance and Sexual Activity  . Alcohol use: Yes    Alcohol/week: 56.0 standard drinks    Types: 56 Cans of beer per week  . Drug use: Never  . Sexual activity: Not on file  Other Topics Concern  . Not on file  Social History Narrative  . Not on file   Social Determinants of Health   Financial Resource Strain:   . Difficulty of Paying Living Expenses: Not on file  Food Insecurity:   . Worried About Charity fundraiser in the Last Year: Not on file  . Ran Out of Food in the Last Year: Not on file  Transportation Needs:   . Lack of Transportation (Medical): Not on file  . Lack of Transportation (Non-Medical): Not on file  Physical Activity:   . Days of Exercise per Week: Not on file  . Minutes of Exercise per Session: Not on file  Stress:   . Feeling of Stress : Not on file  Social Connections:   . Frequency of Communication with Friends and Family: Not on file  . Frequency of Social Gatherings with Friends and Family: Not on file  . Attends Religious Services: Not on file  . Active Member of Clubs or Organizations: Not on file  . Attends Archivist Meetings: Not on file  .  Marital Status: Not on file  Intimate Partner Violence:   . Fear of Current or Ex-Partner: Not on file  . Emotionally Abused: Not on file  . Physically Abused: Not on file  . Sexually Abused: Not on file    FAMILY HISTORY:  Family History  Problem Relation Age of Onset  . Diabetes Brother   . Diabetes Mother   . Leukemia Brother   . Diabetes Son     CURRENT MEDICATIONS:  Outpatient Encounter Medications as of 08/23/2019  Medication Sig  . allopurinol (ZYLOPRIM) 300 MG tablet TAKE 1 TABLET BY MOUTH EVERY DAY  . amLODipine (NORVASC) 10 MG tablet Take 1 tablet (10 mg total) by mouth daily.  Marland Kitchen apixaban (ELIQUIS) 5 MG TABS tablet Take 1 tablet (5 mg total) by mouth 2 (two) times daily.  . ciprofloxacin (CIPRO) 500 MG tablet Take 1 tablet (500 mg total) by mouth 2 (two) times  daily.  . CYCLOPHOSPHAMIDE IV Inject into the vein every 21 ( twenty-one) days.  Marland Kitchen docusate sodium (COLACE) 100 MG capsule Take 100 mg by mouth 2 (two) times daily.  Marland Kitchen DOXORUBICIN HCL IV Inject into the vein every 21 ( twenty-one) days.  . ETOPOSIDE IV Inject into the vein.  . furosemide (LASIX) 20 MG tablet Take 1 tablet (20 mg total) by mouth daily as needed.  Marland Kitchen HYDROcodone-acetaminophen (NORCO/VICODIN) 5-325 MG tablet Take 1 tablet by mouth every 8 (eight) hours as needed for moderate pain.  . K Phos Mono-Sod Phos Di & Mono (PHOSPHA 250 NEUTRAL) 155-852-130 MG TABS TAKE 1 TABLET BY MOUTH THREE TIMES A DAY  . loratadine (CLARITIN) 10 MG tablet Take 10 mg by mouth daily.  Marland Kitchen LORazepam (ATIVAN) 0.5 MG tablet Take 1 tablet (0.5 mg total) by mouth every 8 (eight) hours.  Marland Kitchen losartan (COZAAR) 100 MG tablet Take 1 tablet (100 mg total) by mouth daily.  . magic mouthwash SOLN Swish 14m for 5 minutes then spit out.  . metoprolol succinate (TOPROL-XL) 50 MG 24 hr tablet Take 1 tablet (50 mg total) by mouth daily.  . mupirocin ointment (BACTROBAN) 2 % Place 1 application into the nose 2 (two) times daily.  . phenazopyridine (PYRIDIUM) 100 MG tablet TAKE 1 TABLET (100 MG TOTAL) BY MOUTH 3 (THREE) TIMES DAILY AS NEEDED FOR PAIN.  .Marland KitchenpredniSONE (DELTASONE) 20 MG tablet TAKE 3 TABLETS (60 MG TOTAL) BY MOUTH DAILY. TAKE ON DAYS 1-5 OF CHEMOTHERAPY.  .Marland Kitchenprochlorperazine (COMPAZINE) 10 MG tablet Take 10 mg by mouth every 6 (six) hours as needed for nausea or vomiting.  .Marland KitchenRITUXIMAB IV Inject into the vein every 21 ( twenty-one) days.  . tamsulosin (FLOMAX) 0.4 MG CAPS capsule Take 1 capsule (0.4 mg total) by mouth daily after supper.  . vinCRIStine 2 mg in sodium chloride 0.9 % 50 mL Inject 2 mg into the vein every 21 ( twenty-one) days.   Facility-Administered Encounter Medications as of 08/23/2019  Medication  . 0.9 %  sodium chloride infusion  . 0.9 %  sodium chloride infusion  . 0.9 %  sodium chloride  infusion  . sodium chloride flush (NS) 0.9 % injection 10 mL    ALLERGIES:  No Known Allergies   PHYSICAL EXAM:  ECOG Performance status: 1  Vitals:   08/23/19 0757  BP: (!) 121/51  Pulse: 69  Resp: 18  Temp: (!) 97.3 F (36.3 C)  SpO2: 99%   Filed Weights   08/23/19 0757  Weight: 181 lb 6.4 oz (82.3 kg)  Physical Exam Vitals reviewed.  Constitutional:      Appearance: Normal appearance.  Cardiovascular:     Rate and Rhythm: Normal rate and regular rhythm.     Heart sounds: Normal heart sounds.  Pulmonary:     Effort: Pulmonary effort is normal.     Breath sounds: Normal breath sounds.  Abdominal:     General: There is no distension.     Palpations: Abdomen is soft. There is no mass.  Musculoskeletal:        General: No swelling.  Skin:    General: Skin is warm.  Neurological:     General: No focal deficit present.     Mental Status: He is alert and oriented to person, place, and time.  Psychiatric:        Mood and Affect: Mood normal.        Behavior: Behavior normal.      LABORATORY DATA:  I have reviewed the labs as listed.  CBC    Component Value Date/Time   WBC 12.7 (H) 08/23/2019 0758   RBC 3.18 (L) 08/23/2019 0758   HGB 8.3 (L) 08/23/2019 0758   HCT 28.4 (L) 08/23/2019 0758   PLT 325 08/23/2019 0758   MCV 89.3 08/23/2019 0758   MCH 26.1 08/23/2019 0758   MCHC 29.2 (L) 08/23/2019 0758   RDW 17.6 (H) 08/23/2019 0758   LYMPHSABS 1.4 08/23/2019 0758   MONOABS 1.0 08/23/2019 0758   EOSABS 0.2 08/23/2019 0758   BASOSABS 0.1 08/23/2019 0758   CMP Latest Ref Rng & Units 08/23/2019 08/16/2019 08/02/2019  Glucose 70 - 99 mg/dL 125(H) 118(H) 112(H)  BUN 8 - 23 mg/dL _0 Creatinine 0.61 - 1.24 mg/dL 0.61 0.62 0.57(L)  Sodium 135 - 145 mmol/L 141 141 140  Potassium 3.5 - 5.1 mmol/L 3.6 3.9 3.7  Chloride 98 - 111 mmol/L 104 102 103  CO2 22 - 32 mmol/L _1 Calcium 8.9 - 10.3 mg/dL 9.1 9.0 9.1  Total Protein 6.5 - 8.1 g/dL 5.6(L) 5.7(L)  5.6(L)  Total Bilirubin 0.3 - 1.2 mg/dL 0.4 0.3 0.6  Alkaline Phos 38 - 126 U/L 68 77 75  AST 15 - 41 U/L 13(L) 17 14(L)  ALT 0 - 44 U/L _2 DIAGNOSTIC IMAGING:  I have independently reviewed the scans and discussed with the patient.     ASSESSMENT & PLAN:   Diffuse large B-cell lymphoma of lymph nodes of multiple regions ([place]) 1.  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 -PET scan on 05/03/2019 shows bulky intensely hypermetabolic peritoneal and retroperitoneal nodal mass, nodularity.  Additional hypermetabolic adenopathy in the axilla, mediastinum, iliac and inguinal lymph nodes.  Multiple foci of discrete hypermetabolic metastatic disease in the right iliac bone, left iliac crest, left and right scapula, and C1 vertebral body. -5 cycles of R-EPOCH from 05/11/2019 through 08/02/2019. -PET scan on 07/20/2019 showed good response in the mesenteric mass and bone lesions.  There is questionable new lesion in the right lower jaw which I think is from bad dentition in the same area and occasional pain.  There is questionable right lower lobe lung nodule which needs further follow-up. -We reviewed his CBC and LFTs which are acceptable for his next treatment. -He will proceed with his cycle 6  today.  I plan to see him back in 3 to 4 weeks with repeat PET scan. -If he gets a complete response, he might be a candidate for maintenance rituximab as he has follicular lymphoma competent.  If not I will refer him to lymphoma specialist. -He had hematuria today.  He denies any dysuria.  We will send urine for culture.  I will start him on Cipro 500 twice daily for 7 days.  2.  Bilateral leg DVT: -Ultrasound Doppler on 04/18/2019 shows bilateral leg DVT.  He is on Eliquis.  3.  Right hydronephrosis:  -Cystoscopy and bilateral JJ stent placement on 04/21/2019.  He does have occasional hematuria.  4.  Hypophosphatemia: -He will continue Neutra-Phos.  5.  Anxiety: -He will continue Ativan 0.5 mg 1-2 times daily.  6.  TLS prophylaxis: -He will continue allopurinol 300 mg daily.  7.  Hypertension: -He will continue losartan 100 mg daily, Toprol 50 mg daily and Norvasc 10 mg daily.  Blood pressure today is well controlled.  8.  Normocytic anemia: -He is receiving intermittent transfusions.  Last transfusion was on 07/23/2019.   Orders placed this encounter:  Orders Placed This Encounter  Procedures  . NM PET Image Restag (PS) Skull Base To Thigh  . CBC with Differential/Platelet  . Comprehensive metabolic panel  . Magnesium  . Phosphorus  . Uric acid      Derek Jack, MD [place] [phone]

## 2019-08-24 ENCOUNTER — Inpatient Hospital Stay (HOSPITAL_COMMUNITY): Payer: BLUE CROSS/BLUE SHIELD

## 2019-08-24 ENCOUNTER — Encounter (HOSPITAL_COMMUNITY): Payer: Self-pay

## 2019-08-24 VITALS — BP 130/54 | HR 70 | Temp 97.1°F | Resp 18

## 2019-08-24 DIAGNOSIS — Z5112 Encounter for antineoplastic immunotherapy: Secondary | ICD-10-CM | POA: Diagnosis not present

## 2019-08-24 DIAGNOSIS — Z95828 Presence of other vascular implants and grafts: Secondary | ICD-10-CM

## 2019-08-24 DIAGNOSIS — Z7189 Other specified counseling: Secondary | ICD-10-CM | POA: Diagnosis not present

## 2019-08-24 DIAGNOSIS — Z5111 Encounter for antineoplastic chemotherapy: Secondary | ICD-10-CM | POA: Diagnosis not present

## 2019-08-24 DIAGNOSIS — C8338 Diffuse large B-cell lymphoma, lymph nodes of multiple sites: Secondary | ICD-10-CM | POA: Diagnosis not present

## 2019-08-24 LAB — URINE CULTURE: Culture: NO GROWTH

## 2019-08-24 MED ORDER — SODIUM CHLORIDE 0.9 % IV SOLN: Freq: Once | INTRAVENOUS | Status: AC

## 2019-08-24 MED ORDER — SODIUM CHLORIDE 0.9 % IV SOLN
Freq: Once | INTRAVENOUS | Status: AC
  Administered 2019-08-24: 13:00:00 8 mg via INTRAVENOUS
  Filled 2019-08-24: qty 4

## 2019-08-24 MED ORDER — VINCRISTINE SULFATE CHEMO INJECTION 1 MG/ML
Freq: Once | INTRAVENOUS | Status: AC
  Filled 2019-08-24: qty 10

## 2019-08-24 NOTE — Progress Notes (Signed)
Patient tolerated chemotherapy with no complaints voiced.  Port site clean and dry with no bruising or swelling noted at site.  Good blood return noted before and after administration of chemotherapy.  Chemo pump connected with no alarms noted.  Dressing intact.  Patient left ambulatory with VSS and no s/s of distress noted.

## 2019-08-25 ENCOUNTER — Other Ambulatory Visit: Payer: Self-pay

## 2019-08-25 ENCOUNTER — Encounter (HOSPITAL_COMMUNITY): Payer: Self-pay

## 2019-08-25 ENCOUNTER — Inpatient Hospital Stay (HOSPITAL_COMMUNITY): Payer: BLUE CROSS/BLUE SHIELD

## 2019-08-25 VITALS — BP 135/54 | HR 58 | Temp 97.3°F | Resp 18

## 2019-08-25 DIAGNOSIS — C8338 Diffuse large B-cell lymphoma, lymph nodes of multiple sites: Secondary | ICD-10-CM | POA: Diagnosis not present

## 2019-08-25 DIAGNOSIS — Z5112 Encounter for antineoplastic immunotherapy: Secondary | ICD-10-CM | POA: Diagnosis not present

## 2019-08-25 DIAGNOSIS — Z95828 Presence of other vascular implants and grafts: Secondary | ICD-10-CM

## 2019-08-25 DIAGNOSIS — Z5111 Encounter for antineoplastic chemotherapy: Secondary | ICD-10-CM | POA: Diagnosis not present

## 2019-08-25 DIAGNOSIS — Z7189 Other specified counseling: Secondary | ICD-10-CM | POA: Diagnosis not present

## 2019-08-25 MED ORDER — SODIUM CHLORIDE 0.9 % IV SOLN: INTRAVENOUS | Status: DC

## 2019-08-25 MED ORDER — VINCRISTINE SULFATE CHEMO INJECTION 1 MG/ML
Freq: Once | INTRAVENOUS | Status: AC
  Filled 2019-08-25: qty 10

## 2019-08-25 MED ORDER — SODIUM CHLORIDE 0.9 % IV SOLN
Freq: Once | INTRAVENOUS | Status: AC
  Administered 2019-08-25: 12:00:00 8 mg via INTRAVENOUS
  Filled 2019-08-25: qty 4

## 2019-08-25 MED ORDER — SODIUM CHLORIDE 0.9% FLUSH
10.0000 mL | INTRAVENOUS | Status: DC | PRN
  Administered 2019-08-25: 10 mL via INTRAVENOUS

## 2019-08-25 NOTE — Progress Notes (Signed)
Kevin [name] tolerated refill of ambulatory pump well without complaints or incident. Pt discharged with ambulatory pump infusing without issues. Pt discharged self ambulatory in satisfactory condition

## 2019-08-25 NOTE — Patient Instructions (Signed)
[facility] Discharge Instructions for Patients Receiving Chemotherapy   Beginning January 23rd 2017 lab work for the [facility] will be done in the  Main lab at [facility] on 1st floor. If you have a lab appointment with the [facility] please come in thru the  Main Entrance and check in at the main information desk   Today you received the following chemotherapy agents Adriamycin,Vincristine and VP-16 pump. Follow-up as scheduled. Call clinic for any questions or concerns  To help prevent nausea and vomiting after your treatment, we encourage you to take your nausea medication   If you develop nausea and vomiting, or diarrhea that is not controlled by your medication, call the clinic.  The clinic phone number is [phone]. Office hours are Monday-Friday 8:30am-5:00pm.  BELOW ARE SYMPTOMS THAT SHOULD BE REPORTED IMMEDIATELY:  *FEVER GREATER THAN 101.0 F  *CHILLS WITH OR WITHOUT FEVER  NAUSEA AND VOMITING THAT IS NOT CONTROLLED WITH YOUR NAUSEA MEDICATION  *UNUSUAL SHORTNESS OF BREATH  *UNUSUAL BRUISING OR BLEEDING  TENDERNESS IN MOUTH AND THROAT WITH OR WITHOUT PRESENCE OF ULCERS  *URINARY PROBLEMS  *BOWEL PROBLEMS  UNUSUAL RASH Items with * indicate a potential emergency and should be followed up as soon as possible. If you have an emergency after office hours please contact your primary care physician or go to the nearest emergency department.  Please call the clinic during office hours if you have any questions or concerns.   You may also contact the Patient Navigator at [phone] should you have any questions or need assistance in obtaining follow up care.      Resources For Cancer Patients and their Caregivers ? American Cancer Society: Can assist with transportation, wigs, general needs, runs Look Good Feel Better.        ([phone] 605-3797 ? Cancer Care: Provides financial assistance, online support groups, medication/co-pay  assistance.  1-800-813-HOPE 585-299-4854) ? [facility] Assists [place] cancer patients and their families through emotional , educational and financial support.  ([phone] [phone] ? [place] DSS Where to apply for food stamps, Medicaid and utility assistance. 380-297-8300 ? [facility]: Transportation to medical appointments. (989)280-7498 ? Social Security Administration: May apply for disability if have a Stage IV cancer. 803 578 [phone] ? [employer], Disability and Transit Services: Assists with nutrition, care and transit needs. [phone]

## 2019-08-26 ENCOUNTER — Inpatient Hospital Stay (HOSPITAL_COMMUNITY): Payer: BLUE CROSS/BLUE SHIELD

## 2019-08-26 VITALS — BP 147/53 | HR 61 | Temp 97.3°F | Resp 18

## 2019-08-26 DIAGNOSIS — Z95828 Presence of other vascular implants and grafts: Secondary | ICD-10-CM

## 2019-08-26 DIAGNOSIS — Z5111 Encounter for antineoplastic chemotherapy: Secondary | ICD-10-CM | POA: Diagnosis not present

## 2019-08-26 DIAGNOSIS — C8338 Diffuse large B-cell lymphoma, lymph nodes of multiple sites: Secondary | ICD-10-CM | POA: Diagnosis not present

## 2019-08-26 DIAGNOSIS — Z7189 Other specified counseling: Secondary | ICD-10-CM | POA: Diagnosis not present

## 2019-08-26 DIAGNOSIS — Z5112 Encounter for antineoplastic immunotherapy: Secondary | ICD-10-CM | POA: Diagnosis not present

## 2019-08-26 MED ORDER — SODIUM CHLORIDE 0.9 % IV SOLN
Freq: Once | INTRAVENOUS | Status: AC
  Administered 2019-08-26: 12:00:00 8 mg via INTRAVENOUS
  Filled 2019-08-26: qty 4

## 2019-08-26 MED ORDER — SODIUM CHLORIDE 0.9 % IV SOLN: INTRAVENOUS | Status: AC

## 2019-08-26 MED ORDER — VINCRISTINE SULFATE CHEMO INJECTION 1 MG/ML
Freq: Once | INTRAVENOUS | Status: AC
  Filled 2019-08-26: qty 10

## 2019-08-26 MED ORDER — SODIUM CHLORIDE 0.9% FLUSH
10.0000 mL | INTRAVENOUS | Status: AC | PRN
  Administered 2019-08-26: 10 mL via INTRAVENOUS

## 2019-08-26 NOTE — Patient Instructions (Signed)
[facility] Discharge Instructions for Patients Receiving Chemotherapy  Today you received the following chemotherapy agents   To help prevent nausea and vomiting after your treatment, we encourage you to take your nausea medication   If you develop nausea and vomiting that is not controlled by your nausea medication, call the clinic.   BELOW ARE SYMPTOMS THAT SHOULD BE REPORTED IMMEDIATELY:  *FEVER GREATER THAN 100.5 F  *CHILLS WITH OR WITHOUT FEVER  NAUSEA AND VOMITING THAT IS NOT CONTROLLED WITH YOUR NAUSEA MEDICATION  *UNUSUAL SHORTNESS OF BREATH  *UNUSUAL BRUISING OR BLEEDING  TENDERNESS IN MOUTH AND THROAT WITH OR WITHOUT PRESENCE OF ULCERS  *URINARY PROBLEMS  *BOWEL PROBLEMS  UNUSUAL RASH Items with * indicate a potential emergency and should be followed up as soon as possible.  Feel free to call the clinic should you have any questions or concerns. The clinic phone number is [phone].  Please show the CHEMO ALERT CARD at check-in to the Emergency Department and triage nurse.   

## 2019-08-26 NOTE — Progress Notes (Unsigned)
Patient presents today for treatment. Vital signs stable. MAR reviewed. Patient has no complaints of any significant changes since his last visit.   Treatment given today per MD orders. Tolerated infusion without adverse affects. Vital signs stable. No complaints at this time. Pump infusing per protocol. RUN noted on the screen. Discharged from clinic ambulatory. F/U with [facility] as scheduled.

## 2019-08-27 ENCOUNTER — Inpatient Hospital Stay (HOSPITAL_COMMUNITY): Payer: BLUE CROSS/BLUE SHIELD

## 2019-08-27 ENCOUNTER — Other Ambulatory Visit: Payer: Self-pay

## 2019-08-27 ENCOUNTER — Encounter (HOSPITAL_COMMUNITY): Payer: Self-pay

## 2019-08-27 VITALS — BP 139/53 | HR 53 | Temp 97.9°F | Resp 18

## 2019-08-27 DIAGNOSIS — Z7189 Other specified counseling: Secondary | ICD-10-CM | POA: Diagnosis not present

## 2019-08-27 DIAGNOSIS — C8338 Diffuse large B-cell lymphoma, lymph nodes of multiple sites: Secondary | ICD-10-CM

## 2019-08-27 DIAGNOSIS — Z5112 Encounter for antineoplastic immunotherapy: Secondary | ICD-10-CM | POA: Diagnosis not present

## 2019-08-27 DIAGNOSIS — Z95828 Presence of other vascular implants and grafts: Secondary | ICD-10-CM

## 2019-08-27 DIAGNOSIS — Z5111 Encounter for antineoplastic chemotherapy: Secondary | ICD-10-CM | POA: Diagnosis not present

## 2019-08-27 MED ORDER — HEPARIN SOD (PORK) LOCK FLUSH 100 UNIT/ML IV SOLN
500.0000 [IU] | Freq: Once | INTRAVENOUS | Status: AC
  Administered 2019-08-27: 500 [IU] via INTRAVENOUS

## 2019-08-27 MED ORDER — SODIUM CHLORIDE 0.9 % IV SOLN
750.0000 mg/m2 | Freq: Once | INTRAVENOUS | Status: AC
  Administered 2019-08-27: 1560 mg via INTRAVENOUS
  Filled 2019-08-27: qty 78

## 2019-08-27 MED ORDER — SODIUM CHLORIDE 0.9% FLUSH
10.0000 mL | INTRAVENOUS | Status: DC | PRN
  Administered 2019-08-27: 10 mL via INTRAVENOUS

## 2019-08-27 MED ORDER — SODIUM CHLORIDE 0.9 % IV SOLN: INTRAVENOUS | Status: DC

## 2019-08-27 MED ORDER — SODIUM CHLORIDE 0.9 % IV SOLN
Freq: Once | INTRAVENOUS | Status: AC
  Administered 2019-08-27: 16 mg via INTRAVENOUS
  Filled 2019-08-27: qty 8

## 2019-08-27 NOTE — Patient Instructions (Signed)
[facility] Discharge Instructions for Patients Receiving Chemotherapy   Beginning January 23rd 2017 lab work for the [facility] will be done in the  Main lab at [facility] on 1st floor. If you have a lab appointment with the [facility] please come in thru the  Main Entrance and check in at the main information desk   Today you received the following chemotherapy agents Cytoxan. Follow-up as scheduled. Call clinic for any questions or concerns  To help prevent nausea and vomiting after your treatment, we encourage you to take your nausea medication   If you develop nausea and vomiting, or diarrhea that is not controlled by your medication, call the clinic.  The clinic phone number is [phone]. Office hours are Monday-Friday 8:30am-5:00pm.  BELOW ARE SYMPTOMS THAT SHOULD BE REPORTED IMMEDIATELY:  *FEVER GREATER THAN 101.0 F  *CHILLS WITH OR WITHOUT FEVER  NAUSEA AND VOMITING THAT IS NOT CONTROLLED WITH YOUR NAUSEA MEDICATION  *UNUSUAL SHORTNESS OF BREATH  *UNUSUAL BRUISING OR BLEEDING  TENDERNESS IN MOUTH AND THROAT WITH OR WITHOUT PRESENCE OF ULCERS  *URINARY PROBLEMS  *BOWEL PROBLEMS  UNUSUAL RASH Items with * indicate a potential emergency and should be followed up as soon as possible. If you have an emergency after office hours please contact your primary care physician or go to the nearest emergency department.  Please call the clinic during office hours if you have any questions or concerns.   You may also contact the Patient Navigator at [phone] should you have any questions or need assistance in obtaining follow up care.      Resources For Cancer Patients and their Caregivers ? American Cancer Society: Can assist with transportation, wigs, general needs, runs Look Good Feel Better.        ([phone] [phone] ? Cancer Care: Provides financial assistance, online support groups, medication/co-pay assistance.  1-800-813-HOPE  229 065 1071) ? [facility] Assists [place] cancer patients and their families through emotional , educational and financial support.  623-693-4767 ? [place] DSS Where to apply for food stamps, Medicaid and utility assistance. 3671261394 ? [facility]: Transportation to medical appointments. 639-285-5664 ? Social Security Administration: May apply for disability if have a Stage IV cancer. 770 [phone] ? [employer], Disability and Transit Services: Assists with nutrition, care and transit needs. [phone]

## 2019-08-27 NOTE — Progress Notes (Signed)
Kevin [name] tolerated Cytoxan infusion and discontinuation of 5FU pump well without complaints or incident. VSS upon discharge. Pt discharged self ambulatory in satisfactory condition

## 2019-08-30 ENCOUNTER — Inpatient Hospital Stay (HOSPITAL_COMMUNITY): Payer: BLUE CROSS/BLUE SHIELD | Attending: Hematology

## 2019-08-30 ENCOUNTER — Encounter (HOSPITAL_COMMUNITY): Payer: Self-pay

## 2019-08-30 ENCOUNTER — Other Ambulatory Visit: Payer: Self-pay

## 2019-08-30 VITALS — BP 108/60 | HR 72 | Temp 97.5°F | Resp 16

## 2019-08-30 DIAGNOSIS — Z7901 Long term (current) use of anticoagulants: Secondary | ICD-10-CM | POA: Diagnosis not present

## 2019-08-30 DIAGNOSIS — D649 Anemia, unspecified: Secondary | ICD-10-CM | POA: Diagnosis not present

## 2019-08-30 DIAGNOSIS — C8338 Diffuse large B-cell lymphoma, lymph nodes of multiple sites: Secondary | ICD-10-CM | POA: Diagnosis not present

## 2019-08-30 DIAGNOSIS — I82403 Acute embolism and thrombosis of unspecified deep veins of lower extremity, bilateral: Secondary | ICD-10-CM | POA: Diagnosis not present

## 2019-08-30 DIAGNOSIS — R319 Hematuria, unspecified: Secondary | ICD-10-CM | POA: Insufficient documentation

## 2019-08-30 DIAGNOSIS — Z95828 Presence of other vascular implants and grafts: Secondary | ICD-10-CM

## 2019-08-30 DIAGNOSIS — Z5189 Encounter for other specified aftercare: Secondary | ICD-10-CM | POA: Diagnosis not present

## 2019-08-30 DIAGNOSIS — N1330 Unspecified hydronephrosis: Secondary | ICD-10-CM | POA: Insufficient documentation

## 2019-08-30 DIAGNOSIS — I10 Essential (primary) hypertension: Secondary | ICD-10-CM | POA: Insufficient documentation

## 2019-08-30 MED ORDER — PEGFILGRASTIM-JMDB 6 MG/0.6ML ~~LOC~~ SOSY
6.0000 mg | PREFILLED_SYRINGE | Freq: Once | SUBCUTANEOUS | Status: AC
  Administered 2019-08-30: 6 mg via SUBCUTANEOUS

## 2019-08-30 NOTE — Progress Notes (Signed)
Patient tolerated injection with no complaints voiced.  Site clean and dry with no bruising or swelling noted at site.  Band aid applied.  VSS with discharge and left ambulatory with no s/s of distress noted.    

## 2019-09-04 DIAGNOSIS — J9601 Acute respiratory failure with hypoxia: Secondary | ICD-10-CM | POA: Diagnosis not present

## 2019-09-04 DIAGNOSIS — I272 Pulmonary hypertension, unspecified: Secondary | ICD-10-CM | POA: Diagnosis not present

## 2019-09-06 ENCOUNTER — Other Ambulatory Visit (HOSPITAL_COMMUNITY): Payer: Self-pay | Admitting: *Deleted

## 2019-09-06 ENCOUNTER — Encounter (HOSPITAL_COMMUNITY): Payer: Self-pay

## 2019-09-06 MED ORDER — HYDROCODONE-ACETAMINOPHEN 5-325 MG PO TABS: 1.0000 | ORAL_TABLET | Freq: Three times a day (TID) | ORAL | 0 refills | Status: DC | PRN

## 2019-09-07 ENCOUNTER — Other Ambulatory Visit: Payer: Self-pay | Admitting: General Surgery

## 2019-09-07 MED ORDER — APIXABAN 5 MG PO TABS: 5.0000 mg | ORAL_TABLET | Freq: Two times a day (BID) | ORAL | 3 refills | Status: DC

## 2019-09-10 ENCOUNTER — Other Ambulatory Visit: Payer: Self-pay | Admitting: General Surgery

## 2019-09-14 ENCOUNTER — Telehealth: Payer: Self-pay | Admitting: Family Medicine

## 2019-09-14 ENCOUNTER — Other Ambulatory Visit (HOSPITAL_COMMUNITY): Payer: Self-pay | Admitting: Nurse Practitioner

## 2019-09-14 MED ORDER — APIXABAN 5 MG PO TABS: 5.0000 mg | ORAL_TABLET | Freq: Two times a day (BID) | ORAL | 3 refills | Status: DC

## 2019-09-14 NOTE — Telephone Encounter (Signed)
Resent, daughter aware. First attempt to send was on no print

## 2019-09-17 ENCOUNTER — Other Ambulatory Visit: Payer: Self-pay

## 2019-09-17 ENCOUNTER — Ambulatory Visit (HOSPITAL_COMMUNITY)
Admission: RE | Admit: 2019-09-17 | Discharge: 2019-09-17 | Disposition: A | Discharge status: Home / Self Care | Payer: BLUE CROSS/BLUE SHIELD | Source: Ambulatory Visit | Attending: Hematology | Admitting: Hematology

## 2019-09-17 DIAGNOSIS — C8338 Diffuse large B-cell lymphoma, lymph nodes of multiple sites: Secondary | ICD-10-CM

## 2019-09-17 DIAGNOSIS — C8330 Diffuse large B-cell lymphoma, unspecified site: Secondary | ICD-10-CM | POA: Diagnosis not present

## 2019-09-17 LAB — GLUCOSE, CAPILLARY: Glucose-Capillary: 108 mg/dL — ABNORMAL HIGH (ref 70–99)

## 2019-09-17 MED ORDER — FLUDEOXYGLUCOSE F - 18 (FDG) INJECTION
9.0000 | Freq: Once | INTRAVENOUS | Status: AC
  Administered 2019-09-17: 9 via INTRAVENOUS

## 2019-09-20 ENCOUNTER — Other Ambulatory Visit: Payer: Self-pay

## 2019-09-20 ENCOUNTER — Inpatient Hospital Stay (HOSPITAL_COMMUNITY): Payer: BLUE CROSS/BLUE SHIELD

## 2019-09-20 ENCOUNTER — Encounter (HOSPITAL_COMMUNITY): Payer: Self-pay | Admitting: Hematology

## 2019-09-20 ENCOUNTER — Inpatient Hospital Stay (HOSPITAL_BASED_OUTPATIENT_CLINIC_OR_DEPARTMENT_OTHER): Payer: BLUE CROSS/BLUE SHIELD | Admitting: Hematology

## 2019-09-20 ENCOUNTER — Other Ambulatory Visit: Payer: Self-pay | Admitting: Urology

## 2019-09-20 VITALS — BP 127/53 | HR 77 | Temp 98.7°F | Resp 18 | Wt 188.8 lb

## 2019-09-20 DIAGNOSIS — C8338 Diffuse large B-cell lymphoma, lymph nodes of multiple sites: Secondary | ICD-10-CM

## 2019-09-20 DIAGNOSIS — Z5189 Encounter for other specified aftercare: Secondary | ICD-10-CM | POA: Diagnosis not present

## 2019-09-20 DIAGNOSIS — D649 Anemia, unspecified: Secondary | ICD-10-CM | POA: Diagnosis not present

## 2019-09-20 DIAGNOSIS — I10 Essential (primary) hypertension: Secondary | ICD-10-CM | POA: Diagnosis not present

## 2019-09-20 DIAGNOSIS — N1330 Unspecified hydronephrosis: Secondary | ICD-10-CM | POA: Diagnosis not present

## 2019-09-20 DIAGNOSIS — R319 Hematuria, unspecified: Secondary | ICD-10-CM | POA: Diagnosis not present

## 2019-09-20 DIAGNOSIS — Z7901 Long term (current) use of anticoagulants: Secondary | ICD-10-CM | POA: Diagnosis not present

## 2019-09-20 DIAGNOSIS — I82403 Acute embolism and thrombosis of unspecified deep veins of lower extremity, bilateral: Secondary | ICD-10-CM | POA: Diagnosis not present

## 2019-09-20 LAB — CBC WITH DIFFERENTIAL/PLATELET
Abs Immature Granulocytes: 0.06 10*3/uL (ref 0.00–0.07)
Basophils Absolute: 0.1 10*3/uL (ref 0.0–0.1)
Basophils Relative: 1 %
Eosinophils Absolute: 0.2 10*3/uL (ref 0.0–0.5)
Eosinophils Relative: 2 %
HCT: 27.8 % — ABNORMAL LOW (ref 39.0–52.0)
Hemoglobin: 8.2 g/dL — ABNORMAL LOW (ref 13.0–17.0)
Immature Granulocytes: 1 %
Lymphocytes Relative: 12 %
Lymphs Abs: 1.1 10*3/uL (ref 0.7–4.0)
MCH: 27 pg (ref 26.0–34.0)
MCHC: 29.5 g/dL — ABNORMAL LOW (ref 30.0–36.0)
MCV: 91.4 fL (ref 80.0–100.0)
Monocytes Absolute: 0.9 10*3/uL (ref 0.1–1.0)
Monocytes Relative: 9 %
Neutro Abs: 7.4 10*3/uL (ref 1.7–7.7)
Neutrophils Relative %: 75 %
Platelets: 294 10*3/uL (ref 150–400)
RBC: 3.04 MIL/uL — ABNORMAL LOW (ref 4.22–5.81)
RDW: 18.7 % — ABNORMAL HIGH (ref 11.5–15.5)
WBC: 9.8 10*3/uL (ref 4.0–10.5)
nRBC: 0 % (ref 0.0–0.2)

## 2019-09-20 LAB — URIC ACID: Uric Acid, Serum: 3.8 mg/dL (ref 3.7–8.6)

## 2019-09-20 LAB — COMPREHENSIVE METABOLIC PANEL
ALT: 10 U/L (ref 0–44)
AST: 13 U/L — ABNORMAL LOW (ref 15–41)
Albumin: 3.2 g/dL — ABNORMAL LOW (ref 3.5–5.0)
Alkaline Phosphatase: 57 U/L (ref 38–126)
Anion gap: 6 (ref 5–15)
BUN: 20 mg/dL (ref 8–23)
CO2: 30 mmol/L (ref 22–32)
Calcium: 9 mg/dL (ref 8.9–10.3)
Chloride: 106 mmol/L (ref 98–111)
Creatinine, Ser: 0.54 mg/dL — ABNORMAL LOW (ref 0.61–1.24)
GFR calc Af Amer: >60 mL/min (ref >60)
GFR calc non Af Amer: >60 mL/min (ref >60)
Glucose, Bld: 117 mg/dL — ABNORMAL HIGH (ref 70–99)
Potassium: 3.8 mmol/L (ref 3.5–5.1)
Sodium: 142 mmol/L (ref 135–145)
Total Bilirubin: 0.3 mg/dL (ref 0.3–1.2)
Total Protein: 5.6 g/dL — ABNORMAL LOW (ref 6.5–8.1)

## 2019-09-20 LAB — PHOSPHORUS: Phosphorus: 4.5 mg/dL (ref 2.5–4.6)

## 2019-09-20 LAB — MAGNESIUM: Magnesium: 1.7 mg/dL (ref 1.7–2.4)

## 2019-09-20 MED ORDER — SODIUM CHLORIDE 0.9% FLUSH
10.0000 mL | Freq: Once | INTRAVENOUS | Status: AC
  Administered 2019-09-20: 08:00:00 10 mL via INTRAVENOUS

## 2019-09-20 MED ORDER — HEPARIN SOD (PORK) LOCK FLUSH 100 UNIT/ML IV SOLN
500.0000 [IU] | Freq: Once | INTRAVENOUS | Status: AC
  Administered 2019-09-20: 500 [IU] via INTRAVENOUS

## 2019-09-20 MED ORDER — PHENAZOPYRIDINE HCL 100 MG PO TABS: 100.0000 mg | ORAL_TABLET | Freq: Three times a day (TID) | ORAL | 0 refills | Status: DC | PRN

## 2019-09-20 NOTE — Assessment & Plan Note (Addendum)
1.  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 -PET scan on 05/03/2019 shows bulky intensely hypermetabolic peritoneal and retroperitoneal nodal mass and nodularity.  Additional hypermetabolic adenopathy in the axilla, mediastinum, iliac and inguinal lymph nodes.  Multifocal metabolic disease in the right iliac bone, left iliac crest, left and right scapula, and C1 vertebral body. -6 cycles of R-EPOCH from 05/11/2019 through 08/23/2019. -Interim PET scan on 07/20/2019 showed good response in the mesenteric mass and bone lesions.  Questionable new lesion in the right lower jaw and questionable right lower lobe lung nodule. -We reviewed results of the PET scan on 09/17/2019 showing central abdominal mass SUV 2.9 measuring 10 x 3.7 cm.  Mildly enlarged right pelvic sidewall lymph node 12 mm SUV 1.7.  Areas of multifocal bone involvement with near complete resolution.  Overall this is considered Deauville 2/3 in the five-point scale. -At this point, I would recommend consultation with the lymphoma specialist.  Further management at this point is not very clear.  Options include maintenance rituximab as he has component of follicular lymphoma.  Other option is to consider stem cell transplant.  We will make an appointment to see Dr. Elita Boone at [facility].  I will see him back in a month for follow-up.  2.  Bilateral leg DVT: -Ultrasound Doppler on 04/18/2019 shows bilateral leg DVT.  He is on Eliquis.  3.  Right hydronephrosis: -Cystoscopy and bilateral JJ stent placement on 04/21/2019.  He does have occasional hematuria. -He is having urinary bleeding with increased activity.  He has follow-up with urology on 10/04/2019.  4.  Anxiety: -He will continue Ativan 0.5 mg 1-2 times daily.  5.  Hypophosphatemia: -He is taking Neutra-Phos 3  times a day.  He will decrease it to 2 times daily.  His phosphate is 4.5.  6.  Hypertension: -He is taking losartan 100 mg daily, Toprol-XL 50 mg daily and Norvasc 10 mg daily.  7.  Normocytic anemia: -He is requiring intermittent transfusions.  Last transfusion on 07/23/2019. -His hemoglobin today is 8.2.  It will likely improve now that he completed chemotherapy.

## 2019-09-20 NOTE — Progress Notes (Signed)
[place], [place] [address]   CLINIC:  Medical Oncology/Hematology  PCP:  Loman Brooklyn, [place] [address] [phone]   REASON FOR VISIT:  Follow-up for high-grade lymphoma.  CURRENT THERAPY: Dose adjusted R-EPOCH  BRIEF ONCOLOGIC HISTORY:  Oncology History  Diffuse large B-cell lymphoma of lymph nodes of multiple regions ([place])  04/26/2019 Initial Diagnosis   Diffuse large B-cell lymphoma of lymph nodes of multiple regions ([place])   05/10/2019 -  Chemotherapy   The patient had pegfilgrastim (NEULASTA ONPRO KIT) injection 6 mg, 6 mg, Subcutaneous, Once, 1 of 1 cycle pegfilgrastim-jmdb (FULPHILA) injection 6 mg, 6 mg, Subcutaneous,  Once, 6 of 7 cycles Administration: 6 mg (05/18/2019), 6 mg (06/07/2019), 6 mg (06/26/2019), 6 mg (07/19/2019), 6 mg (08/09/2019), 6 mg (08/30/2019) DOXOrubicin (ADRIAMYCIN) 20 mg, etoposide (VEPESID) 104 mg, vinCRIStine (ONCOVIN) 0.8 mg in sodium chloride 0.9 % 500 mL chemo infusion, , Intravenous, Once, 6 of 7 cycles Administration:  (05/11/2019),  (05/31/2019),  (06/01/2019),  (05/12/2019),  (05/13/2019),  (05/14/2019),  (06/02/2019),  (06/03/2019),  (06/20/2019),  (06/21/2019),  (06/22/2019),  (07/12/2019),  (06/23/2019),  (07/13/2019),  (07/14/2019),  (07/15/2019),  (08/02/2019),  (08/03/2019),  (08/04/2019),  (08/05/2019),  (08/23/2019),  (08/24/2019),  (08/25/2019),  (08/26/2019) ondansetron (ZOFRAN) 8 mg in sodium chloride 0.9 % 50 mL IVPB, , Intravenous,  Once, 6 of 7 cycles Administration: 8 mg (05/10/2019), 8 mg (05/11/2019), 16 mg (05/17/2019), 8 mg (06/01/2019), 16 mg (06/04/2019), 8 mg (05/12/2019), 8 mg (05/13/2019), 8 mg (05/14/2019), 8 mg (06/02/2019), 8 mg (06/03/2019), 8 mg (06/20/2019), 16 mg (06/24/2019), 8 mg (06/22/2019), 8 mg (07/12/2019), 8 mg (06/23/2019), 8 mg (07/13/2019), 16 mg (07/16/2019), 8 mg (07/14/2019), 8 mg (07/15/2019), 8 mg (08/02/2019), 8 mg (08/03/2019), 8 mg (08/04/2019), 8 mg (08/05/2019), 16 mg  (08/06/2019), 8 mg (08/23/2019), 8 mg (08/24/2019), 8 mg (08/25/2019), 8 mg (08/26/2019), 16 mg (08/27/2019) cyclophosphamide (CYTOXAN) 1,560 mg in sodium chloride 0.9 % 250 mL chemo infusion, 750 mg/m2 = 1,560 mg, Intravenous,  Once, 6 of 7 cycles Administration: 1,560 mg (05/17/2019), 1,560 mg (06/04/2019), 1,560 mg (06/24/2019), 1,560 mg (07/16/2019), 1,560 mg (08/06/2019), 1,560 mg (08/27/2019) riTUXimab-pvvr (RUXIENCE) 800 mg in sodium chloride 0.9 % 250 mL (2.4242 mg/mL) infusion, 375 mg/m2 = 800 mg, Intravenous,  Once, 6 of 7 cycles Dose modification: 100 mg (original dose 375 mg/m2, Cycle 1, Reason: Other (see comments), Comment: test dose), 600 mg (original dose 375 mg/m2, Cycle 1, Reason: Other (see comments), Comment: due to previous reaction giving test dose first), 400 mg (original dose 375 mg/m2, Cycle 2, Reason: Other (see comments), Comment: remainder of day before fluid - do not bill) Administration: 800 mg (05/10/2019), 800 mg (05/31/2019), 100 mg (05/17/2019), 400 mg (06/01/2019), 800 mg (06/21/2019), 800 mg (07/12/2019), 800 mg (08/02/2019), 800 mg (08/23/2019)  for chemotherapy treatment.    05/11/2019 - 05/11/2019 Chemotherapy   The patient had DOXOrubicin (ADRIAMYCIN) chemo injection 104 mg, 50 mg/m2, Intravenous,  Once, 0 of 6 cycles palonosetron (ALOXI) injection 0.25 mg, 0.25 mg, Intravenous,  Once, 0 of 6 cycles pegfilgrastim-jmdb (FULPHILA) injection 6 mg, 6 mg, Subcutaneous,  Once, 0 of 6 cycles vinCRIStine (ONCOVIN) 2 mg in sodium chloride 0.9 % 50 mL chemo infusion, 2 mg, Intravenous,  Once, 0 of 6 cycles cyclophosphamide (CYTOXAN) 1,540 mg in sodium chloride 0.9 % 250 mL chemo infusion, 750 mg/m2, Intravenous,  Once, 0 of 6 cycles fosaprepitant (EMEND) 150 mg, dexamethasone (DECADRON) 12 mg in sodium chloride 0.9 %  145 mL IVPB, , Intravenous,  Once, 0 of 6 cycles riTUXimab-pvvr (RUXIENCE) 800 mg in sodium chloride 0.9 % 250 mL (2.4242 mg/mL) infusion, 375 mg/m2, Intravenous,  Once, 0 of  6 cycles  for chemotherapy treatment.       CANCER STAGING: Cancer Staging No matching staging information was found for the patient.   INTERVAL HISTORY:  Mr. Cosens 64 y.o. male seen for follow-up of for double hit lymphoma.  Reports appetite and energy level 75%.  Reports urinary bleeding when he increases activity.  Denies any fevers or chills.  Denies any tingling or numbness in extremities.  Has tolerated last cycle of chemotherapy reasonably well.  He is accompanied by his daughter today.  Denies any nausea vomiting diarrhea or constipation.  REVIEW OF SYSTEMS:  Review of Systems  Genitourinary: Positive for hematuria.   All other systems reviewed and are negative.    PAST MEDICAL/SURGICAL HISTORY:  Past Medical History:  Diagnosis Date  . Diffuse large B cell lymphoma ([place]) 04/26/2019  . Hyperlipidemia 02/01/2014  . Hypertension   . Leg DVT (deep venous thromboembolism), acute, bilateral ([place]) 04/18/2019  . Port-A-Cath in place 04/27/2019   Past Surgical History:  Procedure Laterality Date  . CYSTOSCOPY W/ URETERAL STENT PLACEMENT Bilateral 04/21/2019   Procedure: CYSTOSCOPY WITH RETROGRADE PYELOGRAM/URETERAL STENT PLACEMENT;  Surgeon: Cleon Gustin, MD;  Location: [facility];  Service: Urology;  Laterality: Bilateral;  . INGUINAL LYMPH NODE BIOPSY Right 04/21/2019   Procedure: INGUINAL LYMPH NODE BIOPSY;  Surgeon: Virl Cagey, MD;  Location: [facility];  Service: General;  Laterality: Right;  . PORTACATH PLACEMENT Left 05/07/2019   Procedure: INSERTION PORT-A-CATH (catheter left subclavian);  Surgeon: Virl Cagey, MD;  Location: [facility];  Service: General;  Laterality: Left;     SOCIAL HISTORY:  Social History   Socioeconomic History  . Marital status: Married    Spouse name: Not on file  . Number of children: Not on file  . Years of education: Not on file  . Highest education level: Not on file  Occupational History  . Not on file  Tobacco Use  .  Smoking status: Former Smoker    Quit date: 04/27/1999    Years since quitting: 20.4  . Smokeless tobacco: Never Used  Substance and Sexual Activity  . Alcohol use: Yes    Alcohol/week: 56.0 standard drinks    Types: 56 Cans of beer per week  . Drug use: Never  . Sexual activity: Not on file  Other Topics Concern  . Not on file  Social History Narrative  . Not on file   Social Determinants of Health   Financial Resource Strain:   . Difficulty of Paying Living Expenses:   Food Insecurity:   . Worried About Charity fundraiser in the Last Year:   . Arboriculturist in the Last Year:   Transportation Needs:   . Film/video editor (Medical):   Marland Kitchen Lack of Transportation (Non-Medical):   Physical Activity:   . Days of Exercise per Week:   . Minutes of Exercise per Session:   Stress:   . Feeling of Stress :   Social Connections:   . Frequency of Communication with Friends and Family:   . Frequency of Social Gatherings with Friends and Family:   . Attends Religious Services:   . Active Member of Clubs or Organizations:   . Attends Archivist Meetings:   Marland Kitchen Marital Status:   Intimate Partner Violence:   .  Fear of Current or Ex-Partner:   . Emotionally Abused:   Marland Kitchen Physically Abused:   . Sexually Abused:     FAMILY HISTORY:  Family History  Problem Relation Age of Onset  . Diabetes Brother   . Diabetes Mother   . Leukemia Brother   . Diabetes Son     CURRENT MEDICATIONS:  Outpatient Encounter Medications as of 09/20/2019  Medication Sig  . allopurinol (ZYLOPRIM) 300 MG tablet TAKE 1 TABLET BY MOUTH EVERY DAY  . amLODipine (NORVASC) 10 MG tablet Take 1 tablet (10 mg total) by mouth daily.  Marland Kitchen apixaban (ELIQUIS) 5 MG TABS tablet Take 1 tablet (5 mg total) by mouth 2 (two) times daily.  . ciprofloxacin (CIPRO) 500 MG tablet Take 1 tablet (500 mg total) by mouth 2 (two) times daily.  . CYCLOPHOSPHAMIDE IV Inject into the vein every 21 ( twenty-one) days.  Marland Kitchen  docusate sodium (COLACE) 100 MG capsule Take 100 mg by mouth 2 (two) times daily.  Marland Kitchen DOXORUBICIN HCL IV Inject into the vein every 21 ( twenty-one) days.  . ETOPOSIDE IV Inject into the vein.  . furosemide (LASIX) 20 MG tablet Take 1 tablet (20 mg total) by mouth daily as needed.  Marland Kitchen HYDROcodone-acetaminophen (NORCO/VICODIN) 5-325 MG tablet Take 1 tablet by mouth every 8 (eight) hours as needed for moderate pain.  . K Phos Mono-Sod Phos Di & Mono (PHOSPHA 250 NEUTRAL) 155-852-130 MG TABS TAKE 1 TABLET BY MOUTH THREE TIMES A DAY  . loratadine (CLARITIN) 10 MG tablet Take 10 mg by mouth daily.  Marland Kitchen LORazepam (ATIVAN) 0.5 MG tablet Take 1 tablet (0.5 mg total) by mouth every 8 (eight) hours.  Marland Kitchen losartan (COZAAR) 100 MG tablet Take 1 tablet (100 mg total) by mouth daily.  . magic mouthwash SOLN Swish 50m for 5 minutes then spit out.  . metoprolol succinate (TOPROL-XL) 50 MG 24 hr tablet Take 1 tablet (50 mg total) by mouth daily.  . mupirocin ointment (BACTROBAN) 2 % Place 1 application into the nose 2 (two) times daily.  . predniSONE (DELTASONE) 20 MG tablet TAKE 3 TABLETS (60 MG TOTAL) BY MOUTH DAILY. TAKE ON DAYS 1-5 OF CHEMOTHERAPY.  .Marland Kitchenprochlorperazine (COMPAZINE) 10 MG tablet Take 10 mg by mouth every 6 (six) hours as needed for nausea or vomiting.  .Marland KitchenRITUXIMAB IV Inject into the vein every 21 ( twenty-one) days.  . tamsulosin (FLOMAX) 0.4 MG CAPS capsule Take 1 capsule (0.4 mg total) by mouth daily after supper.  . vinCRIStine 2 mg in sodium chloride 0.9 % 50 mL Inject 2 mg into the vein every 21 ( twenty-one) days.  . [DISCONTINUED] phenazopyridine (PYRIDIUM) 100 MG tablet TAKE 1 TABLET (100 MG TOTAL) BY MOUTH 3 (THREE) TIMES DAILY AS NEEDED FOR PAIN.   Facility-Administered Encounter Medications as of 09/20/2019  Medication  . 0.9 %  sodium chloride infusion  . 0.9 %  sodium chloride infusion  . 0.9 %  sodium chloride infusion  . 0.9 %  sodium chloride infusion  . sodium chloride flush (NS)  0.9 % injection 10 mL  . sodium chloride flush (NS) 0.9 % injection 10 mL    ALLERGIES:  No Known Allergies   PHYSICAL EXAM:  ECOG Performance status: 1  There were no vitals filed for this visit. There were no vitals filed for this visit.  Physical Exam Vitals reviewed.  Constitutional:      Appearance: Normal appearance.  Cardiovascular:     Rate and Rhythm: Normal rate  and regular rhythm.     Heart sounds: Normal heart sounds.  Pulmonary:     Effort: Pulmonary effort is normal.     Breath sounds: Normal breath sounds.  Abdominal:     General: There is no distension.     Palpations: Abdomen is soft. There is no mass.  Musculoskeletal:        General: No swelling.  Skin:    General: Skin is warm.  Neurological:     General: No focal deficit present.     Mental Status: He is alert and oriented to person, place, and time.  Psychiatric:        Mood and Affect: Mood normal.        Behavior: Behavior normal.      LABORATORY DATA:  I have reviewed the labs as listed.  CBC    Component Value Date/Time   WBC 9.8 09/20/2019 0752   RBC 3.04 (L) 09/20/2019 0752   HGB 8.2 (L) 09/20/2019 0752   HCT 27.8 (L) 09/20/2019 0752   PLT 294 09/20/2019 0752   MCV 91.4 09/20/2019 0752   MCH 27.0 09/20/2019 0752   MCHC 29.5 (L) 09/20/2019 0752   RDW 18.7 (H) 09/20/2019 0752   LYMPHSABS 1.1 09/20/2019 0752   MONOABS 0.9 09/20/2019 0752   EOSABS 0.2 09/20/2019 0752   BASOSABS 0.1 09/20/2019 0752   CMP Latest Ref Rng & Units 09/20/2019 08/23/2019 08/16/2019  Glucose 70 - 99 mg/dL 117(H) 125(H) 118(H)  BUN 8 - 23 mg/dL _0 Creatinine 0.61 - 1.24 mg/dL 0.54(L) 0.61 0.62  Sodium 135 - 145 mmol/L 142 141 141  Potassium 3.5 - 5.1 mmol/L 3.8 3.6 3.9  Chloride 98 - 111 mmol/L 106 104 102  CO2 22 - 32 mmol/L _1 Calcium 8.9 - 10.3 mg/dL 9.0 9.1 9.0  Total Protein 6.5 - 8.1 g/dL 5.6(L) 5.6(L) 5.7(L)  Total Bilirubin 0.3 - 1.2 mg/dL 0.3 0.4 0.3  Alkaline Phos 38 - 126 U/L  57 68 77  AST 15 - 41 U/L 13(L) 13(L) 17  ALT 0 - 44 U/L _2 DIAGNOSTIC IMAGING:  I have reviewed scans and discussed with the patient.     ASSESSMENT & PLAN:   Diffuse large B-cell lymphoma of lymph nodes of multiple regions ([place]) 1.  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 -PET scan on 05/03/2019 shows bulky intensely hypermetabolic peritoneal and retroperitoneal nodal mass and nodularity.  Additional hypermetabolic adenopathy in the axilla, mediastinum, iliac and inguinal lymph nodes.  Multifocal metabolic disease in the right iliac bone, left iliac crest, left and right scapula, and C1 vertebral body. -6 cycles of R-EPOCH from 05/11/2019 through 08/23/2019. -Interim PET scan on 07/20/2019 showed good response in the mesenteric mass and bone lesions.  Questionable new lesion in the right lower jaw and questionable right lower lobe lung nodule. -We reviewed results of the PET scan on 09/17/2019 showing central abdominal mass SUV 2.9 measuring 10 x 3.7 cm.  Mildly enlarged right pelvic sidewall lymph node 12 mm SUV 1.7.  Areas of multifocal bone involvement with near complete resolution.  Overall this is considered Deauville 2/3 in the five-point scale. -At this point, I would recommend consultation with the lymphoma specialist.  Further management at this point is not very  clear.  Options include maintenance rituximab as he has component of follicular lymphoma.  Other option is to consider stem cell transplant.  We will make an appointment to see Dr. Elita Boone at [facility].  I will see him back in a month for follow-up.  2.  Bilateral leg DVT: -Ultrasound Doppler on 04/18/2019 shows bilateral leg DVT.  He is on Eliquis.  3.  Right hydronephrosis: -Cystoscopy and bilateral JJ stent placement on  04/21/2019.  He does have occasional hematuria. -He is having urinary bleeding with increased activity.  He has follow-up with urology on 10/04/2019.  4.  Anxiety: -He will continue Ativan 0.5 mg 1-2 times daily.  5.  Hypophosphatemia: -He is taking Neutra-Phos 3 times a day.  He will decrease it to 2 times daily.  His phosphate is 4.5.  6.  Hypertension: -He is taking losartan 100 mg daily, Toprol-XL 50 mg daily and Norvasc 10 mg daily.  7.  Normocytic anemia: -He is requiring intermittent transfusions.  Last transfusion on 07/23/2019. -His hemoglobin today is 8.2.  It will likely improve now that he completed chemotherapy.     Orders placed this encounter:  No orders of the defined types were placed in this encounter.     Derek Jack, MD [place] [phone]

## 2019-09-20 NOTE — Patient Instructions (Addendum)
[place] at [facility] Discharge Instructions  You were seen today by Dr. Delton Coombes. He went over your recent lab and scan results. He will refer you to a lymphoma specialist at [facility] to evaluate your lymphoma. STOP taking the Claritin and Allopurinol, cut back to 2 tablets of the phosphate. He will see you back in 1 month for labs and follow up.   Thank you for choosing [place] at [facility] to provide your oncology and hematology care.  To afford each patient quality time with our provider, please arrive at least 15 minutes before your scheduled appointment time.   If you have a lab appointment with the [place] [place] please come in thru the  Main Entrance and check in at the main information desk  You need to re-schedule your appointment should you arrive 10 or more minutes late.  We strive to give you quality time with our providers, and arriving late affects you and other patients whose appointments are after yours.  Also, if you no show three or more times for appointments you may be dismissed from the clinic at the providers discretion.     Again, thank you for choosing [facility].  Our hope is that these requests will decrease the amount of time that you wait before being seen by our physicians.       _____________________________________________________________  Should you have questions after your visit to [facility], please contact our office at [phone] between the hours of 8:00 a.m. and 4:30 p.m.  Voicemails left after 4:00 p.m. will not be returned until the following business day.  For prescription refill requests, have your pharmacy contact our office and allow 72 hours.    Cancer Center Support Programs:   > Cancer Support Group  2nd Tuesday of the month 1pm-2pm, Journey Room

## 2019-09-21 ENCOUNTER — Ambulatory Visit (HOSPITAL_COMMUNITY): Payer: BLUE CROSS/BLUE SHIELD

## 2019-09-22 ENCOUNTER — Ambulatory Visit (HOSPITAL_COMMUNITY): Payer: BLUE CROSS/BLUE SHIELD

## 2019-09-23 ENCOUNTER — Ambulatory Visit (HOSPITAL_COMMUNITY): Payer: BLUE CROSS/BLUE SHIELD

## 2019-09-24 ENCOUNTER — Ambulatory Visit (HOSPITAL_COMMUNITY): Payer: BLUE CROSS/BLUE SHIELD

## 2019-09-27 ENCOUNTER — Ambulatory Visit (HOSPITAL_COMMUNITY): Payer: BLUE CROSS/BLUE SHIELD

## 2019-10-04 ENCOUNTER — Ambulatory Visit: Payer: BLUE CROSS/BLUE SHIELD | Admitting: Urology

## 2019-10-04 ENCOUNTER — Encounter: Payer: Self-pay | Admitting: Urology

## 2019-10-04 ENCOUNTER — Other Ambulatory Visit: Payer: Self-pay

## 2019-10-04 VITALS — BP 135/66 | HR 65 | Temp 98.7°F | Ht 66.0 in | Wt 188.0 lb

## 2019-10-04 DIAGNOSIS — N1339 Other hydronephrosis: Secondary | ICD-10-CM | POA: Diagnosis not present

## 2019-10-04 NOTE — Progress Notes (Signed)
Urological Symptom Review  Patient is experiencing the following symptoms: Frequent urination Blood in urine   Review of Systems  Gastrointestinal (upper)  : Negative for upper GI symptoms  Gastrointestinal (lower) : Negative for lower GI symptoms  Constitutional : Negative for symptoms  Skin: Negative for skin symptoms  Eyes: Negative for eye symptoms  Ear/Nose/Throat : Negative for Ear/Nose/Throat symptoms  Hematologic/Lymphatic: Negative for Hematologic/Lymphatic symptoms  Cardiovascular : Negative for cardiovascular symptoms  Respiratory : Negative for respiratory symptoms  Endocrine: Negative for endocrine symptoms  Musculoskeletal: Negative for musculoskeletal symptoms  Neurological: Negative for neurological symptoms  Psychologic: Negative for psychiatric symptoms

## 2019-10-04 NOTE — Progress Notes (Signed)
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 He underwent ureteral stent placement in 04/2019. Pet scan from 08/2018 shows no residual disease. No hydronephrosis. He has severe LUTS with the stent in place. Intermittent hematuria.    PMH: Past Medical History:  Diagnosis Date  . Diffuse large B cell lymphoma ([place]) 04/26/2019  . Hyperlipidemia 02/01/2014  . Hypertension   . Leg DVT (deep venous thromboembolism), acute, bilateral ([place]) 04/18/2019  . Port-A-Cath in place 04/27/2019    Surgical History: Past Surgical History:  Procedure Laterality Date  . CYSTOSCOPY W/ URETERAL STENT PLACEMENT Bilateral 04/21/2019   Procedure: CYSTOSCOPY WITH RETROGRADE PYELOGRAM/URETERAL STENT PLACEMENT;  Surgeon: Cleon Gustin, MD;  Location: [facility];  Service: Urology;  Laterality: Bilateral;  . INGUINAL LYMPH NODE BIOPSY Right 04/21/2019   Procedure: INGUINAL LYMPH NODE BIOPSY;  Surgeon: Virl Cagey, MD;  Location: [facility];  Service: General;  Laterality: Right;  . PORTACATH PLACEMENT Left 05/07/2019   Procedure: INSERTION PORT-A-CATH (catheter left subclavian);  Surgeon: Virl Cagey, MD;  Location: [facility];  Service: General;  Laterality: Left;    Home Medications:  Allergies as of 10/04/2019   No Known Allergies     Medication List       Accurate as of October 04, 2019  3:43 PM. If you have any questions, ask your nurse or doctor.        allopurinol 300 MG tablet Commonly known as: ZYLOPRIM TAKE 1 TABLET BY MOUTH EVERY DAY   amLODipine 10 MG tablet Commonly known as: NORVASC Take 1 tablet (10 mg total) by mouth daily.   apixaban 5 MG Tabs tablet Commonly known as: ELIQUIS Take 1 tablet (5 mg total) by mouth 2 (two) times daily.     ciprofloxacin 500 MG tablet Commonly known as: Cipro Take 1 tablet (500 mg total) by mouth 2 (two) times daily.   CYCLOPHOSPHAMIDE IV Inject into the vein every 21 ( twenty-one) days.   docusate sodium 100 MG capsule Commonly known as: COLACE Take 100 mg by mouth 2 (two) times daily.   DOXORUBICIN HCL IV Inject into the vein every 21 ( twenty-one) days.   ETOPOSIDE IV Inject into the vein.   furosemide 20 MG tablet Commonly known as: LASIX Take 1 tablet (20 mg total) by mouth daily as needed.   HYDROcodone-acetaminophen 5-325 MG tablet Commonly known as: NORCO/VICODIN Take 1 tablet by mouth every 8 (eight) hours as needed for moderate pain.   loratadine 10 MG tablet Commonly known as: CLARITIN Take 10 mg by mouth daily.   LORazepam 0.5 MG tablet Commonly known as: ATIVAN Take 1 tablet (0.5 mg total) by mouth every 8 (eight) hours.   losartan 100 MG tablet Commonly known as: COZAAR Take 1 tablet (100 mg total) by mouth daily.   magic mouthwash Soln Swish 15ml for 5 minutes then spit out.   metoprolol succinate 50 MG 24 hr tablet Commonly known as: TOPROL-XL Take 1 tablet (50 mg total) by mouth daily.   mupirocin ointment 2 % Commonly known as: Bactroban Place 1 application into the nose 2 (two) times daily.   phenazopyridine 100 MG tablet Commonly known as: PYRIDIUM Take 1 tablet (100 mg total) by mouth 3 (three) times daily as needed for pain.  Phospha 250 Neutral [id number] MG Tabs TAKE 1 TABLET BY MOUTH THREE TIMES A DAY   predniSONE 20 MG tablet Commonly known as: DELTASONE TAKE 3 TABLETS (60 MG TOTAL) BY MOUTH DAILY. TAKE ON DAYS 1-5 OF CHEMOTHERAPY.   prochlorperazine 10 MG tablet Commonly known as: COMPAZINE Take 10 mg by mouth every 6 (six) hours as needed for nausea or vomiting.   RITUXIMAB IV Inject into the vein every 21 ( twenty-one) days.   tamsulosin 0.4 MG Caps capsule Commonly known as: FLOMAX Take 1 capsule (0.4 mg total) by mouth  daily after supper.   vinCRIStine 2 mg in sodium chloride 0.9 % 50 mL Inject 2 mg into the vein every 21 ( twenty-one) days.       Allergies: No Known Allergies  Family History: Family History  Problem Relation Age of Onset  . Diabetes Brother   . Diabetes Mother   . Leukemia Brother   . Diabetes Son     Social History:  reports that he quit smoking about 20 years ago. He has never used smokeless tobacco. He reports current alcohol use of about 56.0 standard drinks of alcohol per week. He reports that he does not use drugs.  ROS: All other review of systems were reviewed and are negative except what is noted above in HPI  Physical Exam: BP 135/66   Pulse 65   Temp 98.7 F (37.1 C)   Ht 5\' 6"  (1.676 m)   Wt 188 lb (85.3 kg)   BMI 30.34 kg/m   Constitutional:  Alert and oriented, No acute distress. HEENT: [place] AT, moist mucus membranes.  Trachea midline, no masses. Cardiovascular: No clubbing, cyanosis, or edema. Respiratory: Normal respiratory effort, no increased work of breathing. GI: Abdomen is soft, nontender, nondistended, no abdominal masses GU: No CVA tenderness Lymph: No cervical or inguinal lymphadenopathy. Skin: No rashes, bruises or suspicious lesions. Neurologic: Grossly intact, no focal deficits, moving all 4 extremities. Psychiatric: Normal mood and affect.  Laboratory Data: Lab Results  Component Value Date   WBC 9.8 09/20/2019   HGB 8.2 (L) 09/20/2019   HCT 27.8 (L) 09/20/2019   MCV 91.4 09/20/2019   PLT 294 09/20/2019    Lab Results  Component Value Date   CREATININE 0.54 (L) 09/20/2019    No results found for: PSA  No results found for: TESTOSTERONE  No results found for: HGBA1C  Urinalysis    Component Value Date/Time   COLORURINE AMBER (A) 08/23/2019 0806   APPEARANCEUR HAZY (A) 08/23/2019 0806   LABSPEC 1.012 08/23/2019 0806   PHURINE 6.0 08/23/2019 0806   GLUCOSEU NEGATIVE 08/23/2019 0806   HGBUR LARGE (A) 08/23/2019 0806    BILIRUBINUR NEGATIVE 08/23/2019 0806   KETONESUR NEGATIVE 08/23/2019 0806   PROTEINUR 100 (A) 08/23/2019 0806   NITRITE POSITIVE (A) 08/23/2019 0806   LEUKOCYTESUR NEGATIVE 08/23/2019 0806    Lab Results  Component Value Date   BACTERIA NONE SEEN 08/23/2019    Pertinent Imaging: PET scan images reviewed and discussed with the patient No results found for this or any previous visit. Results for orders placed during the hospital encounter of 04/17/19  US Venous Img Lower Bilateral   Narrative CLINICAL DATA:  Bilateral lower extremity pain and edema for the past week. Shortness of breath. Patient is on heparin. Evaluate for DVT.  EXAM: BILATERAL LOWER EXTREMITY VENOUS DOPPLER ULTRASOUND  TECHNIQUE: Gray-scale sonography with graded compression, as well as color Doppler and duplex ultrasound were performed to evaluate the lower extremity  deep venous systems from the level of the common femoral vein and including the common femoral, femoral, profunda femoral, popliteal and calf veins including the posterior tibial, peroneal and gastrocnemius veins when visible. The superficial great saphenous vein was also interrogated. Spectral Doppler was utilized to evaluate flow at rest and with distal augmentation maneuvers in the common femoral, femoral and popliteal veins.  COMPARISON:  Nuclear medicine V/Q scan-04/17/2019; chest radiograph-earlier same day  FINDINGS: RIGHT LOWER EXTREMITY  There is hypoechoic expansile near occlusive thrombus within the right common femoral vein (image 3), extending to involve the saphenofemoral junction (image 9).  There is hypoechoic near occlusive thrombus involving the proximal aspect of the right deep femoral vein (image 12).  There is hypoechoic occlusive thrombus involving the proximal (image 14) mid (image 17) and distal (image 22) aspects of the right femoral vein.  There is hypoechoic nonocclusive thrombus within the right  popliteal vein (image 27 extending to involve the right posterior tibial (image 31) and peroneal (image 33) veins. The anterior tibial vein appears patent where imaged.  Other Findings:  None.  _________________________________________________________  LEFT LOWER EXTREMITY  Common Femoral Vein: No evidence of thrombus. Normal compressibility, respiratory phasicity and response to augmentation.  Saphenofemoral Junction: No evidence of thrombus. Normal compressibility and flow on color Doppler imaging.  Profunda Femoral Vein: No evidence of thrombus. Normal compressibility and flow on color Doppler imaging.  Femoral Vein: No evidence of thrombus. Normal compressibility, respiratory phasicity and response to augmentation.  Popliteal Vein: No evidence of thrombus. Normal compressibility, respiratory phasicity and response to augmentation.  Calf Veins: There is hypoechoic occlusive expansile thrombus within the left posterior tibial (image 72) as well as the left peroneal (image 75) veins. The anterior tibial vein appears patent where imaged.  Superficial Great Saphenous Vein: No evidence of thrombus. Normal compressibility.  Other Findings: There is an approximately 2.8 x 3.2 x 2.7 cm hypoechoic nodule within the left groin which appears to demonstrate internal blood flow and may represent a pathologically enlarged left inguinal lymph node.  IMPRESSION: 1. The examination is positive for rather extensive predominantly occlusive right lower extremity DVT extending from the right common femoral vein through the imaged tibial veins. 2. The examination is positive for occlusive DVT involving the left posterior tibial and peroneal veins. 3. Indeterminate approximately 3.2 cm hypoechoic nodule within left groin, indeterminate though could represent a pathologically enlarged lymph node. Further evaluation with contrast-enhanced CT scan could be performed as  indicated.   Electronically Signed   By: Sandi Mariscal M.D.   On: 04/18/2019 11:19    No results found for this or any previous visit. No results found for this or any previous visit. Results for orders placed during the hospital encounter of 04/17/19  US RENAL   Narrative CLINICAL DATA:  Acute renal insufficiency.  EXAM: RENAL / URINARY TRACT ULTRASOUND COMPLETE  COMPARISON:  None.  FINDINGS: Right Kidney:  Renal measurements: 12.6 x 6.3 x 8.4 cm = volume: 347 mL. Mild left hydronephrosis identified.  Left Kidney:  Renal measurements: 13 x 6.9 x 7.2 cm = volume: 336 mL. Echogenicity within normal limits. No mass or hydronephrosis visualized.  Bladder:  The bladder is decompressed.  Other:  There is a complex mass in the right side of the pelvis/right lower quadrant with some internal blood flow.  IMPRESSION: 1. There is a complex mass in the right lower quadrant/right pelvis, with internal blood flow. A CT scan may better evaluate. Malignancy not excluded. 2. Mild  hydronephrosis associated with the right kidney, age indeterminate. Recommend clinical correlation.   Electronically Signed   By: Dorise Bullion III M.D   On: 04/18/2019 12:50    No results found for this or any previous visit. No results found for this or any previous visit. Results for orders placed during the hospital encounter of 06/22/19  CT RENAL STONE STUDY   Narrative CLINICAL DATA:  Hydronephrosis. Ureteral stents. High-grade lymphoma.  EXAM: CT ABDOMEN AND PELVIS WITHOUT CONTRAST  TECHNIQUE: Multidetector CT imaging of the abdomen and pelvis was performed following the standard protocol without IV contrast.  COMPARISON:  PET-CT scan 05/03/2019, CT 04/18/2019  FINDINGS: Lower chest: 6 mm nodule at the LEFT lung base not evident on prior. Interval resolution of the pleural fluid seen on comparison PET-CT scan  Hepatobiliary: No focal hepatic lesion. No biliary duct  dilatation. Gallbladder is normal. Common bile duct is normal.  Pancreas: Pancreas is normal. No ductal dilatation. No pancreatic inflammation.  Spleen: Normal spleen  Adrenals/urinary tract: Nodule of the LEFT adrenal gland has low attenuation consistent benign adenoma.  bilateral double-J ureteral stents. No hydronephrosis on the LEFT or the RIGHT. Stents terminate in the bladder. No calculi along course of the stents.  Bladder normal.  Stomach/Bowel: Stomach, small bowel, appendix, and cecum are normal. The colon and rectosigmoid colon are normal.  Vascular/Lymphatic: Abdominal aorta calcified.  The central mesenteric nodal mass is decreased in size compared to prior. Mass measures approximately 10.0 x 4.5 cm (image 37/2) decreased from 13.0 x 8.4 cm.  Likewise the RIGHT external iliac and operator lymphadenopathy is decreased. Example RIGHT external iliac node measuring 14 mm short axis previously measured 33 mm on CT PET 05/03/2019. No new adenopathy.  Reproductive: Prostate normal  Other: No free fluid.  Musculoskeletal: No aggressive osseous lesion.  IMPRESSION: 1. Bilateral double-J ureteral stents in place without complicating features. No hydronephrosis. 2. Reduction in RIGHT operator and iliac adenopathy adjacent to the ureteral stent. 3. Interval reduction in size of the central mesenteric nodal mass. 4. Nodule in the RIGHT lower lobe not seen on prior. Recommend attention on routine follow-up   Electronically Signed   By: Suzy Bouchard M.D.   On: 06/22/2019 16:52     Assessment & Plan:    1. Other hydronephrosis -RTC 2 days for stent removal   No follow-ups on file.  Nicolette Bang, MD  [facility] [place]

## 2019-10-04 NOTE — Patient Instructions (Signed)
Hydronephrosis  Hydronephrosis is the swelling of one or both kidneys due to a blockage that stops urine from flowing out of the body. Kidneys filter waste from the blood and produce urine. This condition can lead to kidney failure and may become life threatening if not treated promptly. What are the causes? Common causes of this condition include:  Problems that occur when a baby is developing in the womb (congenital defect). These can include problems: ? In the kidneys. ? In the tubes that drain urine from the kidneys into the bladder (ureters).  Kidney stones.  Bladder infection.  An enlarged prostate gland.  Scar tissue from a previous surgery or injury.  A blood clot.  A tumor or cyst in the abdomen or pelvis.  Cancer of the prostate, bladder, uterus, ovary, or colon. What are the signs or symptoms? Symptoms of this condition include:  Pain or discomfort in your side (flank).  Pain and swelling in your abdomen.  Nausea and vomiting.  Fever.  Pain when passing urine.  Feelings of urgency when you need to urinate.  Urinating more often than normal. In some cases, you may not have any symptoms. How is this diagnosed? This condition may be diagnosed based on:  Your symptoms and medical history.  A physical exam.  Blood and urine tests.  Imaging tests, such as an ultrasound, CT scan, or MRI.  A procedure in which a scope is inserted into the urethra and used to view parts of the urinary tract and bladder (cystoscopy). How is this treated? Treatment for this condition depends on where the blockage is, how long it has been there, and what caused it. The goal of treatment is to remove the blockage. Treatment may include:  Antibiotic medicines to treat or prevent infection.  A procedure to place a small, thin tube (stent) into a blocked ureter. The stent will keep the ureter open so that urine can drain through it.  A nonsurgical procedure that crushes kidney  stones with shock waves (extracorporeal shock wave lithotripsy).  If kidney failure occurs, treatment may include dialysis or a kidney transplant. Follow these instructions at home:   Take over-the-counter and prescription medicines only as told by your health care provider.  Rest and return to your normal activities as told by your health care provider. Ask your health care provider what activities are safe for you.  Drink enough fluid to keep your urine pale yellow.  If you were prescribed an antibiotic medicine, take it exactly as told by your health care provider. Do not stop taking the antibiotic even if you start to feel better.  Keep all follow-up visits as told by your health care provider. This is important. Contact a health care provider if:  You continue to have symptoms after treatment.  You develop new symptoms.  Your urine becomes cloudy or bloody.  You have a fever. Get help right away if:  You have severe flank or abdominal pain.  You cannot drink fluids without vomiting. Summary  Hydronephrosis is the swelling of one or both kidneys due to a blockage that stops urine from flowing out of the body.  Hydronephrosis can lead to kidney failure and may become life threatening if not treated promptly.  The goal of treatment is to treat the cause of the blockage. It may include insertion of stent into a blocked ureter, a procedure to treat kidney stones, and antibiotic medicines.  Follow your health care provider's instructions for taking care of yourself at   home, including instructions about drinking fluids, taking medicines, and limiting activities. This information is not intended to replace advice given to you by your health care provider. Make sure you discuss any questions you have with your health care provider. Document Revised: 06/28/2017 Document Reviewed: 06/28/2017 Elsevier Patient Education  2020 [employer]  

## 2019-10-05 ENCOUNTER — Encounter (HOSPITAL_COMMUNITY): Payer: Self-pay

## 2019-10-05 DIAGNOSIS — J9601 Acute respiratory failure with hypoxia: Secondary | ICD-10-CM | POA: Diagnosis not present

## 2019-10-05 DIAGNOSIS — I272 Pulmonary hypertension, unspecified: Secondary | ICD-10-CM | POA: Diagnosis not present

## 2019-10-06 ENCOUNTER — Ambulatory Visit (INDEPENDENT_AMBULATORY_CARE_PROVIDER_SITE_OTHER): Payer: BLUE CROSS/BLUE SHIELD | Admitting: Urology

## 2019-10-06 ENCOUNTER — Other Ambulatory Visit: Payer: Self-pay

## 2019-10-06 ENCOUNTER — Encounter: Payer: Self-pay | Admitting: Urology

## 2019-10-06 VITALS — BP 150/65 | HR 69 | Temp 98.4°F | Ht 66.0 in | Wt 188.0 lb

## 2019-10-06 DIAGNOSIS — N135 Crossing vessel and stricture of ureter without hydronephrosis: Secondary | ICD-10-CM

## 2019-10-06 MED ORDER — CIPROFLOXACIN HCL 500 MG PO TABS: 500.0000 mg | ORAL_TABLET | Freq: Once | ORAL | Status: DC

## 2019-10-06 NOTE — Progress Notes (Signed)
   10/06/19  CC: hydronephrosis  HPI: Mr Lipscomb is a 64yo here for stent removal. Stents were placed due to bilateral ureteral obstruction from lymphoma. PET scan shows no active disease and no hydronephrosis  There were no vitals taken for this visit. NED. A&Ox3.   No respiratory distress   Abd soft, NT, ND Normal phallus with bilateral descended testicles  Cystoscopy Procedure Note  Patient identification was confirmed, informed consent was obtained, and patient was prepped using Betadine solution.  Lidocaine jelly was administered per urethral meatus.     Pre-Procedure: - Inspection reveals a normal caliber ureteral meatus.  Procedure: The flexible cystoscope was introduced without difficulty - No urethral strictures/lesions are present. - Normal prostate  - Normal bladder neck - Bilateral ureteral orifices identified - Bladder mucosa  reveals no ulcers, tumors, or lesions - No bladder stones - No trabeculation  Bilateral ureteral stents removed intact   Post-Procedure: - Patient tolerated the procedure well  Assessment/ Plan: RTC 4 weeks with renal US  No follow-ups on file.  Nicolette Bang, MD

## 2019-10-06 NOTE — Patient Instructions (Signed)
Hydronephrosis  Hydronephrosis is the swelling of one or both kidneys due to a blockage that stops urine from flowing out of the body. Kidneys filter waste from the blood and produce urine. This condition can lead to kidney failure and may become life threatening if not treated promptly. What are the causes? Common causes of this condition include:  Problems that occur when a baby is developing in the womb (congenital defect). These can include problems: ? In the kidneys. ? In the tubes that drain urine from the kidneys into the bladder (ureters).  Kidney stones.  Bladder infection.  An enlarged prostate gland.  Scar tissue from a previous surgery or injury.  A blood clot.  A tumor or cyst in the abdomen or pelvis.  Cancer of the prostate, bladder, uterus, ovary, or colon. What are the signs or symptoms? Symptoms of this condition include:  Pain or discomfort in your side (flank).  Pain and swelling in your abdomen.  Nausea and vomiting.  Fever.  Pain when passing urine.  Feelings of urgency when you need to urinate.  Urinating more often than normal. In some cases, you may not have any symptoms. How is this diagnosed? This condition may be diagnosed based on:  Your symptoms and medical history.  A physical exam.  Blood and urine tests.  Imaging tests, such as an ultrasound, CT scan, or MRI.  A procedure in which a scope is inserted into the urethra and used to view parts of the urinary tract and bladder (cystoscopy). How is this treated? Treatment for this condition depends on where the blockage is, how long it has been there, and what caused it. The goal of treatment is to remove the blockage. Treatment may include:  Antibiotic medicines to treat or prevent infection.  A procedure to place a small, thin tube (stent) into a blocked ureter. The stent will keep the ureter open so that urine can drain through it.  A nonsurgical procedure that crushes kidney  stones with shock waves (extracorporeal shock wave lithotripsy).  If kidney failure occurs, treatment may include dialysis or a kidney transplant. Follow these instructions at home:   Take over-the-counter and prescription medicines only as told by your health care provider.  Rest and return to your normal activities as told by your health care provider. Ask your health care provider what activities are safe for you.  Drink enough fluid to keep your urine pale yellow.  If you were prescribed an antibiotic medicine, take it exactly as told by your health care provider. Do not stop taking the antibiotic even if you start to feel better.  Keep all follow-up visits as told by your health care provider. This is important. Contact a health care provider if:  You continue to have symptoms after treatment.  You develop new symptoms.  Your urine becomes cloudy or bloody.  You have a fever. Get help right away if:  You have severe flank or abdominal pain.  You cannot drink fluids without vomiting. Summary  Hydronephrosis is the swelling of one or both kidneys due to a blockage that stops urine from flowing out of the body.  Hydronephrosis can lead to kidney failure and may become life threatening if not treated promptly.  The goal of treatment is to treat the cause of the blockage. It may include insertion of stent into a blocked ureter, a procedure to treat kidney stones, and antibiotic medicines.  Follow your health care provider's instructions for taking care of yourself at   home, including instructions about drinking fluids, taking medicines, and limiting activities. This information is not intended to replace advice given to you by your health care provider. Make sure you discuss any questions you have with your health care provider. Document Revised: 06/28/2017 Document Reviewed: 06/28/2017 Elsevier Patient Education  2020 [employer]  

## 2019-10-06 NOTE — Progress Notes (Signed)
Urological Symptom Review  Patient is experiencing the following symptoms: done   Review of Systems  Gastrointestinal (upper)  : Negative for upper GI symptoms  Gastrointestinal (lower) : Negative for lower GI symptoms  Constitutional : Negative for symptoms  Skin: Negative for skin symptoms  Eyes: Negative for eye symptoms  Ear/Nose/Throat : Negative for Ear/Nose/Throat symptoms  Hematologic/Lymphatic: Negative for Hematologic/Lymphatic symptoms  Cardiovascular : Negative for cardiovascular symptoms  Respiratory : Negative for respiratory symptoms  Endocrine: Negative for endocrine symptoms  Musculoskeletal: Negative for musculoskeletal symptoms  Neurological: Negative for neurological symptoms  Psychologic: Negative for psychiatric symptoms

## 2019-10-10 ENCOUNTER — Other Ambulatory Visit (HOSPITAL_COMMUNITY): Payer: Self-pay | Admitting: Hematology

## 2019-10-16 ENCOUNTER — Other Ambulatory Visit: Payer: Self-pay | Admitting: Family Medicine

## 2019-10-16 DIAGNOSIS — I10 Essential (primary) hypertension: Secondary | ICD-10-CM

## 2019-10-20 ENCOUNTER — Ambulatory Visit: Payer: BLUE CROSS/BLUE SHIELD | Admitting: Urology

## 2019-10-25 ENCOUNTER — Other Ambulatory Visit (HOSPITAL_COMMUNITY): Payer: Self-pay

## 2019-10-25 ENCOUNTER — Encounter (HOSPITAL_COMMUNITY): Payer: Self-pay | Admitting: Hematology

## 2019-10-25 ENCOUNTER — Inpatient Hospital Stay (HOSPITAL_BASED_OUTPATIENT_CLINIC_OR_DEPARTMENT_OTHER): Payer: BLUE CROSS/BLUE SHIELD | Admitting: Hematology

## 2019-10-25 ENCOUNTER — Inpatient Hospital Stay (HOSPITAL_COMMUNITY): Payer: BLUE CROSS/BLUE SHIELD | Attending: Hematology

## 2019-10-25 ENCOUNTER — Other Ambulatory Visit: Payer: Self-pay

## 2019-10-25 VITALS — BP 152/64 | HR 53 | Temp 96.7°F | Resp 16 | Wt 196.0 lb

## 2019-10-25 DIAGNOSIS — D649 Anemia, unspecified: Secondary | ICD-10-CM | POA: Insufficient documentation

## 2019-10-25 DIAGNOSIS — C8338 Diffuse large B-cell lymphoma, lymph nodes of multiple sites: Secondary | ICD-10-CM | POA: Diagnosis not present

## 2019-10-25 DIAGNOSIS — Z7901 Long term (current) use of anticoagulants: Secondary | ICD-10-CM | POA: Insufficient documentation

## 2019-10-25 DIAGNOSIS — I82403 Acute embolism and thrombosis of unspecified deep veins of lower extremity, bilateral: Secondary | ICD-10-CM | POA: Insufficient documentation

## 2019-10-25 DIAGNOSIS — N1330 Unspecified hydronephrosis: Secondary | ICD-10-CM | POA: Diagnosis not present

## 2019-10-25 DIAGNOSIS — I10 Essential (primary) hypertension: Secondary | ICD-10-CM | POA: Insufficient documentation

## 2019-10-25 DIAGNOSIS — F419 Anxiety disorder, unspecified: Secondary | ICD-10-CM | POA: Insufficient documentation

## 2019-10-25 LAB — CBC WITH DIFFERENTIAL/PLATELET
Abs Immature Granulocytes: 0.01 10*3/uL (ref 0.00–0.07)
Basophils Absolute: 0 10*3/uL (ref 0.0–0.1)
Basophils Relative: 1 %
Eosinophils Absolute: 0.3 10*3/uL (ref 0.0–0.5)
Eosinophils Relative: 6 %
HCT: 30.1 % — ABNORMAL LOW (ref 39.0–52.0)
Hemoglobin: 9.2 g/dL — ABNORMAL LOW (ref 13.0–17.0)
Immature Granulocytes: 0 %
Lymphocytes Relative: 20 %
Lymphs Abs: 1.1 10*3/uL (ref 0.7–4.0)
MCH: 26.8 pg (ref 26.0–34.0)
MCHC: 30.6 g/dL (ref 30.0–36.0)
MCV: 87.8 fL (ref 80.0–100.0)
Monocytes Absolute: 0.7 10*3/uL (ref 0.1–1.0)
Monocytes Relative: 13 %
Neutro Abs: 3.2 10*3/uL (ref 1.7–7.7)
Neutrophils Relative %: 60 %
Platelets: 184 10*3/uL (ref 150–400)
RBC: 3.43 MIL/uL — ABNORMAL LOW (ref 4.22–5.81)
RDW: 14.3 % (ref 11.5–15.5)
WBC: 5.3 10*3/uL (ref 4.0–10.5)
nRBC: 0 % (ref 0.0–0.2)

## 2019-10-25 LAB — PHOSPHORUS: Phosphorus: 4.6 mg/dL (ref 2.5–4.6)

## 2019-10-25 LAB — URIC ACID: Uric Acid, Serum: 6 mg/dL (ref 3.7–8.6)

## 2019-10-25 LAB — COMPREHENSIVE METABOLIC PANEL
ALT: 11 U/L (ref 0–44)
AST: 11 U/L — ABNORMAL LOW (ref 15–41)
Albumin: 3.6 g/dL (ref 3.5–5.0)
Alkaline Phosphatase: 61 U/L (ref 38–126)
Anion gap: 6 (ref 5–15)
BUN: 18 mg/dL (ref 8–23)
CO2: 28 mmol/L (ref 22–32)
Calcium: 8.7 mg/dL — ABNORMAL LOW (ref 8.9–10.3)
Chloride: 106 mmol/L (ref 98–111)
Creatinine, Ser: 0.68 mg/dL (ref 0.61–1.24)
GFR calc Af Amer: >60 mL/min (ref >60)
GFR calc non Af Amer: >60 mL/min (ref >60)
Glucose, Bld: 91 mg/dL (ref 70–99)
Potassium: 3.8 mmol/L (ref 3.5–5.1)
Sodium: 140 mmol/L (ref 135–145)
Total Bilirubin: 0.4 mg/dL (ref 0.3–1.2)
Total Protein: 5.7 g/dL — ABNORMAL LOW (ref 6.5–8.1)

## 2019-10-25 LAB — IRON AND TIBC
Iron: 23 ug/dL — ABNORMAL LOW (ref 45–182)
Saturation Ratios: 8 % — ABNORMAL LOW (ref 17.9–39.5)
TIBC: 284 ug/dL (ref 250–450)
UIBC: 261 ug/dL

## 2019-10-25 LAB — FOLATE: Folate: 8.9 ng/mL (ref >5.9)

## 2019-10-25 LAB — VITAMIN B12: Vitamin B-12: 267 pg/mL (ref 180–914)

## 2019-10-25 LAB — MAGNESIUM: Magnesium: 1.8 mg/dL (ref 1.7–2.4)

## 2019-10-25 LAB — LACTATE DEHYDROGENASE: LDH: 124 U/L (ref 98–192)

## 2019-10-25 LAB — FERRITIN: Ferritin: 236 ng/mL (ref 24–336)

## 2019-10-25 MED ORDER — HEPARIN SOD (PORK) LOCK FLUSH 100 UNIT/ML IV SOLN
500.0000 [IU] | Freq: Once | INTRAVENOUS | Status: AC
  Administered 2019-10-25: 500 [IU] via INTRAVENOUS

## 2019-10-25 MED ORDER — SODIUM CHLORIDE 0.9% FLUSH
10.0000 mL | Freq: Once | INTRAVENOUS | Status: AC
  Administered 2019-10-25: 10 mL via INTRAVENOUS

## 2019-10-25 NOTE — Progress Notes (Signed)
[address] [place], [place] [address]   CLINIC:  Medical Oncology/Hematology  PCP:  Loman Brooklyn, [address] [place] [address] [phone]   REASON FOR VISIT:  Follow-up for high-grade lymphoma.  CURRENT THERAPY: Surveillance.  BRIEF ONCOLOGIC HISTORY:  Oncology History  Diffuse large B-cell lymphoma of lymph nodes of multiple regions ([place])  04/26/2019 Initial Diagnosis   Diffuse large B-cell lymphoma of lymph nodes of multiple regions ([place])   05/10/2019 -  Chemotherapy   The patient had pegfilgrastim (NEULASTA ONPRO KIT) injection 6 mg, 6 mg, Subcutaneous, Once, 1 of 1 cycle pegfilgrastim-jmdb (FULPHILA) injection 6 mg, 6 mg, Subcutaneous,  Once, 6 of 7 cycles Administration: 6 mg (05/18/2019), 6 mg (06/07/2019), 6 mg (06/26/2019), 6 mg (07/19/2019), 6 mg (08/09/2019), 6 mg (08/30/2019) DOXOrubicin (ADRIAMYCIN) 20 mg, etoposide (VEPESID) 104 mg, vinCRIStine (ONCOVIN) 0.8 mg in sodium chloride 0.9 % 500 mL chemo infusion, , Intravenous, Once, 6 of 7 cycles Administration:  (05/11/2019),  (05/31/2019),  (06/01/2019),  (05/12/2019),  (05/13/2019),  (05/14/2019),  (06/02/2019),  (06/03/2019),  (06/20/2019),  (06/21/2019),  (06/22/2019),  (07/12/2019),  (06/23/2019),  (07/13/2019),  (07/14/2019),  (07/15/2019),  (08/02/2019),  (08/03/2019),  (08/04/2019),  (08/05/2019),  (08/23/2019),  (08/24/2019),  (08/25/2019),  (08/26/2019) ondansetron (ZOFRAN) 8 mg in sodium chloride 0.9 % 50 mL IVPB, , Intravenous,  Once, 6 of 7 cycles Administration: 8 mg (05/10/2019), 8 mg (05/11/2019), 16 mg (05/17/2019), 8 mg (06/01/2019), 16 mg (06/04/2019), 8 mg (05/12/2019), 8 mg (05/13/2019), 8 mg (05/14/2019), 8 mg (06/02/2019), 8 mg (06/03/2019), 8 mg (06/20/2019), 16 mg (06/24/2019), 8 mg (06/22/2019), 8 mg (07/12/2019), 8 mg (06/23/2019), 8 mg (07/13/2019), 16 mg (07/16/2019), 8 mg (07/14/2019), 8 mg (07/15/2019), 8 mg (08/02/2019), 8 mg (08/03/2019), 8 mg (08/04/2019), 8 mg (08/05/2019), 16 mg  (08/06/2019), 8 mg (08/23/2019), 8 mg (08/24/2019), 8 mg (08/25/2019), 8 mg (08/26/2019), 16 mg (08/27/2019) cyclophosphamide (CYTOXAN) 1,560 mg in sodium chloride 0.9 % 250 mL chemo infusion, 750 mg/m2 = 1,560 mg, Intravenous,  Once, 6 of 7 cycles Administration: 1,560 mg (05/17/2019), 1,560 mg (06/04/2019), 1,560 mg (06/24/2019), 1,560 mg (07/16/2019), 1,560 mg (08/06/2019), 1,560 mg (08/27/2019) riTUXimab-pvvr (RUXIENCE) 800 mg in sodium chloride 0.9 % 250 mL (2.4242 mg/mL) infusion, 375 mg/m2 = 800 mg, Intravenous,  Once, 6 of 7 cycles Dose modification: 100 mg (original dose 375 mg/m2, Cycle 1, Reason: Other (see comments), Comment: test dose), 600 mg (original dose 375 mg/m2, Cycle 1, Reason: Other (see comments), Comment: due to previous reaction giving test dose first), 400 mg (original dose 375 mg/m2, Cycle 2, Reason: Other (see comments), Comment: remainder of day before fluid - do not bill) Administration: 800 mg (05/10/2019), 800 mg (05/31/2019), 100 mg (05/17/2019), 400 mg (06/01/2019), 800 mg (06/21/2019), 800 mg (07/12/2019), 800 mg (08/02/2019), 800 mg (08/23/2019)  for chemotherapy treatment.    05/11/2019 - 05/11/2019 Chemotherapy   The patient had DOXOrubicin (ADRIAMYCIN) chemo injection 104 mg, 50 mg/m2, Intravenous,  Once, 0 of 6 cycles palonosetron (ALOXI) injection 0.25 mg, 0.25 mg, Intravenous,  Once, 0 of 6 cycles pegfilgrastim-jmdb (FULPHILA) injection 6 mg, 6 mg, Subcutaneous,  Once, 0 of 6 cycles vinCRIStine (ONCOVIN) 2 mg in sodium chloride 0.9 % 50 mL chemo infusion, 2 mg, Intravenous,  Once, 0 of 6 cycles cyclophosphamide (CYTOXAN) 1,540 mg in sodium chloride 0.9 % 250 mL chemo infusion, 750 mg/m2, Intravenous,  Once, 0 of 6 cycles fosaprepitant (EMEND) 150 mg, dexamethasone (DECADRON) 12 mg in sodium chloride 0.9 % 145  mL IVPB, , Intravenous,  Once, 0 of 6 cycles riTUXimab-pvvr (RUXIENCE) 800 mg in sodium chloride 0.9 % 250 mL (2.4242 mg/mL) infusion, 375 mg/m2, Intravenous,  Once, 0 of  6 cycles  for chemotherapy treatment.       CANCER STAGING: Cancer Staging No matching staging information was found for the patient.   INTERVAL HISTORY:  Mr. Mascio 64 y.o. male seen for follow-up of double hit lymphoma.  Denies any fevers, night sweats or weight loss.  For the last 1 week he started back working.  He is working about 4 hours/day.  Appetite is 100%.  Energy levels are 75%.  Reports some leg swellings.  REVIEW OF SYSTEMS:  Review of Systems  Cardiovascular: Positive for leg swelling.  All other systems reviewed and are negative.    PAST MEDICAL/SURGICAL HISTORY:  Past Medical History:  Diagnosis Date  . Diffuse large B cell lymphoma ([place]) 04/26/2019  . Hyperlipidemia 02/01/2014  . Hypertension   . Leg DVT (deep venous thromboembolism), acute, bilateral ([place]) 04/18/2019  . Port-A-Cath in place 04/27/2019   Past Surgical History:  Procedure Laterality Date  . CYSTOSCOPY W/ URETERAL STENT PLACEMENT Bilateral 04/21/2019   Procedure: CYSTOSCOPY WITH RETROGRADE PYELOGRAM/URETERAL STENT PLACEMENT;  Surgeon: Cleon Gustin, MD;  Location: [facility];  Service: Urology;  Laterality: Bilateral;  . INGUINAL LYMPH NODE BIOPSY Right 04/21/2019   Procedure: INGUINAL LYMPH NODE BIOPSY;  Surgeon: Virl Cagey, MD;  Location: [facility];  Service: General;  Laterality: Right;  . PORTACATH PLACEMENT Left 05/07/2019   Procedure: INSERTION PORT-A-CATH (catheter left subclavian);  Surgeon: Virl Cagey, MD;  Location: [facility];  Service: General;  Laterality: Left;     SOCIAL HISTORY:  Social History   Socioeconomic History  . Marital status: Married    Spouse name: Not on file  . Number of children: Not on file  . Years of education: Not on file  . Highest education level: Not on file  Occupational History  . Not on file  Tobacco Use  . Smoking status: Former Smoker    Quit date: 04/27/1999    Years since quitting: 20.5  . Smokeless tobacco: Never Used    Substance and Sexual Activity  . Alcohol use: Yes    Alcohol/week: 56.0 standard drinks    Types: 56 Cans of beer per week  . Drug use: Never  . Sexual activity: Not on file  Other Topics Concern  . Not on file  Social History Narrative  . Not on file   Social Determinants of Health   Financial Resource Strain:   . Difficulty of Paying Living Expenses:   Food Insecurity:   . Worried About Charity fundraiser in the Last Year:   . Arboriculturist in the Last Year:   Transportation Needs:   . Film/video editor (Medical):   Marland Kitchen Lack of Transportation (Non-Medical):   Physical Activity:   . Days of Exercise per Week:   . Minutes of Exercise per Session:   Stress:   . Feeling of Stress :   Social Connections:   . Frequency of Communication with Friends and Family:   . Frequency of Social Gatherings with Friends and Family:   . Attends Religious Services:   . Active Member of Clubs or Organizations:   . Attends Archivist Meetings:   Marland Kitchen Marital Status:   Intimate Partner Violence:   . Fear of Current or Ex-Partner:   . Emotionally Abused:   .  Physically Abused:   . Sexually Abused:     FAMILY HISTORY:  Family History  Problem Relation Age of Onset  . Diabetes Brother   . Diabetes Mother   . Leukemia Brother   . Diabetes Son     CURRENT MEDICATIONS:  Outpatient Encounter Medications as of 10/25/2019  Medication Sig  . allopurinol (ZYLOPRIM) 300 MG tablet TAKE 1 TABLET BY MOUTH EVERY DAY  . amLODipine (NORVASC) 10 MG tablet TAKE 1 TABLET BY MOUTH EVERY DAY  . apixaban (ELIQUIS) 5 MG TABS tablet Take 1 tablet (5 mg total) by mouth 2 (two) times daily.  . ciprofloxacin (CIPRO) 500 MG tablet Take 1 tablet (500 mg total) by mouth 2 (two) times daily.  Marland Kitchen docusate sodium (COLACE) 100 MG capsule Take 100 mg by mouth 2 (two) times daily.  . K Phos Mono-Sod Phos Di & Mono (PHOSPHA 250 NEUTRAL) 155-852-130 MG TABS TAKE 1 TABLET BY MOUTH THREE TIMES A DAY  .  loratadine (CLARITIN) 10 MG tablet Take 10 mg by mouth daily.  Marland Kitchen losartan (COZAAR) 100 MG tablet TAKE 1 TABLET BY MOUTH EVERY DAY  . magic mouthwash SOLN Swish 41m for 5 minutes then spit out.  . metoprolol succinate (TOPROL-XL) 50 MG 24 hr tablet TAKE 1 TABLET BY MOUTH EVERY DAY  . tamsulosin (FLOMAX) 0.4 MG CAPS capsule Take 1 capsule (0.4 mg total) by mouth daily after supper.  . CYCLOPHOSPHAMIDE IV Inject into the vein every 21 ( twenty-one) days.  .Marland KitchenDOXORUBICIN HCL IV Inject into the vein every 21 ( twenty-one) days.  . ETOPOSIDE IV Inject into the vein.  . furosemide (LASIX) 20 MG tablet TAKE 1 TABLET BY MOUTH EVERY DAY AS NEEDED (Patient not taking: Reported on 10/25/2019)  . HYDROcodone-acetaminophen (NORCO/VICODIN) 5-325 MG tablet Take 1 tablet by mouth every 8 (eight) hours as needed for moderate pain. (Patient not taking: Reported on 10/25/2019)  . LORazepam (ATIVAN) 0.5 MG tablet TAKE 1 TABLET (0.5 MG TOTAL) BY MOUTH EVERY 8 (EIGHT) HOURS. (Patient not taking: Reported on 10/25/2019)  . mupirocin ointment (BACTROBAN) 2 % Place 1 application into the nose 2 (two) times daily. (Patient not taking: Reported on 10/06/2019)  . phenazopyridine (PYRIDIUM) 100 MG tablet Take 1 tablet (100 mg total) by mouth 3 (three) times daily as needed for pain. (Patient not taking: Reported on 10/25/2019)  . predniSONE (DELTASONE) 20 MG tablet TAKE 3 TABLETS (60 MG TOTAL) BY MOUTH DAILY. TAKE ON DAYS 1-5 OF CHEMOTHERAPY. (Patient not taking: Reported on 10/06/2019)  . prochlorperazine (COMPAZINE) 10 MG tablet Take 10 mg by mouth every 6 (six) hours as needed for nausea or vomiting.  .Marland KitchenRITUXIMAB IV Inject into the vein every 21 ( twenty-one) days.  . vinCRIStine 2 mg in sodium chloride 0.9 % 50 mL Inject 2 mg into the vein every 21 ( twenty-one) days.   Facility-Administered Encounter Medications as of 10/25/2019  Medication  . 0.9 %  sodium chloride infusion  . 0.9 %  sodium chloride infusion  . 0.9 %  sodium  chloride infusion  . 0.9 %  sodium chloride infusion  . ciprofloxacin (CIPRO) tablet 500 mg  . sodium chloride flush (NS) 0.9 % injection 10 mL  . sodium chloride flush (NS) 0.9 % injection 10 mL    ALLERGIES:  No Known Allergies   PHYSICAL EXAM:  ECOG Performance status: 1  Vitals:   10/25/19 1523  BP: (!) 152/64  Pulse: (!) 53  Resp: 16  Temp: (!) 96.7 F (  35.9 C)  SpO2: 100%   Filed Weights   10/25/19 1523  Weight: 196 lb (88.9 kg)    Physical Exam Vitals reviewed.  Constitutional:      Appearance: Normal appearance.  Cardiovascular:     Rate and Rhythm: Normal rate and regular rhythm.     Heart sounds: Normal heart sounds.  Pulmonary:     Effort: Pulmonary effort is normal.     Breath sounds: Normal breath sounds.  Abdominal:     General: There is no distension.     Palpations: Abdomen is soft. There is no mass.  Musculoskeletal:        General: No swelling.  Skin:    General: Skin is warm.  Neurological:     General: No focal deficit present.     Mental Status: He is alert and oriented to person, place, and time.  Psychiatric:        Mood and Affect: Mood normal.        Behavior: Behavior normal.      LABORATORY DATA:  I have reviewed the labs as listed.  CBC    Component Value Date/Time   WBC 5.3 10/25/2019 1416   RBC 3.43 (L) 10/25/2019 1416   HGB 9.2 (L) 10/25/2019 1416   HCT 30.1 (L) 10/25/2019 1416   PLT 184 10/25/2019 1416   MCV 87.8 10/25/2019 1416   MCH 26.8 10/25/2019 1416   MCHC 30.6 10/25/2019 1416   RDW 14.3 10/25/2019 1416   LYMPHSABS 1.1 10/25/2019 1416   MONOABS 0.7 10/25/2019 1416   EOSABS 0.3 10/25/2019 1416   BASOSABS 0.0 10/25/2019 1416   CMP Latest Ref Rng & Units 10/25/2019 09/20/2019 08/23/2019  Glucose 70 - 99 mg/dL 91 117(H) 125(H)  BUN 8 - 23 mg/dL _0 Creatinine 0.61 - 1.24 mg/dL 0.68 0.54(L) 0.61  Sodium 135 - 145 mmol/L 140 142 141  Potassium 3.5 - 5.1 mmol/L 3.8 3.8 3.6  Chloride 98 - 111 mmol/L 106  106 104  CO2 22 - 32 mmol/L _1 Calcium 8.9 - 10.3 mg/dL 8.7(L) 9.0 9.1  Total Protein 6.5 - 8.1 g/dL 5.7(L) 5.6(L) 5.6(L)  Total Bilirubin 0.3 - 1.2 mg/dL 0.4 0.3 0.4  Alkaline Phos 38 - 126 U/L 61 57 68  AST 15 - 41 U/L 11(L) 13(L) 13(L)  ALT 0 - 44 U/L _2 DIAGNOSTIC IMAGING:  I have reviewed scans and discussed with the patient.     ASSESSMENT & PLAN:   Diffuse large B-cell lymphoma of lymph nodes of multiple regions ([place]) 1.  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 -6 cycles of R-EPOCH from 05/11/2019 through 08/23/2019. -Interim PET CT scan on 07/20/2019 showed good response in the mesenteric mass and bone lesions. -Posttreatment PET scan on 09/17/2019 showed central abdominal mass with SUV 2.9, measuring 10 x 3.7 cm.  Mildly enlarged right pelvic sidewall lymph node 12 mm SUV 1.7.  Areas of multifocal bone involvement with near complete resolution.  [place] 2/3 in the five-point scale. -I have made a referral for him to see a lymphoma specialist at [facility].  Unfortunately his insurance denied.  I am seeking recommendations on further management including maintenance rituximab as there is a component of follicular lymphoma versus stem cell transplant  given double hit lymphoma. -I will reach out to lymphoma specialist at [facility] for further recommendations.  2.  Bilateral leg DVT: -Ultrasound Doppler on 04/18/2019 showed bilateral leg DVT.  He is on Eliquis without any problems.  3.  Right hydronephrosis: -Cystoscopy and bilateral JJ stent placement on 04/21/2019. -He had stent removed on 10/06/2018.  He does not have any issues with urination.  4.  Anxiety: -he will continue Ativan 0.5 mg 1 to 2 tablets daily.  5.  Hypertension: -Continue  losartan 100 mg daily, Toprol-XL 50 mg daily and Norvasc 10 mg daily.  6.  Normocytic anemia: -Hemoglobin today is 9.2 with MCV of 87.8.  We will check ferritin, iron panel, Z58 and folic acid.   Orders placed this encounter:  Orders Placed This Encounter  Procedures  . Lactate dehydrogenase  . Iron and TIBC  . Ferritin  . Vitamin B12  . Folate      Derek Jack, MD [place] [phone]

## 2019-10-25 NOTE — Assessment & Plan Note (Signed)
1.  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 -6 cycles of R-EPOCH from 05/11/2019 through 08/23/2019. -Interim PET CT scan on 07/20/2019 showed good response in the mesenteric mass and bone lesions. -Posttreatment PET scan on 09/17/2019 showed central abdominal mass with SUV 2.9, measuring 10 x 3.7 cm.  Mildly enlarged right pelvic sidewall lymph node 12 mm SUV 1.7.  Areas of multifocal bone involvement with near complete resolution.  [place] 2/3 in the five-point scale. -I have made a referral for him to see a lymphoma specialist at [facility].  Unfortunately his insurance denied.  I am seeking recommendations on further management including maintenance rituximab as there is a component of follicular lymphoma versus stem cell transplant given double hit lymphoma. -I will reach out to lymphoma specialist at [facility] for further recommendations.  2.  Bilateral leg DVT: -Ultrasound Doppler on 04/18/2019 showed bilateral leg DVT.  He is on Eliquis without any problems.  3.  Right hydronephrosis: -Cystoscopy and bilateral JJ stent placement on 04/21/2019. -He had stent removed on 10/06/2018.  He does not have any issues with urination.  4.  Anxiety: -he will continue Ativan 0.5 mg 1 to 2 tablets daily.  5.  Hypertension: -Continue losartan 100 mg daily, Toprol-XL 50 mg daily and Norvasc 10 mg daily.  6.  Normocytic anemia: -Hemoglobin today is 9.2 with MCV of 87.8.  We will check ferritin, iron panel, U76 and folic acid.

## 2019-10-25 NOTE — Progress Notes (Signed)
Labs drawn from port.  Patients port flushed without difficulty.  Good blood return noted with no bruising or swelling noted at site.  Band aid applied.  VSS with discharge and left ambulatory with no s/s of distress noted.  

## 2019-10-25 NOTE — Patient Instructions (Signed)
[facility] at [facility] Discharge Instructions  You were seen today by Dr. Katragadda. He went over your recent lab results. He will see you back in  for labs and follow up.   Thank you for choosing [facility] at [facility] to provide your oncology and hematology care.  To afford each patient quality time with our provider, please arrive at least 15 minutes before your scheduled appointment time.   If you have a lab appointment with the Cancer Center please come in thru the  Main Entrance and check in at the main information desk  You need to re-schedule your appointment should you arrive 10 or more minutes late.  We strive to give you quality time with our providers, and arriving late affects you and other patients whose appointments are after yours.  Also, if you no show three or more times for appointments you may be dismissed from the clinic at the providers discretion.     Again, thank you for choosing [facility].  Our hope is that these requests will decrease the amount of time that you wait before being seen by our physicians.       _____________________________________________________________  Should you have questions after your visit to [facility], please contact our office at [phone] between the hours of 8:00 a.m. and 4:30 p.m.  Voicemails left after 4:00 p.m. will not be returned until the following business day.  For prescription refill requests, have your pharmacy contact our office and allow 72 hours.    Cancer Center Support Programs:   > Cancer Support Group  2nd Tuesday of the month 1pm-2pm, Journey Room    

## 2019-10-26 ENCOUNTER — Encounter (HOSPITAL_COMMUNITY): Payer: Self-pay

## 2019-10-26 ENCOUNTER — Encounter (HOSPITAL_COMMUNITY): Payer: Self-pay | Admitting: Hematology

## 2019-10-26 ENCOUNTER — Encounter (HOSPITAL_COMMUNITY): Payer: Self-pay | Admitting: *Deleted

## 2019-10-27 DIAGNOSIS — C8338 Diffuse large B-cell lymphoma, lymph nodes of multiple sites: Secondary | ICD-10-CM | POA: Diagnosis not present

## 2019-10-27 DIAGNOSIS — C8258 Diffuse follicle center lymphoma, lymph nodes of multiple sites: Secondary | ICD-10-CM | POA: Diagnosis not present

## 2019-10-28 DIAGNOSIS — C8290 Follicular lymphoma, unspecified, unspecified site: Secondary | ICD-10-CM | POA: Insufficient documentation

## 2019-10-28 DIAGNOSIS — C8335 Diffuse large B-cell lymphoma, lymph nodes of inguinal region and lower limb: Secondary | ICD-10-CM | POA: Diagnosis not present

## 2019-11-04 DIAGNOSIS — I272 Pulmonary hypertension, unspecified: Secondary | ICD-10-CM | POA: Diagnosis not present

## 2019-11-04 DIAGNOSIS — J9601 Acute respiratory failure with hypoxia: Secondary | ICD-10-CM | POA: Diagnosis not present

## 2019-11-05 ENCOUNTER — Other Ambulatory Visit: Payer: Self-pay

## 2019-11-05 ENCOUNTER — Ambulatory Visit (HOSPITAL_COMMUNITY)
Admission: RE | Admit: 2019-11-05 | Discharge: 2019-11-05 | Disposition: A | Discharge status: Home / Self Care | Payer: BLUE CROSS/BLUE SHIELD | Source: Ambulatory Visit | Attending: Urology | Admitting: Urology

## 2019-11-05 DIAGNOSIS — N135 Crossing vessel and stricture of ureter without hydronephrosis: Secondary | ICD-10-CM | POA: Diagnosis not present

## 2019-11-05 DIAGNOSIS — N1330 Unspecified hydronephrosis: Secondary | ICD-10-CM | POA: Diagnosis not present

## 2019-11-10 ENCOUNTER — Ambulatory Visit: Payer: BLUE CROSS/BLUE SHIELD | Admitting: Urology

## 2019-11-10 ENCOUNTER — Other Ambulatory Visit: Payer: Self-pay

## 2019-11-10 ENCOUNTER — Encounter: Payer: Self-pay | Admitting: Urology

## 2019-11-10 VITALS — BP 147/74 | HR 58 | Temp 98.4°F | Ht 66.0 in | Wt 195.0 lb

## 2019-11-10 DIAGNOSIS — N1339 Other hydronephrosis: Secondary | ICD-10-CM

## 2019-11-10 NOTE — Progress Notes (Signed)

## 2019-11-10 NOTE — Patient Instructions (Signed)
Hydronephrosis  Hydronephrosis is the swelling of one or both kidneys due to a blockage that stops urine from flowing out of the body. Kidneys filter waste from the blood and produce urine. This condition can lead to kidney failure and may become life threatening if not treated promptly. What are the causes? Common causes of this condition include:  Problems that occur when a baby is developing in the womb (congenital defect). These can include problems: ? In the kidneys. ? In the tubes that drain urine from the kidneys into the bladder (ureters).  Kidney stones.  Bladder infection.  An enlarged prostate gland.  Scar tissue from a previous surgery or injury.  A blood clot.  A tumor or cyst in the abdomen or pelvis.  Cancer of the prostate, bladder, uterus, ovary, or colon. What are the signs or symptoms? Symptoms of this condition include:  Pain or discomfort in your side (flank).  Pain and swelling in your abdomen.  Nausea and vomiting.  Fever.  Pain when passing urine.  Feelings of urgency when you need to urinate.  Urinating more often than normal. In some cases, you may not have any symptoms. How is this diagnosed? This condition may be diagnosed based on:  Your symptoms and medical history.  A physical exam.  Blood and urine tests.  Imaging tests, such as an ultrasound, CT scan, or MRI.  A procedure in which a scope is inserted into the urethra and used to view parts of the urinary tract and bladder (cystoscopy). How is this treated? Treatment for this condition depends on where the blockage is, how long it has been there, and what caused it. The goal of treatment is to remove the blockage. Treatment may include:  Antibiotic medicines to treat or prevent infection.  A procedure to place a small, thin tube (stent) into a blocked ureter. The stent will keep the ureter open so that urine can drain through it.  A nonsurgical procedure that crushes kidney  stones with shock waves (extracorporeal shock wave lithotripsy).  If kidney failure occurs, treatment may include dialysis or a kidney transplant. Follow these instructions at home:   Take over-the-counter and prescription medicines only as told by your health care provider.  Rest and return to your normal activities as told by your health care provider. Ask your health care provider what activities are safe for you.  Drink enough fluid to keep your urine pale yellow.  If you were prescribed an antibiotic medicine, take it exactly as told by your health care provider. Do not stop taking the antibiotic even if you start to feel better.  Keep all follow-up visits as told by your health care provider. This is important. Contact a health care provider if:  You continue to have symptoms after treatment.  You develop new symptoms.  Your urine becomes cloudy or bloody.  You have a fever. Get help right away if:  You have severe flank or abdominal pain.  You cannot drink fluids without vomiting. Summary  Hydronephrosis is the swelling of one or both kidneys due to a blockage that stops urine from flowing out of the body.  Hydronephrosis can lead to kidney failure and may become life threatening if not treated promptly.  The goal of treatment is to treat the cause of the blockage. It may include insertion of stent into a blocked ureter, a procedure to treat kidney stones, and antibiotic medicines.  Follow your health care provider's instructions for taking care of yourself at   home, including instructions about drinking fluids, taking medicines, and limiting activities. This information is not intended to replace advice given to you by your health care provider. Make sure you discuss any questions you have with your health care provider. Document Revised: 06/28/2017 Document Reviewed: 06/28/2017 Elsevier Patient Education  2020 [employer]  

## 2019-11-10 NOTE — Progress Notes (Signed)
11/10/2019 4:15 PM   Kevin [name] 1956-01-13 [id number]  Referring provider: Loman [place], [name],  [place] [address]  hydronephrosis  HPI: [name] is a 64yo here for followup for bilateral hydronephrosis. Stents removed 1 month ago. Renal US from 5/7 shows no hydronephrosis. Patient urinating well   PMH: Past Medical History:  Diagnosis Date  . Diffuse large B cell lymphoma ([place]) 04/26/2019  . Hyperlipidemia 02/01/2014  . Hypertension   . Leg DVT (deep venous thromboembolism), acute, bilateral ([place]) 04/18/2019  . Port-A-Cath in place 04/27/2019    Surgical History: Past Surgical History:  Procedure Laterality Date  . CYSTOSCOPY W/ URETERAL STENT PLACEMENT Bilateral 04/21/2019   Procedure: CYSTOSCOPY WITH RETROGRADE PYELOGRAM/URETERAL STENT PLACEMENT;  Surgeon: Cleon Gustin, MD;  Location: [facility];  Service: Urology;  Laterality: Bilateral;  . INGUINAL LYMPH NODE BIOPSY Right 04/21/2019   Procedure: INGUINAL LYMPH NODE BIOPSY;  Surgeon: Virl Cagey, MD;  Location: [facility];  Service: General;  Laterality: Right;  . PORTACATH PLACEMENT Left 05/07/2019   Procedure: INSERTION PORT-A-CATH (catheter left subclavian);  Surgeon: Virl Cagey, MD;  Location: [facility];  Service: General;  Laterality: Left;    Home Medications:  Allergies as of 11/10/2019   No Known Allergies     Medication List       Accurate as of Nov 10, 2019  4:15 PM. If you have any questions, ask your nurse or doctor.        allopurinol 300 MG tablet Commonly known as: ZYLOPRIM TAKE 1 TABLET BY MOUTH EVERY DAY   amLODipine 10 MG tablet Commonly known as: NORVASC TAKE 1 TABLET BY MOUTH EVERY DAY   amLODipine 10 MG tablet Commonly known as: NORVASC daily.   apixaban 5 MG Tabs tablet Commonly known as: ELIQUIS Take 1 tablet (5 mg total) by mouth 2 (two) times daily.   apixaban 5 MG Tabs tablet Commonly known as: ELIQUIS Take by mouth.     ciprofloxacin 500 MG tablet Commonly known as: Cipro Take 1 tablet (500 mg total) by mouth 2 (two) times daily.   CYCLOPHOSPHAMIDE IV Inject into the vein every 21 ( twenty-one) days.   docusate sodium 100 MG capsule Commonly known as: COLACE Take 100 mg by mouth 2 (two) times daily.   DOXORUBICIN HCL IV Inject into the vein every 21 ( twenty-one) days.   ETOPOSIDE IV Inject into the vein.   furosemide 20 MG tablet Commonly known as: LASIX TAKE 1 TABLET BY MOUTH EVERY DAY AS NEEDED   furosemide 20 MG tablet Commonly known as: LASIX daily as needed.   HYDROcodone-acetaminophen 5-325 MG tablet Commonly known as: NORCO/VICODIN Take 1 tablet by mouth every 8 (eight) hours as needed for moderate pain.   loratadine 10 MG tablet Commonly known as: CLARITIN Take 10 mg by mouth daily.   LORazepam 0.5 MG tablet Commonly known as: ATIVAN TAKE 1 TABLET (0.5 MG TOTAL) BY MOUTH EVERY 8 (EIGHT) HOURS.   losartan 100 MG tablet Commonly known as: COZAAR TAKE 1 TABLET BY MOUTH EVERY DAY   magic mouthwash Soln Swish 87ml for 5 minutes then spit out.   metoprolol succinate 50 MG 24 hr tablet Commonly known as: TOPROL-XL TAKE 1 TABLET BY MOUTH EVERY DAY   mupirocin ointment 2 % Commonly known as: Bactroban Place 1 application into the nose 2 (two) times daily.   phenazopyridine 100 MG tablet Commonly known as: PYRIDIUM Take 1 tablet (100 mg total) by mouth  3 (three) times daily as needed for pain.   Phospha 250 Neutral [id number] MG Tabs TAKE 1 TABLET BY MOUTH THREE TIMES A DAY   Phospha 250 Neutral [id number] MG Tabs in the morning and at bedtime.   predniSONE 20 MG tablet Commonly known as: DELTASONE TAKE 3 TABLETS (60 MG TOTAL) BY MOUTH DAILY. TAKE ON DAYS 1-5 OF CHEMOTHERAPY.   prochlorperazine 10 MG tablet Commonly known as: COMPAZINE Take 10 mg by mouth every 6 (six) hours as needed for nausea or vomiting.   RITUXIMAB IV Inject into the vein every 21 (  twenty-one) days.   tamsulosin 0.4 MG Caps capsule Commonly known as: FLOMAX Take 1 capsule (0.4 mg total) by mouth daily after supper.   vinCRIStine 2 mg in sodium chloride 0.9 % 50 mL Inject 2 mg into the vein every 21 ( twenty-one) days.       Allergies: No Known Allergies  Family History: Family History  Problem Relation Age of Onset  . Diabetes Brother   . Diabetes Mother   . Leukemia Brother   . Diabetes Son     Social History:  reports that he quit smoking about 20 years ago. He has never used smokeless tobacco. He reports current alcohol use of about 56.0 standard drinks of alcohol per week. He reports that he does not use drugs.  ROS: All other review of systems were reviewed and are negative except what is noted above in HPI  Physical Exam: BP (!) 147/74   Pulse (!) 58   Temp 98.4 F (36.9 C)   Ht 5\' 6"  (1.676 m)   Wt 195 lb (88.5 kg)   BMI 31.47 kg/m   Constitutional:  Alert and oriented, No acute distress. HEENT: [place] AT, moist mucus membranes.  Trachea midline, no masses. Cardiovascular: No clubbing, cyanosis, or edema. Respiratory: Normal respiratory effort, no increased work of breathing. GI: Abdomen is soft, nontender, nondistended, no abdominal masses GU: No CVA tenderness.  Lymph: No cervical or inguinal lymphadenopathy. Skin: No rashes, bruises or suspicious lesions. Neurologic: Grossly intact, no focal deficits, moving all 4 extremities. Psychiatric: Normal mood and affect.  Laboratory Data: Lab Results  Component Value Date   WBC 5.3 10/25/2019   HGB 9.2 (L) 10/25/2019   HCT 30.1 (L) 10/25/2019   MCV 87.8 10/25/2019   PLT 184 10/25/2019    Lab Results  Component Value Date   CREATININE 0.68 10/25/2019    No results found for: PSA  No results found for: TESTOSTERONE  No results found for: HGBA1C  Urinalysis    Component Value Date/Time   COLORURINE AMBER (A) 08/23/2019 0806   APPEARANCEUR HAZY (A) 08/23/2019 0806   LABSPEC  1.012 08/23/2019 0806   PHURINE 6.0 08/23/2019 0806   GLUCOSEU NEGATIVE 08/23/2019 0806   HGBUR LARGE (A) 08/23/2019 0806   BILIRUBINUR NEGATIVE 08/23/2019 0806   KETONESUR NEGATIVE 08/23/2019 0806   PROTEINUR 100 (A) 08/23/2019 0806   NITRITE POSITIVE (A) 08/23/2019 0806   LEUKOCYTESUR NEGATIVE 08/23/2019 0806    Lab Results  Component Value Date   BACTERIA NONE SEEN 08/23/2019    Pertinent Imaging: Renal US 5/7: images reviewed and discussed with the patient No results found for this or any previous visit. Results for orders placed during the hospital encounter of 04/17/19  US Venous Img Lower Bilateral   Narrative CLINICAL DATA:  Bilateral lower extremity pain and edema for the past week. Shortness of breath. Patient is on heparin. Evaluate for DVT.  EXAM: BILATERAL  LOWER EXTREMITY VENOUS DOPPLER ULTRASOUND  TECHNIQUE: Gray-scale sonography with graded compression, as well as color Doppler and duplex ultrasound were performed to evaluate the lower extremity deep venous systems from the level of the common femoral vein and including the common femoral, femoral, profunda femoral, popliteal and calf veins including the posterior tibial, peroneal and gastrocnemius veins when visible. The superficial great saphenous vein was also interrogated. Spectral Doppler was utilized to evaluate flow at rest and with distal augmentation maneuvers in the common femoral, femoral and popliteal veins.  COMPARISON:  Nuclear medicine V/Q scan-04/17/2019; chest radiograph-earlier same day  FINDINGS: RIGHT LOWER EXTREMITY  There is hypoechoic expansile near occlusive thrombus within the right common femoral vein (image 3), extending to involve the saphenofemoral junction (image 9).  There is hypoechoic near occlusive thrombus involving the proximal aspect of the right deep femoral vein (image 12).  There is hypoechoic occlusive thrombus involving the proximal (image 14) mid (image 17)  and distal (image 22) aspects of the right femoral vein.  There is hypoechoic nonocclusive thrombus within the right popliteal vein (image 27 extending to involve the right posterior tibial (image 31) and peroneal (image 33) veins. The anterior tibial vein appears patent where imaged.  Other Findings:  None.  _________________________________________________________  LEFT LOWER EXTREMITY  Common Femoral Vein: No evidence of thrombus. Normal compressibility, respiratory phasicity and response to augmentation.  Saphenofemoral Junction: No evidence of thrombus. Normal compressibility and flow on color Doppler imaging.  Profunda Femoral Vein: No evidence of thrombus. Normal compressibility and flow on color Doppler imaging.  Femoral Vein: No evidence of thrombus. Normal compressibility, respiratory phasicity and response to augmentation.  Popliteal Vein: No evidence of thrombus. Normal compressibility, respiratory phasicity and response to augmentation.  Calf Veins: There is hypoechoic occlusive expansile thrombus within the left posterior tibial (image 72) as well as the left peroneal (image 75) veins. The anterior tibial vein appears patent where imaged.  Superficial Great Saphenous Vein: No evidence of thrombus. Normal compressibility.  Other Findings: There is an approximately 2.8 x 3.2 x 2.7 cm hypoechoic nodule within the left groin which appears to demonstrate internal blood flow and may represent a pathologically enlarged left inguinal lymph node.  IMPRESSION: 1. The examination is positive for rather extensive predominantly occlusive right lower extremity DVT extending from the right common femoral vein through the imaged tibial veins. 2. The examination is positive for occlusive DVT involving the left posterior tibial and peroneal veins. 3. Indeterminate approximately 3.2 cm hypoechoic nodule within left groin, indeterminate though could represent a  pathologically enlarged lymph node. Further evaluation with contrast-enhanced CT scan could be performed as indicated.   Electronically Signed   By: Sandi Mariscal M.D.   On: 04/18/2019 11:19    No results found for this or any previous visit. No results found for this or any previous visit. Results for orders placed during the hospital encounter of 11/05/19  Ultrasound renal complete   Narrative CLINICAL DATA:  Hydronephrosis.  EXAM: RENAL / URINARY TRACT ULTRASOUND COMPLETE  COMPARISON:  June 22, 2019.  April 18, 2019.  FINDINGS: Right Kidney:  Renal measurements: 11.0 x 6.4 x 5.3 cm = volume: 195 mL . Echogenicity within normal limits. No mass or hydronephrosis visualized.  Left Kidney:  Renal measurements: 11.3 x 6.5 x 6.0 cm = volume: 236 mL. Echogenicity within normal limits. No mass or hydronephrosis visualized.  Bladder:  Appears normal for degree of bladder distention. Bilateral ureteral jets are noted. Calculated prevoid volume of 86  mL.  Other:  None.  IMPRESSION: Normal renal ultrasound.   Electronically Signed   By: Marijo Conception M.D.   On: 11/05/2019 15:47    No results found for this or any previous visit. No results found for this or any previous visit. Results for orders placed during the hospital encounter of 06/22/19  CT RENAL STONE STUDY   Narrative CLINICAL DATA:  Hydronephrosis. Ureteral stents. High-grade lymphoma.  EXAM: CT ABDOMEN AND PELVIS WITHOUT CONTRAST  TECHNIQUE: Multidetector CT imaging of the abdomen and pelvis was performed following the standard protocol without IV contrast.  COMPARISON:  PET-CT scan 05/03/2019, CT 04/18/2019  FINDINGS: Lower chest: 6 mm nodule at the LEFT lung base not evident on prior. Interval resolution of the pleural fluid seen on comparison PET-CT scan  Hepatobiliary: No focal hepatic lesion. No biliary duct dilatation. Gallbladder is normal. Common bile duct is  normal.  Pancreas: Pancreas is normal. No ductal dilatation. No pancreatic inflammation.  Spleen: Normal spleen  Adrenals/urinary tract: Nodule of the LEFT adrenal gland has low attenuation consistent benign adenoma.  bilateral double-J ureteral stents. No hydronephrosis on the LEFT or the RIGHT. Stents terminate in the bladder. No calculi along course of the stents.  Bladder normal.  Stomach/Bowel: Stomach, small bowel, appendix, and cecum are normal. The colon and rectosigmoid colon are normal.  Vascular/Lymphatic: Abdominal aorta calcified.  The central mesenteric nodal mass is decreased in size compared to prior. Mass measures approximately 10.0 x 4.5 cm (image 37/2) decreased from 13.0 x 8.4 cm.  Likewise the RIGHT external iliac and operator lymphadenopathy is decreased. Example RIGHT external iliac node measuring 14 mm short axis previously measured 33 mm on CT PET 05/03/2019. No new adenopathy.  Reproductive: Prostate normal  Other: No free fluid.  Musculoskeletal: No aggressive osseous lesion.  IMPRESSION: 1. Bilateral double-J ureteral stents in place without complicating features. No hydronephrosis. 2. Reduction in RIGHT operator and iliac adenopathy adjacent to the ureteral stent. 3. Interval reduction in size of the central mesenteric nodal mass. 4. Nodule in the RIGHT lower lobe not seen on prior. Recommend attention on routine follow-up   Electronically Signed   By: Suzy Bouchard M.D.   On: 06/22/2019 16:52     Assessment & Plan:    1. Other hydronephrosis -resolved, RTC 3 months with a renal US   No follow-ups on file.  Nicolette Bang, MD  [facility] [place]

## 2019-11-24 ENCOUNTER — Inpatient Hospital Stay (HOSPITAL_COMMUNITY): Payer: BLUE CROSS/BLUE SHIELD

## 2019-11-24 ENCOUNTER — Other Ambulatory Visit: Payer: Self-pay

## 2019-11-24 ENCOUNTER — Inpatient Hospital Stay (HOSPITAL_COMMUNITY): Payer: BLUE CROSS/BLUE SHIELD | Attending: Hematology | Admitting: Hematology

## 2019-11-24 VITALS — BP 153/57 | HR 48 | Temp 97.1°F | Resp 18 | Wt 197.0 lb

## 2019-11-24 DIAGNOSIS — I10 Essential (primary) hypertension: Secondary | ICD-10-CM | POA: Diagnosis not present

## 2019-11-24 DIAGNOSIS — Z87891 Personal history of nicotine dependence: Secondary | ICD-10-CM | POA: Insufficient documentation

## 2019-11-24 DIAGNOSIS — M7989 Other specified soft tissue disorders: Secondary | ICD-10-CM | POA: Diagnosis not present

## 2019-11-24 DIAGNOSIS — I82403 Acute embolism and thrombosis of unspecified deep veins of lower extremity, bilateral: Secondary | ICD-10-CM | POA: Insufficient documentation

## 2019-11-24 DIAGNOSIS — Z7901 Long term (current) use of anticoagulants: Secondary | ICD-10-CM | POA: Diagnosis not present

## 2019-11-24 DIAGNOSIS — C8338 Diffuse large B-cell lymphoma, lymph nodes of multiple sites: Secondary | ICD-10-CM | POA: Diagnosis not present

## 2019-11-24 DIAGNOSIS — F419 Anxiety disorder, unspecified: Secondary | ICD-10-CM | POA: Insufficient documentation

## 2019-11-24 LAB — COMPREHENSIVE METABOLIC PANEL
ALT: 13 U/L (ref 0–44)
AST: 13 U/L — ABNORMAL LOW (ref 15–41)
Albumin: 4.6 g/dL (ref 3.5–5.0)
Alkaline Phosphatase: 76 U/L (ref 38–126)
Anion gap: 8 (ref 5–15)
BUN: 15 mg/dL (ref 8–23)
CO2: 29 mmol/L (ref 22–32)
Calcium: 9.3 mg/dL (ref 8.9–10.3)
Chloride: 101 mmol/L (ref 98–111)
Creatinine, Ser: 0.73 mg/dL (ref 0.61–1.24)
GFR calc Af Amer: >60 mL/min (ref >60)
GFR calc non Af Amer: >60 mL/min (ref >60)
Glucose, Bld: 98 mg/dL (ref 70–99)
Potassium: 4.3 mmol/L (ref 3.5–5.1)
Sodium: 138 mmol/L (ref 135–145)
Total Bilirubin: 0.5 mg/dL (ref 0.3–1.2)
Total Protein: 6.2 g/dL — ABNORMAL LOW (ref 6.5–8.1)

## 2019-11-24 LAB — CBC WITH DIFFERENTIAL/PLATELET
Abs Immature Granulocytes: 0.01 10*3/uL (ref 0.00–0.07)
Basophils Absolute: 0 10*3/uL (ref 0.0–0.1)
Basophils Relative: 1 %
Eosinophils Absolute: 0.4 10*3/uL (ref 0.0–0.5)
Eosinophils Relative: 7 %
HCT: 35.7 % — ABNORMAL LOW (ref 39.0–52.0)
Hemoglobin: 11 g/dL — ABNORMAL LOW (ref 13.0–17.0)
Immature Granulocytes: 0 %
Lymphocytes Relative: 24 %
Lymphs Abs: 1.3 10*3/uL (ref 0.7–4.0)
MCH: 27.2 pg (ref 26.0–34.0)
MCHC: 30.8 g/dL (ref 30.0–36.0)
MCV: 88.1 fL (ref 80.0–100.0)
Monocytes Absolute: 0.7 10*3/uL (ref 0.1–1.0)
Monocytes Relative: 13 %
Neutro Abs: 2.9 10*3/uL (ref 1.7–7.7)
Neutrophils Relative %: 55 %
Platelets: 200 10*3/uL (ref 150–400)
RBC: 4.05 MIL/uL — ABNORMAL LOW (ref 4.22–5.81)
RDW: 13.8 % (ref 11.5–15.5)
WBC: 5.3 10*3/uL (ref 4.0–10.5)
nRBC: 0 % (ref 0.0–0.2)

## 2019-11-24 LAB — IRON AND TIBC
Iron: 32 ug/dL — ABNORMAL LOW (ref 45–182)
Saturation Ratios: 10 % — ABNORMAL LOW (ref 17.9–39.5)
TIBC: 335 ug/dL (ref 250–450)
UIBC: 303 ug/dL

## 2019-11-24 LAB — VITAMIN B12: Vitamin B-12: 239 pg/mL (ref 180–914)

## 2019-11-24 LAB — FERRITIN: Ferritin: 220 ng/mL (ref 24–336)

## 2019-11-24 LAB — LACTATE DEHYDROGENASE: LDH: 126 U/L (ref 98–192)

## 2019-11-24 NOTE — Progress Notes (Signed)
Kevin [name], [place] [address]   CLINIC:  Medical Oncology/Hematology  PCP:  Loman Brooklyn, [place] / [place] [address]  [phone]  REASON FOR VISIT:  Follow-up for high-grade lymphoma  PRIOR THERAPY: 6 cycles of R-EPOCH from 05/11/2019 through 08/23/2019.  CURRENT THERAPY: Observation  INTERVAL HISTORY:  Kevin [name], a 64 y.o. male, returns for routine follow-up for his high-grade lymphoma. Kevin [name] was last seen on 10/25/2019.  He has been working full time. He notes that his legs have been swelling up on him since he started staying on his feet more. He has been taking a 20 mg Lasix every day. He notes that he is urinating frequently as a result.  Denies any abdominal pains.  Denies any fevers, night sweats or weight loss.  REVIEW OF SYSTEMS:  Review of Systems  Cardiovascular: Positive for leg swelling.  All other systems reviewed and are negative.   PAST MEDICAL/SURGICAL HISTORY:  Past Medical History:  Diagnosis Date  . 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 Hyperlipidemia 02/01/2014  . Hypertension   . Leg DVT (deep venous thromboembolism), acute, bilateral ([place]) 04/18/2019  . Port-A-Cath in place 04/27/2019   Past Surgical History:  Procedure Laterality Date  . CYSTOSCOPY W/ URETERAL STENT PLACEMENT Bilateral 04/21/2019   Procedure: CYSTOSCOPY WITH RETROGRADE PYELOGRAM/URETERAL STENT PLACEMENT;  Surgeon: Cleon Gustin, MD;  Location: [facility];  Service: Urology;  Laterality: Bilateral;  . INGUINAL LYMPH NODE BIOPSY Right 04/21/2019   Procedure: INGUINAL LYMPH NODE BIOPSY;  Surgeon: Virl Cagey, MD;  Location: [facility];  Service: General;  Laterality: Right;  . PORTACATH PLACEMENT Left 05/07/2019   Procedure: INSERTION PORT-A-CATH (catheter left subclavian);  Surgeon: Virl Cagey, MD;  Location: [facility];  Service: General;  Laterality: Left;    SOCIAL HISTORY:  Social History    Socioeconomic History  . Marital status: Married    Spouse name: Not on file  . Number of children: Not on file  . Years of education: Not on file  . Highest education level: Not on file  Occupational History  . Not on file  Tobacco Use  . Smoking status: Former Smoker    Quit date: 04/27/1999    Years since quitting: 20.5  . Smokeless tobacco: Never Used  Substance and Sexual Activity  . Alcohol use: Yes    Alcohol/week: 56.0 standard drinks    Types: 56 Cans of beer per week  . Drug use: Never  . Sexual activity: Not on file  Other Topics Concern  . Not on file  Social History Narrative  . Not on file   Social Determinants of Health   Financial Resource Strain:   . Difficulty of Paying Living Expenses:   Food Insecurity:   . Worried About Charity fundraiser in the Last Year:   . Arboriculturist in the Last Year:   Transportation Needs:   . Film/video editor (Medical):   Marland Kitchen Lack of Transportation (Non-Medical):   Physical Activity:   . Days of Exercise per Week:   . Minutes of Exercise per Session:   Stress:   . Feeling of Stress :   Social Connections:   . Frequency of Communication with Friends and Family:   . Frequency of Social Gatherings with Friends and Family:   . Attends Religious Services:   . Active Member of Clubs or Organizations:   . Attends Club  or Organization Meetings:   Marland Kitchen Marital Status:   Intimate Partner Violence:   . Fear of Current or Ex-Partner:   . Emotionally Abused:   Marland Kitchen Physically Abused:   . Sexually Abused:     FAMILY HISTORY:  Family History  Problem Relation Age of Onset  . Diabetes Brother   . Diabetes Mother   . Leukemia Brother   . Diabetes Son     CURRENT MEDICATIONS:  Current Outpatient Medications  Medication Sig Dispense Refill  . allopurinol (ZYLOPRIM) 300 MG tablet TAKE 1 TABLET BY MOUTH EVERY DAY 90 tablet 1  . amLODipine (NORVASC) 10 MG tablet TAKE 1 TABLET BY MOUTH EVERY DAY 90 tablet 0  . apixaban  (ELIQUIS) 5 MG TABS tablet Take 1 tablet (5 mg total) by mouth 2 (two) times daily. 60 tablet 3  . docusate sodium (COLACE) 100 MG capsule Take 100 mg by mouth 2 (two) times daily.    Marland Kitchen loratadine (CLARITIN) 10 MG tablet Take 10 mg by mouth daily.    Marland Kitchen losartan (COZAAR) 100 MG tablet TAKE 1 TABLET BY MOUTH EVERY DAY 90 tablet 0  . metoprolol succinate (TOPROL-XL) 50 MG 24 hr tablet TAKE 1 TABLET BY MOUTH EVERY DAY 90 tablet 0  . phenazopyridine (PYRIDIUM) 100 MG tablet Take 1 tablet (100 mg total) by mouth 3 (three) times daily as needed for pain. 30 tablet 0  . predniSONE (DELTASONE) 20 MG tablet TAKE 3 TABLETS (60 MG TOTAL) BY MOUTH DAILY. TAKE ON DAYS 1-5 OF CHEMOTHERAPY. 150 tablet 0  . tamsulosin (FLOMAX) 0.4 MG CAPS capsule Take 1 capsule (0.4 mg total) by mouth daily after supper. 90 capsule 3  . CYCLOPHOSPHAMIDE IV Inject into the vein every 21 ( twenty-one) days.    Marland Kitchen DOXORUBICIN HCL IV Inject into the vein every 21 ( twenty-one) days.    . ETOPOSIDE IV Inject into the vein.    . furosemide (LASIX) 20 MG tablet TAKE 1 TABLET BY MOUTH EVERY DAY AS NEEDED (Patient not taking: Reported on 11/24/2019) 90 tablet 3  . LORazepam (ATIVAN) 0.5 MG tablet TAKE 1 TABLET (0.5 MG TOTAL) BY MOUTH EVERY 8 (EIGHT) HOURS. (Patient not taking: Reported on 11/24/2019) 60 tablet 2  . RITUXIMAB IV Inject into the vein every 21 ( twenty-one) days.    . vinCRIStine 2 mg in sodium chloride 0.9 % 50 mL Inject 2 mg into the vein every 21 ( twenty-one) days.     Current Facility-Administered Medications  Medication Dose Route Frequency Provider Last Rate Last Admin  . ciprofloxacin (CIPRO) tablet 500 mg  500 mg Oral Once McKenzie, Candee Furbish, MD       Facility-Administered Medications Ordered in Other Visits  Medication Dose Route Frequency Provider Last Rate Last Admin  . 0.9 %  sodium chloride infusion   Intravenous Continuous Derek Jack, MD   Stopped at 06/04/19 1304  . 0.9 %  sodium chloride infusion    Intravenous Continuous Derek Jack, MD   Stopped at 08/04/19 1225  . 0.9 %  sodium chloride infusion   Intravenous Continuous Derek Jack, MD 50 mL/hr at 08/05/19 1134 New Bag at 08/05/19 1134  . 0.9 %  sodium chloride infusion   Intravenous Continuous Derek Jack, MD 20 mL/hr at 08/26/19 1105 New Bag at 08/26/19 1105  . sodium chloride flush (NS) 0.9 % injection 10 mL  10 mL Intravenous PRN Derek Jack, MD   10 mL at 08/04/19 1155  . sodium chloride flush (NS) 0.9 %  injection 10 mL  10 mL Intravenous PRN Derek Jack, MD   10 mL at 08/26/19 1104    ALLERGIES:  No Known Allergies  PHYSICAL EXAM:  Performance status (ECOG): 1 - Symptomatic but completely ambulatory  Vitals:   11/24/19 1417  BP: (!) 153/57  Pulse: (!) 48  Resp: 18  Temp: (!) 97.1 F (36.2 C)  SpO2: 98%   Wt Readings from Last 3 Encounters:  11/24/19 197 lb (89.4 kg)  11/10/19 195 lb (88.5 kg)  10/25/19 196 lb (88.9 kg)   Physical Exam Vitals reviewed.  Constitutional:      Appearance: Normal appearance.  Cardiovascular:     Rate and Rhythm: Normal rate and regular rhythm.     Heart sounds: Normal heart sounds.  Pulmonary:     Effort: Pulmonary effort is normal.     Breath sounds: Normal breath sounds.  Abdominal:     General: There is no distension.     Palpations: Abdomen is soft. There is no mass.  Lymphadenopathy:     Cervical: No cervical adenopathy.  Skin:    General: Skin is warm.  Neurological:     General: No focal deficit present.     Mental Status: He is alert and oriented to person, place, and time.  Psychiatric:        Mood and Affect: Mood normal.        Behavior: Behavior normal.     LABORATORY DATA:  I have reviewed the labs as listed.  CBC Latest Ref Rng & Units 10/25/2019 09/20/2019 08/23/2019  WBC 4.0 - 10.5 K/uL 5.3 9.8 12.7(H)  Hemoglobin 13.0 - 17.0 g/dL 9.2(L) 8.2(L) 8.3(L)  Hematocrit 39.0 - 52.0 % 30.1(L) 27.8(L) 28.4(L)   Platelets 150 - 400 K/uL 184 294 325   CMP Latest Ref Rng & Units 10/25/2019 09/20/2019 08/23/2019  Glucose 70 - 99 mg/dL 91 117(H) 125(H)  BUN 8 - 23 mg/dL 18 20 11   Creatinine 0.61 - 1.24 mg/dL 0.68 0.54(L) 0.61  Sodium 135 - 145 mmol/L 140 142 141  Potassium 3.5 - 5.1 mmol/L 3.8 3.8 3.6  Chloride 98 - 111 mmol/L 106 106 104  CO2 22 - 32 mmol/L 28 30 29   Calcium 8.9 - 10.3 mg/dL 8.7(L) 9.0 9.1  Total Protein 6.5 - 8.1 g/dL 5.7(L) 5.6(L) 5.6(L)  Total Bilirubin 0.3 - 1.2 mg/dL 0.4 0.3 0.4  Alkaline Phos 38 - 126 U/L 61 57 68  AST 15 - 41 U/L 11(L) 13(L) 13(L)  ALT 0 - 44 U/L 11 10 10       Component Value Date/Time   RBC 3.43 (L) 10/25/2019 1416   MCV 87.8 10/25/2019 1416   MCH 26.8 10/25/2019 1416   MCHC 30.6 10/25/2019 1416   RDW 14.3 10/25/2019 1416   LYMPHSABS 1.1 10/25/2019 1416   MONOABS 0.7 10/25/2019 1416   EOSABS 0.3 10/25/2019 1416   BASOSABS 0.0 10/25/2019 1416    DIAGNOSTIC IMAGING:  I have independently reviewed the scans and discussed with the patient. Ultrasound renal complete  Result Date: 11/05/2019 CLINICAL DATA:  Hydronephrosis. EXAM: RENAL / URINARY TRACT ULTRASOUND COMPLETE COMPARISON:  June 22, 2019.  April 18, 2019. FINDINGS: Right Kidney: Renal measurements: 11.0 x 6.4 x 5.3 cm = volume: 195 mL . Echogenicity within normal limits. No mass or hydronephrosis visualized. Left Kidney: Renal measurements: 11.3 x 6.5 x 6.0 cm = volume: 236 mL. Echogenicity within normal limits. No mass or hydronephrosis visualized. Bladder: Appears normal for degree of bladder distention. Bilateral  ureteral jets are noted. Calculated prevoid volume of 86 mL. Other: None. IMPRESSION: Normal renal ultrasound. Electronically Signed   By: Marijo Conception M.D.   On: 11/05/2019 15:47     ASSESSMENT:  1.  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 -6 cycles of R-EPOCH from 05/11/2019 through 08/23/2019. -PET scan on 09/17/2019 showed central abdominal mass with SUV 2.9, measuring 10 x 3.7 cm.  Mildly enlarged right pelvic sidewall lymph node 12 mm SUV 1.7.  Areas of multifocal bone involvement with near complete resolution.  [place] 2/3 in the five-point scale.    PLAN:  1.  Double hit lymphoma: -I have sent him for opinion to lymphoma specialist at [facility] Dr. Cassell Clement.  His case was discussed at tumor board. -No adjuvant radiation therapy or maintenance rituximab or transplant was recommended. -Close surveillance was recommended. -Labs today shows normal LDH.  No palpable adenopathy.  CBC shows hemoglobin improved 11. -I plan to arrange for a PET scan in 3 weeks.  2.  Bilateral leg DVT: -Ultrasound Doppler on 04/18/2019 showed bilateral leg DVT.  He is on Eliquis. -He is questioning whether he should continue the anticoagulation. -We will make a decision based on his PET scan for his lymphoma.  We will also consider repeating his Doppler and D-dimer.  3.  Anxiety: -Continue Ativan 0.5 mg 1 to 2 tablets daily.  4.  Lower extremity swelling: -He is taking Lasix 20 mg daily.  It is not completely helping.  I have told him to increase it to 40 mg as needed.  5.  Hypertension: -Continue losartan 100 mg daily, Toprol-XL 50 mg daily and Norvasc 10 mg daily.   Orders placed this encounter:  No orders of the defined types were placed in this encounter.    Derek Jack, MD [facility] [phone]   I, Jacqualyn Posey, am acting as a scribe for Dr. Sanda Linger.  I, Derek Jack MD, have reviewed the above documentation for accuracy and completeness, and I agree with the above.

## 2019-11-24 NOTE — Patient Instructions (Signed)
[place] at [facility] Discharge Instructions  You were seen today by Dr. Delton Coombes. He went over your recent results. We will schedule you for a repeat PET scan in June. He will see you back in 1 month for labs and follow up.   Thank you for choosing [place] at [facility] to provide your oncology and hematology care.  To afford each patient quality time with our provider, please arrive at least 15 minutes before your scheduled appointment time.   If you have a lab appointment with the [facility] please come in thru the  Main Entrance and check in at the main information desk  You need to re-schedule your appointment should you arrive 10 or more minutes late.  We strive to give you quality time with our providers, and arriving late affects you and other patients whose appointments are after yours.  Also, if you no show three or more times for appointments you may be dismissed from the clinic at the providers discretion.     Again, thank you for choosing [facility].  Our hope is that these requests will decrease the amount of time that you wait before being seen by our physicians.       _____________________________________________________________  Should you have questions after your visit to [facility], please contact our office at [phone] between the hours of 8:00 a.m. and 4:30 p.m.  Voicemails left after 4:00 p.m. will not be returned until the following business day.  For prescription refill requests, have your pharmacy contact our office and allow 72 hours.    Cancer Center Support Programs:   > Cancer Support Group  2nd Tuesday of the month 1pm-2pm, Journey Room

## 2019-12-05 DIAGNOSIS — J9601 Acute respiratory failure with hypoxia: Secondary | ICD-10-CM | POA: Diagnosis not present

## 2019-12-05 DIAGNOSIS — I272 Pulmonary hypertension, unspecified: Secondary | ICD-10-CM | POA: Diagnosis not present

## 2019-12-09 ENCOUNTER — Other Ambulatory Visit: Payer: Self-pay

## 2019-12-09 ENCOUNTER — Ambulatory Visit (INDEPENDENT_AMBULATORY_CARE_PROVIDER_SITE_OTHER): Payer: BLUE CROSS/BLUE SHIELD | Admitting: Family Medicine

## 2019-12-09 ENCOUNTER — Encounter: Payer: Self-pay | Admitting: Family Medicine

## 2019-12-09 VITALS — BP 144/70 | HR 52 | Temp 98.1°F | Ht 66.0 in | Wt 199.6 lb

## 2019-12-09 DIAGNOSIS — C8338 Diffuse large B-cell lymphoma, lymph nodes of multiple sites: Secondary | ICD-10-CM | POA: Diagnosis not present

## 2019-12-09 DIAGNOSIS — G620 Drug-induced polyneuropathy: Secondary | ICD-10-CM

## 2019-12-09 DIAGNOSIS — I10 Essential (primary) hypertension: Secondary | ICD-10-CM | POA: Diagnosis not present

## 2019-12-09 DIAGNOSIS — E785 Hyperlipidemia, unspecified: Secondary | ICD-10-CM | POA: Diagnosis not present

## 2019-12-09 DIAGNOSIS — Z1211 Encounter for screening for malignant neoplasm of colon: Secondary | ICD-10-CM

## 2019-12-09 DIAGNOSIS — Z23 Encounter for immunization: Secondary | ICD-10-CM | POA: Diagnosis not present

## 2019-12-09 MED ORDER — GABAPENTIN 300 MG PO CAPS: 300.0000 mg | ORAL_CAPSULE | Freq: Every day | ORAL | 0 refills | Status: DC | PRN

## 2019-12-09 NOTE — Progress Notes (Signed)
Assessment & Plan:  1. Drug-induced polyneuropathy ([place]) - New prescription sent for gabapentin. - gabapentin (NEURONTIN) 300 MG capsule; Take 1 capsule (300 mg total) by mouth daily as needed.  Dispense: 15 capsule; Refill: 0  2. Essential hypertension - Well controlled on current regimen.   3. Diffuse large B-cell lymphoma of lymph nodes of multiple regions [facility]) - Managed by hematology/oncology.  4. Hyperlipidemia, unspecified hyperlipidemia type - Well controlled on current regimen. We are asking oncology to add lipid panel to their next blood work since they are able to draw from his port.   5. Immunization due - Pneumococcal conjugate vaccine 13-valent  6. Colon cancer screening - Ambulatory referral to Gastroenterology   Return in about 6 weeks (around 01/20/2020) for neuropathy.  Hendricks Limes, MSN, APRN, FNP-C [facility]  Subjective:    Patient ID: Kevin [name], male    DOB: [birth year], 64 y.o.   MRN: [id number]  Patient Care Team: Loman [place], FNP as PCP - General (Family Medicine)   Chief Complaint:  Chief Complaint  Patient presents with   Hypertension    6 month follow up of chronic medical condiiotns    HPI: Kevin [name] is a 64 y.o. male presenting on 12/09/2019 for Hypertension (6 month follow up of chronic medical condiiotns)  Patient has been following very closely with hematology/oncology due to lymphoma.   New complaints: Patient c/o tingling in BLE.   Social history:  Relevant past medical, surgical, family and social history reviewed and updated as indicated. Interim medical history since our last visit reviewed.  Allergies and medications reviewed and updated.  DATA REVIEWED: CHART IN EPIC  ROS: Negative unless specifically indicated above in HPI.    Current Outpatient Medications:    allopurinol (ZYLOPRIM) 300 MG tablet, TAKE 1 TABLET BY MOUTH EVERY DAY, Disp: 90 tablet, Rfl: 1   amLODipine  (NORVASC) 10 MG tablet, TAKE 1 TABLET BY MOUTH EVERY DAY, Disp: 90 tablet, Rfl: 0   apixaban (ELIQUIS) 5 MG TABS tablet, Take 1 tablet (5 mg total) by mouth 2 (two) times daily., Disp: 60 tablet, Rfl: 3   CYCLOPHOSPHAMIDE IV, Inject into the vein every 21 ( twenty-one) days., Disp: , Rfl:    docusate sodium (COLACE) 100 MG capsule, Take 100 mg by mouth 2 (two) times daily., Disp: , Rfl:    DOXORUBICIN HCL IV, Inject into the vein every 21 ( twenty-one) days., Disp: , Rfl:    ETOPOSIDE IV, Inject into the vein., Disp: , Rfl:    furosemide (LASIX) 20 MG tablet, TAKE 1 TABLET BY MOUTH EVERY DAY AS NEEDED (Patient not taking: Reported on 11/24/2019), Disp: 90 tablet, Rfl: 3   loratadine (CLARITIN) 10 MG tablet, Take 10 mg by mouth daily., Disp: , Rfl:    LORazepam (ATIVAN) 0.5 MG tablet, TAKE 1 TABLET (0.5 MG TOTAL) BY MOUTH EVERY 8 (EIGHT) HOURS. (Patient not taking: Reported on 11/24/2019), Disp: 60 tablet, Rfl: 2   losartan (COZAAR) 100 MG tablet, TAKE 1 TABLET BY MOUTH EVERY DAY, Disp: 90 tablet, Rfl: 0   metoprolol succinate (TOPROL-XL) 50 MG 24 hr tablet, TAKE 1 TABLET BY MOUTH EVERY DAY, Disp: 90 tablet, Rfl: 0   phenazopyridine (PYRIDIUM) 100 MG tablet, Take 1 tablet (100 mg total) by mouth 3 (three) times daily as needed for pain., Disp: 30 tablet, Rfl: 0   predniSONE (DELTASONE) 20 MG tablet, TAKE 3 TABLETS (60 MG TOTAL) BY MOUTH DAILY. TAKE ON DAYS 1-5 OF CHEMOTHERAPY.,  Disp: 150 tablet, Rfl: 0   RITUXIMAB IV, Inject into the vein every 21 ( twenty-one) days., Disp: , Rfl:    tamsulosin (FLOMAX) 0.4 MG CAPS capsule, Take 1 capsule (0.4 mg total) by mouth daily after supper., Disp: 90 capsule, Rfl: 3   vinCRIStine 2 mg in sodium chloride 0.9 % 50 mL, Inject 2 mg into the vein every 21 ( twenty-one) days., Disp: , Rfl:   Current Facility-Administered Medications:    ciprofloxacin (CIPRO) tablet 500 mg, 500 mg, Oral, Once, McKenzie, Candee Furbish, MD  Facility-Administered  Medications Ordered in Other Visits:    0.9 %  sodium chloride infusion, , Intravenous, Continuous, Derek Jack, MD, Stopped at 06/04/19 1304   0.9 %  sodium chloride infusion, , Intravenous, Continuous, Derek Jack, MD, Stopped at 08/04/19 1225   0.9 %  sodium chloride infusion, , Intravenous, Continuous, Derek Jack, MD, Last Rate: 50 mL/hr at 08/05/19 1134, New Bag at 08/05/19 1134   0.9 %  sodium chloride infusion, , Intravenous, Continuous, Derek Jack, MD, Last Rate: 20 mL/hr at 08/26/19 1105, New Bag at 08/26/19 1105   sodium chloride flush (NS) 0.9 % injection 10 mL, 10 mL, Intravenous, PRN, Derek Jack, MD, 10 mL at 08/04/19 1155   sodium chloride flush (NS) 0.9 % injection 10 mL, 10 mL, Intravenous, PRN, Derek Jack, MD, 10 mL at 08/26/19 1104   No Known Allergies Past Medical History:  Diagnosis Date   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   Hyperlipidemia 02/01/2014   Hypertension    Leg DVT (deep venous thromboembolism), acute, bilateral ([place]) 04/18/2019   Port-A-Cath in place 04/27/2019    Past Surgical History:  Procedure Laterality Date   CYSTOSCOPY W/ URETERAL STENT PLACEMENT Bilateral 04/21/2019   Procedure: CYSTOSCOPY WITH RETROGRADE PYELOGRAM/URETERAL STENT PLACEMENT;  Surgeon: Cleon Gustin, MD;  Location: [facility];  Service: Urology;  Laterality: Bilateral;   INGUINAL LYMPH NODE BIOPSY Right 04/21/2019   Procedure: INGUINAL LYMPH NODE BIOPSY;  Surgeon: Virl Cagey, MD;  Location: [facility];  Service: General;  Laterality: Right;   PORTACATH PLACEMENT Left 05/07/2019   Procedure: INSERTION PORT-A-CATH (catheter left subclavian);  Surgeon: Virl Cagey, MD;  Location: [facility];  Service: General;  Laterality: Left;    Social History   Socioeconomic History   Marital status: Married    Spouse name: Not on file   Number of children: Not on file   Years of education: Not on file     Highest education level: Not on file  Occupational History   Not on file  Tobacco Use   Smoking status: Former Smoker    Quit date: 04/27/1999    Years since quitting: 20.6   Smokeless tobacco: Never Used  Vaping Use   Vaping Use: Never used  Substance and Sexual Activity   Alcohol use: Yes    Alcohol/week: 56.0 standard drinks    Types: 56 Cans of beer per week   Drug use: Never   Sexual activity: Not on file  Other Topics Concern   Not on file  Social History Narrative   Not on file   Social Determinants of Health   Financial Resource Strain:    Difficulty of Paying Living Expenses:   Food Insecurity:    Worried About Charity fundraiser in the Last Year:    Arboriculturist in the Last Year:   Transportation Needs:    Film/video editor (Medical):    Lack of Transportation (  Non-Medical):   Physical Activity:    Days of Exercise per Week:    Minutes of Exercise per Session:   Stress:    Feeling of Stress :   Social Connections:    Frequency of Communication with Friends and Family:    Frequency of Social Gatherings with Friends and Family:    Attends Religious Services:    Active Member of Clubs or Organizations:    Attends Music therapist:    Marital Status:   Intimate Partner Violence:    Fear of Current or Ex-Partner:    Emotionally Abused:    Physically Abused:    Sexually Abused:         Objective:    BP (!) 144/70    Pulse (!) 52    Temp 98.1 F (36.7 C) (Temporal)    Ht 5\' 6"  (1.676 m)    Wt 199 lb 9.6 oz (90.5 kg)    SpO2 96%    BMI 32.22 kg/m   Wt Readings from Last 3 Encounters:  12/16/19 198 lb 1.6 oz (89.9 kg)  12/09/19 199 lb 9.6 oz (90.5 kg)  11/24/19 197 lb (89.4 kg)    Physical Exam Vitals reviewed.  Constitutional:      General: He is not in acute distress.    Appearance: Normal appearance. He is obese. He is not ill-appearing, toxic-appearing or diaphoretic.  HENT:     Head:  Normocephalic and atraumatic.  Eyes:     General: No scleral icterus.       Right eye: No discharge.        Left eye: No discharge.     Conjunctiva/sclera: Conjunctivae normal.  Cardiovascular:     Rate and Rhythm: Normal rate and regular rhythm.     Heart sounds: Normal heart sounds. No murmur heard.  No friction rub. No gallop.   Pulmonary:     Effort: Pulmonary effort is normal. No respiratory distress.     Breath sounds: Normal breath sounds. No stridor. No wheezing, rhonchi or rales.  Musculoskeletal:        General: Normal range of motion.     Cervical back: Normal range of motion.  Skin:    General: Skin is warm and dry.  Neurological:     Mental Status: He is alert and oriented to person, place, and time. Mental status is at baseline.  Psychiatric:        Mood and Affect: Mood normal.        Behavior: Behavior normal.        Thought Content: Thought content normal.        Judgment: Judgment normal.     No results found for: [facility] Lab Results  Component Value Date   WBC 5.3 11/24/2019   HGB 11.0 (L) 11/24/2019   HCT 35.7 (L) 11/24/2019   MCV 88.1 11/24/2019   PLT 200 11/24/2019   Lab Results  Component Value Date   NA 138 11/24/2019   K 4.3 11/24/2019   CO2 29 11/24/2019   GLUCOSE 98 11/24/2019   BUN 15 11/24/2019   CREATININE 0.73 11/24/2019   BILITOT 0.5 11/24/2019   ALKPHOS 76 11/24/2019   AST 13 (L) 11/24/2019   ALT 13 11/24/2019   PROT 6.2 (L) 11/24/2019   ALBUMIN 4.6 11/24/2019   CALCIUM 9.3 11/24/2019   ANIONGAP 8 11/24/2019   No results found for: CHOL No results found for: HDL No results found for: [facility] Lab Results  Component Value Date  TRIG 181 (H) 04/17/2019   No results found for: CHOLHDL No results found for: HGBA1C

## 2019-12-13 ENCOUNTER — Telehealth: Payer: Self-pay | Admitting: Family Medicine

## 2019-12-13 NOTE — Telephone Encounter (Signed)
Med list updated

## 2019-12-14 ENCOUNTER — Other Ambulatory Visit: Payer: Self-pay

## 2019-12-14 ENCOUNTER — Encounter: Payer: Self-pay | Admitting: Internal Medicine

## 2019-12-14 ENCOUNTER — Encounter (HOSPITAL_COMMUNITY)
Admission: RE | Admit: 2019-12-14 | Discharge: 2019-12-14 | Disposition: A | Discharge status: Home / Self Care | Payer: BLUE CROSS/BLUE SHIELD | Source: Ambulatory Visit | Attending: Hematology | Admitting: Hematology

## 2019-12-14 DIAGNOSIS — C8590 Non-Hodgkin lymphoma, unspecified, unspecified site: Secondary | ICD-10-CM | POA: Diagnosis not present

## 2019-12-14 DIAGNOSIS — C8338 Diffuse large B-cell lymphoma, lymph nodes of multiple sites: Secondary | ICD-10-CM | POA: Diagnosis not present

## 2019-12-14 DIAGNOSIS — D3502 Benign neoplasm of left adrenal gland: Secondary | ICD-10-CM | POA: Diagnosis not present

## 2019-12-14 LAB — GLUCOSE, CAPILLARY: Glucose-Capillary: 90 mg/dL (ref 70–99)

## 2019-12-14 MED ORDER — FLUDEOXYGLUCOSE F - 18 (FDG) INJECTION
10.7800 | Freq: Once | INTRAVENOUS | Status: AC | PRN
  Administered 2019-12-14: 10.78 via INTRAVENOUS

## 2019-12-16 ENCOUNTER — Inpatient Hospital Stay (HOSPITAL_COMMUNITY): Payer: BLUE CROSS/BLUE SHIELD | Attending: Hematology | Admitting: Hematology

## 2019-12-16 ENCOUNTER — Other Ambulatory Visit: Payer: Self-pay

## 2019-12-16 VITALS — BP 137/54 | HR 54 | Temp 97.1°F | Resp 18 | Wt 198.1 lb

## 2019-12-16 DIAGNOSIS — I10 Essential (primary) hypertension: Secondary | ICD-10-CM | POA: Insufficient documentation

## 2019-12-16 DIAGNOSIS — R233 Spontaneous ecchymoses: Secondary | ICD-10-CM | POA: Insufficient documentation

## 2019-12-16 DIAGNOSIS — Z7901 Long term (current) use of anticoagulants: Secondary | ICD-10-CM | POA: Diagnosis not present

## 2019-12-16 DIAGNOSIS — I82403 Acute embolism and thrombosis of unspecified deep veins of lower extremity, bilateral: Secondary | ICD-10-CM | POA: Diagnosis not present

## 2019-12-16 DIAGNOSIS — F419 Anxiety disorder, unspecified: Secondary | ICD-10-CM | POA: Diagnosis not present

## 2019-12-16 DIAGNOSIS — J3489 Other specified disorders of nose and nasal sinuses: Secondary | ICD-10-CM | POA: Diagnosis not present

## 2019-12-16 DIAGNOSIS — Z87891 Personal history of nicotine dependence: Secondary | ICD-10-CM | POA: Diagnosis not present

## 2019-12-16 DIAGNOSIS — C8338 Diffuse large B-cell lymphoma, lymph nodes of multiple sites: Secondary | ICD-10-CM | POA: Diagnosis not present

## 2019-12-16 NOTE — Patient Instructions (Signed)
[place] at [facility] Discharge Instructions  You were seen today by Dr. Delton Coombes. He went over your recent results and scans. You will be scheduled for a CT scan of your head. You will be referred to ENT Dr. Benjamine Mola. You will also be scheduled for an ultrasound scan of your legs. Keep taking your Eliquis daily. Dr. Delton Coombes will see you back in 5 weeks for labs and follow up.   Thank you for choosing [place] at [facility] to provide your oncology and hematology care.  To afford each patient quality time with our provider, please arrive at least 15 minutes before your scheduled appointment time.   If you have a lab appointment with the [place] [facility] please come in thru the Main Entrance and check in at the main information desk  You need to re-schedule your appointment should you arrive 10 or more minutes late.  We strive to give you quality time with our providers, and arriving late affects you and other patients whose appointments are after yours.  Also, if you no show three or more times for appointments you may be dismissed from the clinic at the providers discretion.     Again, thank you for choosing [facility].  Our hope is that these requests will decrease the amount of time that you wait before being seen by our physicians.       _____________________________________________________________  Should you have questions after your visit to [facility], please contact our office at [phone] between the hours of 8:00 a.m. and 4:30 p.m.  Voicemails left after 4:00 p.m. will not be returned until the following business day.  For prescription refill requests, have your pharmacy contact our office and allow 72 hours.    Cancer Center Support Programs:   > Cancer Support Group  2nd Tuesday of the month 1pm-2pm, Journey Room

## 2019-12-16 NOTE — Progress Notes (Signed)
Kevin [name], Kevin [name]   CLINIC:  Medical Oncology/Hematology  PCP:  Loman [place], [place] [address] [place] [address]  [phone]  REASON FOR VISIT:  Follow-up for high-grade  PRIOR THERAPY: 6 cycles of R-EPOCH from 05/11/2019 through 08/23/2019.  CURRENT THERAPY: Observation  INTERVAL HISTORY:  Kevin [name], a 64 y.o. 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 Kevin [name] was last seen on 11/24/2019.  Today he reports feeling well. He denies having any sinus pain or discharge or jaw/teeth pain or headaches. Likewise denies having any F/C/night sweats or weight loss or abdominal pain. He does complain of easy bruising on his hands because of his Eliquis.   REVIEW OF SYSTEMS:  Review of Systems  Constitutional: Positive for fatigue (mild). Negative for appetite change, chills, diaphoresis, fever and unexpected weight change.  Gastrointestinal: Positive for diarrhea. Negative for abdominal pain.  Neurological: Negative for headaches.  Hematological: Bruises/bleeds easily.  Psychiatric/Behavioral: Positive for depression.  All other systems reviewed and are negative.   PAST MEDICAL/SURGICAL HISTORY:  Past Medical History:  Diagnosis Date  . Diffuse large B cell lymphoma ([place]) 04/26/2019  . Hyperlipidemia 02/01/2014  . Hypertension   . Leg DVT (deep venous thromboembolism), acute, bilateral ([place]) 04/18/2019  . Port-A-Cath in place 04/27/2019   Past Surgical History:  Procedure Laterality Date  . CYSTOSCOPY W/ URETERAL STENT PLACEMENT Bilateral 04/21/2019   Procedure: CYSTOSCOPY WITH RETROGRADE PYELOGRAM/URETERAL STENT PLACEMENT;  Surgeon: Cleon Gustin, MD;  Location: [facility];  Service: Urology;  Laterality: Bilateral;  . INGUINAL LYMPH NODE BIOPSY Right 04/21/2019   Procedure: INGUINAL LYMPH NODE BIOPSY;  Surgeon: Virl Cagey, MD;  Location: [facility];  Service: General;   Laterality: Right;  . PORTACATH PLACEMENT Left 05/07/2019   Procedure: INSERTION PORT-A-CATH (catheter left subclavian);  Surgeon: Virl Cagey, MD;  Location: [facility];  Service: General;  Laterality: Left;    SOCIAL HISTORY:  Social History   Socioeconomic History  . Marital status: Married    Spouse name: Not on file  . Number of children: Not on file  . Years of education: Not on file  . Highest education level: Not on file  Occupational History  . Not on file  Tobacco Use  . Smoking status: Former Smoker    Quit date: 04/27/1999    Years since quitting: 20.6  . Smokeless tobacco: Never Used  Vaping Use  . Vaping Use: Never used  Substance and Sexual Activity  . Alcohol use: Yes    Alcohol/week: 56.0 standard drinks    Types: 56 Cans of beer per week  . Drug use: Never  . Sexual activity: Not on file  Other Topics Concern  . Not on file  Social History Narrative  . Not on file   Social Determinants of Health   Financial Resource Strain:   . Difficulty of Paying Living Expenses:   Food Insecurity:   . Worried About Charity fundraiser in the Last Year:   . Arboriculturist in the Last Year:   Transportation Needs:   . Film/video editor (Medical):   Marland Kitchen Lack of Transportation (Non-Medical):   Physical Activity:   . Days of Exercise per Week:   . Minutes of Exercise per Session:   Stress:   . Feeling of Stress :   Social Connections:   . Frequency of Communication with Friends and Family:   .  Frequency of Social Gatherings with Friends and Family:   . Attends Religious Services:   . Active Member of Clubs or Organizations:   . Attends Archivist Meetings:   Marland Kitchen Marital Status:   Intimate Partner Violence:   . Fear of Current or Ex-Partner:   . Emotionally Abused:   Marland Kitchen Physically Abused:   . Sexually Abused:     FAMILY HISTORY:  Family History  Problem Relation Age of Onset  . Diabetes Brother   . Diabetes Mother   . Leukemia Brother     . Diabetes Son     CURRENT MEDICATIONS:  Current Outpatient Medications  Medication Sig Dispense Refill  . amLODipine (NORVASC) 10 MG tablet TAKE 1 TABLET BY MOUTH EVERY DAY 90 tablet 0  . apixaban (ELIQUIS) 5 MG TABS tablet Take 1 tablet (5 mg total) by mouth 2 (two) times daily. 60 tablet 3  . CYCLOPHOSPHAMIDE IV Inject into the vein every 21 ( twenty-one) days.    Marland Kitchen docusate sodium (COLACE) 100 MG capsule Take 100 mg by mouth 2 (two) times daily.    Marland Kitchen DOXORUBICIN HCL IV Inject into the vein every 21 ( twenty-one) days.    . ETOPOSIDE IV Inject into the vein.    . furosemide (LASIX) 20 MG tablet TAKE 1 TABLET BY MOUTH EVERY DAY AS NEEDED 90 tablet 3  . gabapentin (NEURONTIN) 300 MG capsule Take 1 capsule (300 mg total) by mouth daily as needed. 15 capsule 0  . loratadine (CLARITIN) 10 MG tablet Take 10 mg by mouth daily.    Marland Kitchen LORazepam (ATIVAN) 0.5 MG tablet TAKE 1 TABLET (0.5 MG TOTAL) BY MOUTH EVERY 8 (EIGHT) HOURS. 60 tablet 2  . losartan (COZAAR) 100 MG tablet TAKE 1 TABLET BY MOUTH EVERY DAY 90 tablet 0  . metoprolol succinate (TOPROL-XL) 50 MG 24 hr tablet TAKE 1 TABLET BY MOUTH EVERY DAY 90 tablet 0  . phenazopyridine (PYRIDIUM) 100 MG tablet Take 1 tablet (100 mg total) by mouth 3 (three) times daily as needed for pain. 30 tablet 0  . predniSONE (DELTASONE) 20 MG tablet TAKE 3 TABLETS (60 MG TOTAL) BY MOUTH DAILY. TAKE ON DAYS 1-5 OF CHEMOTHERAPY. 150 tablet 0  . RITUXIMAB IV Inject into the vein every 21 ( twenty-one) days.    . tamsulosin (FLOMAX) 0.4 MG CAPS capsule Take 1 capsule (0.4 mg total) by mouth daily after supper. 90 capsule 3  . vinCRIStine 2 mg in sodium chloride 0.9 % 50 mL Inject 2 mg into the vein every 21 ( twenty-one) days.     Current Facility-Administered Medications  Medication Dose Route Frequency Provider Last Rate Last Admin  . ciprofloxacin (CIPRO) tablet 500 mg  500 mg Oral Once McKenzie, Candee Furbish, MD       Facility-Administered Medications Ordered  in Other Visits  Medication Dose Route Frequency Provider Last Rate Last Admin  . 0.9 %  sodium chloride infusion   Intravenous Continuous Derek Jack, MD   Stopped at 06/04/19 1304  . 0.9 %  sodium chloride infusion   Intravenous Continuous Derek Jack, MD   Stopped at 08/04/19 1225  . 0.9 %  sodium chloride infusion   Intravenous Continuous Derek Jack, MD 50 mL/hr at 08/05/19 1134 New Bag at 08/05/19 1134  . 0.9 %  sodium chloride infusion   Intravenous Continuous Derek Jack, MD 20 mL/hr at 08/26/19 1105 New Bag at 08/26/19 1105  . sodium chloride flush (NS) 0.9 % injection 10 mL  10 mL Intravenous PRN Derek Jack, MD   10 mL at 08/04/19 1155  . sodium chloride flush (NS) 0.9 % injection 10 mL  10 mL Intravenous PRN Derek Jack, MD   10 mL at 08/26/19 1104    ALLERGIES:  No Known Allergies  PHYSICAL EXAM:  Performance status (ECOG): 1 - Symptomatic but completely ambulatory  Vitals:   12/16/19 1121  BP: (!) 137/54  Pulse: (!) 54  Resp: 18  Temp: (!) 97.1 F (36.2 C)  SpO2: 97%   Wt Readings from Last 3 Encounters:  12/16/19 198 lb 1.6 oz (89.9 kg)  12/09/19 199 lb 9.6 oz (90.5 kg)  11/24/19 197 lb (89.4 kg)   Physical Exam Vitals reviewed.  Constitutional:      Appearance: Normal appearance. He is obese.  HENT:     Head:     Jaw: No tenderness or swelling.     Nose:     Right Sinus: No maxillary sinus tenderness or frontal sinus tenderness.     Left Sinus: No maxillary sinus tenderness or frontal sinus tenderness.  Cardiovascular:     Rate and Rhythm: Normal rate and regular rhythm.     Pulses: Normal pulses.     Heart sounds: Normal heart sounds.  Pulmonary:     Effort: Pulmonary effort is normal.     Breath sounds: Normal breath sounds.  Abdominal:     Palpations: Abdomen is soft. There is no mass.     Tenderness: There is no abdominal tenderness.     Hernia: No hernia is present.  Lymphadenopathy:      Cervical: No cervical adenopathy.     Right cervical: No deep cervical adenopathy.    Left cervical: No deep cervical adenopathy.     Upper Body:     Right upper body: No supraclavicular or axillary adenopathy.     Left upper body: No supraclavicular or axillary adenopathy.     Lower Body: No right inguinal adenopathy. No left inguinal adenopathy.  Neurological:     General: No focal deficit present.     Mental Status: He is alert and oriented to person, place, and time.  Psychiatric:        Mood and Affect: Mood normal.        Behavior: Behavior normal.     LABORATORY DATA:  I have reviewed the labs as listed.  CBC Latest Ref Rng & Units 11/24/2019 10/25/2019 09/20/2019  WBC 4.0 - 10.5 K/uL 5.3 5.3 9.8  Hemoglobin 13.0 - 17.0 g/dL 11.0(L) 9.2(L) 8.2(L)  Hematocrit 39 - 52 % 35.7(L) 30.1(L) 27.8(L)  Platelets 150 - 400 K/uL 200 184 294   CMP Latest Ref Rng & Units 11/24/2019 10/25/2019 09/20/2019  Glucose 70 - 99 mg/dL 98 91 117(H)  BUN 8 - 23 mg/dL 15 18 20   Creatinine 0.61 - 1.24 mg/dL 0.73 0.68 0.54(L)  Sodium 135 - 145 mmol/L 138 140 142  Potassium 3.5 - 5.1 mmol/L 4.3 3.8 3.8  Chloride 98 - 111 mmol/L 101 106 106  CO2 22 - 32 mmol/L 29 28 30   Calcium 8.9 - 10.3 mg/dL 9.3 8.7(L) 9.0  Total Protein 6.5 - 8.1 g/dL 6.2(L) 5.7(L) 5.6(L)  Total Bilirubin 0.3 - 1.2 mg/dL 0.5 0.4 0.3  Alkaline Phos 38 - 126 U/L 76 61 57  AST 15 - 41 U/L 13(L) 11(L) 13(L)  ALT 0 - 44 U/L 13 11 10       Component Value Date/Time   RBC 4.05 (L) 11/24/2019 1540  MCV 88.1 11/24/2019 1540   MCH 27.2 11/24/2019 1540   MCHC 30.8 11/24/2019 1540   RDW 13.8 11/24/2019 1540   LYMPHSABS 1.3 11/24/2019 1540   MONOABS 0.7 11/24/2019 1540   EOSABS 0.4 11/24/2019 1540   BASOSABS 0.0 11/24/2019 1540   Lab Results  Component Value Date   LDH 126 11/24/2019   LDH 124 10/25/2019   LDH 204 (H) 07/12/2019    DIAGNOSTIC IMAGING:  I have independently reviewed the scans and discussed with the patient. NM  PET Image Restag (PS) Skull Base To Thigh  Result Date: 12/15/2019 CLINICAL DATA:  Subsequent treatment strategy for lymphoma. EXAM: NUCLEAR MEDICINE PET SKULL BASE TO THIGH TECHNIQUE: 10.8 mCi F-18 FDG was injected intravenously. Full-ring PET imaging was performed from the skull base to thigh after the radiotracer. CT data was obtained and used for attenuation correction and anatomic localization. Fasting blood glucose: 90 mg/dl COMPARISON:  09/17/2019, CT abdomen pelvis 06/22/2019 and CT chest abdomen pelvis 04/18/2019. FINDINGS: Mediastinal blood pool activity: SUV max 2.5 Liver activity: SUV max 3.7 NECK: Enlarging soft tissue within the right maxillary sinus extends into the nasal cavity. Associated thickening of the walls of the right maxillary sinus with an SUV max of 7.5. Focal uptake in the right triceps musculature without a CT correlate. No hypermetabolic lymph nodes. Hypermetabolic periodontal disease is again noted. Incidental CT findings: None. CHEST: Axillary lymph nodes do not show hypermetabolism above blood pool, SUV max 2.4 on the right. No hypermetabolic mediastinal or hilar lymph nodes. No hypermetabolic pulmonary nodules. Incidental CT findings: Left-sided Port-A-Cath terminates in the SVC. Atherosclerotic calcification of the aorta, aortic valve and coronary arteries. Heart is at the upper limits of normal in size to mildly enlarged. Small amount of pericardial fluid may be physiologic. No pleural effusion. Probable peribronchovascular nodularity in the peripheral right lower lobe (8/38). ABDOMEN/PELVIS: No abnormal hypermetabolism in the liver, adrenal glands, spleen or pancreas. Gastrohepatic ligament lymph nodes are borderline hypermetabolic. Index 6 mm lymph node (4/107) has an SUV max of 2.7. Soft tissue and nodularity at the root and within the small bowel mesentery appear grossly similar, measuring 4.2 x 11.2 cm (4/127) with an SUV max 3.6, similar to 3.2 previously. External iliac  and inguinal lymph nodes do not show metabolism above blood pool, SUV max 2.3. Mild testicular hypermetabolism, as before. Incidental CT findings: Liver, gallbladder and right adrenal gland are unremarkable. Fluid density nodule in the medial limb left adrenal gland measures 2.0 cm. Right kidney is unremarkable. Left caliectasis or mild left hydronephrosis. Ureters are decompressed and previously seen double-J ureteral stents have been removed. Bladder is thick-walled and low in volume. Spleen is normal in size. Pancreas, stomach and bowel are unremarkable. Atherosclerotic calcification of the aorta without aneurysm. SKELETON: No residual abnormal osseous hypermetabolism. Incidental CT findings: Sclerotic lesion in the left iliac wing is unchanged and likely a bone island. Bilateral L5 pars defects with grade 1 anterolisthesis. Degenerative changes in the spine. IMPRESSION: 1. Small bowel mesenteric soft tissue mass/nodularity with mild hypermetabolism (Deauville 3). 2. Additional small lymph nodes in the chest, abdomen and pelvis with metabolism at or below mediastinum (Deauville 2). 3. Enlarging rounded soft tissue in the right maxillary sinus with periosteal thickening and mild hypermetabolism. Maxillofacial CT with contrast is recommended in further evaluation as malignancy cannot be excluded. 4. Left adrenal adenoma. 5. Aortic atherosclerosis (ICD10-I70.0). Coronary artery calcification. Electronically Signed   By: Lorin Picket M.D.   On: 12/15/2019 10:01  ASSESSMENT:  1.  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 -6 cycles of R-EPOCH from 05/11/2019 through 08/23/2019. -PET scan on 09/17/2019 showed central abdominal mass with SUV 2.9, measuring 10 x 3.7 cm.  Mildly enlarged right pelvic sidewall lymph node  12 mm SUV 1.7.  Areas of multifocal bone involvement with near complete resolution.  Deauville 2/3 in the five-point scale. -He was evaluated by lymphoma specialist Dr. Cassell Clement at the request [facility].  His case was discussed at tumor board.  No adjuvant radiation therapy or maintenance rituximab or transplant was recommended at this time. -PET scan on 12/14/2019 shows soft tissue and nodularity at the root and within the small bowel mesentery, grossly similar measuring 4.2 x 11.2 cm with SUV max 3.6, similar to 3.2 previously.  External iliac and inguinal lymph nodes do not show metabolic some low blood pool.  SUV 2.3. -Enlarging rounded soft tissue in the right maxillary sinus with periosteal thickening and mild hypermetabolic zone.  2.  Bilateral leg DVT: -Ultrasound Doppler on 04/18/2019 showed bilateral leg DVT. -He is on Eliquis since then.   PLAN:  1.  Double hit lymphoma: -I reviewed the PET scan findings with the patient.  He does not have any B symptoms.  No palpable adenopathy.  He is continuing to work full-time. -I reviewed his labs from 11/24/2019.  LDH was normal.  Hemoglobin improved to 11. -Based on the abnormal findings on the PET CT scan, I have recommended CT scan of the sinuses. -I have also made a referral to Dr. Benjamine Mola.  I will see him back in 5 weeks for follow-up.  2.  Bilateral leg DVT: -He wants to come off of anticoagulation. -I have recommended D-dimer and repeat Doppler.  If negative will consider discontinuing anticoagulation.  The initial DVT can be considered provoked as it happened at time of diagnosis when he had large pelvic mass and was hospitalized.  3.  Anxiety: -Continue Ativan 0.5 mg 1 to 2 tablets daily as needed.  4.  Lower extremity swelling: -Continue Lasix as needed.  No leg swelling is noted today.  5.  Hypertension: -Continue losartan, Toprol-XL and Norvasc.  Orders placed this encounter:  No orders of the defined types were placed in  this encounter.    Derek Jack, MD [place] [phone]   I, Milinda Antis, am acting as a scribe for Dr. Sanda Linger.  I, Derek Jack MD, have reviewed the above documentation for accuracy and completeness, and I agree with the above.

## 2019-12-27 ENCOUNTER — Other Ambulatory Visit (HOSPITAL_COMMUNITY): Payer: Self-pay | Admitting: Nurse Practitioner

## 2019-12-27 DIAGNOSIS — C8338 Diffuse large B-cell lymphoma, lymph nodes of multiple sites: Secondary | ICD-10-CM

## 2019-12-27 DIAGNOSIS — Z95828 Presence of other vascular implants and grafts: Secondary | ICD-10-CM

## 2019-12-29 ENCOUNTER — Telehealth: Payer: Self-pay

## 2019-12-29 ENCOUNTER — Other Ambulatory Visit: Payer: Self-pay

## 2019-12-29 ENCOUNTER — Ambulatory Visit (HOSPITAL_COMMUNITY)
Admission: RE | Admit: 2019-12-29 | Discharge: 2019-12-29 | Disposition: A | Discharge status: Home / Self Care | Payer: BLUE CROSS/BLUE SHIELD | Source: Ambulatory Visit | Attending: Hematology | Admitting: Hematology

## 2019-12-29 ENCOUNTER — Inpatient Hospital Stay (HOSPITAL_COMMUNITY): Payer: BLUE CROSS/BLUE SHIELD

## 2019-12-29 DIAGNOSIS — J3489 Other specified disorders of nose and nasal sinuses: Secondary | ICD-10-CM | POA: Insufficient documentation

## 2019-12-29 DIAGNOSIS — I82491 Acute embolism and thrombosis of other specified deep vein of right lower extremity: Secondary | ICD-10-CM | POA: Diagnosis not present

## 2019-12-29 DIAGNOSIS — Z7901 Long term (current) use of anticoagulants: Secondary | ICD-10-CM | POA: Diagnosis not present

## 2019-12-29 DIAGNOSIS — C8338 Diffuse large B-cell lymphoma, lymph nodes of multiple sites: Secondary | ICD-10-CM | POA: Diagnosis not present

## 2019-12-29 DIAGNOSIS — I82403 Acute embolism and thrombosis of unspecified deep veins of lower extremity, bilateral: Secondary | ICD-10-CM | POA: Insufficient documentation

## 2019-12-29 DIAGNOSIS — J321 Chronic frontal sinusitis: Secondary | ICD-10-CM | POA: Diagnosis not present

## 2019-12-29 DIAGNOSIS — J320 Chronic maxillary sinusitis: Secondary | ICD-10-CM | POA: Diagnosis not present

## 2019-12-29 DIAGNOSIS — Z87891 Personal history of nicotine dependence: Secondary | ICD-10-CM | POA: Diagnosis not present

## 2019-12-29 DIAGNOSIS — I10 Essential (primary) hypertension: Secondary | ICD-10-CM | POA: Diagnosis not present

## 2019-12-29 DIAGNOSIS — R233 Spontaneous ecchymoses: Secondary | ICD-10-CM | POA: Diagnosis not present

## 2019-12-29 DIAGNOSIS — J322 Chronic ethmoidal sinusitis: Secondary | ICD-10-CM | POA: Diagnosis not present

## 2019-12-29 DIAGNOSIS — F419 Anxiety disorder, unspecified: Secondary | ICD-10-CM | POA: Diagnosis not present

## 2019-12-29 LAB — COMPREHENSIVE METABOLIC PANEL
ALT: 13 U/L (ref 0–44)
AST: 19 U/L (ref 15–41)
Albumin: 4 g/dL (ref 3.5–5.0)
Alkaline Phosphatase: 90 U/L (ref 38–126)
Anion gap: 7 (ref 5–15)
BUN: 17 mg/dL (ref 8–23)
CO2: 29 mmol/L (ref 22–32)
Calcium: 9.4 mg/dL (ref 8.9–10.3)
Chloride: 104 mmol/L (ref 98–111)
Creatinine, Ser: 0.83 mg/dL (ref 0.61–1.24)
GFR calc Af Amer: >60 mL/min (ref >60)
GFR calc non Af Amer: >60 mL/min (ref >60)
Glucose, Bld: 117 mg/dL — ABNORMAL HIGH (ref 70–99)
Potassium: 4.4 mmol/L (ref 3.5–5.1)
Sodium: 140 mmol/L (ref 135–145)
Total Bilirubin: 0.6 mg/dL (ref 0.3–1.2)
Total Protein: 6.2 g/dL — ABNORMAL LOW (ref 6.5–8.1)

## 2019-12-29 LAB — CBC WITH DIFFERENTIAL/PLATELET
Abs Immature Granulocytes: 0.07 10*3/uL (ref 0.00–0.07)
Basophils Absolute: 0 10*3/uL (ref 0.0–0.1)
Basophils Relative: 1 %
Eosinophils Absolute: 0.3 10*3/uL (ref 0.0–0.5)
Eosinophils Relative: 6 %
HCT: 39.2 % (ref 39.0–52.0)
Hemoglobin: 12.5 g/dL — ABNORMAL LOW (ref 13.0–17.0)
Immature Granulocytes: 2 %
Lymphocytes Relative: 25 %
Lymphs Abs: 1.1 10*3/uL (ref 0.7–4.0)
MCH: 27.2 pg (ref 26.0–34.0)
MCHC: 31.9 g/dL (ref 30.0–36.0)
MCV: 85.4 fL (ref 80.0–100.0)
Monocytes Absolute: 0.6 10*3/uL (ref 0.1–1.0)
Monocytes Relative: 12 %
Neutro Abs: 2.4 10*3/uL (ref 1.7–7.7)
Neutrophils Relative %: 54 %
Platelets: 183 10*3/uL (ref 150–400)
RBC: 4.59 MIL/uL (ref 4.22–5.81)
RDW: 14.4 % (ref 11.5–15.5)
WBC: 4.5 10*3/uL (ref 4.0–10.5)
nRBC: 0 % (ref 0.0–0.2)

## 2019-12-29 LAB — D-DIMER, QUANTITATIVE: D-Dimer, Quant: 0.87 ug/mL-FEU — ABNORMAL HIGH (ref 0.00–0.50)

## 2019-12-29 MED ORDER — IOHEXOL 300 MG/ML  SOLN
75.0000 mL | Freq: Once | INTRAMUSCULAR | Status: AC | PRN
  Administered 2019-12-29: 75 mL via INTRAVENOUS

## 2019-12-29 NOTE — Telephone Encounter (Signed)
He does not need to come back before his next appointment. Can they not add it on to their labs so he doesn't have to be stuck just for this? Combine lab work so patient has less blood draws since he does them so often.

## 2019-12-29 NOTE — Progress Notes (Signed)
Spoke with nurse at [facility].  The patient has a follow up appointment with them on 01/20/2020 and she will add a lipid panel to his lab work on that day.

## 2019-12-29 NOTE — Telephone Encounter (Signed)
Received call back from [facility] about doing a lipid panel for this patient.  They do not do lab work through patient's port, he is drawn peripherally through his arm veins so we can draw labs here in our office.  Do you want patient to come in to have this done now or is it okay to wait until comes back for his follow up appointment with [place] on 01/25/2020?

## 2019-12-29 NOTE — Telephone Encounter (Signed)
The nurse at the cancer center said the patient does not need to have lab work at his next visit so he would be getting a lab draw just for [place].  He has an appointment with [place] about the same time.

## 2019-12-30 ENCOUNTER — Other Ambulatory Visit (HOSPITAL_COMMUNITY): Payer: Self-pay | Admitting: Nurse Practitioner

## 2019-12-30 NOTE — Telephone Encounter (Signed)
Okay. I will just order it when I see him again.

## 2020-01-04 DIAGNOSIS — J338 Other polyp of sinus: Secondary | ICD-10-CM | POA: Diagnosis not present

## 2020-01-04 DIAGNOSIS — I272 Pulmonary hypertension, unspecified: Secondary | ICD-10-CM | POA: Diagnosis not present

## 2020-01-04 DIAGNOSIS — J310 Chronic rhinitis: Secondary | ICD-10-CM | POA: Diagnosis not present

## 2020-01-04 DIAGNOSIS — J9601 Acute respiratory failure with hypoxia: Secondary | ICD-10-CM | POA: Diagnosis not present

## 2020-01-04 DIAGNOSIS — D385 Neoplasm of uncertain behavior of other respiratory organs: Secondary | ICD-10-CM | POA: Diagnosis not present

## 2020-01-06 ENCOUNTER — Other Ambulatory Visit: Payer: Self-pay | Admitting: Otolaryngology

## 2020-01-10 ENCOUNTER — Other Ambulatory Visit: Payer: Self-pay | Admitting: Family Medicine

## 2020-01-10 DIAGNOSIS — I10 Essential (primary) hypertension: Secondary | ICD-10-CM

## 2020-01-20 ENCOUNTER — Other Ambulatory Visit: Payer: Self-pay

## 2020-01-20 ENCOUNTER — Other Ambulatory Visit: Payer: Self-pay | Admitting: Family Medicine

## 2020-01-20 ENCOUNTER — Inpatient Hospital Stay (HOSPITAL_COMMUNITY): Payer: BLUE CROSS/BLUE SHIELD | Attending: Hematology | Admitting: Hematology

## 2020-01-20 VITALS — BP 149/75 | HR 51 | Temp 98.7°F | Resp 20 | Wt 197.3 lb

## 2020-01-20 DIAGNOSIS — C8338 Diffuse large B-cell lymphoma, lymph nodes of multiple sites: Secondary | ICD-10-CM | POA: Diagnosis not present

## 2020-01-20 DIAGNOSIS — Z7901 Long term (current) use of anticoagulants: Secondary | ICD-10-CM | POA: Diagnosis not present

## 2020-01-20 DIAGNOSIS — M7989 Other specified soft tissue disorders: Secondary | ICD-10-CM | POA: Diagnosis not present

## 2020-01-20 DIAGNOSIS — I82451 Acute embolism and thrombosis of right peroneal vein: Secondary | ICD-10-CM | POA: Diagnosis not present

## 2020-01-20 DIAGNOSIS — Z87891 Personal history of nicotine dependence: Secondary | ICD-10-CM | POA: Insufficient documentation

## 2020-01-20 DIAGNOSIS — I10 Essential (primary) hypertension: Secondary | ICD-10-CM | POA: Insufficient documentation

## 2020-01-20 DIAGNOSIS — F419 Anxiety disorder, unspecified: Secondary | ICD-10-CM | POA: Insufficient documentation

## 2020-01-20 DIAGNOSIS — Z86718 Personal history of other venous thrombosis and embolism: Secondary | ICD-10-CM | POA: Insufficient documentation

## 2020-01-20 NOTE — Patient Instructions (Signed)
[place] at [facility] Discharge Instructions  You were seen today by Dr. Delton Coombes. He went over your recent results and scans. You may proceed with your surgery. You may also stop taking the K Phos Mono-Sod and gabapentin. Dr. Delton Coombes will see you back in 4 weeks for labs and follow up.   Thank you for choosing [place] at [facility] to provide your oncology and hematology care.  To afford each patient quality time with our provider, please arrive at least 15 minutes before your scheduled appointment time.   If you have a lab appointment with the [facility] please come in thru the Main Entrance and check in at the main information desk  You need to re-schedule your appointment should you arrive 10 or more minutes late.  We strive to give you quality time with our providers, and arriving late affects you and other patients whose appointments are after yours.  Also, if you no show three or more times for appointments you may be dismissed from the clinic at the providers discretion.     Again, thank you for choosing [facility].  Our hope is that these requests will decrease the amount of time that you wait before being seen by our physicians.       _____________________________________________________________  Should you have questions after your visit to [facility], please contact our office at [phone] between the hours of 8:00 a.m. and 4:30 p.m.  Voicemails left after 4:00 p.m. will not be returned until the following business day.  For prescription refill requests, have your pharmacy contact our office and allow 72 hours.    Cancer Center Support Programs:   > Cancer Support Group  2nd Tuesday of the month 1pm-2pm, Journey Room

## 2020-01-20 NOTE — Progress Notes (Signed)
Kevin [name], [place] [id number]   CLINIC:  Medical Oncology/Hematology  PCP:  Kevin [name], [place] / [place] [address]  [phone]  REASON FOR VISIT:  Follow-up for high-grade DLBCL  PRIOR THERAPY: R-EPOCH x 6 cycles from 05/11/2019 to 08/23/2019  CURRENT THERAPY: Observation  INTERVAL HISTORY:  [name], a 64 y.o. male, returns for routine follow-up for his high-grade DLBCL. Kevin [name] was last seen on 12/16/2019.  Today he reports that he is working full time and he denies any F/C or weight loss. He is currently taking Eliquis and has some bruising on his lower arms. He reports having occasional diarrhea and he takes Colace daily. He denies having any numbness or tingling.  He is scheduled for an ethmoidectomy and maxillary antrostomy with tissue removal on 02/04/2020 with Dr. Benjamine Mola.   REVIEW OF SYSTEMS:  Review of Systems  Constitutional: Positive for fatigue (mild). Negative for appetite change.  Gastrointestinal: Positive for diarrhea.  Neurological: Negative for numbness.  Psychiatric/Behavioral: Positive for depression.  All other systems reviewed and are negative.   PAST MEDICAL/SURGICAL HISTORY:  Past Medical History:  Diagnosis Date  . 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 Hyperlipidemia 02/01/2014  . Hypertension   . Leg DVT (deep venous thromboembolism), acute, bilateral ([place]) 04/18/2019  . Port-A-Cath in place 04/27/2019   Past Surgical History:  Procedure Laterality Date  . CYSTOSCOPY W/ URETERAL STENT PLACEMENT Bilateral 04/21/2019   Procedure: CYSTOSCOPY WITH RETROGRADE PYELOGRAM/URETERAL STENT PLACEMENT;  Surgeon: Cleon Gustin, MD;  Location: [facility];  Service: Urology;  Laterality: Bilateral;  . INGUINAL LYMPH NODE BIOPSY Right 04/21/2019   Procedure: INGUINAL LYMPH NODE BIOPSY;  Surgeon: Virl Cagey, MD;  Location: [facility];  Service: General;  Laterality: Right;  .  PORTACATH PLACEMENT Left 05/07/2019   Procedure: INSERTION PORT-A-CATH (catheter left subclavian);  Surgeon: Virl Cagey, MD;  Location: [facility];  Service: General;  Laterality: Left;    SOCIAL HISTORY:  Social History   Socioeconomic History  . Marital status: Married    Spouse name: Not on file  . Number of children: Not on file  . Years of education: Not on file  . Highest education level: Not on file  Occupational History  . Not on file  Tobacco Use  . Smoking status: Former Smoker    Quit date: 04/27/1999    Years since quitting: 20.7  . Smokeless tobacco: Never Used  Vaping Use  . Vaping Use: Never used  Substance and Sexual Activity  . Alcohol use: Yes    Alcohol/week: 56.0 standard drinks    Types: 56 Cans of beer per week  . Drug use: Never  . Sexual activity: Not on file  Other Topics Concern  . Not on file  Social History Narrative  . Not on file   Social Determinants of Health   Financial Resource Strain:   . Difficulty of Paying Living Expenses:   Food Insecurity:   . Worried About Charity fundraiser in the Last Year:   . Arboriculturist in the Last Year:   Transportation Needs:   . Film/video editor (Medical):   Marland Kitchen Lack of Transportation (Non-Medical):   Physical Activity:   . Days of Exercise per Week:   . Minutes of Exercise per Session:   Stress:   . Feeling of Stress :   Social Connections:   . Frequency of Communication  with Friends and Family:   . Frequency of Social Gatherings with Friends and Family:   . Attends Religious Services:   . Active Member of Clubs or Organizations:   . Attends Archivist Meetings:   Marland Kitchen Marital Status:   Intimate Partner Violence:   . Fear of Current or Ex-Partner:   . Emotionally Abused:   Marland Kitchen Physically Abused:   . Sexually Abused:     FAMILY HISTORY:  Family History  Problem Relation Age of Onset  . Diabetes Brother   . Diabetes Mother   . Leukemia Brother   . Diabetes Son      CURRENT MEDICATIONS:  Current Outpatient Medications  Medication Sig Dispense Refill  . amLODipine (NORVASC) 10 MG tablet TAKE 1 TABLET BY MOUTH EVERY DAY 90 tablet 0  . docusate sodium (COLACE) 100 MG capsule Take 100 mg by mouth 2 (two) times daily.    Marland Kitchen ELIQUIS 5 MG TABS tablet TAKE 1 TABLET BY MOUTH TWICE A DAY 60 tablet 3  . furosemide (LASIX) 20 MG tablet TAKE 1 TABLET BY MOUTH EVERY DAY AS NEEDED 90 tablet 3  . gabapentin (NEURONTIN) 300 MG capsule Take 1 capsule (300 mg total) by mouth daily as needed. 15 capsule 0  . K Phos Mono-Sod Phos Di & Mono (PHOSPHA 250 NEUTRAL) 155-852-130 MG TABS TAKE 1 TABLET BY MOUTH THREE TIMES A DAY 90 tablet 0  . loratadine (CLARITIN) 10 MG tablet Take 10 mg by mouth daily.    Marland Kitchen LORazepam (ATIVAN) 0.5 MG tablet TAKE 1 TABLET (0.5 MG TOTAL) BY MOUTH EVERY 8 (EIGHT) HOURS. 60 tablet 2  . losartan (COZAAR) 100 MG tablet TAKE 1 TABLET BY MOUTH EVERY DAY 90 tablet 0  . metoprolol succinate (TOPROL-XL) 50 MG 24 hr tablet TAKE 1 TABLET BY MOUTH EVERY DAY 90 tablet 0  . tamsulosin (FLOMAX) 0.4 MG CAPS capsule Take 1 capsule (0.4 mg total) by mouth daily after supper. 90 capsule 3   Current Facility-Administered Medications  Medication Dose Route Frequency Provider Last Rate Last Admin  . ciprofloxacin (CIPRO) tablet 500 mg  500 mg Oral Once McKenzie, Candee Furbish, MD       Facility-Administered Medications Ordered in Other Visits  Medication Dose Route Frequency Provider Last Rate Last Admin  . 0.9 %  sodium chloride infusion   Intravenous Continuous Derek Jack, MD   Stopped at 06/04/19 1304  . 0.9 %  sodium chloride infusion   Intravenous Continuous Derek Jack, MD   Stopped at 08/04/19 1225  . 0.9 %  sodium chloride infusion   Intravenous Continuous Derek Jack, MD 50 mL/hr at 08/05/19 1134 New Bag at 08/05/19 1134  . 0.9 %  sodium chloride infusion   Intravenous Continuous Derek Jack, MD 20 mL/hr at 08/26/19 1105 New  Bag at 08/26/19 1105  . sodium chloride flush (NS) 0.9 % injection 10 mL  10 mL Intravenous PRN Derek Jack, MD   10 mL at 08/04/19 1155  . sodium chloride flush (NS) 0.9 % injection 10 mL  10 mL Intravenous PRN Derek Jack, MD   10 mL at 08/26/19 1104    ALLERGIES:  No Known Allergies  PHYSICAL EXAM:  Performance status (ECOG): 1 - Symptomatic but completely ambulatory  Vitals:   01/20/20 1113  BP: (!) 149/75  Pulse: 51  Resp: 20  Temp: 98.7 F (37.1 C)  SpO2: 94%   Wt Readings from Last 3 Encounters:  01/20/20 197 lb 4.8 oz (89.5 kg)  12/16/19  198 lb 1.6 oz (89.9 kg)  12/09/19 199 lb 9.6 oz (90.5 kg)   Physical Exam Vitals reviewed.  Constitutional:      Appearance: Normal appearance. He is obese.  HENT:     Nose:     Right Sinus: No maxillary sinus tenderness.     Left Sinus: No maxillary sinus tenderness.  Cardiovascular:     Rate and Rhythm: Normal rate and regular rhythm.     Pulses: Normal pulses.     Heart sounds: Normal heart sounds.  Pulmonary:     Effort: Pulmonary effort is normal.     Breath sounds: Normal breath sounds.  Neurological:     General: No focal deficit present.     Mental Status: He is alert and oriented to person, place, and time.  Psychiatric:        Mood and Affect: Mood normal.        Behavior: Behavior normal.     LABORATORY DATA:  I have reviewed the labs as listed.  CBC Latest Ref Rng & Units 12/29/2019 11/24/2019 10/25/2019  WBC 4.0 - 10.5 K/uL 4.5 5.3 5.3  Hemoglobin 13.0 - 17.0 g/dL 12.5(L) 11.0(L) 9.2(L)  Hematocrit 39 - 52 % 39.2 35.7(L) 30.1(L)  Platelets 150 - 400 K/uL 183 200 184   CMP Latest Ref Rng & Units 12/29/2019 11/24/2019 10/25/2019  Glucose 70 - 99 mg/dL 117(H) 98 91  BUN 8 - 23 mg/dL 17 15 18   Creatinine 0.61 - 1.24 mg/dL 0.83 0.73 0.68  Sodium 135 - 145 mmol/L 140 138 140  Potassium 3.5 - 5.1 mmol/L 4.4 4.3 3.8  Chloride 98 - 111 mmol/L 104 101 106  CO2 22 - 32 mmol/L 29 29 28   Calcium 8.9  - 10.3 mg/dL 9.4 9.3 8.7(L)  Total Protein 6.5 - 8.1 g/dL 6.2(L) 6.2(L) 5.7(L)  Total Bilirubin 0.3 - 1.2 mg/dL 0.6 0.5 0.4  Alkaline Phos 38 - 126 U/L 90 76 61  AST 15 - 41 U/L 19 13(L) 11(L)  ALT 0 - 44 U/L 13 13 11       Component Value Date/Time   RBC 4.59 12/29/2019 1050   MCV 85.4 12/29/2019 1050   MCH 27.2 12/29/2019 1050   MCHC 31.9 12/29/2019 1050   RDW 14.4 12/29/2019 1050   LYMPHSABS 1.1 12/29/2019 1050   MONOABS 0.6 12/29/2019 1050   EOSABS 0.3 12/29/2019 1050   BASOSABS 0.0 12/29/2019 1050    DIAGNOSTIC IMAGING:  I have independently reviewed the scans and discussed with the patient. US Venous Img Lower Bilateral  Result Date: 12/29/2019 CLINICAL DATA:  Previous DVT EXAM: BILATERAL LOWER EXTREMITY VENOUS DOPPLER ULTRASOUND TECHNIQUE: Gray-scale sonography with graded compression, as well as color Doppler and duplex ultrasound were performed to evaluate the lower extremity deep venous systems from the level of the common femoral vein and including the common femoral, femoral, profunda femoral, popliteal and calf veins including the posterior tibial, peroneal and gastrocnemius veins when visible. The superficial great saphenous vein was also interrogated. Spectral Doppler was utilized to evaluate flow at rest and with distal augmentation maneuvers in the common femoral, femoral and popliteal veins. COMPARISON:  04/20/2019 FINDINGS: RIGHT LOWER EXTREMITY Common Femoral Vein: No evidence of thrombus. Normal compressibility, respiratory phasicity and response to augmentation. Saphenofemoral Junction: No evidence of thrombus. Normal compressibility and flow on color Doppler imaging. Profunda Femoral Vein: No evidence of thrombus. Normal compressibility and flow on color Doppler imaging. Femoral Vein: No evidence of thrombus. Normal compressibility, respiratory phasicity and response  to augmentation. Popliteal Vein: No evidence of thrombus. Normal compressibility, respiratory phasicity  and response to augmentation. Calf Veins: Minimal nonocclusive hypoechoic thrombus in the right peroneal vein noted. Vessel is partially compressible. Posterior tibial vein appears patent. LEFT LOWER EXTREMITY Common Femoral Vein: No evidence of thrombus. Normal compressibility, respiratory phasicity and response to augmentation. Saphenofemoral Junction: No evidence of thrombus. Normal compressibility and flow on color Doppler imaging. Profunda Femoral Vein: No evidence of thrombus. Normal compressibility and flow on color Doppler imaging. Femoral Vein: No evidence of thrombus. Normal compressibility, respiratory phasicity and response to augmentation. Popliteal Vein: No evidence of thrombus. Normal compressibility, respiratory phasicity and response to augmentation. Calf Veins: No evidence of thrombus. Normal compressibility and flow on color Doppler imaging. IMPRESSION: Previous extensive right lower extremity DVT has resolved with minor residual nonocclusive right peroneal thrombus noted. Very minimal residual thrombus burden. Negative for left lower extremity DVT. Electronically Signed   By: Jerilynn Mages.  Shick M.D.   On: 12/29/2019 12:56   CT MAXILLOFACIAL W CONTRAST  Result Date: 12/29/2019 CLINICAL DATA:  Right maxillary sinus abnormality. History of diffuse large B-cell lymphoma. EXAM: CT MAXILLOFACIAL WITH CONTRAST TECHNIQUE: Multidetector CT images of the paranasal sinuses were obtained using the standard protocol without intravenous contrast. COMPARISON:  Prior PET scans 12/14/2019, 07/20/2019 FINDINGS: Paranasal sinuses: Frontal: Normally aerated. Patent frontal sinus drainage pathways. Ethmoid: Mild mucosal edema in the superior right ethmoid sinus. There is a masslike soft tissue density extending throughout the right maxillary sinus and into the right nasal cavity and ethmoid sinus with marked widening of the right maxillary ostium. Left ethmoid sinus clear. Maxillary: Soft tissue filling the right  maxillary sinus extending into the right ethmoid sinus as above. There is diffuse bony thickening of the right maxillary sinus. There is marked widening of the ostium. Mild mucosal edema in the floor of the left maxillary sinus. Sphenoid: Normally aerated. Patent sphenoethmoidal recesses. Right ostiomeatal unit: Occluded Left ostiomeatal unit: Patent. Nasal passages: Soft tissue density in the right superior nasal cavity and right ethmoid sinus arising from the right maxillary sinus. Inferior right nasal passageway is clear. Left nasal passageway is normal. Intact nasal septum is midline. Anatomy: Limited intracranial imaging negative Negative orbit bilaterally IMPRESSION: Soft tissue masslike structure filling the right maxillary sinus extending into the superior right nasal cavity and right ethmoid sinus. It is difficult to determine if this is enhancing due to lack of precontrast imaging. This is most likely a polyp which has grown since the PET CT of 07/20/2019. Underlying neoplasm also possible. Therefore biopsy recommended. 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 Electronically Signed   By: Franchot Gallo M.D.   On: 12/29/2019 20:30     ASSESSMENT:  1. 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 -6 cycles of R-EPOCH from 05/11/2019 through 08/23/2019. -PET scan on 09/17/2019 showed central abdominal mass with SUV 2.9, measuring 10 x 3.7 cm. Mildly enlarged right pelvic sidewall lymph node 12 mm SUV 1.7. Areas of multifocal bone involvement with near complete resolution. [place] 2/3 in the five-point scale. -He was evaluated by lymphoma specialist Dr. Cassell Clement at the request [facility].  His case was discussed at tumor board.  No adjuvant radiation therapy or maintenance  rituximab or transplant was recommended at this time. -PET scan on 12/14/2019 shows soft tissue  and nodularity at the root and within the small bowel mesentery, grossly similar measuring 4.2 x 11.2 cm with SUV max 3.6, similar to 3.2 previously.  External iliac and inguinal lymph nodes do not show metabolic some low blood pool.  SUV 2.3. -Enlarging rounded soft tissue in the right maxillary sinus with periosteal thickening and mild hypermetabolic zone.  2.  Bilateral leg DVT: -Ultrasound Doppler on 04/18/2019 showed bilateral leg DVT. -He is on Eliquis since then. -Repeat Doppler on 12/29/2019 shows previous extensive right lower extremity DVT has resolved with minor residual nonocclusive right peroneal thrombus noted.  Very minimal residual thrombus burden.  Negative left leg DVT.   PLAN:  1. Double hit lymphoma: -Labs from 12/29/2019 was normal. -I reviewed CT maxillofacial from 12/29/2019 showing soft tissue masslike structure filling the right maxillary sinus extending into the superior right nasal cavity and right ethmoid sinus. -He was evaluated by Dr. Benjamine Mola.  He is having surgery in the first week of August. -I will see him back 2 weeks after surgery.  2. Bilateral leg DVT: -I discussed results of repeat Doppler which showed persistent peroneal thrombus. -We will continue anticoagulation with Eliquis at this time.  We will plan to repeat Doppler in 3 months.  3. Anxiety: -Continue Ativan as needed.  4. Lower extremity swelling: -Continue Lasix as needed.  5. Hypertension: -Continue losartan, Toprol-XL and Norvasc.  Blood pressure well controlled.  Orders placed this encounter:  No orders of the defined types were placed in this encounter.    Derek Jack, MD [place] [phone]   I, Milinda Antis, am acting as a scribe for Dr. Sanda Linger.  I, Derek Jack MD, have reviewed the above documentation for accuracy and  completeness, and I agree with the above.

## 2020-01-22 ENCOUNTER — Other Ambulatory Visit (HOSPITAL_COMMUNITY): Payer: Self-pay | Admitting: Nurse Practitioner

## 2020-01-25 ENCOUNTER — Ambulatory Visit: Payer: BLUE CROSS/BLUE SHIELD | Admitting: Family Medicine

## 2020-01-25 ENCOUNTER — Other Ambulatory Visit: Payer: Self-pay

## 2020-01-25 ENCOUNTER — Encounter: Payer: Self-pay | Admitting: Family Medicine

## 2020-01-25 VITALS — BP 161/69 | HR 57 | Temp 98.4°F | Ht 66.0 in | Wt 195.6 lb

## 2020-01-25 DIAGNOSIS — G620 Drug-induced polyneuropathy: Secondary | ICD-10-CM | POA: Diagnosis not present

## 2020-01-25 DIAGNOSIS — F419 Anxiety disorder, unspecified: Secondary | ICD-10-CM | POA: Diagnosis not present

## 2020-01-25 MED ORDER — ESCITALOPRAM OXALATE 10 MG PO TABS: 10.0000 mg | ORAL_TABLET | Freq: Every day | ORAL | 2 refills | Status: DC

## 2020-01-25 NOTE — Progress Notes (Signed)
Assessment & Plan:  1. Drug-induced polyneuropathy ([place]) - Patient feels he is improving without medication.  2. Anxiety - Uncontrolled.  Started patient on Lexapro 10 mg once daily. - escitalopram (LEXAPRO) 10 MG tablet; Take 1 tablet (10 mg total) by mouth daily.  Dispense: 30 tablet; Refill: 2   Return in about 6 weeks (around 03/07/2020) for stress.  Hendricks Limes, MSN, APRN, FNP-C [facility]  Subjective:    Patient ID: Kevin [name], male    DOB: 10-02-1955, 64 y.o.   MRN: [id number]  Patient Care Team: Loman Brooklyn, FNP as PCP - General (Family Medicine)   Chief Complaint:  Chief Complaint  Patient presents with  . 6 week follow up    neuropathy. Patient states that he feels that his legs are getting better.  Patient states he has not been taking the gabapentin.    HPI: Kevin [name] is a 64 y.o. male presenting on 01/25/2020 for 6 week follow up (neuropathy. Patient states that he feels that his legs are getting better.  Patient states he has not been taking the gabapentin.)  Patient was prescribed gabapentin 300 mg daily as needed approximately 6 weeks ago for neuropathy.  He does not feel like he needs the gabapentin and has not been taking it.  He feels like his legs are slowly getting back to normal.  New complaints: Patient feels he gets stressed, anxious, and angry too easily.  He does have a prescription for Ativan 0.5 mg every 8 hours that is prescribed by his oncologist.   Social history:  Relevant past medical, surgical, family and social history reviewed and updated as indicated. Interim medical history since our last visit reviewed.  Allergies and medications reviewed and updated.  DATA REVIEWED: CHART IN EPIC  ROS: Negative unless specifically indicated above in HPI.    Current Outpatient Medications:  .  amLODipine (NORVASC) 10 MG tablet, TAKE 1 TABLET BY MOUTH EVERY DAY, Disp: 90 tablet, Rfl: 0 .  docusate sodium  (COLACE) 100 MG capsule, Take 100 mg by mouth 2 (two) times daily., Disp: , Rfl:  .  ELIQUIS 5 MG TABS tablet, TAKE 1 TABLET BY MOUTH TWICE A DAY, Disp: 60 tablet, Rfl: 3 .  furosemide (LASIX) 20 MG tablet, TAKE 1 TABLET BY MOUTH EVERY DAY AS NEEDED, Disp: 90 tablet, Rfl: 3 .  gabapentin (NEURONTIN) 300 MG capsule, Take 1 capsule (300 mg total) by mouth daily as needed., Disp: 15 capsule, Rfl: 0 .  loratadine (CLARITIN) 10 MG tablet, Take 10 mg by mouth daily., Disp: , Rfl:  .  LORazepam (ATIVAN) 0.5 MG tablet, TAKE 1 TABLET (0.5 MG TOTAL) BY MOUTH EVERY 8 (EIGHT) HOURS., Disp: 60 tablet, Rfl: 2 .  losartan (COZAAR) 100 MG tablet, TAKE 1 TABLET BY MOUTH EVERY DAY, Disp: 90 tablet, Rfl: 0 .  metoprolol succinate (TOPROL-XL) 50 MG 24 hr tablet, TAKE 1 TABLET BY MOUTH EVERY DAY, Disp: 90 tablet, Rfl: 0 .  tamsulosin (FLOMAX) 0.4 MG CAPS capsule, Take 1 capsule (0.4 mg total) by mouth daily after supper., Disp: 90 capsule, Rfl: 3 .  K Phos Mono-Sod Phos Di & Mono (PHOSPHA 250 NEUTRAL) 155-852-130 MG TABS, TAKE 1 TABLET BY MOUTH THREE TIMES A DAY, Disp: 90 tablet, Rfl: 0  Current Facility-Administered Medications:  .  ciprofloxacin (CIPRO) tablet 500 mg, 500 mg, Oral, Once, McKenzie, Candee Furbish, MD  Facility-Administered Medications Ordered in Other Visits:  .  0.9 %  sodium chloride infusion, ,  Intravenous, Continuous, Derek Jack, MD, Stopped at 06/04/19 1304 .  0.9 %  sodium chloride infusion, , Intravenous, Continuous, Derek Jack, MD, Stopped at 08/04/19 1225 .  0.9 %  sodium chloride infusion, , Intravenous, Continuous, Derek Jack, MD, Last Rate: 50 mL/hr at 08/05/19 1134, New Bag at 08/05/19 1134 .  0.9 %  sodium chloride infusion, , Intravenous, Continuous, Derek Jack, MD, Last Rate: 20 mL/hr at 08/26/19 1105, New Bag at 08/26/19 1105 .  sodium chloride flush (NS) 0.9 % injection 10 mL, 10 mL, Intravenous, PRN, Derek Jack, MD, 10 mL at 08/04/19  1155 .  sodium chloride flush (NS) 0.9 % injection 10 mL, 10 mL, Intravenous, PRN, Derek Jack, MD, 10 mL at 08/26/19 1104   No Known Allergies Past Medical History:  Diagnosis Date  . Diffuse large B cell lymphoma ([place]) 04/26/2019  . Hyperlipidemia 02/01/2014  . Hypertension   . Leg DVT (deep venous thromboembolism), acute, bilateral ([place]) 04/18/2019  . Port-A-Cath in place 04/27/2019    Past Surgical History:  Procedure Laterality Date  . CYSTOSCOPY W/ URETERAL STENT PLACEMENT Bilateral 04/21/2019   Procedure: CYSTOSCOPY WITH RETROGRADE PYELOGRAM/URETERAL STENT PLACEMENT;  Surgeon: Cleon Gustin, MD;  Location: [facility];  Service: Urology;  Laterality: Bilateral;  . INGUINAL LYMPH NODE BIOPSY Right 04/21/2019   Procedure: INGUINAL LYMPH NODE BIOPSY;  Surgeon: Virl Cagey, MD;  Location: [facility];  Service: General;  Laterality: Right;  . PORTACATH PLACEMENT Left 05/07/2019   Procedure: INSERTION PORT-A-CATH (catheter left subclavian);  Surgeon: Virl Cagey, MD;  Location: [facility];  Service: General;  Laterality: Left;    Social History   Socioeconomic History  . Marital status: Married    Spouse name: Not on file  . Number of children: Not on file  . Years of education: Not on file  . Highest education level: Not on file  Occupational History  . Not on file  Tobacco Use  . Smoking status: Former Smoker    Quit date: 04/27/1999    Years since quitting: 20.7  . Smokeless tobacco: Never Used  Vaping Use  . Vaping Use: Never used  Substance and Sexual Activity  . Alcohol use: Yes    Alcohol/week: 56.0 standard drinks    Types: 56 Cans of beer per week  . Drug use: Never  . Sexual activity: Not on file  Other Topics Concern  . Not on file  Social History Narrative  . Not on file   Social Determinants of Health   Financial Resource Strain:   . Difficulty of Paying Living Expenses:   Food Insecurity:   . Worried About Charity fundraiser in the  Last Year:   . Arboriculturist in the Last Year:   Transportation Needs:   . Film/video editor (Medical):   Marland Kitchen Lack of Transportation (Non-Medical):   Physical Activity:   . Days of Exercise per Week:   . Minutes of Exercise per Session:   Stress:   . Feeling of Stress :   Social Connections:   . Frequency of Communication with Friends and Family:   . Frequency of Social Gatherings with Friends and Family:   . Attends Religious Services:   . Active Member of Clubs or Organizations:   . Attends Archivist Meetings:   Marland Kitchen Marital Status:   Intimate Partner Violence:   . Fear of Current or Ex-Partner:   . Emotionally Abused:   Marland Kitchen Physically Abused:   . Sexually  Abused:         Objective:    BP (!) 161/69   Pulse 57   Temp 98.4 F (36.9 C) (Temporal)   Ht 5\' 6"  (1.676 m)   Wt 195 lb 9.6 oz (88.7 kg)   SpO2 93%   BMI 31.57 kg/m   Wt Readings from Last 3 Encounters:  01/25/20 195 lb 9.6 oz (88.7 kg)  01/20/20 197 lb 4.8 oz (89.5 kg)  12/16/19 198 lb 1.6 oz (89.9 kg)    Physical Exam Vitals reviewed.  Constitutional:      General: He is not in acute distress.    Appearance: Normal appearance. He is obese. He is not ill-appearing, toxic-appearing or diaphoretic.  HENT:     Head: Normocephalic and atraumatic.  Eyes:     General: No scleral icterus.       Right eye: No discharge.        Left eye: No discharge.     Conjunctiva/sclera: Conjunctivae normal.  Cardiovascular:     Rate and Rhythm: Normal rate and regular rhythm.     Heart sounds: Normal heart sounds. No murmur heard.  No friction rub. No gallop.   Pulmonary:     Effort: Pulmonary effort is normal. No respiratory distress.     Breath sounds: Normal breath sounds. No stridor. No wheezing, rhonchi or rales.  Musculoskeletal:        General: Normal range of motion.     Cervical back: Normal range of motion.  Skin:    General: Skin is warm and dry.  Neurological:     Mental Status: He is  alert and oriented to person, place, and time. Mental status is at baseline.  Psychiatric:        Mood and Affect: Mood normal.        Behavior: Behavior normal.        Thought Content: Thought content normal.        Judgment: Judgment normal.     No results found for: [facility] Lab Results  Component Value Date   WBC 4.5 12/29/2019   HGB 12.5 (L) 12/29/2019   HCT 39.2 12/29/2019   MCV 85.4 12/29/2019   PLT 183 12/29/2019   Lab Results  Component Value Date   NA 140 12/29/2019   K 4.4 12/29/2019   CO2 29 12/29/2019   GLUCOSE 117 (H) 12/29/2019   BUN 17 12/29/2019   CREATININE 0.83 12/29/2019   BILITOT 0.6 12/29/2019   ALKPHOS 90 12/29/2019   AST 19 12/29/2019   ALT 13 12/29/2019   PROT 6.2 (L) 12/29/2019   ALBUMIN 4.0 12/29/2019   CALCIUM 9.4 12/29/2019   ANIONGAP 7 12/29/2019   No results found for: CHOL No results found for: HDL No results found for: [facility] Lab Results  Component Value Date   TRIG 181 (H) 04/17/2019   No results found for: CHOLHDL No results found for: HGBA1C

## 2020-01-28 ENCOUNTER — Encounter: Payer: Self-pay | Admitting: Family Medicine

## 2020-01-28 ENCOUNTER — Other Ambulatory Visit: Payer: Self-pay | Admitting: Family Medicine

## 2020-01-28 DIAGNOSIS — G620 Drug-induced polyneuropathy: Secondary | ICD-10-CM

## 2020-01-30 DIAGNOSIS — Z20822 Contact with and (suspected) exposure to covid-19: Secondary | ICD-10-CM | POA: Diagnosis not present

## 2020-02-01 ENCOUNTER — Encounter (HOSPITAL_BASED_OUTPATIENT_CLINIC_OR_DEPARTMENT_OTHER): Payer: Self-pay | Admitting: Otolaryngology

## 2020-02-01 ENCOUNTER — Other Ambulatory Visit: Payer: Self-pay

## 2020-02-01 ENCOUNTER — Other Ambulatory Visit (HOSPITAL_COMMUNITY)
Admission: RE | Admit: 2020-02-01 | Discharge: 2020-02-01 | Disposition: A | Discharge status: Home / Self Care | Payer: BLUE CROSS/BLUE SHIELD | Source: Ambulatory Visit | Attending: Otolaryngology | Admitting: Otolaryngology

## 2020-02-01 ENCOUNTER — Encounter (HOSPITAL_BASED_OUTPATIENT_CLINIC_OR_DEPARTMENT_OTHER)
Admission: RE | Admit: 2020-02-01 | Discharge: 2020-02-01 | Disposition: A | Discharge status: Home / Self Care | Payer: BLUE CROSS/BLUE SHIELD | Source: Ambulatory Visit | Attending: Otolaryngology | Admitting: Otolaryngology

## 2020-02-01 DIAGNOSIS — Z01812 Encounter for preprocedural laboratory examination: Secondary | ICD-10-CM | POA: Insufficient documentation

## 2020-02-01 DIAGNOSIS — Z8572 Personal history of non-Hodgkin lymphomas: Secondary | ICD-10-CM | POA: Diagnosis not present

## 2020-02-01 DIAGNOSIS — Z87891 Personal history of nicotine dependence: Secondary | ICD-10-CM | POA: Diagnosis not present

## 2020-02-01 DIAGNOSIS — J338 Other polyp of sinus: Secondary | ICD-10-CM | POA: Diagnosis not present

## 2020-02-01 DIAGNOSIS — Z20822 Contact with and (suspected) exposure to covid-19: Secondary | ICD-10-CM | POA: Diagnosis not present

## 2020-02-01 DIAGNOSIS — J320 Chronic maxillary sinusitis: Secondary | ICD-10-CM | POA: Diagnosis present

## 2020-02-01 DIAGNOSIS — B49 Unspecified mycosis: Secondary | ICD-10-CM | POA: Diagnosis not present

## 2020-02-01 DIAGNOSIS — J322 Chronic ethmoidal sinusitis: Secondary | ICD-10-CM | POA: Diagnosis not present

## 2020-02-01 DIAGNOSIS — I272 Pulmonary hypertension, unspecified: Secondary | ICD-10-CM | POA: Diagnosis not present

## 2020-02-01 DIAGNOSIS — I10 Essential (primary) hypertension: Secondary | ICD-10-CM | POA: Diagnosis not present

## 2020-02-01 DIAGNOSIS — Z9221 Personal history of antineoplastic chemotherapy: Secondary | ICD-10-CM | POA: Diagnosis not present

## 2020-02-01 LAB — BASIC METABOLIC PANEL
Anion gap: 8 (ref 5–15)
BUN: 17 mg/dL (ref 8–23)
CO2: 28 mmol/L (ref 22–32)
Calcium: 9.2 mg/dL (ref 8.9–10.3)
Chloride: 100 mmol/L (ref 98–111)
Creatinine, Ser: 0.77 mg/dL (ref 0.61–1.24)
GFR calc Af Amer: >60 mL/min (ref >60)
GFR calc non Af Amer: >60 mL/min (ref >60)
Glucose, Bld: 117 mg/dL — ABNORMAL HIGH (ref 70–99)
Potassium: 5.1 mmol/L (ref 3.5–5.1)
Sodium: 136 mmol/L (ref 135–145)

## 2020-02-01 LAB — SARS CORONAVIRUS 2 (TAT 6-24 HRS): SARS Coronavirus 2: NEGATIVE

## 2020-02-04 ENCOUNTER — Ambulatory Visit (HOSPITAL_BASED_OUTPATIENT_CLINIC_OR_DEPARTMENT_OTHER): Payer: BLUE CROSS/BLUE SHIELD | Admitting: Certified Registered"

## 2020-02-04 ENCOUNTER — Ambulatory Visit (HOSPITAL_BASED_OUTPATIENT_CLINIC_OR_DEPARTMENT_OTHER)
Admission: RE | Admit: 2020-02-04 | Discharge: 2020-02-04 | Disposition: A | Discharge status: Home / Self Care | Payer: BLUE CROSS/BLUE SHIELD | Attending: Otolaryngology | Admitting: Otolaryngology

## 2020-02-04 ENCOUNTER — Encounter (HOSPITAL_BASED_OUTPATIENT_CLINIC_OR_DEPARTMENT_OTHER): Payer: Self-pay | Admitting: Otolaryngology

## 2020-02-04 ENCOUNTER — Encounter (HOSPITAL_BASED_OUTPATIENT_CLINIC_OR_DEPARTMENT_OTHER)
Admission: RE | Disposition: A | Discharge status: Home / Self Care | Payer: Self-pay | Source: Home / Self Care | Attending: Otolaryngology

## 2020-02-04 ENCOUNTER — Other Ambulatory Visit: Payer: Self-pay

## 2020-02-04 DIAGNOSIS — J329 Chronic sinusitis, unspecified: Secondary | ICD-10-CM | POA: Diagnosis not present

## 2020-02-04 DIAGNOSIS — J328 Other chronic sinusitis: Secondary | ICD-10-CM | POA: Diagnosis not present

## 2020-02-04 DIAGNOSIS — J338 Other polyp of sinus: Secondary | ICD-10-CM | POA: Diagnosis not present

## 2020-02-04 DIAGNOSIS — I10 Essential (primary) hypertension: Secondary | ICD-10-CM | POA: Insufficient documentation

## 2020-02-04 DIAGNOSIS — E785 Hyperlipidemia, unspecified: Secondary | ICD-10-CM | POA: Diagnosis not present

## 2020-02-04 DIAGNOSIS — I272 Pulmonary hypertension, unspecified: Secondary | ICD-10-CM | POA: Diagnosis not present

## 2020-02-04 DIAGNOSIS — J322 Chronic ethmoidal sinusitis: Secondary | ICD-10-CM | POA: Diagnosis not present

## 2020-02-04 DIAGNOSIS — B49 Unspecified mycosis: Secondary | ICD-10-CM | POA: Diagnosis not present

## 2020-02-04 DIAGNOSIS — J320 Chronic maxillary sinusitis: Secondary | ICD-10-CM | POA: Insufficient documentation

## 2020-02-04 DIAGNOSIS — Z9221 Personal history of antineoplastic chemotherapy: Secondary | ICD-10-CM | POA: Diagnosis not present

## 2020-02-04 DIAGNOSIS — Z8572 Personal history of non-Hodgkin lymphomas: Secondary | ICD-10-CM | POA: Diagnosis not present

## 2020-02-04 DIAGNOSIS — Z87891 Personal history of nicotine dependence: Secondary | ICD-10-CM | POA: Diagnosis not present

## 2020-02-04 HISTORY — PX: MAXILLARY ANTROSTOMY: SHX2003

## 2020-02-04 HISTORY — DX: Depression, unspecified: F32.A

## 2020-02-04 HISTORY — DX: Anxiety disorder, unspecified: F41.9

## 2020-02-04 HISTORY — PX: ETHMOIDECTOMY: SHX5197

## 2020-02-04 SURGERY — ETHMOIDECTOMY
Anesthesia: General | Site: Nose | Laterality: Right

## 2020-02-04 MED ORDER — MUPIROCIN 2 % EX OINT
TOPICAL_OINTMENT | CUTANEOUS | Status: AC
  Filled 2020-02-04: qty 44

## 2020-02-04 MED ORDER — CEFAZOLIN SODIUM-DEXTROSE 2-3 GM-%(50ML) IV SOLR
INTRAVENOUS | Status: DC | PRN
  Administered 2020-02-04: 2 g via INTRAVENOUS

## 2020-02-04 MED ORDER — ROCURONIUM BROMIDE 100 MG/10ML IV SOLN
INTRAVENOUS | Status: DC | PRN
  Administered 2020-02-04: 80 mg via INTRAVENOUS

## 2020-02-04 MED ORDER — DEXAMETHASONE SODIUM PHOSPHATE 4 MG/ML IJ SOLN
INTRAMUSCULAR | Status: DC | PRN
  Administered 2020-02-04: 10 mg via INTRAVENOUS

## 2020-02-04 MED ORDER — OXYCODONE HCL 5 MG PO TABS: 5.0000 mg | ORAL_TABLET | Freq: Once | ORAL | Status: DC | PRN

## 2020-02-04 MED ORDER — HYDRALAZINE HCL 20 MG/ML IJ SOLN
INTRAMUSCULAR | Status: DC | PRN
  Administered 2020-02-04 (×2): 2.5 mg via INTRAVENOUS

## 2020-02-04 MED ORDER — MEPERIDINE HCL 25 MG/ML IJ SOLN: 6.2500 mg | INTRAMUSCULAR | Status: DC | PRN

## 2020-02-04 MED ORDER — OXYMETAZOLINE HCL 0.05 % NA SOLN
NASAL | Status: DC | PRN
  Administered 2020-02-04: 1 via TOPICAL

## 2020-02-04 MED ORDER — LIDOCAINE HCL (CARDIAC) PF 100 MG/5ML IV SOSY
PREFILLED_SYRINGE | INTRAVENOUS | Status: DC | PRN
  Administered 2020-02-04: 100 mg via INTRAVENOUS

## 2020-02-04 MED ORDER — BACITRACIN ZINC 500 UNIT/GM EX OINT
TOPICAL_OINTMENT | CUTANEOUS | Status: AC
  Filled 2020-02-04: qty 56.7

## 2020-02-04 MED ORDER — LIDOCAINE-EPINEPHRINE 1 %-1:100000 IJ SOLN
INTRAMUSCULAR | Status: AC
  Filled 2020-02-04: qty 3

## 2020-02-04 MED ORDER — SUGAMMADEX SODIUM 200 MG/2ML IV SOLN
INTRAVENOUS | Status: DC | PRN
  Administered 2020-02-04: 200 mg via INTRAVENOUS

## 2020-02-04 MED ORDER — LIDOCAINE 2% (20 MG/ML) 5 ML SYRINGE
INTRAMUSCULAR | Status: AC
  Filled 2020-02-04: qty 5

## 2020-02-04 MED ORDER — ONDANSETRON HCL 4 MG/2ML IJ SOLN
INTRAMUSCULAR | Status: AC
  Filled 2020-02-04: qty 2

## 2020-02-04 MED ORDER — MIDAZOLAM HCL 2 MG/2ML IJ SOLN
INTRAMUSCULAR | Status: AC
  Filled 2020-02-04: qty 2

## 2020-02-04 MED ORDER — PROMETHAZINE HCL 25 MG/ML IJ SOLN: 6.2500 mg | INTRAMUSCULAR | Status: DC | PRN

## 2020-02-04 MED ORDER — MUPIROCIN 2 % EX OINT
TOPICAL_OINTMENT | CUTANEOUS | Status: DC | PRN
  Administered 2020-02-04: 1 via NASAL

## 2020-02-04 MED ORDER — AMOXICILLIN 875 MG PO TABS: 875.0000 mg | ORAL_TABLET | Freq: Two times a day (BID) | ORAL | 0 refills | Status: AC

## 2020-02-04 MED ORDER — HYDROMORPHONE HCL 1 MG/ML IJ SOLN: 0.2500 mg | INTRAMUSCULAR | Status: DC | PRN

## 2020-02-04 MED ORDER — MIDAZOLAM HCL 5 MG/5ML IJ SOLN
INTRAMUSCULAR | Status: DC | PRN
  Administered 2020-02-04: 2 mg via INTRAVENOUS

## 2020-02-04 MED ORDER — DEXAMETHASONE SODIUM PHOSPHATE 10 MG/ML IJ SOLN
INTRAMUSCULAR | Status: AC
  Filled 2020-02-04: qty 1

## 2020-02-04 MED ORDER — PROPOFOL 10 MG/ML IV BOLUS
INTRAVENOUS | Status: AC
  Filled 2020-02-04: qty 20

## 2020-02-04 MED ORDER — OXYCODONE HCL 5 MG/5ML PO SOLN: 5.0000 mg | Freq: Once | ORAL | Status: DC | PRN

## 2020-02-04 MED ORDER — FENTANYL CITRATE (PF) 100 MCG/2ML IJ SOLN
INTRAMUSCULAR | Status: AC
  Filled 2020-02-04: qty 2

## 2020-02-04 MED ORDER — LACTATED RINGERS IV SOLN: INTRAVENOUS | Status: DC

## 2020-02-04 MED ORDER — ONDANSETRON HCL 4 MG/2ML IJ SOLN
INTRAMUSCULAR | Status: DC | PRN
  Administered 2020-02-04: 4 mg via INTRAVENOUS

## 2020-02-04 MED ORDER — FENTANYL CITRATE (PF) 100 MCG/2ML IJ SOLN
INTRAMUSCULAR | Status: DC | PRN
  Administered 2020-02-04: 50 ug via INTRAVENOUS
  Administered 2020-02-04: 100 ug via INTRAVENOUS

## 2020-02-04 MED ORDER — AMISULPRIDE (ANTIEMETIC) 5 MG/2ML IV SOLN: 10.0000 mg | Freq: Once | INTRAVENOUS | Status: DC | PRN

## 2020-02-04 MED ORDER — OXYCODONE-ACETAMINOPHEN 10-325 MG PO TABS: 1.0000 | ORAL_TABLET | ORAL | 0 refills | Status: AC | PRN

## 2020-02-04 MED ORDER — PROPOFOL 10 MG/ML IV BOLUS
INTRAVENOUS | Status: DC | PRN
  Administered 2020-02-04: 150 mg via INTRAVENOUS
  Administered 2020-02-04: 50 mg via INTRAVENOUS

## 2020-02-04 SURGICAL SUPPLY — 44 items
BLADE RAD40 ROTATE 4M 4 5PK (BLADE) IMPLANT
BLADE RAD60 ROTATE M4 4 5PK (BLADE) IMPLANT
BLADE TRICUT ROTATE M4 4 5PK (BLADE) ×1 IMPLANT
BUR HS RAD FRONTAL 3 (BURR) IMPLANT
CANISTER SUC SOCK COL 7IN (MISCELLANEOUS) ×2 IMPLANT
CANISTER SUCT 1200ML W/VALVE (MISCELLANEOUS) ×2 IMPLANT
COAGULATOR SUCT 8FR VV (MISCELLANEOUS) ×1 IMPLANT
COVER WAND RF STERILE (DRAPES) IMPLANT
DECANTER SPIKE VIAL GLASS SM (MISCELLANEOUS) ×1 IMPLANT
DRSG NASOPORE 8CM (GAUZE/BANDAGES/DRESSINGS) ×1 IMPLANT
DRSG TELFA 3X8 NADH (GAUZE/BANDAGES/DRESSINGS) IMPLANT
ELECT REM PT RETURN 9FT ADLT (ELECTROSURGICAL) ×2
ELECTRODE REM PT RTRN 9FT ADLT (ELECTROSURGICAL) IMPLANT
GLOVE BIO SURGEON STRL SZ7.5 (GLOVE) ×2 IMPLANT
GLOVE BIOGEL PI IND STRL 7.0 (GLOVE) IMPLANT
GLOVE BIOGEL PI INDICATOR 7.0 (GLOVE) ×1
GLOVE ECLIPSE 6.5 STRL STRAW (GLOVE) ×1 IMPLANT
GOWN STRL REUS W/ TWL LRG LVL3 (GOWN DISPOSABLE) ×2 IMPLANT
GOWN STRL REUS W/TWL LRG LVL3 (GOWN DISPOSABLE) ×4
HEMOSTAT SURGICEL 2X14 (HEMOSTASIS) IMPLANT
IV NS 500ML (IV SOLUTION) ×2
IV NS 500ML BAXH (IV SOLUTION) ×1 IMPLANT
MANIFOLD NEPTUNE II (INSTRUMENTS) ×1 IMPLANT
NDL HYPO 25X1 1.5 SAFETY (NEEDLE) ×1 IMPLANT
NEEDLE HYPO 25X1 1.5 SAFETY (NEEDLE) ×2 IMPLANT
NS IRRIG 1000ML POUR BTL (IV SOLUTION) ×1 IMPLANT
PACK BASIN DAY SURGERY FS (CUSTOM PROCEDURE TRAY) ×2 IMPLANT
PACK ENT DAY SURGERY (CUSTOM PROCEDURE TRAY) ×2 IMPLANT
PAD DRESSING TELFA 3X8 NADH (GAUZE/BANDAGES/DRESSINGS) IMPLANT
SLEEVE SCD COMPRESS KNEE MED (MISCELLANEOUS) ×2 IMPLANT
SOLUTION BUTLER CLEAR DIP (MISCELLANEOUS) ×2 IMPLANT
SPLINT NASAL AIRWAY SILICONE (MISCELLANEOUS) IMPLANT
SPONGE GAUZE 2X2 8PLY STRL LF (GAUZE/BANDAGES/DRESSINGS) ×2 IMPLANT
SPONGE NEURO XRAY DETECT 1X3 (DISPOSABLE) ×2 IMPLANT
SUCTION FRAZIER HANDLE 10FR (MISCELLANEOUS)
SUCTION TUBE FRAZIER 10FR DISP (MISCELLANEOUS) IMPLANT
SUT CHROMIC 4 0 P 3 18 (SUTURE) IMPLANT
SUT PLAIN 4 0 ~~LOC~~ 1 (SUTURE) IMPLANT
SUT PROLENE 3 0 PS 2 (SUTURE) IMPLANT
SYR 50ML LL SCALE MARK (SYRINGE) IMPLANT
TOWEL GREEN STERILE FF (TOWEL DISPOSABLE) ×2 IMPLANT
TUBE CONNECTING 20X1/4 (TUBING) ×2 IMPLANT
TUBE SALEM SUMP 16 FR W/ARV (TUBING) IMPLANT
YANKAUER SUCT BULB TIP NO VENT (SUCTIONS) ×1 IMPLANT

## 2020-02-04 NOTE — Op Note (Signed)
DATE OF PROCEDURE: 02/04/2020  OPERATIVE REPORT   SURGEON: Leta Baptist, MD   PREOPERATIVE DIAGNOSES:  1. Right maxillary and ethmoid mass  POSTOPERATIVE DIAGNOSES:  1. Right maxillary and ethmoid sinusitis and fungus ball  PROCEDURE PERFORMED:  1. Right endoscopic total ethmoidectomy 2. Right endoscopic maxillary antrostomy and tissue removal  ANESTHESIA: General endotracheal tube anesthesia.   COMPLICATIONS: None.   ESTIMATED BLOOD LOSS: 300 mL  INDICATION FOR PROCEDURE: Kevin [name] is a 64 y.o. male who was recently noted to have a soft tissue mass within the right maxillary and ethmoid sinuses.  The patient has a history of diffuse large B-cell lymphoma.  He was treated with chemotherapy in March 2021.  On his PET CT scan, he was noted to have a right-sided sinus abnormalities.  He subsequently underwent a sinus CT scan.  The CT showed a soft tissue mass filling the right maxillary and ethmoid sinuses.  He has no previous history of sinus surgery.  Based on the above findings, the decision was made for the patient to undergo the above-stated procedures. The risks, benefits, alternatives, and details of the procedures were discussed with the patient. Questions were invited and answered. Informed consent was obtained.   DESCRIPTION OF PROCEDURE: The patient was taken to the operating room and placed supine on the operating table. General endotracheal tube anesthesia was administered by the anesthesiologist. The patient was positioned, and prepped and draped in the standard fashion for nasal surgery. Pledgets soaked with Afrin were placed in both nasal cavities for decongestion. The pledgets were subsequently removed.   Using a 0 endoscope, the right nasal cavity was examined.  A large middle turbinate was noted.  The inferior one half of the middle turbinate was resected.  Polypoid tissue was noted within the middle meatus. The polypoid tissue was removed using a combination of  microdebrider and Blakesley forceps. The uncinate process was resected with a freer elevator. The maxillary antrum was entered and enlarged using a combination of backbiter and microdebrider.  A large amount of fungus ball was noted within the maxillary sinus.  The fungus ball and polypoid tissue was removed from the right maxillary sinus.  Attention was then focused on the ethmoid sinuses. The bony partitions of the anterior and posterior ethmoid cavities were taken down. Polypoid tissue was noted and removed.  The sinuses were copiously irrigated with saline solution.  The care of the patient was turned over to the anesthesiologist. The patient was awakened from anesthesia without difficulty. The patient was extubated and transferred to the recovery room in good condition.   OPERATIVE FINDINGS: Right chronic maxillary and ethmoid sinusitis with fungus ball and polypoid tissue.  SPECIMEN: Right sinus contents.  FOLLOWUP CARE: The patient be discharged home once he is awake and alert.  The patient will follow up in my office in 1 week for postop debridement.  Mikenna Bunkley Raynelle Bring, MD

## 2020-02-04 NOTE — Transfer of Care (Signed)
Immediate Anesthesia Transfer of Care Note  Patient: Kevin [name]  Procedure(s) Performed: ETHMOIDECTOMY (Right Nose) MAXILLARY ANTROSTOMY WITH TISSUE REMOVAL (Right Nose)  Patient Location: PACU  Anesthesia Type:General  Level of Consciousness: awake, alert , oriented and patient cooperative  Airway & Oxygen Therapy: Patient Spontanous Breathing and Patient connected to face mask oxygen  Post-op Assessment: Report given to RN and Post -op Vital signs reviewed and stable  Post vital signs: Reviewed and stable  Last Vitals:  Vitals Value Taken Time  BP    Temp    Pulse 64 02/04/20 0855  Resp    SpO2 100 % 02/04/20 0855  Vitals shown include unvalidated device data.  Last Pain:  Vitals:   02/04/20 0655  TempSrc: Oral  PainSc: 0-No pain         Complications: No complications documented.

## 2020-02-04 NOTE — Anesthesia Procedure Notes (Signed)
Procedure Name: Intubation °Performed by: Nidhi Jacome M, CRNA °Pre-anesthesia Checklist: Patient identified, Emergency Drugs available, Suction available and Patient being monitored °Patient Re-evaluated:Patient Re-evaluated prior to induction °Oxygen Delivery Method: Circle system utilized °Preoxygenation: Pre-oxygenation with 100% oxygen °Induction Type: IV induction °Ventilation: Mask ventilation without difficulty °Laryngoscope Size: Mac and 4 °Grade View: Grade I °Tube type: Oral °Tube size: 7.0 mm °Number of attempts: 1 °Airway Equipment and Method: Stylet and Oral airway °Placement Confirmation: ETT inserted through vocal cords under direct vision,  positive ETCO2,  breath sounds checked- equal and bilateral and CO2 detector °Secured at: 23 cm °Tube secured with: Tape °Dental Injury: Teeth and Oropharynx as per pre-operative assessment  ° ° ° ° ° ° °

## 2020-02-04 NOTE — Discharge Instructions (Addendum)
POSTOPERATIVE INSTRUCTIONS FOR PATIENTS HAVING NASAL OR SINUS OPERATIONS ACTIVITY: Restrict activity at home for the first two days, resting as much as possible. Light activity is best. You may usually return to work within a week. You should refrain from nose blowing, strenuous activity, or heavy lifting greater than 20lbs for a total of one week after your operation.  If sneezing cannot be avoided, sneeze with your mouth open. DISCOMFORT: You may experience a dull headache and pressure along with nasal congestion and discharge. These symptoms may be worse during the first week after the operation but may last as long as two to four weeks.  Please take Tylenol or the pain medication that has been prescribed for you. Do not take aspirin or aspirin containing medications since they may cause bleeding.  You may experience symptoms of post nasal drainage, nasal congestion, headaches and fatigue for two or three months after your operation.  BLEEDING: You may have some blood tinged nasal drainage for approximately two weeks after the operation.  The discharge will be worse for the first week.  Please call our office at [phone] or go to the nearest hospital emergency room if you experience any of the following: heavy, bright red blood from your nose or mouth that lasts longer than 15 minutes or coughing up or vomiting bright red blood or blood clots. GENERAL CONSIDERATIONS: 1. A gauze dressing will be placed on your upper lip to absorb any drainage after the operation. You may need to change this several times a day.  If you do not have very much drainage, you may remove the dressing.  Remember that you may gently wipe your nose with a tissue and sniff in, but DO NOT blow your nose. 2. Please keep all of your postoperative appointments.  Your final results after the operation will depend on proper follow-up.  The initial visit is usually 2 to 5 days after the operation.  During this visit, the remaining nasal  packing and internal septal splints will be removed.  Your nasal and sinus cavities will be cleaned.  During the second visit, your nasal and sinus cavities will be cleaned again. Have someone drive you to your first two postoperative appointments.  3. How you care for your nose after the operation will influence the results that you obtain.  You should follow all directions, take your medication as prescribed, and call our office [phone] with any problems or questions. 4. You may be more comfortable sleeping with your head elevated on two pillows. 5. Do not take any medications that we have not prescribed or recommended. WARNING SIGNS: if any of the following should occur, please call our office: 1. Persistent fever greater than 102F. 2. Persistent vomiting. 3. Severe and constant pain that is not relieved by prescribed pain medication. 4. Trauma to the nose. 5. Rash or unusual side effects from any medicines.  Post Anesthesia Home Care Instructions  Activity: Get plenty of rest for the remainder of the day. A responsible individual must stay with you for 24 hours following the procedure.  For the next 24 hours, DO NOT: -Drive a car -Operate machinery -Drink alcoholic beverages -Take any medication unless instructed by your physician -Make any legal decisions or sign important papers.  Meals: Start with liquid foods such as gelatin or soup. Progress to regular foods as tolerated. Avoid greasy, spicy, heavy foods. If nausea and/or vomiting occur, drink only clear liquids until the nausea and/or vomiting subsides. Call your physician if vomiting continues.    Special Instructions/Symptoms: Your throat may feel dry or sore from the anesthesia or the breathing tube placed in your throat during surgery. If this causes discomfort, gargle with warm salt water. The discomfort should disappear within 24 hours.  If you had a scopolamine patch placed behind your ear for the management of post-  operative nausea and/or vomiting:  1. The medication in the patch is effective for 72 hours, after which it should be removed.  Wrap patch in a tissue and discard in the trash. Wash hands thoroughly with soap and water. 2. You may remove the patch earlier than 72 hours if you experience unpleasant side effects which may include dry mouth, dizziness or visual disturbances. 3. Avoid touching the patch. Wash your hands with soap and water after contact with the patch.     

## 2020-02-04 NOTE — Anesthesia Postprocedure Evaluation (Signed)
Anesthesia Post Note  Patient: Kevin [name]  Procedure(s) Performed: ETHMOIDECTOMY (Right Nose) MAXILLARY ANTROSTOMY WITH TISSUE REMOVAL (Right Nose)     Patient location during evaluation: PACU Anesthesia Type: General Level of consciousness: awake and alert Pain management: pain level controlled Vital Signs Assessment: post-procedure vital signs reviewed and stable Respiratory status: spontaneous breathing, nonlabored ventilation and respiratory function stable Cardiovascular status: blood pressure returned to baseline and stable Postop Assessment: no apparent nausea or vomiting Anesthetic complications: no   No complications documented.  Last Vitals:  Vitals:   02/04/20 1017 02/04/20 1030  BP:  (!) 149/64  Pulse: 63 60  Resp:    Temp:  37.1 C  SpO2: 96% 94%    Last Pain:  Vitals:   02/04/20 1030  TempSrc:   PainSc: 2                  Lynda Rainwater

## 2020-02-04 NOTE — H&P (Signed)
Cc: Right sinus mass  HPI: The patient is a 64 year old male who presents today for evaluation of his sinus mass.  The patient is seen in consultation requested by [facility].  According to the patient, he has a history of diffuse large B cell lymphoma.  He was treated with chemotherapy in 08/2019.  On his PET CT scan, he was noted to have right-sided sinus abnormalities.  He subsequently underwent a sinus CT scan.  The CT showed a soft tissue mass filling the right maxillary and ethmoid sinuses.  Currently, the patient denies any significant facial pain, pressure, purulent drainage, or fever.  He has no previous history of sinonasal surgery.  He also denies any significant facial change.    The patient's review of systems (constitutional, eyes, ENT, cardiovascular, respiratory, GI, musculoskeletal, skin, neurologic, psychiatric, endocrine, hematologic, allergic) is noted in the ROS questionnaire.  It is reviewed with the patient.  Family health history: Diabetes.  Major events: None.  Ongoing medical problems: Hypertension, lymphoma.  Social history: The patient is married. He is former smoker. He drinks beer on the weekends. He denies the use of illegal drugs.   Exam: General: Communicates without difficulty, well nourished, no acute distress. Head: Normocephalic, no evidence injury, no tenderness, facial buttresses intact without stepoff. Face/sinus: No tenderness to palpation and percussion. Facial movement is normal and symmetric. Eyes: PERRL, EOMI. No scleral icterus, conjunctivae clear. Neuro: CN II exam reveals vision grossly intact.  No nystagmus at any point of gaze. Ears: Auricles well formed without lesions.  Ear canals are intact without mass or lesion.  No erythema or edema is appreciated.  The TMs are intact without fluid. Nose: External evaluation reveals normal support and skin without lesions.  Dorsum is intact.  Anterior rhinoscopy reveals congested mucosa over anterior  aspect of inferior turbinates and intact septum.  No purulence noted. Oral:  Oral cavity and oropharynx are intact, symmetric, without erythema or edema.  Mucosa is moist without lesions. Neck: Full range of motion without pain.  There is no significant lymphadenopathy.  No masses palpable.  Thyroid bed within normal limits to palpation.  Parotid glands and submandibular glands equal bilaterally without mass.  Trachea is midline. Neuro:  CN 2-12 grossly intact. Gait normal.   Procedure:  Flexible Nasal Endoscopy: Description: Risks, benefits, and alternatives of flexible endoscopy were explained to the patient.  Specific mention was made of the risk of throat numbness with difficulty swallowing, possible bleeding from the nose and mouth, and pain from the procedure.  The patient gave oral consent to proceed.  The nasal cavities were decongested and anesthetised with a combination of oxymetazoline and 4% lidocaine solution.  The flexible scope was inserted into the right nasal cavity.  Endoscopy of the interior nasal cavity, superior, inferior, and middle meatus was performed. The sphenoid-ethmoid recess was examined. Edematous mucosa was noted.  Polypoid tissue was noted within the right middle meatus. Nasopharynx was clear.  Turbinates were hypertrophied but without mass. The patient tolerated the procedure well.  Instructions were given to avoid eating or drinking for 2 hours.   Assessment 1.  Soft tissue mass within the right maxillary and ethmoid sinuses.  2.  Polypoid tissue is noted within the right middle meatus.  Moderate nasal mucosal congestion is noted, consistent with chronic rhinitis.   3.  The patient has a history of large B cell lymphoma.  He was treated with chemotherapy earlier this year.    Plan  1.  The physical exam and nasal endoscopy findings are reviewed with the patient.  2.  The CT images are also extensively reviewed with the patient.   3.  Based on the above findings, the  patient will likely benefit from undergoing endoscopic sinus surgery to remove the right-sided sinus mass and to obtain definitive histologic diagnosis.   4.  The risks, benefits, alternatives and details of the procedure are reviewed with the patient.  Questions are invited and answered.  5.  The patient would like to proceed with the procedure.

## 2020-02-04 NOTE — Anesthesia Preprocedure Evaluation (Signed)
Anesthesia Evaluation  Patient identified by MRN, date of birth, ID band Patient awake    Reviewed: Allergy & Precautions, NPO status , Patient's Chart, lab work & pertinent test results  Airway Mallampati: II  TM Distance: >3 FB Neck ROM: Full    Dental no notable dental hx. (+) Poor Dentition, Missing, Chipped   Pulmonary neg pulmonary ROS, former smoker,    Pulmonary exam normal breath sounds clear to auscultation       Cardiovascular Exercise Tolerance: Good hypertension, Pt. on medications and Pt. on home beta blockers negative cardio ROS Normal cardiovascular examI Rhythm:Regular Rate:Normal  Known pulm HTN Wears o2 at night  Reports good ET Last EF normal    Neuro/Psych Anxiety Depression negative neurological ROS  negative psych ROS   GI/Hepatic negative GI ROS, Neg liver ROS,   Endo/Other  negative endocrine ROS  Renal/GU Renal diseaseRecently stented for hydronephrosis  negative genitourinary   Musculoskeletal negative musculoskeletal ROS (+)   Abdominal (+) + obese,   Peds negative pediatric ROS (+)  Hematology negative hematology ROS (+)   Anesthesia Other Findings Here for port for lymphoma   Reproductive/Obstetrics negative OB ROS                             Anesthesia Physical  Anesthesia Plan  ASA: III  Anesthesia Plan: General   Post-op Pain Management:    Induction: Intravenous  PONV Risk Score and Plan: 2 and Treatment may vary due to age or medical condition, Ondansetron and Midazolam  Airway Management Planned: Oral ETT  Additional Equipment:   Intra-op Plan:   Post-operative Plan: Extubation in OR  Informed Consent: I have reviewed the patients History and Physical, chart, labs and discussed the procedure including the risks, benefits and alternatives for the proposed anesthesia with the patient or authorized representative who has indicated his/her  understanding and acceptance.     Dental advisory given  Plan Discussed with: CRNA  Anesthesia Plan Comments:         Anesthesia Quick Evaluation

## 2020-02-07 ENCOUNTER — Encounter (HOSPITAL_BASED_OUTPATIENT_CLINIC_OR_DEPARTMENT_OTHER): Payer: Self-pay | Admitting: Otolaryngology

## 2020-02-07 ENCOUNTER — Ambulatory Visit: Payer: BLUE CROSS/BLUE SHIELD | Admitting: Gastroenterology

## 2020-02-07 LAB — SURGICAL PATHOLOGY

## 2020-02-11 DIAGNOSIS — J338 Other polyp of sinus: Secondary | ICD-10-CM | POA: Diagnosis not present

## 2020-02-11 DIAGNOSIS — J322 Chronic ethmoidal sinusitis: Secondary | ICD-10-CM | POA: Diagnosis not present

## 2020-02-11 DIAGNOSIS — J320 Chronic maxillary sinusitis: Secondary | ICD-10-CM | POA: Diagnosis not present

## 2020-02-16 ENCOUNTER — Ambulatory Visit (HOSPITAL_COMMUNITY): Payer: BLUE CROSS/BLUE SHIELD | Admitting: Hematology

## 2020-02-16 ENCOUNTER — Ambulatory Visit: Payer: BLUE CROSS/BLUE SHIELD | Admitting: Urology

## 2020-02-17 ENCOUNTER — Other Ambulatory Visit: Payer: Self-pay

## 2020-02-17 ENCOUNTER — Inpatient Hospital Stay (HOSPITAL_COMMUNITY): Payer: BLUE CROSS/BLUE SHIELD | Attending: Hematology | Admitting: Hematology

## 2020-02-17 ENCOUNTER — Ambulatory Visit (HOSPITAL_COMMUNITY)
Admission: RE | Admit: 2020-02-17 | Discharge: 2020-02-17 | Disposition: A | Discharge status: Home / Self Care | Payer: BLUE CROSS/BLUE SHIELD | Source: Ambulatory Visit | Attending: Urology | Admitting: Urology

## 2020-02-17 VITALS — BP 140/60 | HR 50 | Temp 97.7°F | Resp 18 | Wt 196.4 lb

## 2020-02-17 DIAGNOSIS — N1339 Other hydronephrosis: Secondary | ICD-10-CM | POA: Insufficient documentation

## 2020-02-17 DIAGNOSIS — Z7901 Long term (current) use of anticoagulants: Secondary | ICD-10-CM | POA: Insufficient documentation

## 2020-02-17 DIAGNOSIS — B449 Aspergillosis, unspecified: Secondary | ICD-10-CM | POA: Diagnosis not present

## 2020-02-17 DIAGNOSIS — I10 Essential (primary) hypertension: Secondary | ICD-10-CM | POA: Diagnosis not present

## 2020-02-17 DIAGNOSIS — Z87891 Personal history of nicotine dependence: Secondary | ICD-10-CM | POA: Diagnosis not present

## 2020-02-17 DIAGNOSIS — M7989 Other specified soft tissue disorders: Secondary | ICD-10-CM | POA: Insufficient documentation

## 2020-02-17 DIAGNOSIS — N1330 Unspecified hydronephrosis: Secondary | ICD-10-CM | POA: Diagnosis not present

## 2020-02-17 DIAGNOSIS — I82451 Acute embolism and thrombosis of right peroneal vein: Secondary | ICD-10-CM | POA: Insufficient documentation

## 2020-02-17 DIAGNOSIS — F419 Anxiety disorder, unspecified: Secondary | ICD-10-CM | POA: Insufficient documentation

## 2020-02-17 DIAGNOSIS — Z86718 Personal history of other venous thrombosis and embolism: Secondary | ICD-10-CM | POA: Insufficient documentation

## 2020-02-17 DIAGNOSIS — C8338 Diffuse large B-cell lymphoma, lymph nodes of multiple sites: Secondary | ICD-10-CM | POA: Insufficient documentation

## 2020-02-17 NOTE — Patient Instructions (Signed)
[place] at [facility] Discharge Instructions  You were seen today by Dr. Delton Coombes. He went over your recent results. You will be scheduled for a PET scan before your next visit. Dr. Delton Coombes will see you back in 1 month for labs and follow up.   Thank you for choosing [place] at [facility] to provide your oncology and hematology care.  To afford each patient quality time with our provider, please arrive at least 15 minutes before your scheduled appointment time.   If you have a lab appointment with the [place] please come in thru the Main Entrance and check in at the main information desk  You need to re-schedule your appointment should you arrive 10 or more minutes late.  We strive to give you quality time with our providers, and arriving late affects you and other patients whose appointments are after yours.  Also, if you no show three or more times for appointments you may be dismissed from the clinic at the providers discretion.     Again, thank you for choosing [facility].  Our hope is that these requests will decrease the amount of time that you wait before being seen by our physicians.       _____________________________________________________________  Should you have questions after your visit to [facility], please contact our office at [phone] between the hours of 8:00 a.m. and 4:30 p.m.  Voicemails left after 4:00 p.m. will not be returned until the following business day.  For prescription refill requests, have your pharmacy contact our office and allow 72 hours.    Cancer Center Support Programs:   > Cancer Support Group  2nd Tuesday of the month 1pm-2pm, Journey Room

## 2020-02-17 NOTE — Progress Notes (Signed)
[place], [place] [address]   CLINIC:  Medical Oncology/Hematology  PCP:  Loman [place], [place] / [place] [address]  [phone]  REASON FOR VISIT:  Follow-up for high-grade DLBCL  PRIOR THERAPY: R-EPOCH x 6 cycles from 05/11/2019 to 08/23/2019  CURRENT THERAPY: Observation  INTERVAL HISTORY:  [name], a 64 y.o. male, returns for routine follow-up for his high-grade DLBCL. Kevin [name] was last seen on 01/20/2020.  Today he reports that his ethmoidectomy on 8/6 went well and he is recovering after it. He is not having any black discharge or runny nose coming from his nose. He is tolerating the blood thinner well and denies any abnormal bleeding, including hematochezia or hematuria.   REVIEW OF SYSTEMS:  Review of Systems  Constitutional: Positive for fatigue (mild). Negative for appetite change.  Psychiatric/Behavioral: Positive for depression. The patient is nervous/anxious.   All other systems reviewed and are negative.   PAST MEDICAL/SURGICAL HISTORY:  Past Medical History:  Diagnosis Date  . Anxiety   . Depression   . Diffuse large B cell lymphoma ([place]) 04/26/2019  . Hyperlipidemia 02/01/2014  . Hypertension   . Leg DVT (deep venous thromboembolism), acute, bilateral ([place]) 04/18/2019  . Port-A-Cath in place 04/27/2019   Past Surgical History:  Procedure Laterality Date  . CYSTOSCOPY W/ URETERAL STENT PLACEMENT Bilateral 04/21/2019   Procedure: CYSTOSCOPY WITH RETROGRADE PYELOGRAM/URETERAL STENT PLACEMENT;  Surgeon: Cleon Gustin, MD;  Location: [facility];  Service: Urology;  Laterality: Bilateral;  . ETHMOIDECTOMY Right 02/04/2020   Procedure: ETHMOIDECTOMY;  Surgeon: Leta Baptist, MD;  Location: [facility];  Service: ENT;  Laterality: Right;  . INGUINAL LYMPH NODE BIOPSY Right 04/21/2019   Procedure: INGUINAL LYMPH NODE BIOPSY;  Surgeon: Virl Cagey, MD;  Location: [facility];  Service:  General;  Laterality: Right;  . MAXILLARY ANTROSTOMY Right 02/04/2020   Procedure: MAXILLARY ANTROSTOMY WITH TISSUE REMOVAL;  Surgeon: Leta Baptist, MD;  Location: [facility];  Service: ENT;  Laterality: Right;  . PORTACATH PLACEMENT Left 05/07/2019   Procedure: INSERTION PORT-A-CATH (catheter left subclavian);  Surgeon: Virl Cagey, MD;  Location: [facility];  Service: General;  Laterality: Left;    SOCIAL HISTORY:  Social History   Socioeconomic History  . Marital status: Married    Spouse name: Not on file  . Number of children: Not on file  . Years of education: Not on file  . Highest education level: Not on file  Occupational History  . Not on file  Tobacco Use  . Smoking status: Former Smoker    Quit date: 04/27/1999    Years since quitting: 20.8  . Smokeless tobacco: Never Used  Vaping Use  . Vaping Use: Never used  Substance and Sexual Activity  . Alcohol use: Yes    Alcohol/week: 56.0 standard drinks    Types: 56 Cans of beer per week    Comment: social  . Drug use: Never  . Sexual activity: Not on file  Other Topics Concern  . Not on file  Social History Narrative  . Not on file   Social Determinants of Health   Financial Resource Strain:   . Difficulty of Paying Living Expenses: Not on file  Food Insecurity:   . Worried About Charity fundraiser in the Last Year: Not on file  . Ran Out of Food in the Last Year: Not on file  Transportation Needs:   .  Lack of Transportation (Medical): Not on file  . Lack of Transportation (Non-Medical): Not on file  Physical Activity:   . Days of Exercise per Week: Not on file  . Minutes of Exercise per Session: Not on file  Stress:   . Feeling of Stress : Not on file  Social Connections:   . Frequency of Communication with Friends and Family: Not on file  . Frequency of Social Gatherings with Friends and Family: Not on file  . Attends Religious Services: Not on file  . Active Member of Clubs or  Organizations: Not on file  . Attends Archivist Meetings: Not on file  . Marital Status: Not on file  Intimate Partner Violence:   . Fear of Current or Ex-Partner: Not on file  . Emotionally Abused: Not on file  . Physically Abused: Not on file  . Sexually Abused: Not on file    FAMILY HISTORY:  Family History  Problem Relation Age of Onset  . Diabetes Brother   . Diabetes Mother   . Leukemia Brother   . Diabetes Son     CURRENT MEDICATIONS:  Current Outpatient Medications  Medication Sig Dispense Refill  . amLODipine (NORVASC) 10 MG tablet TAKE 1 TABLET BY MOUTH EVERY DAY 90 tablet 0  . escitalopram (LEXAPRO) 10 MG tablet Take 1 tablet (10 mg total) by mouth daily. 30 tablet 2  . furosemide (LASIX) 20 MG tablet TAKE 1 TABLET BY MOUTH EVERY DAY AS NEEDED 90 tablet 3  . LORazepam (ATIVAN) 0.5 MG tablet TAKE 1 TABLET (0.5 MG TOTAL) BY MOUTH EVERY 8 (EIGHT) HOURS. 60 tablet 2  . losartan (COZAAR) 100 MG tablet TAKE 1 TABLET BY MOUTH EVERY DAY 90 tablet 0  . metoprolol succinate (TOPROL-XL) 50 MG 24 hr tablet TAKE 1 TABLET BY MOUTH EVERY DAY 90 tablet 0   No current facility-administered medications for this visit.   Facility-Administered Medications Ordered in Other Visits  Medication Dose Route Frequency Provider Last Rate Last Admin  . 0.9 %  sodium chloride infusion   Intravenous Continuous Derek Jack, MD   Stopped at 06/04/19 1304  . 0.9 %  sodium chloride infusion   Intravenous Continuous Derek Jack, MD   Stopped at 08/04/19 1225  . 0.9 %  sodium chloride infusion   Intravenous Continuous Derek Jack, MD 50 mL/hr at 08/05/19 1134 New Bag at 08/05/19 1134  . 0.9 %  sodium chloride infusion   Intravenous Continuous Derek Jack, MD 20 mL/hr at 08/26/19 1105 New Bag at 08/26/19 1105  . sodium chloride flush (NS) 0.9 % injection 10 mL  10 mL Intravenous PRN Derek Jack, MD   10 mL at 08/04/19 1155  . sodium chloride flush  (NS) 0.9 % injection 10 mL  10 mL Intravenous PRN Derek Jack, MD   10 mL at 08/26/19 1104    ALLERGIES:  No Known Allergies  PHYSICAL EXAM:  Performance status (ECOG): 1 - Symptomatic but completely ambulatory  Vitals:   02/17/20 1346  BP: 140/60  Pulse: (!) 50  Resp: 18  Temp: 97.7 F (36.5 C)  SpO2: 98%   Wt Readings from Last 3 Encounters:  02/17/20 196 lb 6.9 oz (89.1 kg)  02/04/20 191 lb 12.8 oz (87 kg)  01/25/20 195 lb 9.6 oz (88.7 kg)   Physical Exam Vitals reviewed.  Constitutional:      Appearance: Normal appearance.  Cardiovascular:     Rate and Rhythm: Normal rate and regular rhythm.  Pulses: Normal pulses.     Heart sounds: Normal heart sounds.  Pulmonary:     Effort: Pulmonary effort is normal.     Breath sounds: Normal breath sounds.  Abdominal:     Palpations: Abdomen is soft. There is no mass.     Tenderness: There is no abdominal tenderness.  Musculoskeletal:     Right lower leg: No edema.     Left lower leg: No edema.  Lymphadenopathy:     Cervical: No cervical adenopathy.     Upper Body:     Right upper body: No supraclavicular or axillary adenopathy.     Left upper body: No supraclavicular or axillary adenopathy.  Neurological:     General: No focal deficit present.     Mental Status: He is alert and oriented to person, place, and time.  Psychiatric:        Mood and Affect: Mood normal.        Behavior: Behavior normal.     LABORATORY DATA:  I have reviewed the labs as listed.  CBC Latest Ref Rng & Units 12/29/2019 11/24/2019 10/25/2019  WBC 4.0 - 10.5 K/uL 4.5 5.3 5.3  Hemoglobin 13.0 - 17.0 g/dL 12.5(L) 11.0(L) 9.2(L)  Hematocrit 39 - 52 % 39.2 35.7(L) 30.1(L)  Platelets 150 - 400 K/uL 183 200 184   CMP Latest Ref Rng & Units 02/01/2020 12/29/2019 11/24/2019  Glucose 70 - 99 mg/dL 117(H) 117(H) 98  BUN 8 - 23 mg/dL 17 17 15   Creatinine 0.61 - 1.24 mg/dL 0.77 0.83 0.73  Sodium 135 - 145 mmol/L 136 140 138  Potassium 3.5 -  5.1 mmol/L 5.1 4.4 4.3  Chloride 98 - 111 mmol/L 100 104 101  CO2 22 - 32 mmol/L 28 29 29   Calcium 8.9 - 10.3 mg/dL 9.2 9.4 9.3  Total Protein 6.5 - 8.1 g/dL - 6.2(L) 6.2(L)  Total Bilirubin 0.3 - 1.2 mg/dL - 0.6 0.5  Alkaline Phos 38 - 126 U/L - 90 76  AST 15 - 41 U/L - 19 13(L)  ALT 0 - 44 U/L - 13 13      Component Value Date/Time   RBC 4.59 12/29/2019 1050   MCV 85.4 12/29/2019 1050   MCH 27.2 12/29/2019 1050   MCHC 31.9 12/29/2019 1050   RDW 14.4 12/29/2019 1050   LYMPHSABS 1.1 12/29/2019 1050   MONOABS 0.6 12/29/2019 1050   EOSABS 0.3 12/29/2019 1050   BASOSABS 0.0 12/29/2019 1050   Lab Results  Component Value Date   TIBC 335 11/24/2019   TIBC 284 10/25/2019   FERRITIN 220 11/24/2019   FERRITIN 236 10/25/2019   FERRITIN 318 04/17/2019   IRONPCTSAT 10 (L) 11/24/2019   IRONPCTSAT 8 (L) 10/25/2019   Surgical pathology ([id number]) on 02/04/2020: Right sinus mass, excision: 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  DIAGNOSTIC IMAGING:  I have independently reviewed the scans and discussed with the patient. No results found.   ASSESSMENT:  1. 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 -6 cycles of R-EPOCH from 05/11/2019 through 08/23/2019. -PET scan on 09/17/2019 showed central abdominal mass with SUV 2.9, measuring 10 x 3.7 cm. Mildly enlarged right pelvic sidewall lymph node 12 mm SUV 1.7. Areas of multifocal bone involvement with near complete resolution. [place] 2/3 in the five-point scale. -He was evaluated by lymphoma specialist Dr.  [facility]. His case was discussed at tumor board. No adjuvant radiation therapy or maintenance rituximab or transplant was recommended at this time. -PET scan on 12/14/2019 shows soft  tissue and nodularity at the root and within the small bowel mesentery, grossly similar measuring 4.2 x 11.2 cm with SUV max 3.6, similar to 3.2 previously. External iliac and inguinal lymph nodes do not show metabolic some low blood pool. SUV 2.3. -Enlarging rounded soft tissue in the right maxillary sinus with periosteal thickening and mild hypermetabolic zone.  2. Bilateral leg DVT: -Ultrasound Doppler on 04/18/2019 showed bilateral leg DVT. -He is on Eliquis since then. -Repeat Doppler on 12/29/2019 shows previous extensive right lower extremity DVT has resolved with minor residual nonocclusive right peroneal thrombus noted.  Very minimal residual thrombus burden.  Negative left leg DVT.  3.  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   PLAN:  1. Double hit lymphoma: -Previous labs including LDH were within normal limits. -He does not have any B symptoms or palpable lymphadenopathy. -He is continue to work full-time job. -I plan to see him back in 1 month with repeat PET scan.  2. Bilateral leg DVT: -Continue Eliquis at this time.  No bleeding issues.  3. Anxiety: -Continue Ativan as needed.  4. Lower extremity swelling: -Continue Lasix as needed..  5. Hypertension: -Continue losartan, Toprol-XL, Norvasc.  6.  Aspergilloma of the right maxillary sinus: -We discussed pathology report.  He has mild rhinitis but does not have any pain. -I have reviewed literature.  Recurrence after surgical removal are uncommon.  Antifungal agents do not have a clear benefit.  Orders placed this encounter:  No orders of the defined types were placed in this encounter.    Derek Jack, MD [place] [phone]   I, Milinda Antis, am acting as a scribe for Dr. Sanda Linger.  I, Derek Jack MD, have reviewed the above  documentation for accuracy and completeness, and I agree with the above.

## 2020-02-19 ENCOUNTER — Other Ambulatory Visit: Payer: Self-pay | Admitting: Family Medicine

## 2020-02-19 DIAGNOSIS — F419 Anxiety disorder, unspecified: Secondary | ICD-10-CM

## 2020-02-22 NOTE — Progress Notes (Signed)
Letter sent.

## 2020-03-03 DIAGNOSIS — J322 Chronic ethmoidal sinusitis: Secondary | ICD-10-CM | POA: Diagnosis not present

## 2020-03-03 DIAGNOSIS — J320 Chronic maxillary sinusitis: Secondary | ICD-10-CM | POA: Diagnosis not present

## 2020-03-03 DIAGNOSIS — J338 Other polyp of sinus: Secondary | ICD-10-CM | POA: Diagnosis not present

## 2020-03-08 ENCOUNTER — Ambulatory Visit: Payer: BLUE CROSS/BLUE SHIELD | Admitting: Urology

## 2020-03-09 ENCOUNTER — Other Ambulatory Visit: Payer: Self-pay

## 2020-03-09 ENCOUNTER — Ambulatory Visit (INDEPENDENT_AMBULATORY_CARE_PROVIDER_SITE_OTHER): Payer: BLUE CROSS/BLUE SHIELD | Admitting: Family Medicine

## 2020-03-09 ENCOUNTER — Encounter: Payer: Self-pay | Admitting: Family Medicine

## 2020-03-09 VITALS — BP 158/62 | HR 58 | Temp 98.0°F | Ht 66.0 in | Wt 201.6 lb

## 2020-03-09 DIAGNOSIS — F419 Anxiety disorder, unspecified: Secondary | ICD-10-CM

## 2020-03-09 DIAGNOSIS — I10 Essential (primary) hypertension: Secondary | ICD-10-CM

## 2020-03-09 DIAGNOSIS — E785 Hyperlipidemia, unspecified: Secondary | ICD-10-CM | POA: Diagnosis not present

## 2020-03-09 NOTE — Patient Instructions (Signed)
DASH Eating Plan DASH stands for "Dietary Approaches to Stop Hypertension." The DASH eating plan is a healthy eating plan that has been shown to reduce high blood pressure (hypertension). It may also reduce your risk for type 2 diabetes, heart disease, and stroke. The DASH eating plan may also help with weight loss. What are tips for following this plan?  General guidelines  Avoid eating more than 2,300 mg (milligrams) of salt (sodium) a day. If you have hypertension, you may need to reduce your sodium intake to 1,500 mg a day.  Limit alcohol intake to no more than 1 drink a day for nonpregnant women and 2 drinks a day for men. One drink equals 12 oz of beer, 5 oz of wine, or 1 oz of hard liquor.  Work with your health care provider to maintain a healthy body weight or to lose weight. Ask what an ideal weight is for you.  Get at least 30 minutes of exercise that causes your heart to beat faster (aerobic exercise) most days of the week. Activities may include walking, swimming, or biking.  Work with your health care provider or diet and nutrition specialist (dietitian) to adjust your eating plan to your individual calorie needs. Reading food labels   Check food labels for the amount of sodium per serving. Choose foods with less than 5 percent of the Daily Value of sodium. Generally, foods with less than 300 mg of sodium per serving fit into this eating plan.  To find whole grains, look for the word "whole" as the first word in the ingredient list. Shopping  Buy products labeled as "low-sodium" or "no salt added."  Buy fresh foods. Avoid canned foods and premade or frozen meals. Cooking  Avoid adding salt when cooking. Use salt-free seasonings or herbs instead of table salt or sea salt. Check with your health care provider or pharmacist before using salt substitutes.  Do not fry foods. Cook foods using healthy methods such as baking, boiling, grilling, and broiling instead.  Cook with  heart-healthy oils, such as olive, canola, soybean, or sunflower oil. Meal planning  Eat a balanced diet that includes: ? 5 or more servings of fruits and vegetables each day. At each meal, try to fill half of your plate with fruits and vegetables. ? Up to 6-8 servings of whole grains each day. ? Less than 6 oz of lean meat, poultry, or fish each day. A 3-oz serving of meat is about the same size as a deck of cards. One egg equals 1 oz. ? 2 servings of low-fat dairy each day. ? A serving of nuts, seeds, or beans 5 times each week. ? Heart-healthy fats. Healthy fats called Omega-3 fatty acids are found in foods such as flaxseeds and coldwater fish, like sardines, salmon, and mackerel.  Limit how much you eat of the following: ? Canned or prepackaged foods. ? Food that is high in trans fat, such as fried foods. ? Food that is high in saturated fat, such as fatty meat. ? Sweets, desserts, sugary drinks, and other foods with added sugar. ? Full-fat dairy products.  Do not salt foods before eating.  Try to eat at least 2 vegetarian meals each week.  Eat more home-cooked food and less restaurant, buffet, and fast food.  When eating at a restaurant, ask that your food be prepared with less salt or no salt, if possible. What foods are recommended? The items listed may not be a complete list. Talk with your dietitian about   what dietary choices are best for you. Grains Whole-grain or whole-wheat bread. Whole-grain or whole-wheat pasta. Brown rice. Oatmeal. Quinoa. Bulgur. Whole-grain and low-sodium cereals. Pita bread. Low-fat, low-sodium crackers. Whole-wheat flour tortillas. Vegetables Fresh or frozen vegetables (raw, steamed, roasted, or grilled). Low-sodium or reduced-sodium tomato and vegetable juice. Low-sodium or reduced-sodium tomato sauce and tomato paste. Low-sodium or reduced-sodium canned vegetables. Fruits All fresh, dried, or frozen fruit. Canned fruit in natural juice (without  added sugar). Meat and other protein foods Skinless chicken or turkey. Ground chicken or turkey. Pork with fat trimmed off. Fish and seafood. Egg whites. Dried beans, peas, or lentils. Unsalted nuts, nut butters, and seeds. Unsalted canned beans. Lean cuts of beef with fat trimmed off. Low-sodium, lean deli meat. Dairy Low-fat (1%) or fat-free (skim) milk. Fat-free, low-fat, or reduced-fat cheeses. Nonfat, low-sodium ricotta or cottage cheese. Low-fat or nonfat yogurt. Low-fat, low-sodium cheese. Fats and oils Soft margarine without trans fats. Vegetable oil. Low-fat, reduced-fat, or light mayonnaise and salad dressings (reduced-sodium). Canola, safflower, olive, soybean, and sunflower oils. Avocado. Seasoning and other foods Herbs. Spices. Seasoning mixes without salt. Unsalted popcorn and pretzels. Fat-free sweets. What foods are not recommended? The items listed may not be a complete list. Talk with your dietitian about what dietary choices are best for you. Grains Baked goods made with fat, such as croissants, muffins, or some breads. Dry pasta or rice meal packs. Vegetables Creamed or fried vegetables. Vegetables in a cheese sauce. Regular canned vegetables (not low-sodium or reduced-sodium). Regular canned tomato sauce and paste (not low-sodium or reduced-sodium). Regular tomato and vegetable juice (not low-sodium or reduced-sodium). Pickles. Olives. Fruits Canned fruit in a light or heavy syrup. Fried fruit. Fruit in cream or butter sauce. Meat and other protein foods Fatty cuts of meat. Ribs. Fried meat. Bacon. Sausage. Bologna and other processed lunch meats. Salami. Fatback. Hotdogs. Bratwurst. Salted nuts and seeds. Canned beans with added salt. Canned or smoked fish. Whole eggs or egg yolks. Chicken or turkey with skin. Dairy Whole or 2% milk, cream, and half-and-half. Whole or full-fat cream cheese. Whole-fat or sweetened yogurt. Full-fat cheese. Nondairy creamers. Whipped toppings.  Processed cheese and cheese spreads. Fats and oils Butter. Stick margarine. Lard. Shortening. Ghee. Bacon fat. Tropical oils, such as coconut, palm kernel, or palm oil. Seasoning and other foods Salted popcorn and pretzels. Onion salt, garlic salt, seasoned salt, table salt, and sea salt. Worcestershire sauce. Tartar sauce. Barbecue sauce. Teriyaki sauce. Soy sauce, including reduced-sodium. Steak sauce. Canned and packaged gravies. Fish sauce. Oyster sauce. Cocktail sauce. Horseradish that you find on the shelf. Ketchup. Mustard. Meat flavorings and tenderizers. Bouillon cubes. Hot sauce and Tabasco sauce. Premade or packaged marinades. Premade or packaged taco seasonings. Relishes. Regular salad dressings. Where to find more information:  National Heart, Lung, and Blood Institute: www.nhlbi.nih.gov  American Heart Association: www.heart.org Summary  The DASH eating plan is a healthy eating plan that has been shown to reduce high blood pressure (hypertension). It may also reduce your risk for type 2 diabetes, heart disease, and stroke.  With the DASH eating plan, you should limit salt (sodium) intake to 2,300 mg a day. If you have hypertension, you may need to reduce your sodium intake to 1,500 mg a day.  When on the DASH eating plan, aim to eat more fresh fruits and vegetables, whole grains, lean proteins, low-fat dairy, and heart-healthy fats.  Work with your health care provider or diet and nutrition specialist (dietitian) to adjust your eating plan to your   individual calorie needs. This information is not intended to replace advice given to you by your health care provider. Make sure you discuss any questions you have with your health care provider. Document Revised: 05/30/2017 Document Reviewed: 06/10/2016 Elsevier Patient Education  2020 [employer]  

## 2020-03-09 NOTE — Progress Notes (Signed)
Assessment & Plan:  1. Anxiety - Well controlled on current regimen.   2. Essential hypertension - Elevated today but patient reports it hasn't been. He will monitor at home. Education provided on the DASH diet.   3. Hyperlipidemia, unspecified hyperlipidemia type - Lipid panel; Future   Return in about 3 months (around 06/08/2020) for follow-up of chronic medication conditions.  Hendricks Limes, MSN, APRN, FNP-C [facility]  Subjective:    Patient ID: Kevin [name], male    DOB: 1956/06/06, 64 y.o.   MRN: [id number]  Patient Care Team: Loman Brooklyn, FNP as PCP - General (Family Medicine)   Chief Complaint:  Chief Complaint  Patient presents with  . Anxiety    6 week follow up. Patient states it is alot better.    HPI: Kevin [name] is a 64 y.o. male presenting on 03/09/2020 for Anxiety (6 week follow up. Patient states it is alot better.)  Patient is here for a 6-week follow-up of anxiety.  He was started on Lexapro 10 mg once daily which he reports has been very helpful.  He does not feel he needs a dosage increase.  New complaints: None  Social history:  Relevant past medical, surgical, family and social history reviewed and updated as indicated. Interim medical history since our last visit reviewed.  Allergies and medications reviewed and updated.  DATA REVIEWED: CHART IN EPIC  ROS: Negative unless specifically indicated above in HPI.    Current Outpatient Medications:  .  amLODipine (NORVASC) 10 MG tablet, TAKE 1 TABLET BY MOUTH EVERY DAY, Disp: 90 tablet, Rfl: 0 .  escitalopram (LEXAPRO) 10 MG tablet, TAKE 1 TABLET BY MOUTH EVERY DAY, Disp: 90 tablet, Rfl: 0 .  furosemide (LASIX) 20 MG tablet, TAKE 1 TABLET BY MOUTH EVERY DAY AS NEEDED, Disp: 90 tablet, Rfl: 3 .  LORazepam (ATIVAN) 0.5 MG tablet, TAKE 1 TABLET (0.5 MG TOTAL) BY MOUTH EVERY 8 (EIGHT) HOURS., Disp: 60 tablet, Rfl: 2 .  losartan (COZAAR) 100 MG tablet, TAKE 1 TABLET  BY MOUTH EVERY DAY, Disp: 90 tablet, Rfl: 0 .  metoprolol succinate (TOPROL-XL) 50 MG 24 hr tablet, TAKE 1 TABLET BY MOUTH EVERY DAY, Disp: 90 tablet, Rfl: 0 No current facility-administered medications for this visit.  Facility-Administered Medications Ordered in Other Visits:  .  0.9 %  sodium chloride infusion, , Intravenous, Continuous, Derek Jack, MD, Stopped at 06/04/19 1304 .  0.9 %  sodium chloride infusion, , Intravenous, Continuous, Derek Jack, MD, Stopped at 08/04/19 1225 .  0.9 %  sodium chloride infusion, , Intravenous, Continuous, Derek Jack, MD, Last Rate: 50 mL/hr at 08/05/19 1134, New Bag at 08/05/19 1134 .  0.9 %  sodium chloride infusion, , Intravenous, Continuous, Derek Jack, MD, Last Rate: 20 mL/hr at 08/26/19 1105, New Bag at 08/26/19 1105 .  sodium chloride flush (NS) 0.9 % injection 10 mL, 10 mL, Intravenous, PRN, Derek Jack, MD, 10 mL at 08/04/19 1155 .  sodium chloride flush (NS) 0.9 % injection 10 mL, 10 mL, Intravenous, PRN, Derek Jack, MD, 10 mL at 08/26/19 1104   No Known Allergies Past Medical History:  Diagnosis Date  . Anxiety   . Depression   . 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 Hyperlipidemia 02/01/2014  . Hypertension   . Leg DVT (deep venous thromboembolism), acute, bilateral ([place]) 04/18/2019  . Port-A-Cath in place 04/27/2019    Past Surgical History:  Procedure Laterality Date  . CYSTOSCOPY W/  URETERAL STENT PLACEMENT Bilateral 04/21/2019   Procedure: CYSTOSCOPY WITH RETROGRADE PYELOGRAM/URETERAL STENT PLACEMENT;  Surgeon: Cleon Gustin, MD;  Location: [facility];  Service: Urology;  Laterality: Bilateral;  . ETHMOIDECTOMY Right 02/04/2020   Procedure: ETHMOIDECTOMY;  Surgeon: Leta Baptist, MD;  Location: [place];  Service: ENT;  Laterality: Right;  . INGUINAL LYMPH NODE BIOPSY Right 04/21/2019   Procedure: INGUINAL LYMPH NODE BIOPSY;  Surgeon: Virl Cagey, MD;  Location: [facility];  Service: General;  Laterality: Right;  . MAXILLARY ANTROSTOMY Right 02/04/2020   Procedure: MAXILLARY ANTROSTOMY WITH TISSUE REMOVAL;  Surgeon: Leta Baptist, MD;  Location: [place];  Service: ENT;  Laterality: Right;  . PORTACATH PLACEMENT Left 05/07/2019   Procedure: INSERTION PORT-A-CATH (catheter left subclavian);  Surgeon: Virl Cagey, MD;  Location: [facility];  Service: General;  Laterality: Left;    Social History   Socioeconomic History  . Marital status: Married    Spouse name: Not on file  . Number of children: Not on file  . Years of education: Not on file  . Highest education level: Not on file  Occupational History  . Not on file  Tobacco Use  . Smoking status: Former Smoker    Quit date: 04/27/1999    Years since quitting: 20.8  . Smokeless tobacco: Never Used  Vaping Use  . Vaping Use: Never used  Substance and Sexual Activity  . Alcohol use: Yes    Alcohol/week: 56.0 standard drinks    Types: 56 Cans of beer per week    Comment: social  . Drug use: Never  . Sexual activity: Not on file  Other Topics Concern  . Not on file  Social History Narrative  . Not on file   Social Determinants of Health   Financial Resource Strain:   . Difficulty of Paying Living Expenses: Not on file  Food Insecurity:   . Worried About Charity fundraiser in the Last Year: Not on file  . Ran Out of Food in the Last Year: Not on file  Transportation Needs:   . Lack of Transportation (Medical): Not on file  . Lack of Transportation (Non-Medical): Not on file  Physical Activity:   . Days of Exercise per Week: Not on file  . Minutes of Exercise per Session: Not on file  Stress:   . Feeling of Stress : Not on file  Social Connections:   . Frequency of Communication with Friends and Family: Not on file  . Frequency of Social Gatherings with Friends and Family: Not on file  . Attends Religious Services: Not on file  . Active Member of  Clubs or Organizations: Not on file  . Attends Archivist Meetings: Not on file  . Marital Status: Not on file  Intimate Partner Violence:   . Fear of Current or Ex-Partner: Not on file  . Emotionally Abused: Not on file  . Physically Abused: Not on file  . Sexually Abused: Not on file        Objective:    BP (!) 158/62   Pulse (!) 58   Temp 98 F (36.7 C) (Temporal)   Ht 5\' 6"  (1.676 m)   Wt 201 lb 9.6 oz (91.4 kg)   SpO2 96%   BMI 32.54 kg/m   Wt Readings from Last 3 Encounters:  03/09/20 201 lb 9.6 oz (91.4 kg)  02/17/20 196 lb 6.9 oz (89.1 kg)  02/04/20 191 lb 12.8 oz (87 kg)  Physical Exam Vitals reviewed.  Constitutional:      General: He is not in acute distress.    Appearance: Normal appearance. He is obese. He is not ill-appearing, toxic-appearing or diaphoretic.  HENT:     Head: Normocephalic and atraumatic.  Eyes:     General: No scleral icterus.       Right eye: No discharge.        Left eye: No discharge.     Conjunctiva/sclera: Conjunctivae normal.  Cardiovascular:     Rate and Rhythm: Normal rate and regular rhythm.     Heart sounds: Normal heart sounds. No murmur heard.  No friction rub. No gallop.   Pulmonary:     Effort: Pulmonary effort is normal. No respiratory distress.     Breath sounds: Normal breath sounds. No stridor. No wheezing, rhonchi or rales.  Musculoskeletal:        General: Normal range of motion.     Cervical back: Normal range of motion.  Skin:    General: Skin is warm and dry.  Neurological:     Mental Status: He is alert and oriented to person, place, and time. Mental status is at baseline.  Psychiatric:        Mood and Affect: Mood normal.        Behavior: Behavior normal.        Thought Content: Thought content normal.        Judgment: Judgment normal.     No results found for: TSH Lab Results  Component Value Date   WBC 4.5 12/29/2019   HGB 12.5 (L) 12/29/2019   HCT 39.2 12/29/2019   MCV 85.4  12/29/2019   PLT 183 12/29/2019   Lab Results  Component Value Date   NA 136 02/01/2020   K 5.1 02/01/2020   CO2 28 02/01/2020   GLUCOSE 117 (H) 02/01/2020   BUN 17 02/01/2020   CREATININE 0.77 02/01/2020   BILITOT 0.6 12/29/2019   ALKPHOS 90 12/29/2019   AST 19 12/29/2019   ALT 13 12/29/2019   PROT 6.2 (L) 12/29/2019   ALBUMIN 4.0 12/29/2019   CALCIUM 9.2 02/01/2020   ANIONGAP 8 02/01/2020   No results found for: CHOL No results found for: HDL No results found for: [facility] Lab Results  Component Value Date   TRIG 181 (H) 04/17/2019   No results found for: CHOLHDL No results found for: HGBA1C

## 2020-03-12 ENCOUNTER — Encounter: Payer: Self-pay | Admitting: Family Medicine

## 2020-03-12 ENCOUNTER — Other Ambulatory Visit (HOSPITAL_COMMUNITY): Payer: Self-pay | Admitting: Hematology

## 2020-03-12 DIAGNOSIS — F419 Anxiety disorder, unspecified: Secondary | ICD-10-CM | POA: Insufficient documentation

## 2020-03-23 ENCOUNTER — Other Ambulatory Visit: Payer: Self-pay

## 2020-03-23 ENCOUNTER — Inpatient Hospital Stay (HOSPITAL_COMMUNITY): Payer: Medicare Other | Attending: Hematology

## 2020-03-23 DIAGNOSIS — I82451 Acute embolism and thrombosis of right peroneal vein: Secondary | ICD-10-CM | POA: Diagnosis not present

## 2020-03-23 DIAGNOSIS — Z7901 Long term (current) use of anticoagulants: Secondary | ICD-10-CM | POA: Insufficient documentation

## 2020-03-23 DIAGNOSIS — I10 Essential (primary) hypertension: Secondary | ICD-10-CM | POA: Diagnosis not present

## 2020-03-23 DIAGNOSIS — F419 Anxiety disorder, unspecified: Secondary | ICD-10-CM | POA: Diagnosis not present

## 2020-03-23 DIAGNOSIS — C8338 Diffuse large B-cell lymphoma, lymph nodes of multiple sites: Secondary | ICD-10-CM

## 2020-03-23 DIAGNOSIS — Z87891 Personal history of nicotine dependence: Secondary | ICD-10-CM | POA: Diagnosis not present

## 2020-03-23 LAB — COMPREHENSIVE METABOLIC PANEL
ALT: 15 U/L (ref 0–44)
AST: 17 U/L (ref 15–41)
Albumin: 3.9 g/dL (ref 3.5–5.0)
Alkaline Phosphatase: 83 U/L (ref 38–126)
Anion gap: 6 (ref 5–15)
BUN: 16 mg/dL (ref 8–23)
CO2: 28 mmol/L (ref 22–32)
Calcium: 9 mg/dL (ref 8.9–10.3)
Chloride: 106 mmol/L (ref 98–111)
Creatinine, Ser: 0.74 mg/dL (ref 0.61–1.24)
GFR calc Af Amer: >60 mL/min (ref >60)
GFR calc non Af Amer: >60 mL/min (ref >60)
Glucose, Bld: 114 mg/dL — ABNORMAL HIGH (ref 70–99)
Potassium: 4.1 mmol/L (ref 3.5–5.1)
Sodium: 140 mmol/L (ref 135–145)
Total Bilirubin: 0.5 mg/dL (ref 0.3–1.2)
Total Protein: 6.3 g/dL — ABNORMAL LOW (ref 6.5–8.1)

## 2020-03-23 LAB — CBC WITH DIFFERENTIAL/PLATELET
Abs Immature Granulocytes: 0.08 10*3/uL — ABNORMAL HIGH (ref 0.00–0.07)
Basophils Absolute: 0 10*3/uL (ref 0.0–0.1)
Basophils Relative: 1 %
Eosinophils Absolute: 0.3 10*3/uL (ref 0.0–0.5)
Eosinophils Relative: 4 %
HCT: 41.3 % (ref 39.0–52.0)
Hemoglobin: 13.4 g/dL (ref 13.0–17.0)
Immature Granulocytes: 1 %
Lymphocytes Relative: 19 %
Lymphs Abs: 1.1 10*3/uL (ref 0.7–4.0)
MCH: 30.6 pg (ref 26.0–34.0)
MCHC: 32.4 g/dL (ref 30.0–36.0)
MCV: 94.3 fL (ref 80.0–100.0)
Monocytes Absolute: 0.6 10*3/uL (ref 0.1–1.0)
Monocytes Relative: 11 %
Neutro Abs: 3.7 10*3/uL (ref 1.7–7.7)
Neutrophils Relative %: 64 %
Platelets: 197 10*3/uL (ref 150–400)
RBC: 4.38 MIL/uL (ref 4.22–5.81)
RDW: 14 % (ref 11.5–15.5)
WBC: 5.8 10*3/uL (ref 4.0–10.5)
nRBC: 0 % (ref 0.0–0.2)

## 2020-03-23 LAB — LACTATE DEHYDROGENASE: LDH: 150 U/L (ref 98–192)

## 2020-03-27 ENCOUNTER — Ambulatory Visit (HOSPITAL_COMMUNITY)
Admission: RE | Admit: 2020-03-27 | Discharge: 2020-03-27 | Disposition: A | Discharge status: Home / Self Care | Payer: Medicare Other | Source: Ambulatory Visit | Attending: Hematology | Admitting: Hematology

## 2020-03-27 ENCOUNTER — Other Ambulatory Visit: Payer: Self-pay

## 2020-03-27 DIAGNOSIS — D3500 Benign neoplasm of unspecified adrenal gland: Secondary | ICD-10-CM | POA: Diagnosis not present

## 2020-03-27 DIAGNOSIS — C8330 Diffuse large B-cell lymphoma, unspecified site: Secondary | ICD-10-CM | POA: Diagnosis not present

## 2020-03-27 DIAGNOSIS — C8338 Diffuse large B-cell lymphoma, lymph nodes of multiple sites: Secondary | ICD-10-CM | POA: Diagnosis not present

## 2020-03-27 DIAGNOSIS — R220 Localized swelling, mass and lump, head: Secondary | ICD-10-CM | POA: Diagnosis not present

## 2020-03-27 MED ORDER — FLUDEOXYGLUCOSE F - 18 (FDG) INJECTION
11.8000 | Freq: Once | INTRAVENOUS | Status: AC | PRN
  Administered 2020-03-27: 11.8 via INTRAVENOUS

## 2020-03-29 ENCOUNTER — Inpatient Hospital Stay (HOSPITAL_BASED_OUTPATIENT_CLINIC_OR_DEPARTMENT_OTHER): Payer: Medicare Other | Admitting: Hematology

## 2020-03-29 ENCOUNTER — Encounter (HOSPITAL_COMMUNITY): Payer: Self-pay | Admitting: Lab

## 2020-03-29 ENCOUNTER — Other Ambulatory Visit: Payer: Self-pay

## 2020-03-29 VITALS — BP 147/52 | HR 54 | Temp 97.5°F | Resp 18 | Wt 203.3 lb

## 2020-03-29 DIAGNOSIS — F419 Anxiety disorder, unspecified: Secondary | ICD-10-CM | POA: Diagnosis not present

## 2020-03-29 DIAGNOSIS — C8338 Diffuse large B-cell lymphoma, lymph nodes of multiple sites: Secondary | ICD-10-CM | POA: Diagnosis not present

## 2020-03-29 DIAGNOSIS — Z87891 Personal history of nicotine dependence: Secondary | ICD-10-CM | POA: Diagnosis not present

## 2020-03-29 DIAGNOSIS — I82451 Acute embolism and thrombosis of right peroneal vein: Secondary | ICD-10-CM | POA: Diagnosis not present

## 2020-03-29 DIAGNOSIS — I10 Essential (primary) hypertension: Secondary | ICD-10-CM | POA: Diagnosis not present

## 2020-03-29 DIAGNOSIS — Z7901 Long term (current) use of anticoagulants: Secondary | ICD-10-CM | POA: Diagnosis not present

## 2020-03-29 NOTE — Patient Instructions (Signed)
[place] at [facility] Discharge Instructions  You were seen today by Dr. Delton Coombes. He went over your recent results and scans. You will be scheduled for an ultrasound of your legs to check for clots. You will also be scheduled for a PET scan. Dr. Delton Coombes will see you back in 4 months for labs and follow up.   Thank you for choosing [place] at [facility] to provide your oncology and hematology care.  To afford each patient quality time with our provider, please arrive at least 15 minutes before your scheduled appointment time.   If you have a lab appointment with the [facility] please come in thru the Main Entrance and check in at the main information desk  You need to re-schedule your appointment should you arrive 10 or more minutes late.  We strive to give you quality time with our providers, and arriving late affects you and other patients whose appointments are after yours.  Also, if you no show three or more times for appointments you may be dismissed from the clinic at the providers discretion.     Again, thank you for choosing [facility].  Our hope is that these requests will decrease the amount of time that you wait before being seen by our physicians.       _____________________________________________________________  Should you have questions after your visit to [facility], please contact our office at [phone] between the hours of 8:00 a.m. and 4:30 p.m.  Voicemails left after 4:00 p.m. will not be returned until the following business day.  For prescription refill requests, have your pharmacy contact our office and allow 72 hours.    Cancer Center Support Programs:   > Cancer Support Group  2nd Tuesday of the month 1pm-2pm, Journey Room

## 2020-03-29 NOTE — Progress Notes (Signed)
Patient is aware that Medicare and [place] Medicare has his name listed as Kevin [name] and his DOB as March 16, 1956.  Patient is currently working with Medicare to correct the error.  Advised patient that his insurance may not cover visits until the correction has been made and to contact our practice as soon as the issue is resolved.

## 2020-03-29 NOTE — Progress Notes (Signed)
[place], [place] [address]   CLINIC:  Medical Oncology/Hematology  PCP:  Loman Brooklyn, [place] / [place] [address]  [phone]  REASON FOR VISIT:  Follow-up for high-grade DLBCL  PRIOR THERAPY: R-EPOCH x 6 cycles from 05/11/2019 to 08/23/2019  CURRENT THERAPY: Observation  INTERVAL HISTORY:  [name], a 64 y.o. male, returns for routine follow-up for his high-grade DLBCL. Kevin [name] was last seen on 02/17/2020.  Today he reports feeling well and denies any more issues with his sinuses. He reports that he has not been taking Eliquis since his Medicare insurance got messed up and he is unable to pay out of pocket. He is not requiring any pain meds at the moment. His appetite is at 100% and his energy levels are at 90%.   REVIEW OF SYSTEMS:  Review of Systems  Constitutional: Positive for fatigue (90%). Negative for appetite change.  All other systems reviewed and are negative.   PAST MEDICAL/SURGICAL HISTORY:  Past Medical History:  Diagnosis Date  . Anxiety   . Depression   . 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 Hyperlipidemia 02/01/2014  . Hypertension   . Leg DVT (deep venous thromboembolism), acute, bilateral ([place]) 04/18/2019  . Port-A-Cath in place 04/27/2019   Past Surgical History:  Procedure Laterality Date  . CYSTOSCOPY W/ URETERAL STENT PLACEMENT Bilateral 04/21/2019   Procedure: CYSTOSCOPY WITH RETROGRADE PYELOGRAM/URETERAL STENT PLACEMENT;  Surgeon: Cleon Gustin, MD;  Location: [facility];  Service: Urology;  Laterality: Bilateral;  . ETHMOIDECTOMY Right 02/04/2020   Procedure: ETHMOIDECTOMY;  Surgeon: Leta Baptist, MD;  Location: [place];  Service: ENT;  Laterality: Right;  . INGUINAL LYMPH NODE BIOPSY Right 04/21/2019   Procedure: INGUINAL LYMPH NODE BIOPSY;  Surgeon: Virl Cagey, MD;  Location: [facility];  Service: General;  Laterality: Right;  . MAXILLARY  ANTROSTOMY Right 02/04/2020   Procedure: MAXILLARY ANTROSTOMY WITH TISSUE REMOVAL;  Surgeon: Leta Baptist, MD;  Location: [place];  Service: ENT;  Laterality: Right;  . PORTACATH PLACEMENT Left 05/07/2019   Procedure: INSERTION PORT-A-CATH (catheter left subclavian);  Surgeon: Virl Cagey, MD;  Location: [facility];  Service: General;  Laterality: Left;    SOCIAL HISTORY:  Social History   Socioeconomic History  . Marital status: Married    Spouse name: Not on file  . Number of children: Not on file  . Years of education: Not on file  . Highest education level: Not on file  Occupational History  . Not on file  Tobacco Use  . Smoking status: Former Smoker    Quit date: 04/27/1999    Years since quitting: 20.9  . Smokeless tobacco: Never Used  Vaping Use  . Vaping Use: Never used  Substance and Sexual Activity  . Alcohol use: Yes    Alcohol/week: 56.0 standard drinks    Types: 56 Cans of beer per week    Comment: social  . Drug use: Never  . Sexual activity: Not on file  Other Topics Concern  . Not on file  Social History Narrative  . Not on file   Social Determinants of Health   Financial Resource Strain:   . Difficulty of Paying Living Expenses: Not on file  Food Insecurity:   . Worried About Charity fundraiser in the Last Year: Not on file  . Ran Out of Food in the Last Year: Not on file  Transportation Needs:   . Film/video editor (Medical): Not on file  . Lack of Transportation (Non-Medical): Not on file  Physical Activity:   . Days of Exercise per Week: Not on file  . Minutes of Exercise per Session: Not on file  Stress:   . Feeling of Stress : Not on file  Social Connections:   . Frequency of Communication with Friends and Family: Not on file  . Frequency of Social Gatherings with Friends and Family: Not on file  . Attends Religious Services: Not on file  . Active Member of Clubs or Organizations: Not on file  . Attends Theatre manager Meetings: Not on file  . Marital Status: Not on file  Intimate Partner Violence:   . Fear of Current or Ex-Partner: Not on file  . Emotionally Abused: Not on file  . Physically Abused: Not on file  . Sexually Abused: Not on file    FAMILY HISTORY:  Family History  Problem Relation Age of Onset  . Diabetes Brother   . Diabetes Mother   . Leukemia Brother   . Diabetes Son     CURRENT MEDICATIONS:  Current Outpatient Medications  Medication Sig Dispense Refill  . amLODipine (NORVASC) 10 MG tablet TAKE 1 TABLET BY MOUTH EVERY DAY 90 tablet 0  . escitalopram (LEXAPRO) 10 MG tablet TAKE 1 TABLET BY MOUTH EVERY DAY 90 tablet 0  . furosemide (LASIX) 20 MG tablet TAKE 1 TABLET BY MOUTH EVERY DAY AS NEEDED 90 tablet 3  . LORazepam (ATIVAN) 0.5 MG tablet TAKE 1 TABLET (0.5 MG TOTAL) BY MOUTH EVERY 8 (EIGHT) HOURS. 60 tablet 3  . losartan (COZAAR) 100 MG tablet TAKE 1 TABLET BY MOUTH EVERY DAY 90 tablet 0  . metoprolol succinate (TOPROL-XL) 50 MG 24 hr tablet TAKE 1 TABLET BY MOUTH EVERY DAY 90 tablet 0   No current facility-administered medications for this visit.   Facility-Administered Medications Ordered in Other Visits  Medication Dose Route Frequency Provider Last Rate Last Admin  . 0.9 %  sodium chloride infusion   Intravenous Continuous Derek Jack, MD   Stopped at 06/04/19 1304  . 0.9 %  sodium chloride infusion   Intravenous Continuous Derek Jack, MD   Stopped at 08/04/19 1225  . 0.9 %  sodium chloride infusion   Intravenous Continuous Derek Jack, MD 50 mL/hr at 08/05/19 1134 New Bag at 08/05/19 1134  . 0.9 %  sodium chloride infusion   Intravenous Continuous Derek Jack, MD 20 mL/hr at 08/26/19 1105 New Bag at 08/26/19 1105  . sodium chloride flush (NS) 0.9 % injection 10 mL  10 mL Intravenous PRN Derek Jack, MD   10 mL at 08/04/19 1155  . sodium chloride flush (NS) 0.9 % injection 10 mL  10 mL Intravenous PRN  Derek Jack, MD   10 mL at 08/26/19 1104    ALLERGIES:  No Known Allergies  PHYSICAL EXAM:  Performance status (ECOG): 1 - Symptomatic but completely ambulatory  Vitals:   03/29/20 1601  BP: (!) 147/52  Pulse: (!) 54  Resp: 18  Temp: (!) 97.5 F (36.4 C)  SpO2: 98%   Wt Readings from Last 3 Encounters:  03/29/20 203 lb 4.8 oz (92.2 kg)  03/09/20 201 lb 9.6 oz (91.4 kg)  02/17/20 196 lb 6.9 oz (89.1 kg)   Physical Exam Vitals reviewed.  Constitutional:      Appearance: Normal appearance. He is obese.  Cardiovascular:     Rate and Rhythm: Normal  rate and regular rhythm.     Pulses: Normal pulses.     Heart sounds: Normal heart sounds.  Pulmonary:     Effort: Pulmonary effort is normal.     Breath sounds: Normal breath sounds.  Abdominal:     Palpations: Abdomen is soft.     Tenderness: There is no abdominal tenderness.  Musculoskeletal:     Right lower leg: No edema.     Left lower leg: No edema.  Neurological:     General: No focal deficit present.     Mental Status: He is alert and oriented to person, place, and time.  Psychiatric:        Mood and Affect: Mood normal.        Behavior: Behavior normal.     LABORATORY DATA:  I have reviewed the labs as listed.  CBC Latest Ref Rng & Units 03/23/2020 12/29/2019 11/24/2019  WBC 4.0 - 10.5 K/uL 5.8 4.5 5.3  Hemoglobin 13.0 - 17.0 g/dL 13.4 12.5(L) 11.0(L)  Hematocrit 39 - 52 % 41.3 39.2 35.7(L)  Platelets 150 - 400 K/uL 197 183 200   CMP Latest Ref Rng & Units 03/23/2020 02/01/2020 12/29/2019  Glucose 70 - 99 mg/dL 114(H) 117(H) 117(H)  BUN 8 - 23 mg/dL 16 17 17   Creatinine 0.61 - 1.24 mg/dL 0.74 0.77 0.83  Sodium 135 - 145 mmol/L 140 136 140  Potassium 3.5 - 5.1 mmol/L 4.1 5.1 4.4  Chloride 98 - 111 mmol/L 106 100 104  CO2 22 - 32 mmol/L 28 28 29   Calcium 8.9 - 10.3 mg/dL 9.0 9.2 9.4  Total Protein 6.5 - 8.1 g/dL 6.3(L) - 6.2(L)  Total Bilirubin 0.3 - 1.2 mg/dL 0.5 - 0.6  Alkaline Phos 38 - 126 U/L 83  - 90  AST 15 - 41 U/L 17 - 19  ALT 0 - 44 U/L 15 - 13      Component Value Date/Time   RBC 4.38 03/23/2020 0812   MCV 94.3 03/23/2020 0812   MCH 30.6 03/23/2020 0812   MCHC 32.4 03/23/2020 0812   RDW 14.0 03/23/2020 0812   LYMPHSABS 1.1 03/23/2020 0812   MONOABS 0.6 03/23/2020 0812   EOSABS 0.3 03/23/2020 0812   BASOSABS 0.0 03/23/2020 0812   Lab Results  Component Value Date   LDH 150 03/23/2020   LDH 126 11/24/2019   LDH 124 10/25/2019    DIAGNOSTIC IMAGING:  I have independently reviewed the scans and discussed with the patient. NM PET Image Initial (PI) Skull Base To Thigh  Result Date: 03/28/2020 CLINICAL DATA:  Subsequent treatment strategy for diffuse large B-cell lymphoma. EXAM: NUCLEAR MEDICINE PET SKULL BASE TO THIGH TECHNIQUE: 11.8 mCi F-18 FDG was injected intravenously. Full-ring PET imaging was performed from the skull base to thigh after the radiotracer. CT data was obtained and used for attenuation correction and anatomic localization. Fasting blood glucose: 101 mg/dl COMPARISON:  12/14/2019 FINDINGS: Mediastinal blood-pool activity (background): SUV max = 2.6 Liver activity (reference): SUV max = 3.9 NECK:  No hypermetabolic lymph nodes or masses. Incidental CT findings: There has been surgical resection of previously seen polypoid mass in the right maxillary sinus since previous study. CHEST: No hypermetabolic masses or lymphadenopathy. No suspicious pulmonary nodules seen on CT images. Incidental CT findings:  None. ABDOMEN/PELVIS: No abnormal hypermetabolic activity within the liver, pancreas, adrenal glands, or spleen. Confluency soft tissue density in the central small bowel mesentery measures 11.6 x 4.2 cm on image 191/3, shows no significant change in size  since previous study. SUV max measures 3.0 on today's exam, compared to 3.6 previously. Small approximately 1 cm left paraaortic and right external iliac lymph nodes remain stable in size. No FDG uptake is seen  within the left paraaortic lymph node. SUV max in 10 mm right external iliac lymph node measures 1.8 currently, compared to 2.3 previously. No new or increased hypermetabolic lymphadenopathy identified. Incidental CT findings: Stable small benign low-attenuation left adrenal adenoma. SKELETON: No focal hypermetabolic bone lesions to suggest skeletal metastasis. Incidental CT findings:  None. IMPRESSION: No significant change in mildly hypermetabolic mesenteric soft tissue mass and small right external iliac lymph node since prior study (Deauville score 3). No new or progressive disease identified. Electronically Signed   By: Marlaine Hind M.D.   On: 03/28/2020 13:16     ASSESSMENT:  1. 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 -6 cycles of R-EPOCH from 05/11/2019 through 08/23/2019. -PET scan on 09/17/2019 showed central abdominal mass with SUV 2.9, measuring 10 x 3.7 cm. Mildly enlarged right pelvic sidewall lymph node 12 mm SUV 1.7. Areas of multifocal bone involvement with near complete resolution. Deauville 2/3 in the five-point scale. -He was evaluated by lymphoma specialist Dr. Cassell Clement at the request [facility]. His case was discussed at tumor board. No adjuvant radiation therapy or maintenance rituximab or transplant was recommended at this time. -PET scan on 12/14/2019 shows soft tissue and nodularity at the root and within the small bowel mesentery, grossly similar measuring 4.2 x 11.2 cm with SUV max 3.6, similar to 3.2 previously. External iliac and inguinal lymph nodes do not show metabolic some low blood pool. SUV 2.3. -Enlarging rounded soft tissue in the right maxillary sinus with periosteal thickening and mild hypermetabolic zone. -PET scan on 03/27/2020 shows confluent soft tissue density in the  central small bowel mesentery 11.6 x 4.2 cm with no significant change in size.  SUV 3.0, previously 3.6.  Small left para-aortic lymph node 1 cm with no FDG uptake.  Right external iliac lymph node, 10 mm with SUV 1.8, previously 2.3.  No new or progressive areas.    2. Bilateral leg DVT: -Ultrasound Doppler on 04/18/2019 showed bilateral leg DVT. -He is on Eliquis since then. -Repeat Doppler on 12/29/2019 shows previous extensive right lower extremity DVT has resolved with minor residual nonocclusive right peroneal thrombus noted. Very minimal residual thrombus burden. Negative left leg DVT.  3.  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   PLAN:  1. Double hit lymphoma: -Reviewed PET scan results from 03/27/2020.  No evidence of recurrence. -Labs reviewed from 03/23/2020 which showed normal LDH and LFTs. -RTC 4 months with repeat scan and labs.  2. Bilateral leg DVT: -He is requesting whether he can come off of Eliquis as it is expensive. -I have recommended repeating Doppler.  If it is negative for DVT, he can come off of Eliquis.  3. Anxiety: -Continue Ativan as needed.  4. Lower extremity swelling: -He is not requiring Lasix lately.  5. Hypertension: -Continue losartan, Toprol-XL and Norvasc.  6.  Aspergilloma of the right maxillary sinus: -He had surgical removal.  No evidence of recurrence.  Orders placed this encounter:  No orders of the defined types were placed in this encounter.    Derek Jack,  MD [place] [phone]   I, Milinda Antis, am acting as a scribe for Dr. Sanda Linger.  I, Derek Jack MD, have reviewed the above documentation for accuracy and completeness, and I agree with the above.

## 2020-04-01 NOTE — Progress Notes (Signed)
Referring Provider: Loman Brooklyn, FNP Primary Care Physician:  Loman Brooklyn, FNP Primary Gastroenterologist:  Dr. Gala Romney  Chief Complaint  Patient presents with  . Colonoscopy    never had a tcs before     HPI:   Kevin [name] is a 64 y.o. male presenting today at the request of Loman Brooklyn, FNP for colon cancer screening.   Patient has history significant for diffuse large B-cell lymphoma following with Dr. Delton Coombes.  Also had bilateral DVT in October 2020 and was started on Eliquis.  Per Dr. Tomie China last note, there are plans for repeat Doppler imaging and if negative, patient will come off Eliquis.  Ultrasound is scheduled 04/03/2020.  Today: No prior colonoscopy. No abdominal pain. BMs daily. No constipation or diarrhea. No blood in the stool. No black stool. No nausea, vomiting, GERD symptoms, or dysphagia.  Has [place] scheduled for today to follow-up on DVTs.   Alcohol- 2-6 beer daily.   Past Medical History:  Diagnosis Date  . Anxiety   . Depression   . Diffuse large B cell lymphoma ([place]) 04/26/2019  . Hyperlipidemia 02/01/2014  . Hypertension   . Leg DVT (deep venous thromboembolism), acute, bilateral ([place]) 04/18/2019  . Port-A-Cath in place 04/27/2019    Past Surgical History:  Procedure Laterality Date  . CYSTOSCOPY W/ URETERAL STENT PLACEMENT Bilateral 04/21/2019   Procedure: CYSTOSCOPY WITH RETROGRADE PYELOGRAM/URETERAL STENT PLACEMENT;  Surgeon: Cleon Gustin, MD;  Location: [facility];  Service: Urology;  Laterality: Bilateral;  . ETHMOIDECTOMY Right 02/04/2020   Procedure: ETHMOIDECTOMY;  Surgeon: Leta Baptist, MD;  Location: [facility];  Service: ENT;  Laterality: Right;  . INGUINAL LYMPH NODE BIOPSY Right 04/21/2019   Procedure: INGUINAL LYMPH NODE BIOPSY;  Surgeon: Virl Cagey, MD;  Location: [facility];  Service: General;  Laterality: Right;  . MAXILLARY ANTROSTOMY Right 02/04/2020   Procedure: MAXILLARY ANTROSTOMY WITH TISSUE  REMOVAL;  Surgeon: Leta Baptist, MD;  Location: [facility];  Service: ENT;  Laterality: Right;  . PORTACATH PLACEMENT Left 05/07/2019   Procedure: INSERTION PORT-A-CATH (catheter left subclavian);  Surgeon: Virl Cagey, MD;  Location: [facility];  Service: General;  Laterality: Left;    Current Outpatient Medications  Medication Sig Dispense Refill  . amLODipine (NORVASC) 10 MG tablet TAKE 1 TABLET BY MOUTH EVERY DAY 90 tablet 0  . apixaban (ELIQUIS) 2.5 MG TABS tablet Take 2.5 mg by mouth 2 (two) times daily.    Marland Kitchen escitalopram (LEXAPRO) 10 MG tablet TAKE 1 TABLET BY MOUTH EVERY DAY 90 tablet 0  . furosemide (LASIX) 20 MG tablet TAKE 1 TABLET BY MOUTH EVERY DAY AS NEEDED 90 tablet 3  . LORazepam (ATIVAN) 0.5 MG tablet TAKE 1 TABLET (0.5 MG TOTAL) BY MOUTH EVERY 8 (EIGHT) HOURS. 60 tablet 3  . losartan (COZAAR) 100 MG tablet TAKE 1 TABLET BY MOUTH EVERY DAY 90 tablet 0  . metoprolol succinate (TOPROL-XL) 50 MG 24 hr tablet TAKE 1 TABLET BY MOUTH EVERY DAY 90 tablet 0   No current facility-administered medications for this visit.   Facility-Administered Medications Ordered in Other Visits  Medication Dose Route Frequency Provider Last Rate Last Admin  . 0.9 %  sodium chloride infusion   Intravenous Continuous Derek Jack, MD   Stopped at 06/04/19 1304  . 0.9 %  sodium chloride infusion   Intravenous Continuous Derek Jack, MD   Stopped at 08/04/19 1225  . 0.9 %  sodium chloride infusion  Intravenous Continuous Derek Jack, MD 50 mL/hr at 08/05/19 1134 New Bag at 08/05/19 1134  . 0.9 %  sodium chloride infusion   Intravenous Continuous Derek Jack, MD 20 mL/hr at 08/26/19 1105 New Bag at 08/26/19 1105  . sodium chloride flush (NS) 0.9 % injection 10 mL  10 mL Intravenous PRN Derek Jack, MD   10 mL at 08/04/19 1155  . sodium chloride flush (NS) 0.9 % injection 10 mL  10 mL Intravenous PRN Derek Jack, MD   10 mL at 08/26/19 1104     Allergies as of 04/03/2020  . (No Known Allergies)    Family History  Problem Relation Age of Onset  . Diabetes Brother   . Diabetes Mother   . Leukemia Brother   . Diabetes Son   . Colon cancer Neg Hx     Social History   Socioeconomic History  . Marital status: Married    Spouse name: Not on file  . Number of children: Not on file  . Years of education: Not on file  . Highest education level: Not on file  Occupational History  . Not on file  Tobacco Use  . Smoking status: Former Smoker    Quit date: 04/27/1999    Years since quitting: 20.9  . Smokeless tobacco: Never Used  Vaping Use  . Vaping Use: Never used  Substance and Sexual Activity  . Alcohol use: Yes    Comment: daily. 2-6 beer daily  . Drug use: Never  . Sexual activity: Not on file  Other Topics Concern  . Not on file  Social History Narrative  . Not on file   Social Determinants of Health   Financial Resource Strain:   . Difficulty of Paying Living Expenses: Not on file  Food Insecurity:   . Worried About Charity fundraiser in the Last Year: Not on file  . Ran Out of Food in the Last Year: Not on file  Transportation Needs:   . Lack of Transportation (Medical): Not on file  . Lack of Transportation (Non-Medical): Not on file  Physical Activity:   . Days of Exercise per Week: Not on file  . Minutes of Exercise per Session: Not on file  Stress:   . Feeling of Stress : Not on file  Social Connections:   . Frequency of Communication with Friends and Family: Not on file  . Frequency of Social Gatherings with Friends and Family: Not on file  . Attends Religious Services: Not on file  . Active Member of Clubs or Organizations: Not on file  . Attends Archivist Meetings: Not on file  . Marital Status: Not on file  Intimate Partner Violence:   . Fear of Current or Ex-Partner: Not on file  . Emotionally Abused: Not on file  . Physically Abused: Not on file  . Sexually Abused: Not  on file    Review of Systems: Gen: Denies any fever, chills, cold or flulike symptoms, lightheadedness, dizziness, presyncope, syncope. CV: Denies chest pain or heart palpitations. Resp: Denies shortness of breath or cough. GI: See HPI GU : Denies urinary burning, urinary frequency, urinary hesitancy MS: Denies joint pain.  Derm: Denies rash Psych: Denies depression or anxiety Heme: See HPI  Physical Exam: BP (!) 173/67   Pulse (!) 58   Temp 97.7 F (36.5 C) (Temporal)   Ht 5\' 6"  (1.676 m)   Wt 204 lb (92.5 kg)   BMI 32.93 kg/m  General:   Alert  and oriented. Pleasant and cooperative. Well-nourished and well-developed.  Head:  Normocephalic and atraumatic. Eyes:  Without icterus, sclera clear and conjunctiva pink.  Ears:  Normal auditory acuity. Lungs:  Clear to auscultation bilaterally. No wheezes, rales, or rhonchi. No distress.  Heart:  S1, S2 present without murmurs appreciated.  Abdomen:  +BS, soft, non-tender and non-distended. No HSM noted. No guarding or rebound. No masses appreciated.  Rectal:  Deferred  Msk:  Symmetrical without gross deformities. Normal posture. Extremities:  Without edema. Neurologic:  Alert and  oriented x4;  grossly normal neurologically. Skin:  Intact without significant lesions or rashes. Psych: Normal mood and affect.

## 2020-04-03 ENCOUNTER — Ambulatory Visit (HOSPITAL_COMMUNITY)
Admission: RE | Admit: 2020-04-03 | Discharge: 2020-04-03 | Disposition: A | Discharge status: Home / Self Care | Payer: Medicare Other | Source: Ambulatory Visit | Attending: Hematology | Admitting: Hematology

## 2020-04-03 ENCOUNTER — Encounter: Payer: Self-pay | Admitting: Gastroenterology

## 2020-04-03 ENCOUNTER — Other Ambulatory Visit: Payer: Self-pay

## 2020-04-03 ENCOUNTER — Ambulatory Visit (INDEPENDENT_AMBULATORY_CARE_PROVIDER_SITE_OTHER): Payer: Self-pay | Admitting: Gastroenterology

## 2020-04-03 DIAGNOSIS — J338 Other polyp of sinus: Secondary | ICD-10-CM | POA: Diagnosis not present

## 2020-04-03 DIAGNOSIS — C8338 Diffuse large B-cell lymphoma, lymph nodes of multiple sites: Secondary | ICD-10-CM | POA: Insufficient documentation

## 2020-04-03 DIAGNOSIS — J322 Chronic ethmoidal sinusitis: Secondary | ICD-10-CM | POA: Diagnosis not present

## 2020-04-03 DIAGNOSIS — Z1211 Encounter for screening for malignant neoplasm of colon: Secondary | ICD-10-CM

## 2020-04-03 DIAGNOSIS — I82401 Acute embolism and thrombosis of unspecified deep veins of right lower extremity: Secondary | ICD-10-CM | POA: Diagnosis not present

## 2020-04-03 DIAGNOSIS — J320 Chronic maxillary sinusitis: Secondary | ICD-10-CM | POA: Diagnosis not present

## 2020-04-03 NOTE — Assessment & Plan Note (Addendum)
64 year old male with history of bilateral DVT in October 2020 on Eliquis, diffuse large B-cell lymphoma following with oncology, HTN, HLD, anxiety, depression, and daily alcohol use presenting today to schedule first ever screening colonoscopy.  No significant upper or lower GI symptoms.  No alarm symptoms.  No family history of colon cancer.  Notably, patient is scheduled for venous ultrasound for follow-up of DVT later today to determine whether or not he needs to continue Eliquis.  Plan: Proceed with colonoscopy with propofol with Dr. Gala Romney in the near future. The risks, benefits, and alternatives have been discussed with the patient in detail. The patient states understanding and desires to proceed.  ASA III We will hold off on scheduling until ultrasound results are reviewed to determine whether or not patient will continue Eliquis.  If Eliquis is continued, we will need to touch base with Dr. Delton Coombes to get the okay to hold this 48 hours prior to his procedure. Follow-up as Dr. Gala Romney recommends at the time of colonoscopy.

## 2020-04-03 NOTE — Patient Instructions (Signed)
We will get you scheduled for a colonoscopy in the near future with Dr. Gala Romney. If you are still on Eliquis at the time of your colonoscopy, we will need to discuss this with Dr. Delton Coombes as we typically hold this medication 48 hours prior to your procedure.  I will follow up on your ultrasound you are having later today for history of DVT.  Further recommendations to follow regarding Eliquis.  We will follow up with you as Dr. Gala Romney recommends.  Do not hesitate to call if you have any new GI concerns.  It was great meeting you today!  Aliene Altes, PA-C [facility]

## 2020-04-06 ENCOUNTER — Inpatient Hospital Stay (HOSPITAL_COMMUNITY): Payer: Medicare Other | Attending: Hematology | Admitting: Hematology

## 2020-04-06 ENCOUNTER — Other Ambulatory Visit: Payer: Self-pay

## 2020-04-06 NOTE — Progress Notes (Signed)
Called patient twice for phone visit [id number] and [phone] and has not returned call yet.

## 2020-04-09 ENCOUNTER — Other Ambulatory Visit: Payer: Self-pay | Admitting: Family Medicine

## 2020-04-09 DIAGNOSIS — I10 Essential (primary) hypertension: Secondary | ICD-10-CM

## 2020-05-18 ENCOUNTER — Other Ambulatory Visit: Payer: Self-pay | Admitting: Family Medicine

## 2020-05-18 DIAGNOSIS — F419 Anxiety disorder, unspecified: Secondary | ICD-10-CM

## 2020-06-08 ENCOUNTER — Ambulatory Visit (INDEPENDENT_AMBULATORY_CARE_PROVIDER_SITE_OTHER): Payer: Medicare Other | Admitting: Family Medicine

## 2020-06-08 ENCOUNTER — Other Ambulatory Visit: Payer: Self-pay

## 2020-06-08 ENCOUNTER — Encounter: Payer: Self-pay | Admitting: Family Medicine

## 2020-06-08 VITALS — BP 176/65 | HR 61 | Temp 98.3°F | Ht 66.0 in | Wt 213.5 lb

## 2020-06-08 DIAGNOSIS — I10 Essential (primary) hypertension: Secondary | ICD-10-CM | POA: Diagnosis not present

## 2020-06-08 DIAGNOSIS — Z0000 Encounter for general adult medical examination without abnormal findings: Secondary | ICD-10-CM

## 2020-06-08 DIAGNOSIS — Z23 Encounter for immunization: Secondary | ICD-10-CM | POA: Diagnosis not present

## 2020-06-08 DIAGNOSIS — E785 Hyperlipidemia, unspecified: Secondary | ICD-10-CM | POA: Diagnosis not present

## 2020-06-08 DIAGNOSIS — F419 Anxiety disorder, unspecified: Secondary | ICD-10-CM

## 2020-06-08 NOTE — Progress Notes (Signed)
Patient ID: Kevin [name], male    DOB: 07/22/1955, 64 y.o.   MRN: [id number]  Chief Complaint:  Medical Management of Chronic Issues   HPI: Kevin [name] is a 64 y.o. male presenting on 06/08/2020 for Medical Management of Chronic Issues   1. Essential hypertension   2. Hyperlipidemia, unspecified hyperlipidemia type   3. Anxiety    1. HTN Complaint with meds - Yes, amlodipine 10 mg, losartan 100 mg, and metoprolol 50 mg Checking BP at home: will start checking regularly, reports that it is always higher in the office but ok at home  Exercising Regularly - No Watching Salt intake - Yes Pertinent ROS:  Headache -  Chest pain - No Dyspnea - No Palpitations - No LE edema - No  2. Anxiety Taking Lexapro daily. He has not taken ativan recently.   GAD 7 : Generalized Anxiety Score 06/08/2020  Nervous, Anxious, on Edge 0  Control/stop worrying 0  Worry too much - different things 0  Trouble relaxing 0  Restless 0  Easily annoyed or irritable 1  Afraid - awful might happen 0  Total GAD 7 Score 1    They report good compliance with medications and can restate their regimen by memory. No medication side effects  PMH: Smoking status noted  Review of Systems    Objective   BP (!) 159/61   Pulse 61   Temp 98.3 F (36.8 C) (Temporal)   Ht $R'5\' 6"'Tj$  (1.676 m)   Wt 213 lb 8 oz (96.8 kg)   BMI 34.46 kg/m   Physical Exam Vitals and nursing note reviewed.  Constitutional:      General: He is not in acute distress.    Appearance: Normal appearance. He is not ill-appearing, toxic-appearing or diaphoretic.  Eyes:     Extraocular Movements: Extraocular movements intact.     Conjunctiva/sclera: Conjunctivae normal.     Pupils: Pupils are equal, round, and reactive to light.  Neck:     Vascular: No carotid bruit.  Cardiovascular:     Rate and Rhythm: Normal rate and regular rhythm.     Heart sounds: Normal heart sounds. No murmur heard.   Pulmonary:     Effort:  Pulmonary effort is normal. No respiratory distress.     Breath sounds: Normal breath sounds.  Abdominal:     General: Bowel sounds are normal. There is no distension.     Palpations: Abdomen is soft.     Tenderness: There is no abdominal tenderness. There is no guarding.  Musculoskeletal:     Cervical back: Neck supple. No rigidity.     Right lower leg: No edema.     Left lower leg: No edema.  Skin:    General: Skin is warm and dry.  Neurological:     General: No focal deficit present.     Mental Status: He is alert and oriented to person, place, and time.  Psychiatric:        Mood and Affect: Mood normal.        Behavior: Behavior normal.       Assessment and Plan: [name] was seen today for medical management of chronic issues.  Diagnoses and all orders for this visit:  Essential hypertension Elevated today and was elevated at last visit. Instructed to check BP at home and bring log to next visit. He agree to call if BP at home is >130/80. Labs pending, he has only had diet mt dew in the last  4 hours.  -     CMP14+EGFR -     Lipid panel -     TSH  Hyperlipidemia, unspecified hyperlipidemia type -     Lipid panel  Anxiety Well controlled on current regimen.   Need for immunization against influenza -     Flu Vaccine QUAD High Dose(Fluad)  Healthcare maintenance Declines Tdap vaccine today. He reports he has a colonoscopy scheduled for early 2022.  Educational handout given for Hypertension, DASH diet  Follow up in 3 months with PCP for chronic follow up, sooner if needed.  The above assessment and management plan was discussed with the patient. The patient verbalized understanding of and has agreed to the management plan. Patient is aware to call the clinic if symptoms persist or worsen. Patient is aware when to return to the clinic for a follow-up visit. Patient educated on when it is appropriate to go to the emergency department.   Marjorie Smolder, FNP-C [facility] [phone]

## 2020-06-08 NOTE — Patient Instructions (Signed)
DASH Eating Plan °DASH stands for "Dietary Approaches to Stop Hypertension." The DASH eating plan is a healthy eating plan that has been shown to reduce high blood pressure (hypertension). It may also reduce your risk for type 2 diabetes, heart disease, and stroke. The DASH eating plan may also help with weight loss. °What are tips for following this plan? ° °General guidelines °· Avoid eating more than 2,300 mg (milligrams) of salt (sodium) a day. If you have hypertension, you may need to reduce your sodium intake to 1,500 mg a day. °· Limit alcohol intake to no more than 1 drink a day for nonpregnant women and 2 drinks a day for men. One drink equals 12 oz of beer, 5 oz of wine, or 1½ oz of hard liquor. °· Work with your health care provider to maintain a healthy body weight or to lose weight. Ask what an ideal weight is for you. °· Get at least 30 minutes of exercise that causes your heart to beat faster (aerobic exercise) most days of the week. Activities may include walking, swimming, or biking. °· Work with your health care provider or diet and nutrition specialist (dietitian) to adjust your eating plan to your individual calorie needs. °Reading food labels ° °· Check food labels for the amount of sodium per serving. Choose foods with less than 5 percent of the Daily Value of sodium. Generally, foods with less than 300 mg of sodium per serving fit into this eating plan. °· To find whole grains, look for the word "whole" as the first word in the ingredient list. °Shopping °· Buy products labeled as "low-sodium" or "no salt added." °· Buy fresh foods. Avoid canned foods and premade or frozen meals. °Cooking °· Avoid adding salt when cooking. Use salt-free seasonings or herbs instead of table salt or sea salt. Check with your health care provider or pharmacist before using salt substitutes. °· Do not fry foods. Cook foods using healthy methods such as baking, boiling, grilling, and broiling instead. °· Cook with  heart-healthy oils, such as olive, canola, soybean, or sunflower oil. °Meal planning °· Eat a balanced diet that includes: °? 5 or more servings of fruits and vegetables each day. At each meal, try to fill half of your plate with fruits and vegetables. °? Up to 6-8 servings of whole grains each day. °? Less than 6 oz of lean meat, poultry, or fish each day. A 3-oz serving of meat is about the same size as a deck of cards. One egg equals 1 oz. °? 2 servings of low-fat dairy each day. °? A serving of nuts, seeds, or beans 5 times each week. °? Heart-healthy fats. Healthy fats called Omega-3 fatty acids are found in foods such as flaxseeds and coldwater fish, like sardines, salmon, and mackerel. °· Limit how much you eat of the following: °? Canned or prepackaged foods. °? Food that is high in trans fat, such as fried foods. °? Food that is high in saturated fat, such as fatty meat. °? Sweets, desserts, sugary drinks, and other foods with added sugar. °? Full-fat dairy products. °· Do not salt foods before eating. °· Try to eat at least 2 vegetarian meals each week. °· Eat more home-cooked food and less restaurant, buffet, and fast food. °· When eating at a restaurant, ask that your food be prepared with less salt or no salt, if possible. °What foods are recommended? °The items listed may not be a complete list. Talk with your dietitian about   what dietary choices are best for you. °Grains °Whole-grain or whole-wheat bread. Whole-grain or whole-wheat pasta. Brown rice. Oatmeal. Quinoa. Bulgur. Whole-grain and low-sodium cereals. Pita bread. Low-fat, low-sodium crackers. Whole-wheat flour tortillas. °Vegetables °Fresh or frozen vegetables (raw, steamed, roasted, or grilled). Low-sodium or reduced-sodium tomato and vegetable juice. Low-sodium or reduced-sodium tomato sauce and tomato paste. Low-sodium or reduced-sodium canned vegetables. °Fruits °All fresh, dried, or frozen fruit. Canned fruit in natural juice (without  added sugar). °Meat and other protein foods °Skinless chicken or turkey. Ground chicken or turkey. Pork with fat trimmed off. Fish and seafood. Egg whites. Dried beans, peas, or lentils. Unsalted nuts, nut butters, and seeds. Unsalted canned beans. Lean cuts of beef with fat trimmed off. Low-sodium, lean deli meat. °Dairy °Low-fat (1%) or fat-free (skim) milk. Fat-free, low-fat, or reduced-fat cheeses. Nonfat, low-sodium ricotta or cottage cheese. Low-fat or nonfat yogurt. Low-fat, low-sodium cheese. °Fats and oils °Soft margarine without trans fats. Vegetable oil. Low-fat, reduced-fat, or light mayonnaise and salad dressings (reduced-sodium). Canola, safflower, olive, soybean, and sunflower oils. Avocado. °Seasoning and other foods °Herbs. Spices. Seasoning mixes without salt. Unsalted popcorn and pretzels. Fat-free sweets. °What foods are not recommended? °The items listed may not be a complete list. Talk with your dietitian about what dietary choices are best for you. °Grains °Baked goods made with fat, such as croissants, muffins, or some breads. Dry pasta or rice meal packs. °Vegetables °Creamed or fried vegetables. Vegetables in a cheese sauce. Regular canned vegetables (not low-sodium or reduced-sodium). Regular canned tomato sauce and paste (not low-sodium or reduced-sodium). Regular tomato and vegetable juice (not low-sodium or reduced-sodium). Pickles. Olives. °Fruits °Canned fruit in a light or heavy syrup. Fried fruit. Fruit in cream or butter sauce. °Meat and other protein foods °Fatty cuts of meat. Ribs. Fried meat. Bacon. Sausage. Bologna and other processed lunch meats. Salami. Fatback. Hotdogs. Bratwurst. Salted nuts and seeds. Canned beans with added salt. Canned or smoked fish. Whole eggs or egg yolks. Chicken or turkey with skin. °Dairy °Whole or 2% milk, cream, and half-and-half. Whole or full-fat cream cheese. Whole-fat or sweetened yogurt. Full-fat cheese. Nondairy creamers. Whipped toppings.  Processed cheese and cheese spreads. °Fats and oils °Butter. Stick margarine. Lard. Shortening. Ghee. Bacon fat. Tropical oils, such as coconut, palm kernel, or palm oil. °Seasoning and other foods °Salted popcorn and pretzels. Onion salt, garlic salt, seasoned salt, table salt, and sea salt. Worcestershire sauce. Tartar sauce. Barbecue sauce. Teriyaki sauce. Soy sauce, including reduced-sodium. Steak sauce. Canned and packaged gravies. Fish sauce. Oyster sauce. Cocktail sauce. Horseradish that you find on the shelf. Ketchup. Mustard. Meat flavorings and tenderizers. Bouillon cubes. Hot sauce and Tabasco sauce. Premade or packaged marinades. Premade or packaged taco seasonings. Relishes. Regular salad dressings. °Where to find more information: °· National Heart, [employer], [employer] Institute: www.nhlbi.nih.gov °· American Heart Association: www.heart.org °Summary °· The DASH eating plan is a healthy eating plan that has been shown to reduce high blood pressure (hypertension). It may also reduce your risk for type 2 diabetes, heart disease, and stroke. °· With the DASH eating plan, you should limit salt (sodium) intake to 2,300 mg a day. If you have hypertension, you may need to reduce your sodium intake to 1,500 mg a day. °· When on the DASH eating plan, aim to eat more fresh fruits and vegetables, whole grains, lean proteins, low-fat dairy, and heart-healthy fats. °· Work with your health care provider or diet and nutrition specialist (dietitian) to adjust your eating plan to your   individual calorie needs. °This information is not intended to replace advice given to you by your health care provider. Make sure you discuss any questions you have with your health care provider. °Document Revised: 05/30/2017 Document Reviewed: 06/10/2016 °Elsevier Patient Education © 2020 [employer] °Hypertension, Adult °High blood pressure (hypertension) is when the force of blood pumping through the arteries is too strong. The  arteries are the blood vessels that carry blood from the heart throughout the body. Hypertension forces the heart to work harder to pump blood and may cause arteries to become narrow or stiff. Untreated or uncontrolled hypertension can cause a heart attack, heart failure, a stroke, kidney disease, and other problems. °A blood pressure reading consists of a higher number over a lower number. Ideally, your blood pressure should be below 120/80. The first ("top") number is called the systolic pressure. It is a measure of the pressure in your arteries as your heart beats. The second ("bottom") number is called the diastolic pressure. It is a measure of the pressure in your arteries as the heart relaxes. °What are the causes? °The exact cause of this condition is not known. There are some conditions that result in or are related to high blood pressure. °What increases the risk? °Some risk factors for high blood pressure are under your control. The following factors may make you more likely to develop this condition: °· Smoking. °· Having type 2 diabetes mellitus, high cholesterol, or both. °· Not getting enough exercise or physical activity. °· Being overweight. °· Having too much fat, sugar, calories, or salt (sodium) in your diet. °· Drinking too much alcohol. °Some risk factors for high blood pressure may be difficult or impossible to change. Some of these factors include: °· Having chronic kidney disease. °· Having a family history of high blood pressure. °· Age. Risk increases with age. °· Race. You may be at higher risk if you are African American. °· Gender. Men are at higher risk than women before age 45. After age 65, women are at higher risk than men. °· Having obstructive sleep apnea. °· Stress. °What are the signs or symptoms? °High blood pressure may not cause symptoms. Very high blood pressure (hypertensive crisis) may cause: °· Headache. °· Anxiety. °· Shortness of breath. °· Nosebleed. °· Nausea and  vomiting. °· Vision changes. °· Severe chest pain. °· Seizures. °How is this diagnosed? °This condition is diagnosed by measuring your blood pressure while you are seated, with your arm resting on a flat surface, your legs uncrossed, and your feet flat on the floor. The cuff of the blood pressure monitor will be placed directly against the skin of your upper arm at the level of your heart. It should be measured at least twice using the same arm. Certain conditions can cause a difference in blood pressure between your right and left arms. °Certain factors can cause blood pressure readings to be lower or higher than normal for a short period of time: °· When your blood pressure is higher when you are in a health care provider's office than when you are at home, this is called white coat hypertension. Most people with this condition do not need medicines. °· When your blood pressure is higher at home than when you are in a health care provider's office, this is called masked hypertension. Most people with this condition may need medicines to control blood pressure. °If you have a high blood pressure reading during one visit or you have normal blood pressure   with other risk factors, you may be asked to: °· Return on a different day to have your blood pressure checked again. °· Monitor your blood pressure at home for 1 week or longer. °If you are diagnosed with hypertension, you may have other blood or imaging tests to help your health care provider understand your overall risk for other conditions. °How is this treated? °This condition is treated by making healthy lifestyle changes, such as eating healthy foods, exercising more, and reducing your alcohol intake. Your health care provider may prescribe medicine if lifestyle changes are not enough to get your blood pressure under control, and if: °· Your systolic blood pressure is above 130. °· Your diastolic blood pressure is above 80. °Your personal target blood  pressure may vary depending on your medical conditions, your age, and other factors. °Follow these instructions at home: °Eating and drinking ° °· Eat a diet that is high in fiber and potassium, and low in sodium, added sugar, and fat. An example eating plan is called the DASH (Dietary Approaches to Stop Hypertension) diet. To eat this way: °? Eat plenty of fresh fruits and vegetables. Try to fill one half of your plate at each meal with fruits and vegetables. °? Eat whole grains, such as whole-wheat pasta, brown rice, or whole-grain bread. Fill about one fourth of your plate with whole grains. °? Eat or drink low-fat dairy products, such as skim milk or low-fat yogurt. °? Avoid fatty cuts of meat, processed or cured meats, and poultry with skin. Fill about one fourth of your plate with lean proteins, such as fish, chicken without skin, beans, eggs, or tofu. °? Avoid pre-made and processed foods. These tend to be higher in sodium, added sugar, and fat. °· Reduce your daily sodium intake. Most people with hypertension should eat less than 1,500 mg of sodium a day. °· Do not drink alcohol if: °? Your health care provider tells you not to drink. °? You are pregnant, may be pregnant, or are planning to become pregnant. °· If you drink alcohol: °? Limit how much you use to: °§ 0-1 drink a day for women. °§ 0-2 drinks a day for men. °? Be aware of how much alcohol is in your drink. In the [place] one drink equals one 12 oz bottle of beer (355 mL), one 5 oz glass of wine (148 mL), or one 1½ oz glass of hard liquor (44 mL). °Lifestyle ° °· Work with your health care provider to maintain a healthy body weight or to lose weight. Ask what an ideal weight is for you. °· Get at least 30 minutes of exercise most days of the week. Activities may include walking, swimming, or biking. °· Include exercise to strengthen your muscles (resistance exercise), such as Pilates or lifting weights, as part of your weekly exercise routine. Try  to do these types of exercises for 30 minutes at least 3 days a week. °· Do not use any products that contain nicotine or tobacco, such as cigarettes, e-cigarettes, and chewing tobacco. If you need help quitting, ask your health care provider. °· Monitor your blood pressure at home as told by your health care provider. °· Keep all follow-up visits as told by your health care provider. This is important. °Medicines °· Take over-the-counter and prescription medicines only as told by your health care provider. Follow directions carefully. Blood pressure medicines must be taken as prescribed. °· Do not skip doses of blood pressure medicine. Doing this puts you at   risk for problems and can make the medicine less effective. °· Ask your health care provider about side effects or reactions to medicines that you should watch for. °Contact a health care provider if you: °· Think you are having a reaction to a medicine you are taking. °· Have headaches that keep coming back (recurring). °· Feel dizzy. °· Have swelling in your ankles. °· Have trouble with your vision. °Get help right away if you: °· Develop a severe headache or confusion. °· Have unusual weakness or numbness. °· Feel faint. °· Have severe pain in your chest or abdomen. °· Vomit repeatedly. °· Have trouble breathing. °Summary °· Hypertension is when the force of blood pumping through your arteries is too strong. If this condition is not controlled, it may put you at risk for serious complications. °· Your personal target blood pressure may vary depending on your medical conditions, your age, and other factors. For most people, a normal blood pressure is less than 120/80. °· Hypertension is treated with lifestyle changes, medicines, or a combination of both. Lifestyle changes include losing weight, eating a healthy, low-sodium diet, exercising more, and limiting alcohol. °This information is not intended to replace advice given to you by your health care  provider. Make sure you discuss any questions you have with your health care provider. °Document Revised: 02/25/2018 Document Reviewed: 02/25/2018 °Elsevier Patient Education © 2020 [employer] ° °

## 2020-06-09 ENCOUNTER — Other Ambulatory Visit: Payer: Self-pay | Admitting: Family Medicine

## 2020-06-09 DIAGNOSIS — E781 Pure hyperglyceridemia: Secondary | ICD-10-CM

## 2020-06-09 LAB — CMP14+EGFR
ALT: 16 IU/L (ref 0–44)
AST: 16 IU/L (ref 0–40)
Albumin/Globulin Ratio: 2.4 — ABNORMAL HIGH (ref 1.2–2.2)
Albumin: 4.3 g/dL (ref 3.8–4.8)
Alkaline Phosphatase: 92 IU/L (ref 44–121)
BUN/Creatinine Ratio: 18 (ref 10–24)
BUN: 15 mg/dL (ref 8–27)
Bilirubin Total: 0.3 mg/dL (ref 0.0–1.2)
CO2: 29 mmol/L (ref 20–29)
Calcium: 9.3 mg/dL (ref 8.6–10.2)
Chloride: 102 mmol/L (ref 96–106)
Creatinine, Ser: 0.84 mg/dL (ref 0.76–1.27)
GFR calc Af Amer: 106 mL/min/{1.73_m2} (ref >59)
GFR calc non Af Amer: 92 mL/min/{1.73_m2} (ref >59)
Globulin, Total: 1.8 g/dL (ref 1.5–4.5)
Glucose: 92 mg/dL (ref 65–99)
Potassium: 5 mmol/L (ref 3.5–5.2)
Sodium: 142 mmol/L (ref 134–144)
Total Protein: 6.1 g/dL (ref 6.0–8.5)

## 2020-06-09 LAB — LIPID PANEL
Chol/HDL Ratio: 2.7 ratio (ref 0.0–5.0)
Cholesterol, Total: 172 mg/dL (ref 100–199)
HDL: 64 mg/dL (ref >39)
LDL Chol Calc (NIH): 72 mg/dL (ref 0–99)
Triglycerides: 219 mg/dL — ABNORMAL HIGH (ref 0–149)
VLDL Cholesterol Cal: 36 mg/dL (ref 5–40)

## 2020-06-09 LAB — TSH: TSH: 1.24 u[IU]/mL (ref 0.450–4.500)

## 2020-06-09 MED ORDER — OMEGA-3-ACID ETHYL ESTERS 1 G PO CAPS: 2.0000 g | ORAL_CAPSULE | Freq: Two times a day (BID) | ORAL | 3 refills | Status: DC

## 2020-07-17 ENCOUNTER — Other Ambulatory Visit: Payer: Self-pay | Admitting: Family Medicine

## 2020-07-17 DIAGNOSIS — I10 Essential (primary) hypertension: Secondary | ICD-10-CM

## 2020-07-31 ENCOUNTER — Other Ambulatory Visit: Payer: Self-pay

## 2020-07-31 ENCOUNTER — Ambulatory Visit (HOSPITAL_COMMUNITY)
Admission: RE | Admit: 2020-07-31 | Discharge: 2020-07-31 | Disposition: A | Discharge status: Home / Self Care | Payer: Medicare Other | Source: Ambulatory Visit | Attending: Hematology | Admitting: Hematology

## 2020-07-31 ENCOUNTER — Inpatient Hospital Stay (HOSPITAL_COMMUNITY): Payer: Medicare Other | Attending: Hematology

## 2020-07-31 DIAGNOSIS — C8338 Diffuse large B-cell lymphoma, lymph nodes of multiple sites: Secondary | ICD-10-CM | POA: Insufficient documentation

## 2020-07-31 DIAGNOSIS — C8330 Diffuse large B-cell lymphoma, unspecified site: Secondary | ICD-10-CM | POA: Diagnosis not present

## 2020-07-31 LAB — CBC WITH DIFFERENTIAL/PLATELET
Abs Immature Granulocytes: 0.06 K/uL (ref 0.00–0.07)
Basophils Absolute: 0.1 K/uL (ref 0.0–0.1)
Basophils Relative: 1 %
Eosinophils Absolute: 0.4 K/uL (ref 0.0–0.5)
Eosinophils Relative: 5 %
HCT: 41.5 % (ref 39.0–52.0)
Hemoglobin: 13.9 g/dL (ref 13.0–17.0)
Immature Granulocytes: 1 %
Lymphocytes Relative: 21 %
Lymphs Abs: 1.5 K/uL (ref 0.7–4.0)
MCH: 31.9 pg (ref 26.0–34.0)
MCHC: 33.5 g/dL (ref 30.0–36.0)
MCV: 95.2 fL (ref 80.0–100.0)
Monocytes Absolute: 0.7 K/uL (ref 0.1–1.0)
Monocytes Relative: 10 %
Neutro Abs: 4.5 K/uL (ref 1.7–7.7)
Neutrophils Relative %: 62 %
Platelets: 176 K/uL (ref 150–400)
RBC: 4.36 MIL/uL (ref 4.22–5.81)
RDW: 13.5 % (ref 11.5–15.5)
WBC: 7.3 K/uL (ref 4.0–10.5)
nRBC: 0 % (ref 0.0–0.2)

## 2020-07-31 LAB — COMPREHENSIVE METABOLIC PANEL WITH GFR
ALT: 19 U/L (ref 0–44)
AST: 18 U/L (ref 15–41)
Albumin: 4.1 g/dL (ref 3.5–5.0)
Alkaline Phosphatase: 68 U/L (ref 38–126)
Anion gap: 7 (ref 5–15)
BUN: 20 mg/dL (ref 8–23)
CO2: 27 mmol/L (ref 22–32)
Calcium: 9.1 mg/dL (ref 8.9–10.3)
Chloride: 102 mmol/L (ref 98–111)
Creatinine, Ser: 0.76 mg/dL (ref 0.61–1.24)
GFR, Estimated: >60 mL/min
Glucose, Bld: 106 mg/dL — ABNORMAL HIGH (ref 70–99)
Potassium: 4.5 mmol/L (ref 3.5–5.1)
Sodium: 136 mmol/L (ref 135–145)
Total Bilirubin: 0.6 mg/dL (ref 0.3–1.2)
Total Protein: 6.7 g/dL (ref 6.5–8.1)

## 2020-07-31 LAB — URIC ACID: Uric Acid, Serum: 5.3 mg/dL (ref 3.7–8.6)

## 2020-07-31 LAB — MAGNESIUM: Magnesium: 1.9 mg/dL (ref 1.7–2.4)

## 2020-07-31 LAB — PHOSPHORUS: Phosphorus: 2.7 mg/dL (ref 2.5–4.6)

## 2020-07-31 MED ORDER — FLUDEOXYGLUCOSE F - 18 (FDG) INJECTION
12.1100 | Freq: Once | INTRAVENOUS | Status: AC | PRN
  Administered 2020-07-31: 12.11 via INTRAVENOUS

## 2020-08-02 ENCOUNTER — Inpatient Hospital Stay (HOSPITAL_COMMUNITY): Payer: Medicare Other | Attending: Hematology | Admitting: Hematology

## 2020-08-10 ENCOUNTER — Other Ambulatory Visit: Payer: Self-pay | Admitting: *Deleted

## 2020-08-10 DIAGNOSIS — I10 Essential (primary) hypertension: Secondary | ICD-10-CM

## 2020-08-10 MED ORDER — LOSARTAN POTASSIUM 100 MG PO TABS: 100.0000 mg | ORAL_TABLET | Freq: Every day | ORAL | 0 refills | Status: DC

## 2020-08-21 ENCOUNTER — Other Ambulatory Visit: Payer: Self-pay | Admitting: Family Medicine

## 2020-08-21 DIAGNOSIS — F419 Anxiety disorder, unspecified: Secondary | ICD-10-CM

## 2020-09-06 ENCOUNTER — Inpatient Hospital Stay (HOSPITAL_COMMUNITY): Payer: Medicare Other | Attending: Hematology | Admitting: Hematology

## 2020-09-06 ENCOUNTER — Other Ambulatory Visit: Payer: Self-pay

## 2020-09-06 VITALS — BP 164/52 | HR 53 | Temp 98.6°F | Resp 17 | Wt 225.4 lb

## 2020-09-06 DIAGNOSIS — C8338 Diffuse large B-cell lymphoma, lymph nodes of multiple sites: Secondary | ICD-10-CM | POA: Diagnosis not present

## 2020-09-06 DIAGNOSIS — Z452 Encounter for adjustment and management of vascular access device: Secondary | ICD-10-CM | POA: Insufficient documentation

## 2020-09-06 NOTE — Progress Notes (Signed)
Kevin [name], Kevin [name]   CLINIC:  Medical Oncology/Hematology  PCP:  Loman Brooklyn, [place] / [place] [id number]  [phone]  REASON FOR VISIT:  Follow-up for high-grade DLBCL  PRIOR THERAPY: R-EPOCH x 6 cycles from 05/11/2019 to 08/23/2019  CURRENT THERAPY: Surveillance  INTERVAL HISTORY:  Kevin [name], a 65 y.o. male, returns for routine follow-up for his high-grade DLBCL. Kevin [name] was last seen on 03/29/2020.  Today he reports feeling well. He denies having any F/C, night sweats, weight loss, numbess or tingling. He is no longer on any blood thinner. He denies getting easily fatigued.  He is back to working and doing well.   REVIEW OF SYSTEMS:  Review of Systems  Constitutional: Positive for fatigue (75%). Negative for appetite change, chills, diaphoresis, fever and unexpected weight change.  Neurological: Negative for numbness.  All other systems reviewed and are negative.   PAST MEDICAL/SURGICAL HISTORY:  Past Medical History:  Diagnosis Date  . Anxiety   . Depression   . Diffuse large B cell lymphoma ([place]) 04/26/2019  . Hyperlipidemia 02/01/2014  . Hypertension   . Leg DVT (deep venous thromboembolism), acute, bilateral ([place]) 04/18/2019  . Port-A-Cath in place 04/27/2019   Past Surgical History:  Procedure Laterality Date  . CYSTOSCOPY W/ URETERAL STENT PLACEMENT Bilateral 04/21/2019   Procedure: CYSTOSCOPY WITH RETROGRADE PYELOGRAM/URETERAL STENT PLACEMENT;  Surgeon: Cleon Gustin, MD;  Location: [facility];  Service: Urology;  Laterality: Bilateral;  . ETHMOIDECTOMY Right 02/04/2020   Procedure: ETHMOIDECTOMY;  Surgeon: Leta Baptist, MD;  Location: [place];  Service: ENT;  Laterality: Right;  . INGUINAL LYMPH NODE BIOPSY Right 04/21/2019   Procedure: INGUINAL LYMPH NODE BIOPSY;  Surgeon: Virl Cagey, MD;  Location: [facility];  Service: General;  Laterality: Right;  .  MAXILLARY ANTROSTOMY Right 02/04/2020   Procedure: MAXILLARY ANTROSTOMY WITH TISSUE REMOVAL;  Surgeon: Leta Baptist, MD;  Location: [place];  Service: ENT;  Laterality: Right;  . PORTACATH PLACEMENT Left 05/07/2019   Procedure: INSERTION PORT-A-CATH (catheter left subclavian);  Surgeon: Virl Cagey, MD;  Location: [facility];  Service: General;  Laterality: Left;    SOCIAL HISTORY:  Social History   Socioeconomic History  . Marital status: Married    Spouse name: Not on file  . Number of children: Not on file  . Years of education: Not on file  . Highest education level: Not on file  Occupational History  . Not on file  Tobacco Use  . Smoking status: Former Smoker    Quit date: 04/27/1999    Years since quitting: 21.3  . Smokeless tobacco: Never Used  Vaping Use  . Vaping Use: Never used  Substance and Sexual Activity  . Alcohol use: Yes    Comment: daily. 2-6 beer daily  . Drug use: Never  . Sexual activity: Not on file  Other Topics Concern  . Not on file  Social History Narrative  . Not on file   Social Determinants of Health   Financial Resource Strain: Not on file  Food Insecurity: Not on file  Transportation Needs: Not on file  Physical Activity: Not on file  Stress: Not on file  Social Connections: Not on file  Intimate Partner Violence: Not on file    FAMILY HISTORY:  Family History  Problem Relation Age of Onset  . Diabetes Brother   . Diabetes Mother   . Leukemia Brother   .  Diabetes Son   . Colon cancer Neg Hx     CURRENT MEDICATIONS:  Current Outpatient Medications  Medication Sig Dispense Refill  . amLODipine (NORVASC) 10 MG tablet TAKE 1 TABLET BY MOUTH EVERY DAY 30 tablet 2  . apixaban (ELIQUIS) 2.5 MG TABS tablet Take 2.5 mg by mouth 2 (two) times daily.    Marland Kitchen escitalopram (LEXAPRO) 10 MG tablet TAKE 1 TABLET BY MOUTH EVERY DAY 30 tablet 0  . LORazepam (ATIVAN) 0.5 MG tablet TAKE 1 TABLET (0.5 MG TOTAL) BY MOUTH EVERY 8 (EIGHT)  HOURS. 60 tablet 3  . losartan (COZAAR) 100 MG tablet Take 1 tablet (100 mg total) by mouth daily. 90 tablet 0  . metoprolol succinate (TOPROL-XL) 50 MG 24 hr tablet TAKE 1 TABLET BY MOUTH EVERY DAY 30 tablet 2  . omega-3 acid ethyl esters (LOVAZA) 1 g capsule Take 2 capsules (2 g total) by mouth 2 (two) times daily. 120 capsule 3   No current facility-administered medications for this visit.   Facility-Administered Medications Ordered in Other Visits  Medication Dose Route Frequency Provider Last Rate Last Admin  . 0.9 %  sodium chloride infusion   Intravenous Continuous Derek Jack, MD   Stopped at 06/04/19 1304  . 0.9 %  sodium chloride infusion   Intravenous Continuous Derek Jack, MD   Stopped at 08/04/19 1225  . 0.9 %  sodium chloride infusion   Intravenous Continuous Derek Jack, MD 50 mL/hr at 08/05/19 1134 New Bag at 08/05/19 1134  . 0.9 %  sodium chloride infusion   Intravenous Continuous Derek Jack, MD 20 mL/hr at 08/26/19 1105 New Bag at 08/26/19 1105  . sodium chloride flush (NS) 0.9 % injection 10 mL  10 mL Intravenous PRN Derek Jack, MD   10 mL at 08/04/19 1155  . sodium chloride flush (NS) 0.9 % injection 10 mL  10 mL Intravenous PRN Derek Jack, MD   10 mL at 08/26/19 1104    ALLERGIES:  No Known Allergies  PHYSICAL EXAM:  Performance status (ECOG): 1 - Symptomatic but completely ambulatory  Vitals:   09/06/20 1557  BP: (!) 164/52  Pulse: (!) 53  Resp: 17  Temp: 98.6 F (37 C)  SpO2: 100%   Wt Readings from Last 3 Encounters:  09/06/20 225 lb 6 oz (102.2 kg)  06/08/20 213 lb 8 oz (96.8 kg)  04/03/20 204 lb (92.5 kg)   Physical Exam Vitals reviewed.  Constitutional:      Appearance: Normal appearance. He is obese.  Cardiovascular:     Rate and Rhythm: Normal rate and regular rhythm.     Pulses: Normal pulses.     Heart sounds: Normal heart sounds.  Pulmonary:     Effort: Pulmonary effort is normal.      Breath sounds: Normal breath sounds.  Chest:  Breasts:     Right: No axillary adenopathy or supraclavicular adenopathy.     Left: No axillary adenopathy or supraclavicular adenopathy.    Abdominal:     Palpations: Abdomen is soft. There is no hepatomegaly, splenomegaly or mass.     Tenderness: There is no abdominal tenderness.     Hernia: No hernia is present.  Musculoskeletal:     Right lower leg: No edema.     Left lower leg: No edema.  Lymphadenopathy:     Cervical: No cervical adenopathy.     Upper Body:     Right upper body: No supraclavicular, axillary or pectoral adenopathy.     Left  upper body: No supraclavicular, axillary or pectoral adenopathy.     Lower Body: No right inguinal adenopathy. No left inguinal adenopathy.  Neurological:     General: No focal deficit present.     Mental Status: He is alert and oriented to person, place, and time.  Psychiatric:        Mood and Affect: Mood normal.        Behavior: Behavior normal.     LABORATORY DATA:  I have reviewed the labs as listed.  CBC Latest Ref Rng & Units 07/31/2020 03/23/2020 12/29/2019  WBC 4.0 - 10.5 K/uL 7.3 5.8 4.5  Hemoglobin 13.0 - 17.0 g/dL 13.9 13.4 12.5(L)  Hematocrit 39.0 - 52.0 % 41.5 41.3 39.2  Platelets 150 - 400 K/uL 176 197 183   CMP Latest Ref Rng & Units 07/31/2020 06/08/2020 03/23/2020  Glucose 70 - 99 mg/dL 106(H) 92 114(H)  BUN 8 - 23 mg/dL 20 15 16   Creatinine 0.61 - 1.24 mg/dL 0.76 0.84 0.74  Sodium 135 - 145 mmol/L 136 142 140  Potassium 3.5 - 5.1 mmol/L 4.5 5.0 4.1  Chloride 98 - 111 mmol/L 102 102 106  CO2 22 - 32 mmol/L 27 29 28   Calcium 8.9 - 10.3 mg/dL 9.1 9.3 9.0  Total Protein 6.5 - 8.1 g/dL 6.7 6.1 6.3(L)  Total Bilirubin 0.3 - 1.2 mg/dL 0.6 0.3 0.5  Alkaline Phos 38 - 126 U/L 68 92 83  AST 15 - 41 U/L 18 16 17   ALT 0 - 44 U/L 19 16 15       Component Value Date/Time   RBC 4.36 07/31/2020 1454   MCV 95.2 07/31/2020 1454   MCH 31.9 07/31/2020 1454   MCHC 33.5  07/31/2020 1454   RDW 13.5 07/31/2020 1454   LYMPHSABS 1.5 07/31/2020 1454   MONOABS 0.7 07/31/2020 1454   EOSABS 0.4 07/31/2020 1454   BASOSABS 0.1 07/31/2020 1454    DIAGNOSTIC IMAGING:  I have independently reviewed the scans and discussed with the patient. No results found.   ASSESSMENT:  1. 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 -6 cycles of R-EPOCH from 05/11/2019 through 08/23/2019. -PET scan on 09/17/2019 showed central abdominal mass with SUV 2.9, measuring 10 x 3.7 cm. Mildly enlarged right pelvic sidewall lymph node 12 mm SUV 1.7. Areas of multifocal bone involvement with near complete resolution. [place] 2/3 in the five-point scale. -He was evaluated by lymphoma specialist Dr. Cassell Clement at the request [facility]. His case was discussed at tumor board. No adjuvant radiation therapy or maintenance rituximab or transplant was recommended at this time. -PET scan on 12/14/2019 shows soft tissue and nodularity at the root and within the small bowel mesentery, grossly similar measuring 4.2 x 11.2 cm with SUV max 3.6, similar to 3.2 previously. External iliac and inguinal lymph nodes do not show metabolic some low blood pool. SUV 2.3. -Enlarging rounded soft tissue in the right maxillary sinus with periosteal thickening and mild hypermetabolic zone. -PET scan on 03/27/2020 shows confluent soft tissue density in the central small bowel mesentery 11.6 x 4.2 cm with no significant change in size.  SUV 3.0, previously 3.6.  Small left para-aortic lymph node 1 cm with no FDG uptake.  Right external iliac lymph node, 10 mm with SUV 1.8, previously 2.3.  No new or progressive areas.    2.  Bilateral leg DVT: -Ultrasound Doppler on 04/18/2019 showed bilateral leg DVT. -He is on Eliquis since  then. -Repeat Doppler on 12/29/2019 shows previous extensive right lower extremity DVT has resolved with minor residual nonocclusive right peroneal thrombus noted. Very minimal residual thrombus burden. Negative left leg DVT.  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   PLAN:  1. Double hit lymphoma: -He does not have any B symptoms.  He is working full-time. -Reviewed his labs from 07/31/2020 which did not show any abnormalities.  Physical exam today did not reveal any palpable adenopathy or splenomegaly. -Reviewed PET scan from 07/31/2020 which showed normal mass at the root of the small bowel mesentery and central abdomen, measuring 3.9 x 11 cm, SUV 3.5 (previously 4.2 x 11.6 cm, SUV 3.0) grossly unchanged. -RTC 4 months with repeat PET scan and labs.  2. Bilateral leg DVT: -Repeat Doppler on 04/03/2020 was negative.  Eliquis was discontinued.  3. Anxiety: -Continue Ativan as needed.  4.  Hypertension: -Continue losartan, Toprol-XL and Norvasc.   Orders placed this encounter:  Orders Placed This Encounter  Procedures  . NM PET Image Restag (PS) Skull Base To Thigh  . CBC with Differential/Platelet  . Comprehensive metabolic panel  . Lactate dehydrogenase     Derek Jack, MD [place] [phone]   I, Milinda Antis, am acting as a scribe for Dr. Sanda Linger.  I, Derek Jack MD, have reviewed the above documentation for accuracy and completeness, and I agree with the above.

## 2020-09-06 NOTE — Patient Instructions (Addendum)
[place] at [facility] Discharge Instructions  You were seen today by Dr. Delton Coombes. He went over your recent results and scans. You will be scheduled to have a PET scan done prior to next visit. Continue getting your port flushed every 3 months. Dr. Delton Coombes will see you back in 4 months for labs and follow up.   Thank you for choosing [place] at [facility] to provide your oncology and hematology care.  To afford each patient quality time with our provider, please arrive at least 15 minutes before your scheduled appointment time.   If you have a lab appointment with the [facility] please come in thru the Main Entrance and check in at the main information desk  You need to re-schedule your appointment should you arrive 10 or more minutes late.  We strive to give you quality time with our providers, and arriving late affects you and other patients whose appointments are after yours.  Also, if you no show three or more times for appointments you may be dismissed from the clinic at the providers discretion.     Again, thank you for choosing [facility].  Our hope is that these requests will decrease the amount of time that you wait before being seen by our physicians.       _____________________________________________________________  Should you have questions after your visit to [facility], please contact our office at [phone] between the hours of 8:00 a.m. and 4:30 p.m.  Voicemails left after 4:00 p.m. will not be returned until the following business day.  For prescription refill requests, have your pharmacy contact our office and allow 72 hours.    Cancer Center Support Programs:   > Cancer Support Group  2nd Tuesday of the month 1pm-2pm, Journey Room

## 2020-09-07 ENCOUNTER — Encounter: Payer: Self-pay | Admitting: Family Medicine

## 2020-09-07 ENCOUNTER — Ambulatory Visit (INDEPENDENT_AMBULATORY_CARE_PROVIDER_SITE_OTHER): Payer: Medicare Other | Admitting: Family Medicine

## 2020-09-07 VITALS — BP 165/70 | HR 51 | Temp 98.3°F | Ht 66.0 in | Wt 223.8 lb

## 2020-09-07 DIAGNOSIS — Z1211 Encounter for screening for malignant neoplasm of colon: Secondary | ICD-10-CM | POA: Diagnosis not present

## 2020-09-07 DIAGNOSIS — I10 Essential (primary) hypertension: Secondary | ICD-10-CM | POA: Diagnosis not present

## 2020-09-07 DIAGNOSIS — F419 Anxiety disorder, unspecified: Secondary | ICD-10-CM | POA: Diagnosis not present

## 2020-09-07 DIAGNOSIS — Z86718 Personal history of other venous thrombosis and embolism: Secondary | ICD-10-CM | POA: Diagnosis not present

## 2020-09-07 MED ORDER — ESCITALOPRAM OXALATE 10 MG PO TABS: 10.0000 mg | ORAL_TABLET | Freq: Every day | ORAL | 1 refills | Status: DC

## 2020-09-07 MED ORDER — AMLODIPINE BESYLATE 10 MG PO TABS
10.0000 mg | ORAL_TABLET | Freq: Every day | ORAL | 1 refills | Status: DC
Start: 2020-09-07 — End: 2021-03-08

## 2020-09-07 MED ORDER — METOPROLOL SUCCINATE ER 50 MG PO TB24: 50.0000 mg | ORAL_TABLET | Freq: Every day | ORAL | 1 refills | Status: DC

## 2020-09-07 MED ORDER — HYDROCHLOROTHIAZIDE 12.5 MG PO CAPS: 12.5000 mg | ORAL_CAPSULE | Freq: Every day | ORAL | 2 refills | Status: DC

## 2020-09-07 NOTE — Progress Notes (Signed)
Assessment & Plan:  1. Essential hypertension Uncontrolled.  Continue amlodipine, losartan, and metoprolol.  Started on hydrochlorothiazide 12.5 mg once daily. - amLODipine (NORVASC) 10 MG tablet; Take 1 tablet (10 mg total) by mouth daily.  Dispense: 90 tablet; Refill: 1 - metoprolol succinate (TOPROL-XL) 50 MG 24 hr tablet; Take 1 tablet (50 mg total) by mouth daily. Take with or immediately following a meal.  Dispense: 90 tablet; Refill: 1 - hydrochlorothiazide (MICROZIDE) 12.5 MG capsule; Take 1 capsule (12.5 mg total) by mouth daily.  Dispense: 30 capsule; Refill: 2  2. Anxiety Well controlled on current regimen.  - escitalopram (LEXAPRO) 10 MG tablet; Take 1 tablet (10 mg total) by mouth daily.  Dispense: 90 tablet; Refill: 1  3. History of DVT of lower extremity Resolved.  4. Colon cancer screening Patient is ready for a colonoscopy now. - Ambulatory referral to Gastroenterology   Return in about 2 months (around 11/07/2020) for HTN.  Kevin Limes, MSN, APRN, FNP-C [facility]  Subjective:    Patient ID: Kevin [name], male    DOB: [birth year], 65 y.o.   MRN: [id number]  Patient Care Team: Loman [place], FNP as PCP - General (Family Medicine) Gala Romney Cristopher Estimable, MD as Consulting Physician (Gastroenterology)   Chief Complaint:  Chief Complaint  Patient presents with  . Anxiety  . Hypertension    3 month follow up of chronic medical conditions     HPI: Kevin [name] is a 65 y.o. male presenting on 09/07/2020 for Anxiety and Hypertension (3 month follow up of chronic medical conditions )  Hypertension: Patient is taking amlodipine 10 mg once daily, losartan 100 mg once daily, and metoprolol 50 mg once daily which is not controlling his blood pressure.  His heart rate is in the 50s; he denies any symptoms of bradycardia.  He does report some mild edema to his lower extremities.  History of DVT: Patient has completed treatment with  Eliquis.  Anxiety: Doing well with Lexapro 10 mg once daily.  GAD 7 : Generalized Anxiety Score 09/07/2020 06/08/2020  Nervous, Anxious, on Edge 0 0  Control/stop worrying 0 0  Worry too much - different things 0 0  Trouble relaxing 0 0  Restless 0 0  Easily annoyed or irritable 0 1  Afraid - awful might happen 0 0  Total GAD 7 Score 0 1  Anxiety Difficulty Not difficult at all -   Depression screen [facility] 2/9 09/07/2020 06/08/2020 12/09/2019  Decreased Interest 0 0 0  Down, Depressed, Hopeless 0 0 0  PHQ - 2 Score 0 0 0  Altered sleeping 0 - -  Tired, decreased energy 0 - -  Change in appetite 0 - -  Feeling bad or failure about yourself  0 - -  Trouble concentrating 0 - -  Moving slowly or fidgety/restless 0 - -  Suicidal thoughts 0 - -  PHQ-9 Score 0 - -  Difficult doing work/chores Not difficult at all - -   New complaints: None  Social history:  Relevant past medical, surgical, family and social history reviewed and updated as indicated. Interim medical history since our last visit reviewed.  Allergies and medications reviewed and updated.  DATA REVIEWED: CHART IN EPIC  ROS: Negative unless specifically indicated above in HPI.    Current Outpatient Medications:  .  amLODipine (NORVASC) 10 MG tablet, TAKE 1 TABLET BY MOUTH EVERY DAY, Disp: 30 tablet, Rfl: 2 .  apixaban (ELIQUIS) 2.5 MG TABS tablet,  Take 2.5 mg by mouth 2 (two) times daily., Disp: , Rfl:  .  escitalopram (LEXAPRO) 10 MG tablet, TAKE 1 TABLET BY MOUTH EVERY DAY, Disp: 30 tablet, Rfl: 0 .  LORazepam (ATIVAN) 0.5 MG tablet, TAKE 1 TABLET (0.5 MG TOTAL) BY MOUTH EVERY 8 (EIGHT) HOURS., Disp: 60 tablet, Rfl: 3 .  losartan (COZAAR) 100 MG tablet, Take 1 tablet (100 mg total) by mouth daily., Disp: 90 tablet, Rfl: 0 .  metoprolol succinate (TOPROL-XL) 50 MG 24 hr tablet, TAKE 1 TABLET BY MOUTH EVERY DAY, Disp: 30 tablet, Rfl: 2 .  omega-3 acid ethyl esters (LOVAZA) 1 g capsule, Take 2 capsules (2 g total) by  mouth 2 (two) times daily., Disp: 120 capsule, Rfl: 3 No current facility-administered medications for this visit.  Facility-Administered Medications Ordered in Other Visits:  .  0.9 %  sodium chloride infusion, , Intravenous, Continuous, Derek Jack, MD, Stopped at 06/04/19 1304 .  0.9 %  sodium chloride infusion, , Intravenous, Continuous, Derek Jack, MD, Stopped at 08/04/19 1225 .  0.9 %  sodium chloride infusion, , Intravenous, Continuous, Derek Jack, MD, Last Rate: 50 mL/hr at 08/05/19 1134, New Bag at 08/05/19 1134 .  0.9 %  sodium chloride infusion, , Intravenous, Continuous, Derek Jack, MD, Last Rate: 20 mL/hr at 08/26/19 1105, New Bag at 08/26/19 1105 .  sodium chloride flush (NS) 0.9 % injection 10 mL, 10 mL, Intravenous, PRN, Derek Jack, MD, 10 mL at 08/04/19 1155 .  sodium chloride flush (NS) 0.9 % injection 10 mL, 10 mL, Intravenous, PRN, Derek Jack, MD, 10 mL at 08/26/19 1104   No Known Allergies Past Medical History:  Diagnosis Date  . Anxiety   . Depression   . Diffuse large B cell lymphoma ([place]) 04/26/2019  . Hyperlipidemia 02/01/2014  . Hypertension   . Leg DVT (deep venous thromboembolism), acute, bilateral ([place]) 04/18/2019  . Port-A-Cath in place 04/27/2019    Past Surgical History:  Procedure Laterality Date  . CYSTOSCOPY W/ URETERAL STENT PLACEMENT Bilateral 04/21/2019   Procedure: CYSTOSCOPY WITH RETROGRADE PYELOGRAM/URETERAL STENT PLACEMENT;  Surgeon: Cleon Gustin, MD;  Location: [facility];  Service: Urology;  Laterality: Bilateral;  . ETHMOIDECTOMY Right 02/04/2020   Procedure: ETHMOIDECTOMY;  Surgeon: Leta Baptist, MD;  Location: [place];  Service: ENT;  Laterality: Right;  . INGUINAL LYMPH NODE BIOPSY Right 04/21/2019   Procedure: INGUINAL LYMPH NODE BIOPSY;  Surgeon: Virl Cagey, MD;  Location: [facility];  Service: General;  Laterality: Right;  . MAXILLARY ANTROSTOMY Right 02/04/2020    Procedure: MAXILLARY ANTROSTOMY WITH TISSUE REMOVAL;  Surgeon: Leta Baptist, MD;  Location: [place];  Service: ENT;  Laterality: Right;  . PORTACATH PLACEMENT Left 05/07/2019   Procedure: INSERTION PORT-A-CATH (catheter left subclavian);  Surgeon: Virl Cagey, MD;  Location: [facility];  Service: General;  Laterality: Left;    Social History   Socioeconomic History  . Marital status: Married    Spouse name: Not on file  . Number of children: Not on file  . Years of education: Not on file  . Highest education level: Not on file  Occupational History  . Not on file  Tobacco Use  . Smoking status: Former Smoker    Quit date: 04/27/1999    Years since quitting: 21.3  . Smokeless tobacco: Never Used  Vaping Use  . Vaping Use: Never used  Substance and Sexual Activity  . Alcohol use: Yes    Comment: daily. 2-6 beer  daily  . Drug use: Never  . Sexual activity: Not on file  Other Topics Concern  . Not on file  Social History Narrative  . Not on file   Social Determinants of Health   Financial Resource Strain: Not on file  Food Insecurity: Not on file  Transportation Needs: Not on file  Physical Activity: Not on file  Stress: Not on file  Social Connections: Not on file  Intimate Partner Violence: Not on file        Objective:    BP (!) 165/70   Pulse (!) 51   Temp 98.3 F (36.8 C) (Temporal)   Ht 5\' 6"  (1.676 m)   Wt 223 lb 12.8 oz (101.5 kg)   SpO2 96%   BMI 36.12 kg/m   Wt Readings from Last 3 Encounters:  09/07/20 223 lb 12.8 oz (101.5 kg)  09/06/20 225 lb 6 oz (102.2 kg)  06/08/20 213 lb 8 oz (96.8 kg)    Physical Exam Vitals reviewed.  Constitutional:      General: He is not in acute distress.    Appearance: Normal appearance. He is morbidly obese. He is not ill-appearing, toxic-appearing or diaphoretic.  HENT:     Head: Normocephalic and atraumatic.  Eyes:     General: No scleral icterus.       Right eye: No discharge.         Left eye: No discharge.     Conjunctiva/sclera: Conjunctivae normal.  Cardiovascular:     Rate and Rhythm: Normal rate and regular rhythm.     Heart sounds: Normal heart sounds. No murmur heard. No friction rub. No gallop.   Pulmonary:     Effort: Pulmonary effort is normal. No respiratory distress.     Breath sounds: Normal breath sounds. No stridor. No wheezing, rhonchi or rales.  Musculoskeletal:        General: Normal range of motion.     Cervical back: Normal range of motion.  Skin:    General: Skin is warm and dry.  Neurological:     Mental Status: He is alert and oriented to person, place, and time. Mental status is at baseline.  Psychiatric:        Mood and Affect: Mood normal.        Behavior: Behavior normal.        Thought Content: Thought content normal.        Judgment: Judgment normal.     Lab Results  Component Value Date   TSH 1.240 06/08/2020   Lab Results  Component Value Date   WBC 7.3 07/31/2020   HGB 13.9 07/31/2020   HCT 41.5 07/31/2020   MCV 95.2 07/31/2020   PLT 176 07/31/2020   Lab Results  Component Value Date   NA 136 07/31/2020   K 4.5 07/31/2020   CO2 27 07/31/2020   GLUCOSE 106 (H) 07/31/2020   BUN 20 07/31/2020   CREATININE 0.76 07/31/2020   BILITOT 0.6 07/31/2020   ALKPHOS 68 07/31/2020   AST 18 07/31/2020   ALT 19 07/31/2020   PROT 6.7 07/31/2020   ALBUMIN 4.1 07/31/2020   CALCIUM 9.1 07/31/2020   ANIONGAP 7 07/31/2020   Lab Results  Component Value Date   CHOL 172 06/08/2020   Lab Results  Component Value Date   HDL 64 06/08/2020   Lab Results  Component Value Date   LDLCALC 72 06/08/2020   Lab Results  Component Value Date   TRIG 219 (H) 06/08/2020  Lab Results  Component Value Date   CHOLHDL 2.7 06/08/2020   No results found for: HGBA1C

## 2020-09-12 ENCOUNTER — Encounter (HOSPITAL_COMMUNITY): Payer: Self-pay

## 2020-09-12 ENCOUNTER — Other Ambulatory Visit: Payer: Self-pay

## 2020-09-12 ENCOUNTER — Inpatient Hospital Stay (HOSPITAL_COMMUNITY): Payer: Medicare Other

## 2020-09-12 VITALS — BP 165/67 | HR 50 | Temp 97.3°F | Resp 18

## 2020-09-12 DIAGNOSIS — C8338 Diffuse large B-cell lymphoma, lymph nodes of multiple sites: Secondary | ICD-10-CM

## 2020-09-12 MED ORDER — SODIUM CHLORIDE 0.9% FLUSH
10.0000 mL | Freq: Once | INTRAVENOUS | Status: AC
  Administered 2020-09-12: 10 mL via INTRAVENOUS

## 2020-09-12 MED ORDER — HEPARIN SOD (PORK) LOCK FLUSH 100 UNIT/ML IV SOLN
500.0000 [IU] | Freq: Once | INTRAVENOUS | Status: AC
  Administered 2020-09-12: 500 [IU] via INTRAVENOUS

## 2020-09-12 NOTE — Progress Notes (Signed)
Patients port flushed without difficulty.  Good blood return noted with no bruising or swelling noted at site.  Band aid applied.  VSS with discharge and left in satisfactory condition with no s/s of distress noted.   

## 2020-09-14 ENCOUNTER — Encounter: Payer: Self-pay | Admitting: Internal Medicine

## 2020-10-16 NOTE — Progress Notes (Signed)
Referring Provider: Loman Brooklyn, FNP Primary Care Physician:  Loman Brooklyn, FNP Primary GI Physician: Dr. Gala Romney  Chief Complaint  Patient presents with  . Colonoscopy    Never had tcs. Doing ok    HPI:   Kevin [name] is a 65 y.o. male presenting today to discuss scheduling first-ever screening colonoscopy.  Patient has history significant for diffuse large B-cell lymphoma following with Dr. Delton Coombes.  Also had bilateral DVT in October 2020 and was started on Eliquis.  Repeat Doppler in October 2021 was negative Eliquis was discontinued.  Today:  Reports he is doing well overall. No GI concerns. Denies abdominal pain, constipation, diarrhea, blood in the stools, black stool, unintentional weight loss. Denies nausea, vomiting, GERD symptoms, or dysphagia.   Past Medical History:  Diagnosis Date  . Anxiety   . Depression   . Diffuse large B cell lymphoma ([place]) 04/26/2019  . Hyperlipidemia 02/01/2014  . Hypertension   . Leg DVT (deep venous thromboembolism), acute, bilateral ([place]) 04/18/2019  . Port-A-Cath in place 04/27/2019    Past Surgical History:  Procedure Laterality Date  . CYSTOSCOPY W/ URETERAL STENT PLACEMENT Bilateral 04/21/2019   Procedure: CYSTOSCOPY WITH RETROGRADE PYELOGRAM/URETERAL STENT PLACEMENT;  Surgeon: Cleon Gustin, MD;  Location: [facility];  Service: Urology;  Laterality: Bilateral;  . ETHMOIDECTOMY Right 02/04/2020   Procedure: ETHMOIDECTOMY;  Surgeon: Leta Baptist, MD;  Location: [facility];  Service: ENT;  Laterality: Right;  . INGUINAL LYMPH NODE BIOPSY Right 04/21/2019   Procedure: INGUINAL LYMPH NODE BIOPSY;  Surgeon: Virl Cagey, MD;  Location: [facility];  Service: General;  Laterality: Right;  . MAXILLARY ANTROSTOMY Right 02/04/2020   Procedure: MAXILLARY ANTROSTOMY WITH TISSUE REMOVAL;  Surgeon: Leta Baptist, MD;  Location: [facility];  Service: ENT;  Laterality: Right;  . PORTACATH PLACEMENT Left 05/07/2019    Procedure: INSERTION PORT-A-CATH (catheter left subclavian);  Surgeon: Virl Cagey, MD;  Location: [facility];  Service: General;  Laterality: Left;    Current Outpatient Medications  Medication Sig Dispense Refill  . amLODipine (NORVASC) 10 MG tablet Take 1 tablet (10 mg total) by mouth daily. 90 tablet 1  . escitalopram (LEXAPRO) 10 MG tablet Take 1 tablet (10 mg total) by mouth daily. 90 tablet 1  . hydrochlorothiazide (MICROZIDE) 12.5 MG capsule Take 1 capsule (12.5 mg total) by mouth daily. 30 capsule 2  . LORazepam (ATIVAN) 0.5 MG tablet TAKE 1 TABLET (0.5 MG TOTAL) BY MOUTH EVERY 8 (EIGHT) HOURS. (Patient taking differently: Take 0.5 mg by mouth every 8 (eight) hours as needed.) 60 tablet 3  . losartan (COZAAR) 100 MG tablet Take 1 tablet (100 mg total) by mouth daily. 90 tablet 0  . metoprolol succinate (TOPROL-XL) 50 MG 24 hr tablet Take 1 tablet (50 mg total) by mouth daily. Take with or immediately following a meal. 90 tablet 1  . omega-3 acid ethyl esters (LOVAZA) 1 g capsule Take 2 capsules (2 g total) by mouth 2 (two) times daily. 120 capsule 3   No current facility-administered medications for this visit.   Facility-Administered Medications Ordered in Other Visits  Medication Dose Route Frequency Provider Last Rate Last Admin  . 0.9 %  sodium chloride infusion   Intravenous Continuous Derek Jack, MD 20 mL/hr at 08/26/19 1105 New Bag at 08/26/19 1105  . sodium chloride flush (NS) 0.9 % injection 10 mL  10 mL Intravenous PRN Derek Jack, MD   10 mL at 08/26/19 1104  Allergies as of 10/18/2020  . (No Known Allergies)    Family History  Problem Relation Age of Onset  . Diabetes Brother   . Diabetes Mother   . Leukemia Brother   . Diabetes Son   . Colon cancer Neg Hx     Social History   Socioeconomic History  . Marital status: Married    Spouse name: Not on file  . Number of children: Not on file  . Years of education: Not on file  .  Highest education level: Not on file  Occupational History  . Not on file  Tobacco Use  . Smoking status: Former Smoker    Quit date: 04/27/1999    Years since quitting: 21.4  . Smokeless tobacco: Never Used  Vaping Use  . Vaping Use: Never used  Substance and Sexual Activity  . Alcohol use: Yes    Comment: daily. 2-6 beer daily  . Drug use: Never  . Sexual activity: Not on file  Other Topics Concern  . Not on file  Social History Narrative  . Not on file   Social Determinants of Health   Financial Resource Strain: Not on file  Food Insecurity: Not on file  Transportation Needs: Not on file  Physical Activity: Not on file  Stress: Not on file  Social Connections: Not on file    Review of Systems: Gen: Denies fever, chills, cold or flulike symptoms, lightheadedness, dizziness, presyncope, syncope. CV: Denies chest pain or palpitations. Resp: Denies dyspnea or cough. GI: See HPI Heme: See HPI  Physical Exam: BP (!) 165/68   Pulse (!) 51   Temp (!) 97.3 F (36.3 C) (Temporal)   Ht 5\' 6"  (1.676 m)   Wt 224 lb (101.6 kg)   BMI 36.15 kg/m  General:   Alert and oriented. No distress noted. Pleasant and cooperative.  Head:  Normocephalic and atraumatic. Eyes:  Conjuctiva clear without scleral icterus. Heart:  S1, S2 present without murmurs appreciated. Lungs:  Clear to auscultation bilaterally. No wheezes, rales, or rhonchi. No distress.  Abdomen:  +BS, soft, non-tender and non-distended. No rebound or guarding. No HSM or masses noted. Msk:  Symmetrical without gross deformities. Normal posture. Extremities:  Without edema. Neurologic:  Alert and  oriented x4 Psych: Normal mood and affect.   Assessment: 65 year old male with history of bilateral DVT in October 2020 treated with Eliquis with documented resolution and Eliquis discontinued, diffuse large B-cell lymphoma following with oncology, HTN, HLD, anxiety, depression, and daily alcohol use presenting today to  schedule first-ever screening colonoscopy.  No significant upper or lower GI symptoms.  No alarm symptoms.  No family history of colon cancer.   Plan: 1.  Proceed with colonoscopy with propofol with Dr. Gala Romney in the near future. The risks, benefits, and alternatives have been discussed with the patient in detail. The patient states understanding and desires to proceed.  ASA III  2.  Follow-up as needed.    Aliene Altes, PA-C [facility] 10/18/2020

## 2020-10-18 ENCOUNTER — Encounter: Payer: Self-pay | Admitting: Gastroenterology

## 2020-10-18 ENCOUNTER — Encounter: Payer: Self-pay | Admitting: *Deleted

## 2020-10-18 ENCOUNTER — Other Ambulatory Visit: Payer: Self-pay

## 2020-10-18 ENCOUNTER — Ambulatory Visit (INDEPENDENT_AMBULATORY_CARE_PROVIDER_SITE_OTHER): Payer: Medicare Other | Admitting: Gastroenterology

## 2020-10-18 VITALS — BP 165/68 | HR 51 | Temp 97.3°F | Ht 66.0 in | Wt 224.0 lb

## 2020-10-18 DIAGNOSIS — Z01818 Encounter for other preprocedural examination: Secondary | ICD-10-CM

## 2020-10-18 DIAGNOSIS — Z1211 Encounter for screening for malignant neoplasm of colon: Secondary | ICD-10-CM

## 2020-10-18 MED ORDER — PEG 3350-KCL-NA BICARB-NACL 420 G PO SOLR: ORAL | 0 refills | Status: DC

## 2020-10-18 NOTE — Patient Instructions (Addendum)
We will arrange for you to have a colonoscopy in the near future with Dr. Gala Romney.  We will follow-up as needed. Do not hesitate to call with questions or concerns.   Aliene Altes, PA-C [facility]

## 2020-10-27 ENCOUNTER — Other Ambulatory Visit: Payer: Self-pay | Admitting: Family Medicine

## 2020-10-27 DIAGNOSIS — E781 Pure hyperglyceridemia: Secondary | ICD-10-CM

## 2020-11-08 ENCOUNTER — Encounter: Payer: Self-pay | Admitting: Family Medicine

## 2020-11-08 ENCOUNTER — Ambulatory Visit (INDEPENDENT_AMBULATORY_CARE_PROVIDER_SITE_OTHER): Payer: Medicare Other | Admitting: Family Medicine

## 2020-11-08 ENCOUNTER — Other Ambulatory Visit: Payer: Self-pay

## 2020-11-08 VITALS — BP 152/62 | HR 51 | Temp 97.8°F | Ht 66.0 in | Wt 224.6 lb

## 2020-11-08 DIAGNOSIS — R635 Abnormal weight gain: Secondary | ICD-10-CM | POA: Diagnosis not present

## 2020-11-08 DIAGNOSIS — I10 Essential (primary) hypertension: Secondary | ICD-10-CM | POA: Diagnosis not present

## 2020-11-08 MED ORDER — LOSARTAN POTASSIUM-HCTZ 100-25 MG PO TABS: 1.0000 | ORAL_TABLET | Freq: Every day | ORAL | 1 refills | Status: DC

## 2020-11-08 NOTE — Progress Notes (Signed)
Assessment & Plan:  1. Essential hypertension Uncontrolled.  Hydrochlorothiazide increased from 12.5 mg to 25 mg once daily.  Since he is on both this and losartan a combination tablet was sent to the pharmacy. - losartan-hydrochlorothiazide (HYZAAR) 100-25 MG tablet; Take 1 tablet by mouth daily.  Dispense: 90 tablet; Refill: 1  2. Weight gain Patient previously lost a lot of weight when he started treatment for lymphoma.  Since completing his treatments he has been gaining weight back.  He had gained back 17 lbs prior to starting Lexapro.   Return in about 2 months (around 01/08/2021) for HTN.  Hendricks Limes, MSN, APRN, FNP-C [facility]  Subjective:    Patient ID: Kevin [name], male    DOB: 09-Apr-1956, 66 y.o.   MRN: [id number]  Patient Care Team: Loman Brooklyn, FNP as PCP - General (Family Medicine) Gala Romney Cristopher Estimable, MD as Consulting Physician (Gastroenterology)   Chief Complaint:  Chief Complaint  Patient presents with  . Hypertension    2 month follow up    HPI: Kevin [name] is a 65 y.o. male presenting on 11/08/2020 for Hypertension (2 month follow up)  Patient is following up on hypertension.  At his last visit he was started on hydrochlorothiazide 12.5 mg once daily in addition to amlodipine 10 mg once daily, losartan 100 mg once daily, and metoprolol 50 mg once daily.  He does not check his blood pressure at home.  New complaints: Patient is questioning if his weight gain is due to Lexapro.  Social history:  Relevant past medical, surgical, family and social history reviewed and updated as indicated. Interim medical history since our last visit reviewed.  Allergies and medications reviewed and updated.  DATA REVIEWED: CHART IN EPIC  ROS: Negative unless specifically indicated above in HPI.    Current Outpatient Medications:  .  amLODipine (NORVASC) 10 MG tablet, Take 1 tablet (10 mg total) by mouth daily., Disp: 90 tablet, Rfl:  1 .  escitalopram (LEXAPRO) 10 MG tablet, Take 1 tablet (10 mg total) by mouth daily., Disp: 90 tablet, Rfl: 1 .  hydrochlorothiazide (MICROZIDE) 12.5 MG capsule, Take 1 capsule (12.5 mg total) by mouth daily., Disp: 30 capsule, Rfl: 2 .  LORazepam (ATIVAN) 0.5 MG tablet, TAKE 1 TABLET (0.5 MG TOTAL) BY MOUTH EVERY 8 (EIGHT) HOURS. (Patient taking differently: Take 0.5 mg by mouth every 8 (eight) hours as needed.), Disp: 60 tablet, Rfl: 3 .  losartan (COZAAR) 100 MG tablet, Take 1 tablet (100 mg total) by mouth daily., Disp: 90 tablet, Rfl: 0 .  metoprolol succinate (TOPROL-XL) 50 MG 24 hr tablet, Take 1 tablet (50 mg total) by mouth daily. Take with or immediately following a meal., Disp: 90 tablet, Rfl: 1 .  omega-3 acid ethyl esters (LOVAZA) 1 g capsule, TAKE 2 CAPSULES BY MOUTH 2 TIMES DAILY., Disp: 120 capsule, Rfl: 4 .  polyethylene glycol-electrolytes (NULYTELY) 420 g solution, As directed, Disp: 4000 mL, Rfl: 0 No current facility-administered medications for this visit.  Facility-Administered Medications Ordered in Other Visits:  .  0.9 %  sodium chloride infusion, , Intravenous, Continuous, Derek Jack, MD, Last Rate: 20 mL/hr at 08/26/19 1105, New Bag at 08/26/19 1105 .  sodium chloride flush (NS) 0.9 % injection 10 mL, 10 mL, Intravenous, PRN, Derek Jack, MD, 10 mL at 08/26/19 1104   No Known Allergies Past Medical History:  Diagnosis Date  . Anxiety   . Depression   . Diffuse large B cell  lymphoma ([place]) 04/26/2019  . Hyperlipidemia 02/01/2014  . Hypertension   . Leg DVT (deep venous thromboembolism), acute, bilateral ([place]) 04/18/2019  . Port-A-Cath in place 04/27/2019    Past Surgical History:  Procedure Laterality Date  . CYSTOSCOPY W/ URETERAL STENT PLACEMENT Bilateral 04/21/2019   Procedure: CYSTOSCOPY WITH RETROGRADE PYELOGRAM/URETERAL STENT PLACEMENT;  Surgeon: Cleon Gustin, MD;  Location: [facility];  Service: Urology;  Laterality: Bilateral;  .  ETHMOIDECTOMY Right 02/04/2020   Procedure: ETHMOIDECTOMY;  Surgeon: Leta Baptist, MD;  Location: [place];  Service: ENT;  Laterality: Right;  . INGUINAL LYMPH NODE BIOPSY Right 04/21/2019   Procedure: INGUINAL LYMPH NODE BIOPSY;  Surgeon: Virl Cagey, MD;  Location: [facility];  Service: General;  Laterality: Right;  . MAXILLARY ANTROSTOMY Right 02/04/2020   Procedure: MAXILLARY ANTROSTOMY WITH TISSUE REMOVAL;  Surgeon: Leta Baptist, MD;  Location: [place];  Service: ENT;  Laterality: Right;  . PORTACATH PLACEMENT Left 05/07/2019   Procedure: INSERTION PORT-A-CATH (catheter left subclavian);  Surgeon: Virl Cagey, MD;  Location: [facility];  Service: General;  Laterality: Left;    Social History   Socioeconomic History  . Marital status: Married    Spouse name: Not on file  . Number of children: Not on file  . Years of education: Not on file  . Highest education level: Not on file  Occupational History  . Not on file  Tobacco Use  . Smoking status: Former Smoker    Quit date: 04/27/1999    Years since quitting: 21.5  . Smokeless tobacco: Never Used  Vaping Use  . Vaping Use: Never used  Substance and Sexual Activity  . Alcohol use: Yes    Comment: daily. 2-6 beer daily  . Drug use: Never  . Sexual activity: Not on file  Other Topics Concern  . Not on file  Social History Narrative  . Not on file   Social Determinants of Health   Financial Resource Strain: Not on file  Food Insecurity: Not on file  Transportation Needs: Not on file  Physical Activity: Not on file  Stress: Not on file  Social Connections: Not on file  Intimate Partner Violence: Not on file        Objective:    BP (!) 152/62 Comment: manual  Pulse (!) 51   Temp 97.8 F (36.6 C) (Temporal)   Ht 5\' 6"  (1.676 m)   Wt 224 lb 9.6 oz (101.9 kg)   SpO2 91%   BMI 36.25 kg/m   Wt Readings from Last 3 Encounters:  11/08/20 224 lb 9.6 oz (101.9 kg)  10/18/20 224 lb (101.6  kg)  09/07/20 223 lb 12.8 oz (101.5 kg)    Physical Exam Vitals reviewed.  Constitutional:      General: He is not in acute distress.    Appearance: Normal appearance. He is obese. He is not ill-appearing, toxic-appearing or diaphoretic.  HENT:     Head: Normocephalic and atraumatic.  Eyes:     General: No scleral icterus.       Right eye: No discharge.        Left eye: No discharge.     Conjunctiva/sclera: Conjunctivae normal.  Cardiovascular:     Rate and Rhythm: Normal rate and regular rhythm.     Heart sounds: Normal heart sounds. No murmur heard. No friction rub. No gallop.   Pulmonary:     Effort: Pulmonary effort is normal. No respiratory distress.     Breath  sounds: Normal breath sounds. No stridor. No wheezing, rhonchi or rales.  Musculoskeletal:        General: Normal range of motion.     Cervical back: Normal range of motion.  Skin:    General: Skin is warm and dry.  Neurological:     Mental Status: He is alert and oriented to person, place, and time. Mental status is at baseline.  Psychiatric:        Mood and Affect: Mood normal.        Behavior: Behavior normal.        Thought Content: Thought content normal.        Judgment: Judgment normal.     Lab Results  Component Value Date   TSH 1.240 06/08/2020   Lab Results  Component Value Date   WBC 7.3 07/31/2020   HGB 13.9 07/31/2020   HCT 41.5 07/31/2020   MCV 95.2 07/31/2020   PLT 176 07/31/2020   Lab Results  Component Value Date   NA 136 07/31/2020   K 4.5 07/31/2020   CO2 27 07/31/2020   GLUCOSE 106 (H) 07/31/2020   BUN 20 07/31/2020   CREATININE 0.76 07/31/2020   BILITOT 0.6 07/31/2020   ALKPHOS 68 07/31/2020   AST 18 07/31/2020   ALT 19 07/31/2020   PROT 6.7 07/31/2020   ALBUMIN 4.1 07/31/2020   CALCIUM 9.1 07/31/2020   ANIONGAP 7 07/31/2020   Lab Results  Component Value Date   CHOL 172 06/08/2020   Lab Results  Component Value Date   HDL 64 06/08/2020   Lab Results   Component Value Date   LDLCALC 72 06/08/2020   Lab Results  Component Value Date   TRIG 219 (H) 06/08/2020   Lab Results  Component Value Date   CHOLHDL 2.7 06/08/2020   No results found for: HGBA1C

## 2020-11-14 IMAGING — PT NM PET TUM IMG RESTAG (PS) SKULL BASE T - THIGH
1 series · 5 of 5 positions shown · non-contrast
Comparison: 09/17/2019, CT abdomen pelvis 06/22/2019 and CT chest
abdomen pelvis 04/18/2019.

CLINICAL DATA: Subsequent treatment strategy for lymphoma.

EXAM:
NUCLEAR MEDICINE PET SKULL BASE TO THIGH
TECHNIQUE: 10.8 mCi F-18 FDG was injected intravenously. Full-ring PET imaging
was performed from the skull base to thigh after the radiotracer. CT
data was obtained and used for attenuation correction and anatomic
localization.
Fasting blood glucose: 90 mg/dl

[Series 1875: results mm oncology reading · 5.0mm · 1.06mm/px · 5 of 5 slices shown]
[im 1/5]
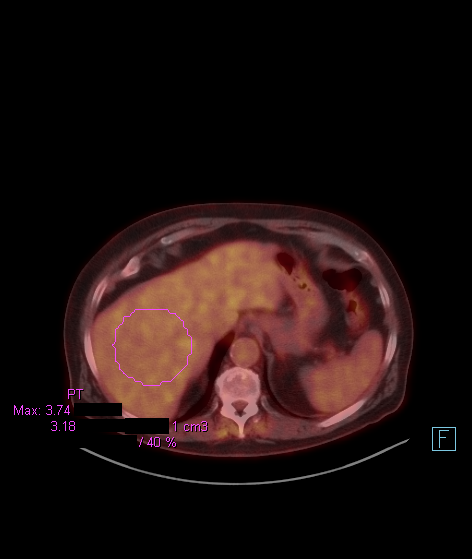
[im 2/5]
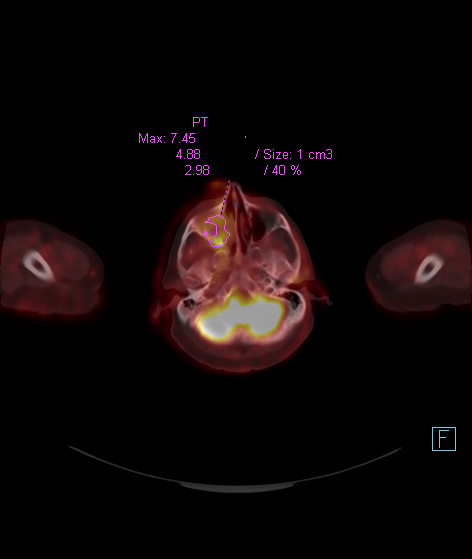
[im 3/5]
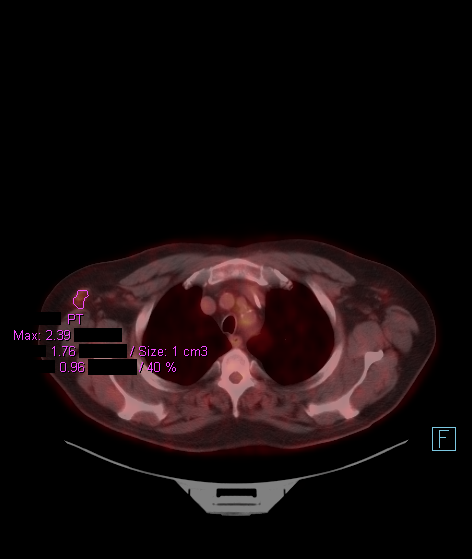
[im 4/5]
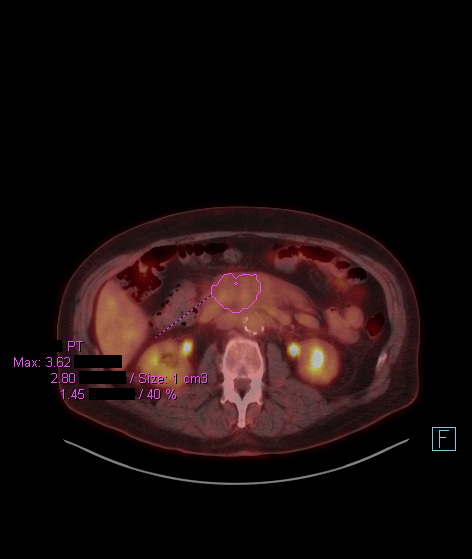
[im 5/5]
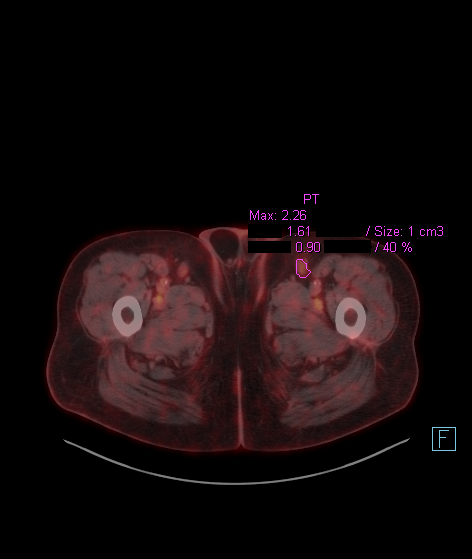

[5 of 5 positions shown; findings below may reference images not displayed]

FINDINGS: Mediastinal blood pool activity: SUV max

Liver activity: SUV max

NECK:

Enlarging soft tissue within the right maxillary sinus extends into
the nasal cavity. Associated thickening of the walls of the right
maxillary sinus with an SUV max of 7.5. Focal uptake in the right
triceps musculature without a CT correlate. No hypermetabolic lymph
nodes. Hypermetabolic periodontal disease is again noted.

Incidental CT findings:

None.

CHEST:

Axillary lymph nodes do not show hypermetabolism above blood pool,
SUV max 2.4 on the right. No hypermetabolic mediastinal or hilar
lymph nodes. No hypermetabolic pulmonary nodules.

Incidental CT findings:

Left-sided Port-A-Cath terminates in the SVC. Atherosclerotic
calcification of the aorta, aortic valve and coronary arteries.
Heart is at the upper limits of normal in size to mildly enlarged.
Small amount of pericardial fluid may be physiologic. No pleural
effusion. Probable peribronchovascular nodularity in the peripheral
right lower lobe (8/38).

ABDOMEN/PELVIS:

No abnormal hypermetabolism in the liver, adrenal glands, spleen or
pancreas. Gastrohepatic ligament lymph nodes are borderline
hypermetabolic. Index 6 mm lymph node (4/107) has an SUV max of 2.7.
Soft tissue and nodularity at the root and within the small bowel
mesentery appear grossly similar, measuring 4.2 x 11.2 cm (4/127)
with an SUV max 3.6, similar to 3.2 previously. External iliac and
inguinal lymph nodes do not show metabolism above blood pool, SUV
max 2.3. Mild testicular hypermetabolism, as before.

Incidental CT findings:

Liver, gallbladder and right adrenal gland are unremarkable. Fluid
density nodule in the medial limb left adrenal gland measures
cm. Right kidney is unremarkable. Left caliectasis or mild left
hydronephrosis. Ureters are decompressed and previously seen
double-J ureteral stents have been removed. Bladder is thick-walled
and low in volume. Spleen is normal in size. Pancreas, stomach and
bowel are unremarkable. Atherosclerotic calcification of the aorta
without aneurysm.

SKELETON:

No residual abnormal osseous hypermetabolism.

Incidental CT findings:

Sclerotic lesion in the left iliac wing is unchanged and likely a
bone island. Bilateral L5 pars defects with grade 1 anterolisthesis.
Degenerative changes in the spine.
IMPRESSION: 1. Small bowel mesenteric soft tissue mass/nodularity with mild
hypermetabolism ([HOSPITAL] 3).
2. Additional small lymph nodes in the chest, abdomen and pelvis
with metabolism at or below mediastinum ([HOSPITAL] 2).
3. Enlarging rounded soft tissue in the right maxillary sinus with
periosteal thickening and mild hypermetabolism. Maxillofacial CT
with contrast is recommended in further evaluation as malignancy
cannot be excluded.
4. Left adrenal adenoma.
5. Aortic atherosclerosis ([id number].W). Coronary artery
calcification.

## 2020-11-30 ENCOUNTER — Ambulatory Visit (INDEPENDENT_AMBULATORY_CARE_PROVIDER_SITE_OTHER): Payer: Medicare Other | Admitting: *Deleted

## 2020-11-30 DIAGNOSIS — Z0000 Encounter for general adult medical examination without abnormal findings: Secondary | ICD-10-CM

## 2020-11-30 NOTE — Progress Notes (Signed)
MEDICARE ANNUAL WELLNESS VISIT  11/30/2020  Telephone Visit Disclaimer This Medicare AWV was conducted by telephone due to national recommendations for restrictions regarding the COVID-19 Pandemic (e.g. social distancing).  I verified, using two identifiers, that I am speaking with Kevin [name] or their authorized healthcare agent. I discussed the limitations, risks, security, and privacy concerns of performing an evaluation and management service by telephone and the potential availability of an in-person appointment in the future. The patient expressed understanding and agreed to proceed.  Location of Patient: home Location of Provider (nurse): office  Subjective:    Kevin [name] is a 65 y.o. male patient of Loman Brooklyn, FNP who had a Medicare Annual Wellness Visit today via telephone. Brandon is Working part time and lives with their spouse. he has 3 children. he reports that he is not socially active and does interact with friends/family regularly. he is not physically active and enjoys riding motorcycle..  Patient Care Team: Loman Brooklyn, FNP as PCP - General (Family Medicine) Gala Romney, Cristopher Estimable, MD as Consulting Physician (Gastroenterology) Derek Jack, MD as Consulting Physician (Hematology)  Advanced Directives 11/30/2020 09/12/2020 09/06/2020 03/29/2020 02/17/2020 02/04/2020 02/01/2020  Does Patient Have a Medical Advance Directive? No No No No No No No  Would patient like information on creating a medical advance directive? No - Patient declined No - Patient declined No - Patient declined No - Patient declined No - Patient declined No - Patient declined No - Patient declined    Hospital Utilization Over the Past 12 Months: # of hospitalizations or ER visits: 0 # of surgeries: 0  Review of Systems    Patient reports that his overall health is unchanged compared to last year.  History obtained from chart review and the patient  Patient Reported Readings (BP,  Pulse, CBG, Weight, etc) none  Pain Assessment Pain : No/denies pain     Current Medications & Allergies (verified) Allergies as of 11/30/2020   No Known Allergies     Medication List       Accurate as of November 30, 2020  4:32 PM. If you have any questions, ask your nurse or doctor.        STOP taking these medications   polyethylene glycol-electrolytes 420 g solution Commonly known as: NuLYTELY     TAKE these medications   amLODipine 10 MG tablet Commonly known as: NORVASC Take 1 tablet (10 mg total) by mouth daily.   escitalopram 10 MG tablet Commonly known as: LEXAPRO Take 1 tablet (10 mg total) by mouth daily.   LORazepam 0.5 MG tablet Commonly known as: ATIVAN TAKE 1 TABLET (0.5 MG TOTAL) BY MOUTH EVERY 8 (EIGHT) HOURS. What changed:   when to take this  reasons to take this   losartan-hydrochlorothiazide 100-25 MG tablet Commonly known as: HYZAAR Take 1 tablet by mouth daily.   metoprolol succinate 50 MG 24 hr tablet Commonly known as: TOPROL-XL Take 1 tablet (50 mg total) by mouth daily. Take with or immediately following a meal.   omega-3 acid ethyl esters 1 g capsule Commonly known as: LOVAZA TAKE 2 CAPSULES BY MOUTH 2 TIMES DAILY.       History (reviewed): Past Medical History:  Diagnosis Date  . Anxiety   . Depression   . 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 Hyperlipidemia 02/01/2014  . Hypertension   . Leg DVT (deep venous thromboembolism), acute, bilateral ([place]) 04/18/2019  . Port-A-Cath in place 04/27/2019  Past Surgical History:  Procedure Laterality Date  . CYSTOSCOPY W/ URETERAL STENT PLACEMENT Bilateral 04/21/2019   Procedure: CYSTOSCOPY WITH RETROGRADE PYELOGRAM/URETERAL STENT PLACEMENT;  Surgeon: Cleon Gustin, MD;  Location: [facility];  Service: Urology;  Laterality: Bilateral;  . ETHMOIDECTOMY Right 02/04/2020   Procedure: ETHMOIDECTOMY;  Surgeon: Leta Baptist, MD;  Location: [facility];  Service: ENT;   Laterality: Right;  . INGUINAL LYMPH NODE BIOPSY Right 04/21/2019   Procedure: INGUINAL LYMPH NODE BIOPSY;  Surgeon: Virl Cagey, MD;  Location: [facility];  Service: General;  Laterality: Right;  . MAXILLARY ANTROSTOMY Right 02/04/2020   Procedure: MAXILLARY ANTROSTOMY WITH TISSUE REMOVAL;  Surgeon: Leta Baptist, MD;  Location: [facility];  Service: ENT;  Laterality: Right;  . PORTACATH PLACEMENT Left 05/07/2019   Procedure: INSERTION PORT-A-CATH (catheter left subclavian);  Surgeon: Virl Cagey, MD;  Location: [facility];  Service: General;  Laterality: Left;   Family History  Problem Relation Age of Onset  . Diabetes Brother   . Diabetes Mother   . Leukemia Brother   . Diabetes Son   . Colon cancer Neg Hx    Social History   Socioeconomic History  . Marital status: Married    Spouse name: Not on file  . Number of children: 3  . Years of education: 10  . Highest education level: Not on file  Occupational History  . Not on file  Tobacco Use  . Smoking status: Former Smoker    Quit date: 04/27/1999    Years since quitting: 21.6  . Smokeless tobacco: Never Used  Vaping Use  . Vaping Use: Never used  Substance and Sexual Activity  . Alcohol use: Yes    Comment: daily. 2-6 beer daily  . Drug use: Never  . Sexual activity: Not on file  Other Topics Concern  . Not on file  Social History Narrative  . Not on file   Social Determinants of Health   Financial Resource Strain: Low Risk   . Difficulty of Paying Living Expenses: Not very hard  Food Insecurity: No Food Insecurity  . Worried About Charity fundraiser in the Last Year: Never true  . Ran Out of Food in the Last Year: Never true  Transportation Needs: No Transportation Needs  . Lack of Transportation (Medical): No  . Lack of Transportation (Non-Medical): No  Physical Activity: Inactive  . Days of Exercise per Week: 0 days  . Minutes of Exercise per Session: 0 min  Stress: No Stress Concern Present   . Feeling of Stress : Only a little  Social Connections: Moderately Integrated  . Frequency of Communication with Friends and Family: More than three times a week  . Frequency of Social Gatherings with Friends and Family: More than three times a week  . Attends Religious Services: 1 to 4 times per year  . Active Member of Clubs or Organizations: No  . Attends Archivist Meetings: Never  . Marital Status: Married    Activities of Daily Living In your present state of health, do you have any difficulty performing the following activities: 11/30/2020 02/04/2020  Hearing? Y N  Vision? N N  Difficulty concentrating or making decisions? N N  Walking or climbing stairs? N N  Dressing or bathing? N N  Doing errands, shopping? N -  Preparing Food and eating ? N -  Using the Toilet? N -  In the past six months, have you accidently leaked urine? N -  Do you have problems with loss of bowel control? N -  Managing your Medications? Y -  Comment daughter fixes pill box weekly -  Managing your Finances? Y -  Comment wife pays -  Housekeeping or managing your Housekeeping? N -  Some recent data might be hidden    Patient Education/ Literacy How often do you need to have someone help you when you read instructions, pamphlets, or other written materials from your doctor or pharmacy?: 2 - Rarely What is the last grade level you completed in school?: 10  Exercise Current Exercise Habits: The patient does not participate in regular exercise at present, Exercise limited by: None identified  Diet Patient reports consuming 3 meals a day and 1 snack(s) a day Patient reports that his primary diet is: Regular Patient reports that she does have regular access to food.   Depression Screen PHQ 2/9 Scores 11/30/2020 11/30/2020 11/08/2020 09/07/2020 06/08/2020 12/09/2019 06/09/2019  PHQ - 2 Score 0 0 0 0 0 0 0  PHQ- 9 Score - - 0 0 - - -     Fall Risk Fall Risk  11/30/2020 11/08/2020 09/07/2020 06/08/2020  01/25/2020  Falls in the past year? 0 0 0 0 0     Objective:  Kevin [name] seemed alert and oriented and he participated appropriately during our telephone visit.  Blood Pressure Weight BMI  BP Readings from Last 3 Encounters:  11/08/20 (!) 152/62  10/18/20 (!) 165/68  09/12/20 (!) 165/67   Wt Readings from Last 3 Encounters:  11/08/20 224 lb 9.6 oz (101.9 kg)  10/18/20 224 lb (101.6 kg)  09/07/20 223 lb 12.8 oz (101.5 kg)   BMI Readings from Last 1 Encounters:  11/08/20 36.25 kg/m    *Unable to obtain current vital signs, weight, and BMI due to telephone visit type  Hearing/Vision  . [place] did not seem to have difficulty with hearing/understanding during the telephone conversation . Reports that he has had a formal eye exam by an eye care professional within the past year . Reports that he has not had a formal hearing evaluation within the past year *Unable to fully assess hearing and vision during telephone visit type  Cognitive Function: No flowsheet data found. (Normal:0-7, Significant for Dysfunction: >8)  Normal Cognitive Function Screening: No: scored    Immunization & Health Maintenance Record Immunization History  Administered Date(s) Administered  . Fluad Quad(high Dose 65+) 06/08/2020  . Influenza,inj,Quad PF,6+ Mos 06/04/2016, 08/27/2017, 04/18/2019  . Influenza-Unspecified 07/24/2015  . Pneumococcal Conjugate-13 12/09/2019  . Pneumococcal Polysaccharide-23 02/07/2015  . Tdap 05/23/2010    Health Maintenance  Topic Date Due  . COVID-19 Vaccine (1) Never done  . COLONOSCOPY (Pts 45-55yrs Insurance coverage will need to be confirmed)  Never done  . Zoster Vaccines- Shingrix (1 of 2) Never done  . TETANUS/TDAP  09/07/2021 (Originally 05/23/2020)  . PNA vac Low Risk Adult (2 of 2 - PPSV23) 12/08/2020  . INFLUENZA VACCINE  01/29/2021  . Hepatitis C Screening  Completed  . HPV VACCINES  Aged Out       Assessment  This is a routine wellness  examination for Kevin [name].  Health Maintenance: Due or Overdue Health Maintenance Due  Topic Date Due  . COVID-19 Vaccine (1) Never done  . COLONOSCOPY (Pts 45-60yrs Insurance coverage will need to be confirmed)  Never done  . Zoster Vaccines- Shingrix (1 of 2) Never done    Kevin [name] does not need a referral for [employer]  Assistance: Care Management:   no Social Work:    no Prescription Assistance:  no Nutrition/Diabetes Education:  no   Plan:  Personalized Goals Goals Addressed            This Visit's Progress   . Cut out extra servings      . DIET - INCREASE WATER INTAKE        Personalized Health Maintenance & Screening Recommendations  shingles vaccine  Lung Cancer Screening Recommended: no (Low Dose CT Chest recommended if Age 57-80 years, 30 pack-year currently smoking OR have quit w/in past 15 years) Hepatitis C Screening recommended: no HIV Screening recommended: no  Advanced Directives: Written information was not prepared per patient's request.  Referrals & Orders No orders of the defined types were placed in this encounter.   Follow-up Plan . Follow-up with Loman Brooklyn, FNP as planned on 01/11/21. . Colonoscopy scheduled for 12/25/20. . Shingrix vaccine needed and pt is agreeable. . Pt is independent of all ADL's, he still works full time. . Pt does have some hearing difficulty. Explained need for hearing exam, will mention at next appt. . Wears glasses and sees eye Dr yearly. . Declined advanced directive information. . Pt declined AVS    I have personally reviewed and noted the following in the patient's chart:   . Medical and social history . Use of alcohol, tobacco or illicit drugs  . Current medications and supplements . Functional ability and status . Nutritional status . Physical activity . Advanced directives . List of other physicians . Hospitalizations, surgeries, and ER visits in previous 12  months . Vitals . Screenings to include cognitive, depression, and falls . Referrals and appointments  In addition, I have reviewed and discussed with Kevin [name] certain preventive protocols, quality metrics, and best practice recommendations. A written personalized care plan for preventive services as well as general preventive health recommendations is available and can be mailed to the patient at his request.      Rana Snare, LPN  03/03/8100

## 2020-12-07 ENCOUNTER — Other Ambulatory Visit: Payer: Self-pay

## 2020-12-07 ENCOUNTER — Inpatient Hospital Stay (HOSPITAL_COMMUNITY): Payer: Medicare Other | Attending: Hematology

## 2020-12-07 DIAGNOSIS — C8338 Diffuse large B-cell lymphoma, lymph nodes of multiple sites: Secondary | ICD-10-CM | POA: Diagnosis not present

## 2020-12-07 LAB — CBC WITH DIFFERENTIAL/PLATELET
Abs Immature Granulocytes: 0.07 10*3/uL (ref 0.00–0.07)
Basophils Absolute: 0 10*3/uL (ref 0.0–0.1)
Basophils Relative: 1 %
Eosinophils Absolute: 0.3 10*3/uL (ref 0.0–0.5)
Eosinophils Relative: 5 %
HCT: 37.6 % — ABNORMAL LOW (ref 39.0–52.0)
Hemoglobin: 12.8 g/dL — ABNORMAL LOW (ref 13.0–17.0)
Immature Granulocytes: 1 %
Lymphocytes Relative: 20 %
Lymphs Abs: 1.2 10*3/uL (ref 0.7–4.0)
MCH: 32.7 pg (ref 26.0–34.0)
MCHC: 34 g/dL (ref 30.0–36.0)
MCV: 96.2 fL (ref 80.0–100.0)
Monocytes Absolute: 0.7 10*3/uL (ref 0.1–1.0)
Monocytes Relative: 12 %
Neutro Abs: 3.7 10*3/uL (ref 1.7–7.7)
Neutrophils Relative %: 61 %
Platelets: 179 10*3/uL (ref 150–400)
RBC: 3.91 MIL/uL — ABNORMAL LOW (ref 4.22–5.81)
RDW: 12.5 % (ref 11.5–15.5)
WBC: 6.1 10*3/uL (ref 4.0–10.5)
nRBC: 0 % (ref 0.0–0.2)

## 2020-12-07 LAB — COMPREHENSIVE METABOLIC PANEL
ALT: 18 U/L (ref 0–44)
AST: 17 U/L (ref 15–41)
Albumin: 3.8 g/dL (ref 3.5–5.0)
Alkaline Phosphatase: 65 U/L (ref 38–126)
Anion gap: 7 (ref 5–15)
BUN: 23 mg/dL (ref 8–23)
CO2: 28 mmol/L (ref 22–32)
Calcium: 9.1 mg/dL (ref 8.9–10.3)
Chloride: 105 mmol/L (ref 98–111)
Creatinine, Ser: 0.92 mg/dL (ref 0.61–1.24)
GFR, Estimated: >60 mL/min (ref >60)
Glucose, Bld: 132 mg/dL — ABNORMAL HIGH (ref 70–99)
Potassium: 4.2 mmol/L (ref 3.5–5.1)
Sodium: 140 mmol/L (ref 135–145)
Total Bilirubin: 0.7 mg/dL (ref 0.3–1.2)
Total Protein: 6.2 g/dL — ABNORMAL LOW (ref 6.5–8.1)

## 2020-12-07 LAB — LACTATE DEHYDROGENASE: LDH: 128 U/L (ref 98–192)

## 2020-12-07 NOTE — Patient Instructions (Signed)
[facility]  Discharge Instructions: Thank you for choosing [facility] to provide your oncology and hematology care.  If you have a lab appointment with the Cancer Center, please come in thru the Main Entrance and check in at the main information desk.  Wear comfortable clothing and clothing appropriate for easy access to any Portacath or PICC line.   We strive to give you quality time with your provider. You may need to reschedule your appointment if you arrive late (15 or more minutes).  Arriving late affects you and other patients whose appointments are after yours.  Also, if you miss three or more appointments without notifying the office, you may be dismissed from the clinic at the provider's discretion.      For prescription refill requests, have your pharmacy contact our office and allow 72 hours for refills to be completed.        To help prevent nausea and vomiting after your treatment, we encourage you to take your nausea medication as directed.  BELOW ARE SYMPTOMS THAT SHOULD BE REPORTED IMMEDIATELY: *FEVER GREATER THAN 100.4 F (38 C) OR HIGHER *CHILLS OR SWEATING *NAUSEA AND VOMITING THAT IS NOT CONTROLLED WITH YOUR NAUSEA MEDICATION *UNUSUAL SHORTNESS OF BREATH *UNUSUAL BRUISING OR BLEEDING *URINARY PROBLEMS (pain or burning when urinating, or frequent urination) *BOWEL PROBLEMS (unusual diarrhea, constipation, pain near the anus) TENDERNESS IN MOUTH AND THROAT WITH OR WITHOUT PRESENCE OF ULCERS (sore throat, sores in mouth, or a toothache) UNUSUAL RASH, SWELLING OR PAIN  UNUSUAL VAGINAL DISCHARGE OR ITCHING   Items with * indicate a potential emergency and should be followed up as soon as possible or go to the Emergency Department if any problems should occur.  Please show the CHEMOTHERAPY ALERT CARD or IMMUNOTHERAPY ALERT CARD at check-in to the Emergency Department and triage nurse.  Should you have questions after your visit or need to cancel  or reschedule your appointment, please contact [facility] [phone]  and follow the prompts.  Office hours are 8:00 a.m. to 4:30 p.m. Monday - Friday. Please note that voicemails left after 4:00 p.m. may not be returned until the following business day.  We are closed weekends and major holidays. You have access to a nurse at all times for urgent questions. Please call the main number to the clinic [phone] and follow the prompts.  For any non-urgent questions, you may also contact your provider using MyChart. We now offer e-Visits for anyone 18 and older to request care online for non-urgent symptoms. For details visit [email or web address].   Also download the MyChart app! Go to the app store, search "MyChart", open the app, select [facility], and log in with your MyChart username and password.  Due to Covid, a mask is required upon entering the hospital/clinic. If you do not have a mask, one will be given to you upon arrival. For doctor visits, patients may have 1 support person aged 18 or older with them. For treatment visits, patients cannot have anyone with them due to current Covid guidelines and our immunocompromised population.  

## 2020-12-07 NOTE — Progress Notes (Signed)
Patients port flushed without difficulty.  Good blood return noted with no bruising or swelling noted at site.  Stable during access and blood draw.  Band aid applied.  VSS with discharge and left in satisfactory condition with no s/s of distress noted.   ?

## 2020-12-12 ENCOUNTER — Other Ambulatory Visit: Payer: Self-pay | Admitting: Family Medicine

## 2020-12-12 DIAGNOSIS — I10 Essential (primary) hypertension: Secondary | ICD-10-CM

## 2020-12-13 ENCOUNTER — Encounter (HOSPITAL_COMMUNITY)
Admission: RE | Admit: 2020-12-13 | Discharge: 2020-12-13 | Disposition: A | Discharge status: Home / Self Care | Payer: Medicare Other | Source: Ambulatory Visit | Attending: Hematology | Admitting: Hematology

## 2020-12-13 ENCOUNTER — Other Ambulatory Visit: Payer: Self-pay

## 2020-12-13 DIAGNOSIS — C8330 Diffuse large B-cell lymphoma, unspecified site: Secondary | ICD-10-CM | POA: Diagnosis not present

## 2020-12-13 DIAGNOSIS — I700 Atherosclerosis of aorta: Secondary | ICD-10-CM | POA: Insufficient documentation

## 2020-12-13 DIAGNOSIS — I2510 Atherosclerotic heart disease of native coronary artery without angina pectoris: Secondary | ICD-10-CM | POA: Insufficient documentation

## 2020-12-13 DIAGNOSIS — C8338 Diffuse large B-cell lymphoma, lymph nodes of multiple sites: Secondary | ICD-10-CM | POA: Diagnosis not present

## 2020-12-13 LAB — GLUCOSE, CAPILLARY: Glucose-Capillary: 111 mg/dL — ABNORMAL HIGH (ref 70–99)

## 2020-12-13 MED ORDER — FLUDEOXYGLUCOSE F - 18 (FDG) INJECTION
11.6000 | Freq: Once | INTRAVENOUS | Status: AC
  Administered 2020-12-13: 11.1 via INTRAVENOUS

## 2020-12-18 ENCOUNTER — Ambulatory Visit (HOSPITAL_COMMUNITY): Payer: Medicare Other | Admitting: Hematology

## 2020-12-18 NOTE — Patient Instructions (Signed)
Kevin [name]  12/18/2020     @PREFPERIOPPHARMACY @   Your procedure is scheduled on  12/25/2020.   Report to [facility] at 1200  P.M.   Call this number if you have problems the morning of surgery:  [phone]   Remember:  Follow the diet and prep instructions given to you by the office.    Take these medicines the morning of surgery with A SIP OF WATER    amlodipine, lexapro, ativan (if needed), metoprolol.     Please brush your teeth.  Do not wear jewelry, make-up or nail polish.  Do not wear lotions, powders, or perfumes, or deodorant.  Do not shave 48 hours prior to surgery.  Men may shave face and neck.  Do not bring valuables to the hospital.  [facility] is not responsible for any belongings or valuables.  Contacts, dentures or bridgework may not be worn into surgery.  Leave your suitcase in the car.  After surgery it may be brought to your room.  For patients admitted to the hospital, discharge time will be determined by your treatment team.  Patients discharged the day of surgery will not be allowed to drive home and must have someone with them for 24 hours.Marland Kitchen    Special instructions:   DO NOT smoke tobacco or vape for 24 hours before your procedure.  Please read over the following fact sheets that you were given. Anesthesia Post-op Instructions and Care and Recovery After Surgery      Colonoscopy, Adult, Care After This sheet gives you information about how to care for yourself after your procedure. Your health care provider may also give you more specific instructions. If you have problems or questions, contact your health careprovider. What can I expect after the procedure? After the procedure, it is common to have: A small amount of blood in your stool for 24 hours after the procedure. Some gas. Mild cramping or bloating of your abdomen. Follow these instructions at home: Eating and drinking  Drink enough fluid to keep your urine pale  yellow. Follow instructions from your health care provider about eating or drinking restrictions. Resume your normal diet as instructed by your health care provider. Avoid heavy or fried foods that are hard to digest.  Activity Rest as told by your health care provider. Avoid sitting for a long time without moving. Get up to take short walks every 1-2 hours. This is important to improve blood flow and breathing. Ask for help if you feel weak or unsteady. Return to your normal activities as told by your health care provider. Ask your health care provider what activities are safe for you. Managing cramping and bloating  Try walking around when you have cramps or feel bloated. Apply heat to your abdomen as told by your health care provider. Use the heat source that your health care provider recommends, such as a moist heat pack or a heating pad. Place a towel between your skin and the heat source. Leave the heat on for 20-30 minutes. Remove the heat if your skin turns bright red. This is especially important if you are unable to feel pain, heat, or cold. You may have a greater risk of getting burned.  General instructions If you were given a sedative during the procedure, it can affect you for several hours. Do not drive or operate machinery until your health care provider says that it is safe. For the first 24 hours after the procedure:  Do not sign important documents. Do not drink alcohol. Do your regular daily activities at a slower pace than normal. Eat soft foods that are easy to digest. Take over-the-counter and prescription medicines only as told by your health care provider. Keep all follow-up visits as told by your health care provider. This is important. Contact a health care provider if: You have blood in your stool 2-3 days after the procedure. Get help right away if you have: More than a small spotting of blood in your stool. Large blood clots in your stool. Swelling of your  abdomen. Nausea or vomiting. A fever. Increasing pain in your abdomen that is not relieved with medicine. Summary After the procedure, it is common to have a small amount of blood in your stool. You may also have mild cramping and bloating of your abdomen. If you were given a sedative during the procedure, it can affect you for several hours. Do not drive or operate machinery until your health care provider says that it is safe. Get help right away if you have a lot of blood in your stool, nausea or vomiting, a fever, or increased pain in your abdomen. This information is not intended to replace advice given to you by your health care provider. Make sure you discuss any questions you have with your healthcare provider. Document Revised: 06/11/2019 Document Reviewed: 01/11/2019 Elsevier Patient Education  [place] After This sheet gives you information about how to care for yourself after your procedure. Your health care provider may also give you more specific instructions. If you have problems or questions, contact your health careprovider. What can I expect after the procedure? After the procedure, it is common to have: Tiredness. Forgetfulness about what happened after the procedure. Impaired judgment for important decisions. Nausea or vomiting. Some difficulty with balance. Follow these instructions at home: For the time period you were told by your health care provider:     Rest as needed. Do not participate in activities where you could fall or become injured. Do not drive or use machinery. Do not drink alcohol. Do not take sleeping pills or medicines that cause drowsiness. Do not make important decisions or sign legal documents. Do not take care of children on your own. Eating and drinking Follow the diet that is recommended by your health care provider. Drink enough fluid to keep your urine pale yellow. If you vomit: Drink water,  juice, or soup when you can drink without vomiting. Make sure you have little or no nausea before eating solid foods. General instructions Have a responsible adult stay with you for the time you are told. It is important to have someone help care for you until you are awake and alert. Take over-the-counter and prescription medicines only as told by your health care provider. If you have sleep apnea, surgery and certain medicines can increase your risk for breathing problems. Follow instructions from your health care provider about wearing your sleep device: Anytime you are sleeping, including during daytime naps. While taking prescription pain medicines, sleeping medicines, or medicines that make you drowsy. Avoid smoking. Keep all follow-up visits as told by your health care provider. This is important. Contact a health care provider if: You keep feeling nauseous or you keep vomiting. You feel light-headed. You are still sleepy or having trouble with balance after 24 hours. You develop a rash. You have a fever. You have redness or swelling around the IV site. Get help right away  if: You have trouble breathing. You have new-onset confusion at home. Summary For several hours after your procedure, you may feel tired. You may also be forgetful and have poor judgment. Have a responsible adult stay with you for the time you are told. It is important to have someone help care for you until you are awake and alert. Rest as told. Do not drive or operate machinery. Do not drink alcohol or take sleeping pills. Get help right away if you have trouble breathing, or if you suddenly become confused. This information is not intended to replace advice given to you by your health care provider. Make sure you discuss any questions you have with your healthcare provider. Document Revised: 03/02/2020 Document Reviewed: 05/20/2019 Elsevier Patient Education  2022 [employer].

## 2020-12-21 ENCOUNTER — Encounter (HOSPITAL_COMMUNITY): Payer: Self-pay

## 2020-12-21 ENCOUNTER — Other Ambulatory Visit: Payer: Self-pay

## 2020-12-21 ENCOUNTER — Encounter (HOSPITAL_COMMUNITY)
Admission: RE | Admit: 2020-12-21 | Discharge: 2020-12-21 | Disposition: A | Discharge status: Home / Self Care | Payer: Medicare Other | Source: Ambulatory Visit | Attending: Internal Medicine | Admitting: Internal Medicine

## 2020-12-21 ENCOUNTER — Other Ambulatory Visit (HOSPITAL_COMMUNITY): Payer: Medicare Other | Attending: Internal Medicine

## 2020-12-21 DIAGNOSIS — Z01810 Encounter for preprocedural cardiovascular examination: Secondary | ICD-10-CM | POA: Diagnosis not present

## 2020-12-21 NOTE — Progress Notes (Signed)
   12/21/20 1431  OBSTRUCTIVE SLEEP APNEA  Have you ever been diagnosed with sleep apnea through a sleep study? No  Do you snore loudly (loud enough to be heard through closed doors)?  1  Do you often feel tired, fatigued, or sleepy during the daytime (such as falling asleep during driving or talking to someone)? 0  Has anyone observed you stop breathing during your sleep? 0  Do you have, or are you being treated for high blood pressure? 1  BMI more than 35 kg/m2? 1  Age > 50 (1-yes) 1  Neck circumference greater than:Male 16 inches or larger, Male 17inches or larger? 0  Male Gender (Yes=1) 1  Obstructive Sleep Apnea Score 5  Score 5 or greater  Results sent to PCP

## 2020-12-25 ENCOUNTER — Encounter (HOSPITAL_COMMUNITY)
Admission: RE | Disposition: A | Discharge status: Home / Self Care | Payer: Self-pay | Source: Home / Self Care | Attending: Internal Medicine

## 2020-12-25 ENCOUNTER — Ambulatory Visit (HOSPITAL_COMMUNITY): Payer: Medicare Other | Admitting: Anesthesiology

## 2020-12-25 ENCOUNTER — Ambulatory Visit (HOSPITAL_COMMUNITY)
Admission: RE | Admit: 2020-12-25 | Discharge: 2020-12-25 | Disposition: A | Discharge status: Home / Self Care | Payer: Medicare Other | Attending: Internal Medicine | Admitting: Internal Medicine

## 2020-12-25 DIAGNOSIS — Z7901 Long term (current) use of anticoagulants: Secondary | ICD-10-CM | POA: Insufficient documentation

## 2020-12-25 DIAGNOSIS — K640 First degree hemorrhoids: Secondary | ICD-10-CM | POA: Diagnosis not present

## 2020-12-25 DIAGNOSIS — Z86718 Personal history of other venous thrombosis and embolism: Secondary | ICD-10-CM | POA: Insufficient documentation

## 2020-12-25 DIAGNOSIS — Z1211 Encounter for screening for malignant neoplasm of colon: Secondary | ICD-10-CM | POA: Diagnosis not present

## 2020-12-25 DIAGNOSIS — Z833 Family history of diabetes mellitus: Secondary | ICD-10-CM | POA: Diagnosis not present

## 2020-12-25 DIAGNOSIS — Z7951 Long term (current) use of inhaled steroids: Secondary | ICD-10-CM | POA: Diagnosis not present

## 2020-12-25 DIAGNOSIS — Z87891 Personal history of nicotine dependence: Secondary | ICD-10-CM | POA: Diagnosis not present

## 2020-12-25 DIAGNOSIS — Z8572 Personal history of non-Hodgkin lymphomas: Secondary | ICD-10-CM | POA: Diagnosis not present

## 2020-12-25 DIAGNOSIS — I272 Pulmonary hypertension, unspecified: Secondary | ICD-10-CM | POA: Diagnosis not present

## 2020-12-25 DIAGNOSIS — Z7982 Long term (current) use of aspirin: Secondary | ICD-10-CM | POA: Diagnosis not present

## 2020-12-25 DIAGNOSIS — Z79899 Other long term (current) drug therapy: Secondary | ICD-10-CM | POA: Diagnosis not present

## 2020-12-25 HISTORY — PX: COLONOSCOPY WITH PROPOFOL: SHX5780

## 2020-12-25 SURGERY — COLONOSCOPY WITH PROPOFOL
Anesthesia: General

## 2020-12-25 MED ORDER — PROPOFOL 500 MG/50ML IV EMUL
INTRAVENOUS | Status: DC | PRN
  Administered 2020-12-25: 125 ug/kg/min via INTRAVENOUS

## 2020-12-25 MED ORDER — PROPOFOL 10 MG/ML IV BOLUS
INTRAVENOUS | Status: DC | PRN
  Administered 2020-12-25: 30 mg via INTRAVENOUS
  Administered 2020-12-25: 200 mg via INTRAVENOUS
  Administered 2020-12-25: 20 mg via INTRAVENOUS

## 2020-12-25 MED ORDER — LIDOCAINE HCL (CARDIAC) PF 50 MG/5ML IV SOSY
PREFILLED_SYRINGE | INTRAVENOUS | Status: DC | PRN
  Administered 2020-12-25: 50 mg via INTRAVENOUS

## 2020-12-25 MED ORDER — STERILE WATER FOR IRRIGATION IR SOLN: Status: DC | PRN

## 2020-12-25 MED ORDER — LACTATED RINGERS IV SOLN: INTRAVENOUS | Status: DC

## 2020-12-25 NOTE — Anesthesia Postprocedure Evaluation (Signed)
Anesthesia Post Note  Patient: Kevin [name]  Procedure(s) Performed: COLONOSCOPY WITH PROPOFOL  Patient location during evaluation: Endoscopy Anesthesia Type: General Level of consciousness: awake and alert and oriented Pain management: pain level controlled Vital Signs Assessment: post-procedure vital signs reviewed and stable Respiratory status: spontaneous breathing and respiratory function stable Cardiovascular status: blood pressure returned to baseline and stable Postop Assessment: no apparent nausea or vomiting Anesthetic complications: no   No notable events documented.   Last Vitals:  Vitals:   12/25/20 0722 12/25/20 0837  BP: (!) 169/55 120/66  Pulse:  61  Resp: 18   Temp: 37 C 37 C  SpO2: 97% 97%    Last Pain:  Vitals:   12/25/20 0837  TempSrc: Oral  PainSc: 0-No pain                 Kodey Xue C Juaquina Machnik

## 2020-12-25 NOTE — Transfer of Care (Signed)
Immediate Anesthesia Transfer of Care Note  Patient: Kevin [name]  Procedure(s) Performed: COLONOSCOPY WITH PROPOFOL  Patient Location: Short Stay  Anesthesia Type:General  Level of Consciousness: awake, alert  and patient cooperative  Airway & Oxygen Therapy: Patient Spontanous Breathing  Post-op Assessment: Report given to RN and Post -op Vital signs reviewed and stable  Post vital signs: Reviewed and stable  Last Vitals:  Vitals Value Taken Time  BP    Temp    Pulse    Resp    SpO2      Last Pain:  Vitals:   12/25/20 0722  TempSrc: Oral  PainSc: 0-No pain      Patients Stated Pain Goal: 8 (30/14/99 6924)  Complications: No notable events documented.

## 2020-12-25 NOTE — H&P (Signed)
@LOGO @   Primary Care Physician:  Loman Brooklyn, FNP Primary Gastroenterologist:  Dr. Gala Romney  Pre-Procedure History & Physical: HPI:  Kevin [name] is a 65 y.o. male is here for a screening colonoscopy.  First ever average risk screening examination.  No bowel symptoms.  No family history of colon cancer.  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   Hyperlipidemia 02/01/2014   Hypertension    Leg DVT (deep venous thromboembolism), acute, bilateral ([place]) 04/18/2019   Port-A-Cath in place 04/27/2019    Past Surgical History:  Procedure Laterality Date   CYSTOSCOPY W/ URETERAL STENT PLACEMENT Bilateral 04/21/2019   Procedure: CYSTOSCOPY WITH RETROGRADE PYELOGRAM/URETERAL STENT PLACEMENT;  Surgeon: Cleon Gustin, MD;  Location: [facility];  Service: Urology;  Laterality: Bilateral;   ETHMOIDECTOMY Right 02/04/2020   Procedure: ETHMOIDECTOMY;  Surgeon: Leta Baptist, MD;  Location: [facility];  Service: ENT;  Laterality: Right;   INGUINAL LYMPH NODE BIOPSY Right 04/21/2019   Procedure: INGUINAL LYMPH NODE BIOPSY;  Surgeon: Virl Cagey, MD;  Location: [facility];  Service: General;  Laterality: Right;   MAXILLARY ANTROSTOMY Right 02/04/2020   Procedure: MAXILLARY ANTROSTOMY WITH TISSUE REMOVAL;  Surgeon: Leta Baptist, MD;  Location: [facility];  Service: ENT;  Laterality: Right;   PORTACATH PLACEMENT Left 05/07/2019   Procedure: INSERTION PORT-A-CATH (catheter left subclavian);  Surgeon: Virl Cagey, MD;  Location: [facility];  Service: General;  Laterality: Left;    Prior to Admission medications   Medication Sig Start Date End Date Taking? Authorizing Provider  amLODipine (NORVASC) 10 MG tablet Take 1 tablet (10 mg total) by mouth daily. 09/07/20  Yes Hendricks Limes F, FNP  aspirin EC 81 MG tablet Take 81 mg by mouth daily. Swallow whole.   Yes [provider]  escitalopram  (LEXAPRO) 10 MG tablet Take 1 tablet (10 mg total) by mouth daily. 09/07/20  Yes Hendricks Limes F, FNP  lidocaine-prilocaine (EMLA) cream Apply 1 application topically as needed (port access).   Yes [provider]  LORazepam (ATIVAN) 0.5 MG tablet TAKE 1 TABLET (0.5 MG TOTAL) BY MOUTH EVERY 8 (EIGHT) HOURS. Patient taking differently: Take 0.5 mg by mouth every 8 (eight) hours as needed for anxiety. 03/13/20  Yes Derek Jack, MD  losartan-hydrochlorothiazide (HYZAAR) 100-25 MG tablet Take 1 tablet by mouth daily. 11/08/20  Yes Hendricks Limes F, FNP  metoprolol succinate (TOPROL-XL) 50 MG 24 hr tablet Take 1 tablet (50 mg total) by mouth daily. Take with or immediately following a meal. 09/07/20  Yes Hendricks Limes F, FNP  omega-3 acid ethyl esters (LOVAZA) 1 g capsule TAKE 2 CAPSULES BY MOUTH 2 TIMES DAILY. Patient taking differently: Take 2 g by mouth 2 (two) times daily. 10/27/20  Yes Loman Brooklyn, FNP  albuterol (VENTOLIN HFA) 108 (90 Base) MCG/ACT inhaler Inhale 2 puffs into the lungs every 6 (six) hours as needed for wheezing or shortness of breath.    [provider]    Allergies as of 10/18/2020   (No Known Allergies)    Family History  Problem Relation Age of Onset   Diabetes Brother    Diabetes Mother    Leukemia Brother    Diabetes Son    Colon cancer Neg Hx     Social History   Socioeconomic History   Marital status: Married    Spouse name: Not on file  Number of children: 3   Years of education: 10   Highest education level: Not on file  Occupational History   Not on file  Tobacco Use   Smoking status: Former    Packs/day: 3.00    Years: 30.00    Pack years: 90.00    Types: Cigarettes    Quit date: 04/27/1999    Years since quitting: 21.6   Smokeless tobacco: Never  Vaping Use   Vaping Use: Never used  Substance and Sexual Activity   Alcohol use: Yes    Comment: daily. 2-6 beer daily   Drug use: Never   Sexual activity: Yes   Other Topics Concern   Not on file  Social History Narrative   Not on file   Social Determinants of Health   Financial Resource Strain: Low Risk    Difficulty of Paying Living Expenses: Not very hard  Food Insecurity: No Food Insecurity   Worried About Running Out of Food in the Last Year: Never true   Ran Out of Food in the Last Year: Never true  Transportation Needs: No Transportation Needs   Lack of Transportation (Medical): No   Lack of Transportation (Non-Medical): No  Physical Activity: Inactive   Days of Exercise per Week: 0 days   Minutes of Exercise per Session: 0 min  Stress: No Stress Concern Present   Feeling of Stress : Only a little  Social Connections: Moderately Integrated   Frequency of Communication with Friends and Family: More than three times a week   Frequency of Social Gatherings with Friends and Family: More than three times a week   Attends Religious Services: 1 to 4 times per year   Active Member of [employer] or Organizations: No   Attends Archivist Meetings: Never   Marital Status: Married  Human resources officer Violence: Not At Risk   Fear of Current or Ex-Partner: No   Emotionally Abused: No   Physically Abused: No   Sexually Abused: No    Review of Systems: See HPI, otherwise negative ROS  Physical Exam: BP (!) 169/55   Temp 98.6 F (37 C) (Oral)   Resp 18   SpO2 97%  General:   Alert,  Well-developed, well-nourished, pleasant and cooperative in NAD Lungs:  Clear throughout to auscultation.   No wheezes, crackles, or rhonchi. No acute distress. Heart:  Regular rate and rhythm; no murmurs, clicks, rubs,  or gallops. Abdomen:  Soft, nontender and nondistended. No masses, hepatosplenomegaly or hernias noted. Normal bowel sounds, without guarding, and without rebound.    Impression/Plan: Kevin [name] is now here to undergo a screening colonoscopy.  First ever average risk screening examination  Risks, benefits, limitations,  imponderables and alternatives regarding colonoscopy have been reviewed with the patient. Questions have been answered. All parties agreeable.     Notice:  This dictation was prepared with Dragon dictation along with smaller phrase technology. Any transcriptional errors that result from this process are unintentional and may not be corrected upon review.

## 2020-12-25 NOTE — Discharge Instructions (Signed)
  Colonoscopy Discharge Instructions  Read the instructions outlined below and refer to this sheet in the next few weeks. These discharge instructions provide you with general information on caring for yourself after you leave the hospital. Your doctor may also give you specific instructions. While your treatment has been planned according to the most current medical practices available, unavoidable complications occasionally occur. If you have any problems or questions after discharge, call Dr. Gala Romney at [phone]. ACTIVITY You may resume your regular activity, but move at a slower pace for the next 24 hours.  Take frequent rest periods for the next 24 hours.  Walking will help get rid of the air and reduce the bloated feeling in your belly (abdomen).  No driving for 24 hours (because of the medicine (anesthesia) used during the test).   Do not sign any important legal documents or operate any machinery for 24 hours (because of the anesthesia used during the test).  NUTRITION Drink plenty of fluids.  You may resume your normal diet as instructed by your doctor.  Begin with a light meal and progress to your normal diet. Heavy or fried foods are harder to digest and may make you feel sick to your stomach (nauseated).  Avoid alcoholic beverages for 24 hours or as instructed.  MEDICATIONS You may resume your normal medications unless your doctor tells you otherwise.  WHAT YOU CAN EXPECT TODAY Some feelings of bloating in the abdomen.  Passage of more gas than usual.  Spotting of blood in your stool or on the toilet paper.  IF YOU HAD POLYPS REMOVED DURING THE COLONOSCOPY: No aspirin products for 7 days or as instructed.  No alcohol for 7 days or as instructed.  Eat a soft diet for the next 24 hours.  FINDING OUT THE RESULTS OF YOUR TEST Not all test results are available during your visit. If your test results are not back during the visit, make an appointment with your caregiver to find out the  results. Do not assume everything is normal if you have not heard from your caregiver or the medical facility. It is important for you to follow up on all of your test results.  SEEK IMMEDIATE MEDICAL ATTENTION IF: You have more than a spotting of blood in your stool.  Your belly is swollen (abdominal distention).  You are nauseated or vomiting.  You have a temperature over 101.  You have abdominal pain or discomfort that is severe or gets worse throughout the day.     No polyps found today  Repeat colonoscopy in 10 years for screening purposes  -  At patient request, called Butch Penny and [phone] -left message on answering service

## 2020-12-25 NOTE — Anesthesia Preprocedure Evaluation (Addendum)
Anesthesia Evaluation  Patient identified by MRN, date of birth, ID band Patient awake    Reviewed: Allergy & Precautions, NPO status , Patient's Chart, lab work & pertinent test results, reviewed documented beta blocker date and time   History of Anesthesia Complications Negative for: history of anesthetic complications  Airway Mallampati: II  TM Distance: >3 FB Neck ROM: Full    Dental  (+) Dental Advisory Given, Missing, Poor Dentition, Loose,    Pulmonary former smoker, PE   Pulmonary exam normal breath sounds clear to auscultation       Cardiovascular Exercise Tolerance: Good hypertension, Pt. on medications and Pt. on home beta blockers + Peripheral Vascular Disease (SEVERE pulmonary HTN) and + DVT  Normal cardiovascular exam Rhythm:Regular Rate:Bradycardia  1. Left ventricular ejection fraction, by visual estimation, is 60 to  65%. The left ventricle has normal function. Normal left ventricular size.  There is mildly increased left ventricular hypertrophy.  2. Left ventricular diastolic Doppler parameters are indeterminate  pattern of LV diastolic filling.  3. Global right ventricle has normal systolic function.The right  ventricular size is normal. No increase in right ventricular wall  thickness.  4. Left atrial size was normal.  5. Right atrial size was normal.  6. Mild to moderate aortic valve annular calcification.  7. The mitral valve is grossly normal. Trace mitral valve regurgitation.  8. The tricuspid valve is grossly normal. Tricuspid valve regurgitation  is trivial.  9. The aortic valve was not well visualized Aortic valve regurgitation  was not visualized by color flow Doppler. Mild aortic valve sclerosis  without stenosis.  10. The pulmonic valve was not well visualized. Pulmonic valve  regurgitation is not visualized by color flow Doppler.  11. Severely elevated pulmonary artery systolic  pressure.  12. The tricuspid regurgitant velocity is 4.27 m/s, and with an assumed  right atrial pressure of 3 mmHg, the estimated right ventricular systolic  pressure is severely elevated at 75.9 mmHg.  13. The inferior vena cava is normal in size with greater than 50%  respiratory variability, suggesting right atrial pressure of 3 mmHg.    Neuro/Psych PSYCHIATRIC DISORDERS Anxiety Depression    GI/Hepatic negative GI ROS, Neg liver ROS,   Endo/Other  negative endocrine ROS  Renal/GU Renal disease (AKI)     Musculoskeletal negative musculoskeletal ROS (+)   Abdominal   Peds  Hematology  (+) Blood dyscrasia (lymphoma), anemia ,   Anesthesia Other Findings Severe pulmonary HTN  Reproductive/Obstetrics negative OB ROS                           Anesthesia Physical Anesthesia Plan  ASA: 3  Anesthesia Plan: General   Post-op Pain Management:    Induction: Intravenous  PONV Risk Score and Plan: Propofol infusion  Airway Management Planned: Nasal Cannula and Natural Airway  Additional Equipment:   Intra-op Plan:   Post-operative Plan:   Informed Consent: I have reviewed the patients History and Physical, chart, labs and discussed the procedure including the risks, benefits and alternatives for the proposed anesthesia with the patient or authorized representative who has indicated his/her understanding and acceptance.     Dental advisory given  Plan Discussed with: CRNA and Surgeon  Anesthesia Plan Comments:         Anesthesia Quick Evaluation

## 2020-12-25 NOTE — Anesthesia Procedure Notes (Signed)
Date/Time: 12/25/2020 8:10 AM Performed by: Vista Deck, CRNA Pre-anesthesia Checklist: Patient identified, Emergency Drugs available, Suction available, Timeout performed and Patient being monitored Patient Re-evaluated:Patient Re-evaluated prior to induction Oxygen Delivery Method: Nasal Cannula

## 2020-12-25 NOTE — Op Note (Signed)
[facility] Patient Name: Kevin [name] Procedure Date: 12/25/2020 7:57 AM MRN: [id number] Date of Birth: Jun 01, 1956 Attending MD: Norvel Richards , MD CSN: [id number] Age: 65 Admit Type: Outpatient Procedure:                Colonoscopy Indications:              Screening for colorectal malignant neoplasm Providers:                Norvel Richards, MD, Lurline Del, RN, Randa Spike, Technician Referring MD:              Medicines:                Propofol per Anesthesia Complications:            No immediate complications. Estimated Blood Loss:     Estimated blood loss: none. Estimated blood loss:                            none. Procedure:                Pre-Anesthesia Assessment:                           - Prior to the procedure, a History and Physical                            was performed, and patient medications and                            allergies were reviewed. The patient's tolerance of                            previous anesthesia was also reviewed. The risks                            and benefits of the procedure and the sedation                            options and risks were discussed with the patient.                            All questions were answered, and informed consent                            was obtained. Prior Anticoagulants: The patient has                            taken no previous anticoagulant or antiplatelet                            agents. ASA Grade Assessment: III - A patient with  severe systemic disease. After reviewing the risks                            and benefits, the patient was deemed in                            satisfactory condition to undergo the procedure.                           After obtaining informed consent, the colonoscope                            was passed under direct vision. Throughout the                            procedure, the patient's  blood pressure, pulse, and                            oxygen saturations were monitored continuously. The                            CF-[id number] ([id number]) scope was introduced through                            the anus and advanced to the the cecum, identified                            by appendiceal orifice and ileocecal valve. The                            colonoscopy was performed without difficulty. The                            patient tolerated the procedure well. The quality                            of the bowel preparation was adequate. Scope In: 8:17:41 AM Scope Out: 8:30:27 AM Scope Withdrawal Time: 0 hours 10 minutes 19 seconds  Total Procedure Duration: 0 hours 12 minutes 46 seconds  Findings:      The perianal and digital rectal examinations were normal.      Non-bleeding internal hemorrhoids were found during retroflexion. The       hemorrhoids were mild, small and Grade I (internal hemorrhoids that do       not prolapse). 1 cm yellowish submucosal nodule at the hepatic flexure.       Positive pillow sign.      The exam was otherwise without abnormality on direct and retroflexion       views. Impression:               - Non-bleeding internal hemorrhoids. Colonic lipoma                           - The examination was otherwise normal on direct  and retroflexion views.                           - No specimens collected. Moderate Sedation:      Moderate (conscious) sedation was personally administered by an       anesthesia professional. The following parameters were monitored: oxygen       saturation, heart rate, blood pressure, respiratory rate, EKG, adequacy       of pulmonary ventilation, and response to care. Recommendation:           - Patient has a contact number available for                            emergencies. The signs and symptoms of potential                            delayed complications were discussed with the                             patient. Return to normal activities tomorrow.                            Written discharge instructions were provided to the                            patient.                           - Resume previous diet.                           - Continue present medications.                           - Repeat colonoscopy in 10 years for screening                            purposes.                           - Return to GI office (date not yet determined). Procedure Code(s):        --- Professional ---                           [phone], Colonoscopy, flexible; diagnostic, including                            collection of specimen(s) by brushing or washing,                            when performed (separate procedure) Diagnosis Code(s):        --- Professional ---                           Z12.11, Encounter for screening for malignant  neoplasm of colon                           K64.0, First degree hemorrhoids CPT copyright 2019 [employer]. All rights reserved. The codes documented in this report are preliminary and upon coder review may  be revised to meet current compliance requirements. Cristopher Estimable. Naome Brigandi, MD Norvel Richards, MD 12/25/2020 8:37:53 AM This report has been signed electronically. Number of Addenda: 0

## 2020-12-26 ENCOUNTER — Ambulatory Visit (HOSPITAL_COMMUNITY): Payer: Medicare Other | Admitting: Hematology and Oncology

## 2020-12-26 VITALS — BP 140/59 | HR 50 | Temp 97.0°F | Resp 20 | Wt 224.4 lb

## 2020-12-26 DIAGNOSIS — C8330 Diffuse large B-cell lymphoma, unspecified site: Secondary | ICD-10-CM | POA: Diagnosis not present

## 2020-12-26 DIAGNOSIS — C8338 Diffuse large B-cell lymphoma, lymph nodes of multiple sites: Secondary | ICD-10-CM | POA: Diagnosis not present

## 2020-12-26 DIAGNOSIS — Z7982 Long term (current) use of aspirin: Secondary | ICD-10-CM | POA: Diagnosis not present

## 2020-12-26 DIAGNOSIS — Z7901 Long term (current) use of anticoagulants: Secondary | ICD-10-CM | POA: Diagnosis not present

## 2020-12-26 DIAGNOSIS — Z79899 Other long term (current) drug therapy: Secondary | ICD-10-CM | POA: Diagnosis not present

## 2020-12-26 DIAGNOSIS — K640 First degree hemorrhoids: Secondary | ICD-10-CM | POA: Diagnosis not present

## 2020-12-26 DIAGNOSIS — I82403 Acute embolism and thrombosis of unspecified deep veins of lower extremity, bilateral: Secondary | ICD-10-CM

## 2020-12-26 DIAGNOSIS — Z8572 Personal history of non-Hodgkin lymphomas: Secondary | ICD-10-CM | POA: Diagnosis not present

## 2020-12-26 DIAGNOSIS — Z833 Family history of diabetes mellitus: Secondary | ICD-10-CM | POA: Diagnosis not present

## 2020-12-26 DIAGNOSIS — Z86718 Personal history of other venous thrombosis and embolism: Secondary | ICD-10-CM | POA: Diagnosis not present

## 2020-12-26 DIAGNOSIS — Z87891 Personal history of nicotine dependence: Secondary | ICD-10-CM | POA: Diagnosis not present

## 2020-12-26 DIAGNOSIS — Z7951 Long term (current) use of inhaled steroids: Secondary | ICD-10-CM | POA: Diagnosis not present

## 2020-12-26 DIAGNOSIS — Z1211 Encounter for screening for malignant neoplasm of colon: Secondary | ICD-10-CM | POA: Diagnosis not present

## 2020-12-26 NOTE — Progress Notes (Signed)
Kevin [name], [place] [id number]   CLINIC:  Medical Oncology/Hematology  PCP:  Loman Brooklyn, [place] / [place] [address]  [phone]  REASON FOR VISIT:  Follow-up for high-grade DLBCL  PRIOR THERAPY: R-EPOCH x 6 cycles from 05/11/2019 to 08/23/2019  CURRENT THERAPY: Surveillance  INTERVAL HISTORY:  Kevin [name], a 65 y.o. male, returns for routine follow-up for his high-grade DLBCL. Kevin [name] was last seen on 09/06/2020.  On exam today [name] presents for routine follow-up visit.  He reports he has been well in the interim since his last visit in March 2022.  He reports no fevers, chills, sweats, nausea, vomiting or diarrhea.  He denies any lymphadenopathy.  Overall he has been at his baseline level of health other than reporting that he is "a little slower in the a.m.".  He does note that his appetite is good and has had no other major changes in his health.  His weight is remained stable.  A full 10 point ROS is listed below.   REVIEW OF SYSTEMS:  Review of Systems  Constitutional:  Positive for fatigue (75%). Negative for appetite change, chills, diaphoresis, fever and unexpected weight change.  Neurological:  Negative for numbness.  All other systems reviewed and are negative.  PAST MEDICAL/SURGICAL HISTORY:  Past Medical History:  Diagnosis Date   Anxiety    Depression    Diffuse large B cell lymphoma ([place]) 04/26/2019   Hyperlipidemia 02/01/2014   Hypertension    Leg DVT (deep venous thromboembolism), acute, bilateral ([place]) 04/18/2019   Port-A-Cath in place 04/27/2019   Past Surgical History:  Procedure Laterality Date   CYSTOSCOPY W/ URETERAL STENT PLACEMENT Bilateral 04/21/2019   Procedure: CYSTOSCOPY WITH RETROGRADE PYELOGRAM/URETERAL STENT PLACEMENT;  Surgeon: Cleon Gustin, MD;  [facility];  Service: Urology;  Laterality: Bilateral;   ETHMOIDECTOMY Right 02/04/2020   Procedure: ETHMOIDECTOMY;   Surgeon: Leta Baptist, MD;  Location: [place] [facility];  Service: ENT;  Laterality: Right;   INGUINAL LYMPH NODE BIOPSY Right 04/21/2019   Procedure: INGUINAL LYMPH NODE BIOPSY;  Surgeon: Virl Cagey, MD;  Location: [facility];  Service: General;  Laterality: Right;   MAXILLARY ANTROSTOMY Right 02/04/2020   Procedure: MAXILLARY ANTROSTOMY WITH TISSUE REMOVAL;  Surgeon: Leta Baptist, MD;  Location: [facility];  Service: ENT;  Laterality: Right;   PORTACATH PLACEMENT Left 05/07/2019   Procedure: INSERTION PORT-A-CATH (catheter left subclavian);  Surgeon: Virl Cagey, MD;  Location: [facility];  Service: General;  Laterality: Left;    SOCIAL HISTORY:  Social History   Socioeconomic History   Marital status: Married    Spouse name: Not on file   Number of children: 3   Years of education: 10   Highest education level: Not on file  Occupational History   Not on file  Tobacco Use   Smoking status: Former    Packs/day: 3.00    Years: 30.00    Pack years: 90.00    Types: Cigarettes    Quit date: 04/27/1999    Years since quitting: 21.6   Smokeless tobacco: Never  Vaping Use   Vaping Use: Never used  Substance and Sexual Activity   Alcohol use: Yes    Comment: daily. 2-6 beer daily   Drug use: Never   Sexual activity: Yes  Other Topics Concern   Not on file  Social History Narrative   Not on file   Social  Determinants of Health   Financial Resource Strain: Low Risk    Difficulty of Paying Living Expenses: Not very hard  Food Insecurity: No Food Insecurity   Worried About Running Out of Food in the Last Year: Never true   Ran Out of Food in the Last Year: Never true  Transportation Needs: No Transportation Needs   Lack of Transportation (Medical): No   Lack of Transportation (Non-Medical): No  Physical Activity: Inactive   Days of Exercise per Week: 0 days   Minutes of Exercise per Session: 0 min  Stress: No Stress Concern Present   Feeling of Stress :  Only a little  Social Connections: Moderately Integrated   Frequency of Communication with Friends and Family: More than three times a week   Frequency of Social Gatherings with Friends and Family: More than three times a week   Attends Religious Services: 1 to 4 times per year   Active Member of [employer] or Organizations: No   Attends Music therapist: Never   Marital Status: Married  Human resources officer Violence: Not At Risk   Fear of Current or Ex-Partner: No   Emotionally Abused: No   Physically Abused: No   Sexually Abused: No    FAMILY HISTORY:  Family History  Problem Relation Age of Onset   Diabetes Brother    Diabetes Mother    Leukemia Brother    Diabetes Son    Colon cancer Neg Hx     CURRENT MEDICATIONS:  No current facility-administered medications for this visit.   No current outpatient medications on file.   Facility-Administered Medications Ordered in Other Visits  Medication Dose Route Frequency Provider Last Rate Last Admin   0.9 %  sodium chloride infusion   Intravenous Continuous Derek Jack, MD 20 mL/hr at 08/26/19 1105 New Bag at 08/26/19 1105   lactated ringers infusion   Intravenous Continuous Battula, Rajamani C, MD   Stopped at 12/25/20 0839   sodium chloride flush (NS) 0.9 % injection 10 mL  10 mL Intravenous PRN Derek Jack, MD   10 mL at 08/26/19 1104    ALLERGIES:  No Known Allergies  PHYSICAL EXAM:  Performance status (ECOG): 1 - Symptomatic but completely ambulatory  There were no vitals filed for this visit.  Wt Readings from Last 3 Encounters:  12/21/20 224 lb (101.6 kg)  11/08/20 224 lb 9.6 oz (101.9 kg)  10/18/20 224 lb (101.6 kg)   Physical Exam Vitals reviewed.  Constitutional:      Appearance: Normal appearance. He is obese.  Cardiovascular:     Rate and Rhythm: Normal rate and regular rhythm.     Pulses: Normal pulses.     Heart sounds: Normal heart sounds.  Pulmonary:     Effort: Pulmonary  effort is normal.     Breath sounds: Normal breath sounds.  Chest:  Breasts:    Right: No axillary adenopathy or supraclavicular adenopathy.     Left: No axillary adenopathy or supraclavicular adenopathy.  Abdominal:     Palpations: Abdomen is soft. There is no hepatomegaly, splenomegaly or mass.     Tenderness: There is no abdominal tenderness.     Hernia: No hernia is present.  Musculoskeletal:     Right lower leg: No edema.     Left lower leg: No edema.  Lymphadenopathy:     Cervical: No cervical adenopathy.     Upper Body:     Right upper body: No supraclavicular, axillary or pectoral adenopathy.  Left upper body: No supraclavicular, axillary or pectoral adenopathy.     Lower Body: No right inguinal adenopathy. No left inguinal adenopathy.  Neurological:     General: No focal deficit present.     Mental Status: He is alert and oriented to person, place, and time.  Psychiatric:        Mood and Affect: Mood normal.        Behavior: Behavior normal.    LABORATORY DATA:  I have reviewed the labs as listed.  CBC Latest Ref Rng & Units 12/07/2020 07/31/2020 03/23/2020  WBC 4.0 - 10.5 K/uL 6.1 7.3 5.8  Hemoglobin 13.0 - 17.0 g/dL 12.8(L) 13.9 13.4  Hematocrit 39.0 - 52.0 % 37.6(L) 41.5 41.3  Platelets 150 - 400 K/uL 179 176 197   CMP Latest Ref Rng & Units 12/07/2020 07/31/2020 06/08/2020  Glucose 70 - 99 mg/dL 132(H) 106(H) 92  BUN 8 - 23 mg/dL 23 20 15   Creatinine 0.61 - 1.24 mg/dL 0.92 0.76 0.84  Sodium 135 - 145 mmol/L 140 136 142  Potassium 3.5 - 5.1 mmol/L 4.2 4.5 5.0  Chloride 98 - 111 mmol/L 105 102 102  CO2 22 - 32 mmol/L 28 27 29   Calcium 8.9 - 10.3 mg/dL 9.1 9.1 9.3  Total Protein 6.5 - 8.1 g/dL 6.2(L) 6.7 6.1  Total Bilirubin 0.3 - 1.2 mg/dL 0.7 0.6 0.3  Alkaline Phos 38 - 126 U/L 65 68 92  AST 15 - 41 U/L 17 18 16   ALT 0 - 44 U/L 18 19 16       Component Value Date/Time   RBC 3.91 (L) 12/07/2020 1445   MCV 96.2 12/07/2020 1445   MCH 32.7 12/07/2020 1445    MCHC 34.0 12/07/2020 1445   RDW 12.5 12/07/2020 1445   LYMPHSABS 1.2 12/07/2020 1445   MONOABS 0.7 12/07/2020 1445   EOSABS 0.3 12/07/2020 1445   BASOSABS 0.0 12/07/2020 1445    DIAGNOSTIC IMAGING:  I have independently reviewed the scans and discussed with the patient. NM PET Image Restag (PS) Skull Base To Thigh  Result Date: 12/13/2020 CLINICAL DATA:  Subsequent treatment strategy for diffuse B-cell lymphoma. EXAM: NUCLEAR MEDICINE PET SKULL BASE TO THIGH TECHNIQUE: 11.1 mCi F-18 FDG was injected intravenously. Full-ring PET imaging was performed from the skull base to thigh after the radiotracer. CT data was obtained and used for attenuation correction and anatomic localization. Fasting blood glucose: 111 mg/dl COMPARISON:  07/31/2020 FINDINGS: Mediastinal blood pool activity: SUV max 3.36 Liver activity: SUV max 4.22 NECK: No hypermetabolic lymph nodes in the neck. Incidental CT findings: none CHEST: No hypermetabolic mediastinal or hilar nodes. No suspicious pulmonary nodules on the CT scan. Incidental CT findings: Aortic atherosclerosis and coronary artery calcifications. ABDOMEN/PELVIS: The nodal mass within the root of the small bowel mesentery in the central abdomen measures 10.3 x 4.7 cm and has an SUV max of 4.46, image 138/4. Deauville criteria 3. No new sites of FDG avid disease within the abdomen or pelvis. Incidental CT findings: Aortic atherosclerosis. Left adrenal gland adenoma measures 1.6 cm. Unchanged mild left pelvocaliectasis. SKELETON: No focal hypermetabolic activity to suggest skeletal metastasis. Incidental CT findings: none IMPRESSION: 1. Stable nodal mass at the root of the small bowel mesentery with SUV max of 4.46. Current FDG uptake is slightly above background liver activity (SUV max 4.22) consistent with Deauville criteria 3/4. Previously tumor SUV max was equal to 3.0 with SUV max within background liver activity of 4.3. Although not diagnostic for progression of  disease given the slight interval increase in FDG uptake this warrants continued close interval surveillance. 2. No new sites of FDG avid disease. 3. Aortic Atherosclerosis (ICD10-I70.0). Coronary artery calcifications. Electronically Signed   By: Kerby Moors M.D.   On: 12/13/2020 19:53     ASSESSMENT:  1.  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 -6 cycles of R-EPOCH from 05/11/2019 through 08/23/2019. -PET scan on 09/17/2019 showed central abdominal mass with SUV 2.9, measuring 10 x 3.7 cm.  Mildly enlarged right pelvic sidewall lymph node 12 mm SUV 1.7.  Areas of multifocal bone involvement with near complete resolution.  Deauville 2/3 in the five-point scale. -He was evaluated by lymphoma specialist Dr. Cassell Clement at the request [facility].  His case was discussed at tumor board.  No adjuvant radiation therapy or maintenance rituximab or transplant was recommended at this time. -PET scan on 12/14/2019 shows soft tissue and nodularity at the root and within the small bowel mesentery, grossly similar measuring 4.2 x 11.2 cm with SUV max 3.6, similar to 3.2 previously.  External iliac and inguinal lymph nodes do not show metabolic some low blood pool.  SUV 2.3. -Enlarging rounded soft tissue in the right maxillary sinus with periosteal thickening and mild hypermetabolic zone. -PET scan on 03/27/2020 shows confluent soft tissue density in the central small bowel mesentery 11.6 x 4.2 cm with no significant change in size.  SUV 3.0, previously 3.6.  Small left para-aortic lymph node 1 cm with no FDG uptake.  Right external iliac lymph node, 10 mm with SUV 1.8, previously 2.3.  No new or progressive areas.     2.  Bilateral leg DVT: -Ultrasound Doppler on 04/18/2019 showed bilateral leg DVT. -He is on Eliquis since  then. -Repeat Doppler on 12/29/2019 shows previous extensive right lower extremity DVT has resolved with minor residual nonocclusive right peroneal thrombus noted.  Very minimal residual thrombus burden.  Negative left leg DVT.   3.  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   PLAN:  1.  Double hit lymphoma: -He does not have any B symptoms.  He is working full-time. -Reviewed his labs from 12/07/2020 which did not show any concerning abnormalities.  Physical exam today did not reveal any palpable adenopathy or splenomegaly. -Reviewed PET scan from 12/13/2020 which showed stable nodal mass at the root of the small bowel mesentery with SUV max of 4.46. Current FDG uptake is slightly above background liver activity -RTC 6 months with labs and repeat PET imaging.   2.  Bilateral leg DVT: -Repeat Doppler on 04/03/2020 was negative.  Eliquis was discontinued.   3.  Anxiety: -Continue Ativan as needed.   4.  Hypertension: -Continue losartan, Toprol-XL and Norvasc.   Orders placed this encounter:  No orders of the defined types were placed in this encounter.  Ledell Peoples, MD Department of Hematology/Oncology [place] at [facility] Phone: [phone] Pager: [phone] Email: [email or web address]

## 2020-12-29 ENCOUNTER — Other Ambulatory Visit (HOSPITAL_COMMUNITY): Payer: Self-pay

## 2020-12-29 DIAGNOSIS — C8338 Diffuse large B-cell lymphoma, lymph nodes of multiple sites: Secondary | ICD-10-CM

## 2021-01-02 ENCOUNTER — Encounter (HOSPITAL_COMMUNITY): Payer: Self-pay | Admitting: Internal Medicine

## 2021-01-04 ENCOUNTER — Ambulatory Visit (HOSPITAL_COMMUNITY): Payer: Medicare Other | Admitting: Hematology

## 2021-01-11 ENCOUNTER — Encounter: Payer: Self-pay | Admitting: Family Medicine

## 2021-01-11 ENCOUNTER — Ambulatory Visit (INDEPENDENT_AMBULATORY_CARE_PROVIDER_SITE_OTHER): Payer: Medicare Other | Admitting: Family Medicine

## 2021-01-11 ENCOUNTER — Other Ambulatory Visit: Payer: Self-pay

## 2021-01-11 VITALS — BP 137/67 | HR 52 | Temp 98.1°F | Ht 66.0 in | Wt 223.8 lb

## 2021-01-11 DIAGNOSIS — Z23 Encounter for immunization: Secondary | ICD-10-CM

## 2021-01-11 DIAGNOSIS — H9193 Unspecified hearing loss, bilateral: Secondary | ICD-10-CM | POA: Insufficient documentation

## 2021-01-11 DIAGNOSIS — I10 Essential (primary) hypertension: Secondary | ICD-10-CM

## 2021-01-11 NOTE — Progress Notes (Signed)
Assessment & Plan:  1. Essential hypertension Well controlled on current regimen.   2. Bilateral hearing loss, unspecified hearing loss type Declined referral to audiology.  3. Immunization due - Pneumococcal polysaccharide vaccine 23-valent greater than or equal to 66yo subcutaneous/IM - given in office. -Declined TDaP and Shingrix due to cost of $47 each.   Return in about 6 months (around 07/14/2021) for annual physical.  Kevin Limes, MSN, APRN, FNP-C [facility]  Subjective:    Patient ID: Kevin [name], male    DOB: Feb 23, 1956, 65 y.o.   MRN: [id number]  Patient Care Team: Loman Brooklyn, FNP as PCP - General (Family Medicine) Gala Romney Cristopher Estimable, MD as Consulting Physician (Gastroenterology) Derek Jack, MD as Consulting Physician (Hematology)   Chief Complaint:  Chief Complaint  Patient presents with   Hypertension    2 month follow up     HPI: Kevin [name] is a 65 y.o. male presenting on 01/11/2021 for Hypertension (2 month follow up )  Patient is following up on hypertension.  At our last visit his hydrochlorothiazide was increased from 12.5 mg to 25 mg once daily.  He does not check his blood pressure at home.  He does eat low-salt.  He is not doing any exercise.  New complaints: Patient mention he was hard of hearing, but does not desire a referral to audiology for formal evaluation.   Social history:  Relevant past medical, surgical, family and social history reviewed and updated as indicated. Interim medical history since our last visit reviewed.  Allergies and medications reviewed and updated.  DATA REVIEWED: CHART IN EPIC  ROS: Negative unless specifically indicated above in HPI.    Current Outpatient Medications:    albuterol (VENTOLIN HFA) 108 (90 Base) MCG/ACT inhaler, Inhale 2 puffs into the lungs every 6 (six) hours as needed for wheezing or shortness of breath., Disp: , Rfl:    amLODipine (NORVASC) 10 MG  tablet, Take 1 tablet (10 mg total) by mouth daily., Disp: 90 tablet, Rfl: 1   aspirin EC 81 MG tablet, Take 81 mg by mouth daily. Swallow whole., Disp: , Rfl:    escitalopram (LEXAPRO) 10 MG tablet, Take 1 tablet (10 mg total) by mouth daily., Disp: 90 tablet, Rfl: 1   lidocaine-prilocaine (EMLA) cream, Apply 1 application topically as needed (port access)., Disp: , Rfl:    LORazepam (ATIVAN) 0.5 MG tablet, TAKE 1 TABLET (0.5 MG TOTAL) BY MOUTH EVERY 8 (EIGHT) HOURS. (Patient taking differently: Take 0.5 mg by mouth every 8 (eight) hours as needed for anxiety.), Disp: 60 tablet, Rfl: 3   losartan-hydrochlorothiazide (HYZAAR) 100-25 MG tablet, Take 1 tablet by mouth daily., Disp: 90 tablet, Rfl: 1   metoprolol succinate (TOPROL-XL) 50 MG 24 hr tablet, Take 1 tablet (50 mg total) by mouth daily. Take with or immediately following a meal., Disp: 90 tablet, Rfl: 1   omega-3 acid ethyl esters (LOVAZA) 1 g capsule, TAKE 2 CAPSULES BY MOUTH 2 TIMES DAILY. (Patient taking differently: Take 2 g by mouth 2 (two) times daily.), Disp: 120 capsule, Rfl: 4 No current facility-administered medications for this visit.  Facility-Administered Medications Ordered in Other Visits:    0.9 %  sodium chloride infusion, , Intravenous, Continuous, Derek Jack, MD, Last Rate: 20 mL/hr at 08/26/19 1105, New Bag at 08/26/19 1105   sodium chloride flush (NS) 0.9 % injection 10 mL, 10 mL, Intravenous, PRN, Derek Jack, MD, 10 mL at 08/26/19 1104   No Known Allergies  Past Medical History:  Diagnosis Date   Anxiety    Depression    Diffuse large B cell lymphoma ([place]) 04/26/2019   Hyperlipidemia 02/01/2014   Hypertension    Leg DVT (deep venous thromboembolism), acute, bilateral ([place]) 04/18/2019   Port-A-Cath in place 04/27/2019    Past Surgical History:  Procedure Laterality Date   COLONOSCOPY WITH PROPOFOL N/A 12/25/2020   Procedure: COLONOSCOPY WITH PROPOFOL;  Surgeon: Daneil Dolin, MD;  [facility];  Service: Endoscopy;  Laterality: N/A;  2:15pm   CYSTOSCOPY W/ URETERAL STENT PLACEMENT Bilateral 04/21/2019   Procedure: CYSTOSCOPY WITH RETROGRADE PYELOGRAM/URETERAL STENT PLACEMENT;  Surgeon: Cleon Gustin, MD;  Location: [facility];  Service: Urology;  Laterality: Bilateral;   ETHMOIDECTOMY Right 02/04/2020   Procedure: ETHMOIDECTOMY;  Surgeon: Leta Baptist, MD;  Location: [place];  Service: ENT;  Laterality: Right;   INGUINAL LYMPH NODE BIOPSY Right 04/21/2019   Procedure: INGUINAL LYMPH NODE BIOPSY;  Surgeon: Virl Cagey, MD;  Location: [facility];  Service: General;  Laterality: Right;   MAXILLARY ANTROSTOMY Right 02/04/2020   Procedure: MAXILLARY ANTROSTOMY WITH TISSUE REMOVAL;  Surgeon: Leta Baptist, MD;  Location: [place];  Service: ENT;  Laterality: Right;   PORTACATH PLACEMENT Left 05/07/2019   Procedure: INSERTION PORT-A-CATH (catheter left subclavian);  Surgeon: Virl Cagey, MD;  Location: [facility];  Service: General;  Laterality: Left;    Social History   Socioeconomic History   Marital status: Married    Spouse name: Not on file   Number of children: 3   Years of education: 10   Highest education level: Not on file  Occupational History   Not on file  Tobacco Use   Smoking status: Former    Packs/day: 3.00    Years: 30.00    Pack years: 90.00    Types: Cigarettes    Quit date: 04/27/1999    Years since quitting: 21.7   Smokeless tobacco: Never  Vaping Use   Vaping Use: Never used  Substance and Sexual Activity   Alcohol use: Yes    Comment: daily. 2-6 beer daily   Drug use: Never   Sexual activity: Yes  Other Topics Concern   Not on file  Social History Narrative   Not on file   Social Determinants of Health   Financial Resource Strain: Low Risk    Difficulty of Paying Living Expenses: Not very hard  Food Insecurity: No Food Insecurity   Worried About Running Out of Food in the Last Year: Never true   Ran  Out of Food in the Last Year: Never true  Transportation Needs: No Transportation Needs   Lack of Transportation (Medical): No   Lack of Transportation (Non-Medical): No  Physical Activity: Inactive   Days of Exercise per Week: 0 days   Minutes of Exercise per Session: 0 min  Stress: No Stress Concern Present   Feeling of Stress : Only a little  Social Connections: Moderately Integrated   Frequency of Communication with Friends and Family: More than three times a week   Frequency of Social Gatherings with Friends and Family: More than three times a week   Attends Religious Services: 1 to 4 times per year   Active Member of [employer] or Organizations: No   Attends Archivist Meetings: Never   Marital Status: Married  Human resources officer Violence: Not At Risk   Fear of Current or Ex-Partner: No   Emotionally Abused: No  Physically Abused: No   Sexually Abused: No        Objective:    BP 137/67   Pulse (!) 52   Temp 98.1 F (36.7 C) (Temporal)   Ht 5\' 6"  (1.676 m)   Wt 223 lb 12.8 oz (101.5 kg)   SpO2 96%   BMI 36.12 kg/m   Wt Readings from Last 3 Encounters:  01/11/21 223 lb 12.8 oz (101.5 kg)  12/26/20 224 lb 6.4 oz (101.8 kg)  12/21/20 224 lb (101.6 kg)    Physical Exam Vitals reviewed.  Constitutional:      General: He is not in acute distress.    Appearance: Normal appearance. He is obese. He is not ill-appearing, toxic-appearing or diaphoretic.  HENT:     Head: Normocephalic and atraumatic.  Eyes:     General: No scleral icterus.       Right eye: No discharge.        Left eye: No discharge.     Conjunctiva/sclera: Conjunctivae normal.  Cardiovascular:     Rate and Rhythm: Normal rate and regular rhythm.     Heart sounds: Normal heart sounds. No murmur heard.   No friction rub. No gallop.  Pulmonary:     Effort: Pulmonary effort is normal. No respiratory distress.     Breath sounds: Normal breath sounds. No stridor. No wheezing, rhonchi or rales.   Musculoskeletal:        General: Normal range of motion.     Cervical back: Normal range of motion.  Skin:    General: Skin is warm and dry.  Neurological:     Mental Status: He is alert and oriented to person, place, and time. Mental status is at baseline.  Psychiatric:        Mood and Affect: Mood normal.        Behavior: Behavior normal.        Thought Content: Thought content normal.        Judgment: Judgment normal.    Lab Results  Component Value Date   TSH 1.240 06/08/2020   Lab Results  Component Value Date   WBC 6.1 12/07/2020   HGB 12.8 (L) 12/07/2020   HCT 37.6 (L) 12/07/2020   MCV 96.2 12/07/2020   PLT 179 12/07/2020   Lab Results  Component Value Date   NA 140 12/07/2020   K 4.2 12/07/2020   CO2 28 12/07/2020   GLUCOSE 132 (H) 12/07/2020   BUN 23 12/07/2020   CREATININE 0.92 12/07/2020   BILITOT 0.7 12/07/2020   ALKPHOS 65 12/07/2020   AST 17 12/07/2020   ALT 18 12/07/2020   PROT 6.2 (L) 12/07/2020   ALBUMIN 3.8 12/07/2020   CALCIUM 9.1 12/07/2020   ANIONGAP 7 12/07/2020   Lab Results  Component Value Date   CHOL 172 06/08/2020   Lab Results  Component Value Date   HDL 64 06/08/2020   Lab Results  Component Value Date   LDLCALC 72 06/08/2020   Lab Results  Component Value Date   TRIG 219 (H) 06/08/2020   Lab Results  Component Value Date   CHOLHDL 2.7 06/08/2020   No results found for: HGBA1C

## 2021-03-08 ENCOUNTER — Other Ambulatory Visit: Payer: Self-pay | Admitting: Family Medicine

## 2021-03-08 DIAGNOSIS — I10 Essential (primary) hypertension: Secondary | ICD-10-CM

## 2021-03-30 ENCOUNTER — Inpatient Hospital Stay (HOSPITAL_COMMUNITY): Payer: Medicare Other | Attending: Hematology

## 2021-04-18 ENCOUNTER — Other Ambulatory Visit: Payer: Self-pay | Admitting: Family Medicine

## 2021-04-18 DIAGNOSIS — E781 Pure hyperglyceridemia: Secondary | ICD-10-CM

## 2021-05-14 ENCOUNTER — Other Ambulatory Visit: Payer: Self-pay | Admitting: Family Medicine

## 2021-05-14 DIAGNOSIS — I10 Essential (primary) hypertension: Secondary | ICD-10-CM

## 2021-06-18 ENCOUNTER — Other Ambulatory Visit: Payer: Self-pay | Admitting: Family Medicine

## 2021-06-18 DIAGNOSIS — F419 Anxiety disorder, unspecified: Secondary | ICD-10-CM

## 2021-06-22 ENCOUNTER — Inpatient Hospital Stay (HOSPITAL_COMMUNITY): Payer: Medicare Other | Attending: Hematology

## 2021-06-22 DIAGNOSIS — C8338 Diffuse large B-cell lymphoma, lymph nodes of multiple sites: Secondary | ICD-10-CM | POA: Insufficient documentation

## 2021-06-22 LAB — CBC WITH DIFFERENTIAL/PLATELET
Abs Immature Granulocytes: 0.03 10*3/uL (ref 0.00–0.07)
Basophils Absolute: 0 10*3/uL (ref 0.0–0.1)
Basophils Relative: 1 %
Eosinophils Absolute: 0.2 10*3/uL (ref 0.0–0.5)
Eosinophils Relative: 4 %
HCT: 38.1 % — ABNORMAL LOW (ref 39.0–52.0)
Hemoglobin: 13 g/dL (ref 13.0–17.0)
Immature Granulocytes: 1 %
Lymphocytes Relative: 15 %
Lymphs Abs: 1 10*3/uL (ref 0.7–4.0)
MCH: 32.7 pg (ref 26.0–34.0)
MCHC: 34.1 g/dL (ref 30.0–36.0)
MCV: 96 fL (ref 80.0–100.0)
Monocytes Absolute: 0.7 10*3/uL (ref 0.1–1.0)
Monocytes Relative: 11 %
Neutro Abs: 4.5 10*3/uL (ref 1.7–7.7)
Neutrophils Relative %: 68 %
Platelets: 191 10*3/uL (ref 150–400)
RBC: 3.97 MIL/uL — ABNORMAL LOW (ref 4.22–5.81)
RDW: 12.6 % (ref 11.5–15.5)
WBC: 6.6 10*3/uL (ref 4.0–10.5)
nRBC: 0 % (ref 0.0–0.2)

## 2021-06-22 LAB — COMPREHENSIVE METABOLIC PANEL
ALT: 19 U/L (ref 0–44)
AST: 18 U/L (ref 15–41)
Albumin: 3.6 g/dL (ref 3.5–5.0)
Alkaline Phosphatase: 67 U/L (ref 38–126)
Anion gap: 7 (ref 5–15)
BUN: 16 mg/dL (ref 8–23)
CO2: 27 mmol/L (ref 22–32)
Calcium: 8.5 mg/dL — ABNORMAL LOW (ref 8.9–10.3)
Chloride: 101 mmol/L (ref 98–111)
Creatinine, Ser: 0.81 mg/dL (ref 0.61–1.24)
GFR, Estimated: >60 mL/min (ref >60)
Glucose, Bld: 119 mg/dL — ABNORMAL HIGH (ref 70–99)
Potassium: 4 mmol/L (ref 3.5–5.1)
Sodium: 135 mmol/L (ref 135–145)
Total Bilirubin: 0.4 mg/dL (ref 0.3–1.2)
Total Protein: 6.1 g/dL — ABNORMAL LOW (ref 6.5–8.1)

## 2021-06-22 LAB — LACTATE DEHYDROGENASE: LDH: 118 U/L (ref 98–192)

## 2021-06-22 MED ORDER — HEPARIN SOD (PORK) LOCK FLUSH 100 UNIT/ML IV SOLN
500.0000 [IU] | Freq: Once | INTRAVENOUS | Status: AC
  Administered 2021-06-22: 11:00:00 500 [IU] via INTRAVENOUS

## 2021-06-22 MED ORDER — SODIUM CHLORIDE 0.9% FLUSH
10.0000 mL | INTRAVENOUS | Status: AC | PRN
  Administered 2021-06-22: 11:00:00 10 mL via INTRAVENOUS

## 2021-06-22 NOTE — Progress Notes (Unsigned)
Patients port flushed without difficulty.  Good blood return noted with no bruising or swelling noted at site.  Band aid applied.  VSS with discharge and left in satisfactory condition with no s/s of distress noted.  Discharge from clinic ambulatory in stable condition.  Alert and oriented X 3.  Follow up with [facility] as scheduled.  °

## 2021-06-22 NOTE — Patient Instructions (Signed)
[facility]  Discharge Instructions: °Thank you for choosing [facility] to provide your oncology and hematology care.  °If you have a lab appointment with the Cancer Center, please come in thru the Main Entrance and check in at the main information desk. ° °Wear comfortable clothing and clothing appropriate for easy access to any Portacath or PICC line.  ° °We strive to give you quality time with your provider. You may need to reschedule your appointment if you arrive late (15 or more minutes).  Arriving late affects you and other patients whose appointments are after yours.  Also, if you miss three or more appointments without notifying the office, you may be dismissed from the clinic at the provider’s discretion.    °  °For prescription refill requests, have your pharmacy contact our office and allow 72 hours for refills to be completed.   ° °Today you received the following chemotherapy and/or immunotherapy agents PORT flush    °  °To help prevent nausea and vomiting after your treatment, we encourage you to take your nausea medication as directed. ° °BELOW ARE SYMPTOMS THAT SHOULD BE REPORTED IMMEDIATELY: °*FEVER GREATER THAN 100.4 F (38 °C) OR HIGHER °*CHILLS OR SWEATING °*NAUSEA AND VOMITING THAT IS NOT CONTROLLED WITH YOUR NAUSEA MEDICATION °*UNUSUAL SHORTNESS OF BREATH °*UNUSUAL BRUISING OR BLEEDING °*URINARY PROBLEMS (pain or burning when urinating, or frequent urination) °*BOWEL PROBLEMS (unusual diarrhea, constipation, pain near the anus) °TENDERNESS IN MOUTH AND THROAT WITH OR WITHOUT PRESENCE OF ULCERS (sore throat, sores in mouth, or a toothache) °UNUSUAL RASH, SWELLING OR PAIN  °UNUSUAL VAGINAL DISCHARGE OR ITCHING  ° °Items with * indicate a potential emergency and should be followed up as soon as possible or go to the Emergency Department if any problems should occur. ° °Please show the CHEMOTHERAPY ALERT CARD or IMMUNOTHERAPY ALERT CARD at check-in to the Emergency  Department and triage nurse. ° °Should you have questions after your visit or need to cancel or reschedule your appointment, please contact [facility] [phone]  and follow the prompts.  Office hours are 8:00 a.m. to 4:30 p.m. Monday - Friday. Please note that voicemails left after 4:00 p.m. may not be returned until the following business day.  We are closed weekends and major holidays. You have access to a nurse at all times for urgent questions. Please call the main number to the clinic [phone] and follow the prompts. ° °For any non-urgent questions, you may also contact your provider using MyChart. We now offer e-Visits for anyone 18 and older to request care online for non-urgent symptoms. For details visit [email or web address]. °  °Also download the MyChart app! Go to the app store, search "MyChart", open the app, select [facility], and log in with your MyChart username and password. ° °Due to Covid, a mask is required upon entering the hospital/clinic. If you do not have a mask, one will be given to you upon arrival. For doctor visits, patients may have 1 support person aged 18 or older with them. For treatment visits, patients cannot have anyone with them due to current Covid guidelines and our immunocompromised population.  °

## 2021-06-26 ENCOUNTER — Other Ambulatory Visit (HOSPITAL_COMMUNITY): Payer: Medicare Other

## 2021-06-27 ENCOUNTER — Ambulatory Visit (HOSPITAL_COMMUNITY): Payer: Medicare Other

## 2021-06-28 ENCOUNTER — Other Ambulatory Visit: Payer: Self-pay | Admitting: Family Medicine

## 2021-06-28 ENCOUNTER — Encounter (HOSPITAL_COMMUNITY)
Admission: RE | Admit: 2021-06-28 | Discharge: 2021-06-28 | Disposition: A | Discharge status: Home / Self Care | Payer: Medicare Other | Source: Ambulatory Visit | Attending: Hematology and Oncology | Admitting: Hematology and Oncology

## 2021-06-28 ENCOUNTER — Other Ambulatory Visit: Payer: Self-pay

## 2021-06-28 DIAGNOSIS — I700 Atherosclerosis of aorta: Secondary | ICD-10-CM | POA: Diagnosis not present

## 2021-06-28 DIAGNOSIS — C8338 Diffuse large B-cell lymphoma, lymph nodes of multiple sites: Secondary | ICD-10-CM

## 2021-06-28 DIAGNOSIS — I10 Essential (primary) hypertension: Secondary | ICD-10-CM

## 2021-06-28 DIAGNOSIS — I2510 Atherosclerotic heart disease of native coronary artery without angina pectoris: Secondary | ICD-10-CM | POA: Diagnosis not present

## 2021-06-28 DIAGNOSIS — C8330 Diffuse large B-cell lymphoma, unspecified site: Secondary | ICD-10-CM | POA: Diagnosis not present

## 2021-06-28 MED ORDER — FLUDEOXYGLUCOSE F - 18 (FDG) INJECTION
12.8400 | Freq: Once | INTRAVENOUS | Status: AC
  Administered 2021-06-28: 09:00:00 12.343 via INTRAVENOUS

## 2021-07-03 NOTE — Progress Notes (Signed)
[address], [place] [address]   CLINIC:  Medical Oncology/Hematology  PCP:  Loman Brooklyn, [place] / [place] [address] [phone]   REASON FOR VISIT:  Follow-up for high-grade DLBCL  PRIOR THERAPY: R-EPOCH x 6 cycles from 05/11/2019 to 08/23/2019  CURRENT THERAPY: Surveillance  BRIEF ONCOLOGIC HISTORY:  Oncology History  Diffuse large B-cell lymphoma of lymph nodes of multiple regions ([facility])  04/26/2019 Initial Diagnosis   Diffuse large B-cell lymphoma of lymph nodes of multiple regions ([facility])   05/10/2019 -  Chemotherapy   The patient had pegfilgrastim (NEULASTA ONPRO KIT) injection 6 mg, 6 mg, Subcutaneous, Once, 1 of 1 cycle pegfilgrastim-jmdb (FULPHILA) injection 6 mg, 6 mg, Subcutaneous,  Once, 6 of 7 cycles Administration: 6 mg (05/18/2019), 6 mg (06/07/2019), 6 mg (06/26/2019), 6 mg (07/19/2019), 6 mg (08/09/2019), 6 mg (08/30/2019) DOXOrubicin (ADRIAMYCIN) 20 mg, etoposide (VEPESID) 104 mg, vinCRIStine (ONCOVIN) 0.8 mg in sodium chloride 0.9 % 500 mL chemo infusion, , Intravenous, Once, 6 of 7 cycles Administration:  (05/11/2019),  (05/31/2019),  (06/01/2019),  (05/12/2019),  (05/13/2019),  (05/14/2019),  (06/02/2019),  (06/03/2019),  (06/20/2019),  (06/21/2019),  (06/22/2019),  (07/12/2019),  (06/23/2019),  (07/13/2019),  (07/14/2019),  (07/15/2019),  (08/02/2019),  (08/03/2019),  (08/04/2019),  (08/05/2019),  (08/23/2019),  (08/24/2019),  (08/25/2019),  (08/26/2019) ondansetron (ZOFRAN) 8 mg in sodium chloride 0.9 % 50 mL IVPB, , Intravenous,  Once, 6 of 7 cycles Administration: 8 mg (05/10/2019), 8 mg (05/11/2019), 16 mg (05/17/2019), 8 mg (06/01/2019), 16 mg (06/04/2019), 8 mg (05/12/2019), 8 mg (05/13/2019), 8 mg (05/14/2019), 8 mg (06/02/2019), 8 mg (06/03/2019), 8 mg (06/20/2019), 16 mg (06/24/2019), 8 mg (06/22/2019), 8 mg (07/12/2019), 8 mg (06/23/2019), 8 mg (07/13/2019), 16 mg (07/16/2019), 8 mg (07/14/2019), 8 mg (07/15/2019), 8 mg (08/02/2019), 8  mg (08/03/2019), 8 mg (08/04/2019), 8 mg (08/05/2019), 16 mg (08/06/2019), 8 mg (08/23/2019), 8 mg (08/24/2019), 8 mg (08/25/2019), 8 mg (08/26/2019), 16 mg (08/27/2019) cyclophosphamide (CYTOXAN) 1,560 mg in sodium chloride 0.9 % 250 mL chemo infusion, 750 mg/m2 = 1,560 mg, Intravenous,  Once, 6 of 7 cycles Administration: 1,560 mg (05/17/2019), 1,560 mg (06/04/2019), 1,560 mg (06/24/2019), 1,560 mg (07/16/2019), 1,560 mg (08/06/2019), 1,560 mg (08/27/2019) riTUXimab-pvvr (RUXIENCE) 800 mg in sodium chloride 0.9 % 250 mL (2.4242 mg/mL) infusion, 375 mg/m2 = 800 mg, Intravenous,  Once, 6 of 7 cycles Dose modification: 100 mg (original dose 375 mg/m2, Cycle 1, Reason: Other (see comments), Comment: test dose), 600 mg (original dose 375 mg/m2, Cycle 1, Reason: Other (see comments), Comment: due to previous reaction giving test dose first), 400 mg (original dose 375 mg/m2, Cycle 2, Reason: Other (see comments), Comment: remainder of day before fluid - do not bill) Administration: 800 mg (05/10/2019), 800 mg (05/31/2019), 100 mg (05/17/2019), 400 mg (06/01/2019), 800 mg (06/21/2019), 800 mg (07/12/2019), 800 mg (08/02/2019), 800 mg (08/23/2019)   for chemotherapy treatment.     05/11/2019 - 05/11/2019 Chemotherapy   The patient had DOXOrubicin (ADRIAMYCIN) chemo injection 104 mg, 50 mg/m2, Intravenous,  Once, 0 of 6 cycles palonosetron (ALOXI) injection 0.25 mg, 0.25 mg, Intravenous,  Once, 0 of 6 cycles pegfilgrastim-jmdb (FULPHILA) injection 6 mg, 6 mg, Subcutaneous,  Once, 0 of 6 cycles vinCRIStine (ONCOVIN) 2 mg in sodium chloride 0.9 % 50 mL chemo infusion, 2 mg, Intravenous,  Once, 0 of 6 cycles cyclophosphamide (CYTOXAN) 1,540 mg in sodium chloride 0.9 % 250 mL chemo infusion, 750 mg/m2, Intravenous,  Once, 0 of 6 cycles  fosaprepitant (EMEND) 150 mg, dexamethasone (DECADRON) 12 mg in sodium chloride 0.9 % 145 mL IVPB, , Intravenous,  Once, 0 of 6 cycles riTUXimab-pvvr (RUXIENCE) 800 mg in sodium chloride 0.9 % 250 mL  (2.4242 mg/mL) infusion, 375 mg/m2, Intravenous,  Once, 0 of 6 cycles   for chemotherapy treatment.       CANCER STAGING:  Cancer Staging  No matching staging information was found for the patient.  INTERVAL HISTORY:  [name], a 66 y.o. male, returns for routine follow-up of his high-grade DLBCL. Kevin [name] was last seen on 09/06/2020.   Today he reports feeling good. He denies abdominal pain, fevers, night sweats, and weight loss. He is still working full time. He denies any infection in his left leg.   REVIEW OF SYSTEMS:  Review of Systems  Constitutional:  Negative for appetite change, fatigue, fever and unexpected weight change.  Gastrointestinal:  Negative for abdominal pain.  All other systems reviewed and are negative.  PAST MEDICAL/SURGICAL HISTORY:  Past Medical History:  Diagnosis Date   Anxiety    Depression    Diffuse large B cell lymphoma ([facility]) 04/26/2019   Hyperlipidemia 02/01/2014   Hypertension    Leg DVT (deep venous thromboembolism), acute, bilateral ([facility]) 04/18/2019   Port-A-Cath in place 04/27/2019   Past Surgical History:  Procedure Laterality Date   COLONOSCOPY WITH PROPOFOL N/A 12/25/2020   Procedure: COLONOSCOPY WITH PROPOFOL;  Surgeon: Daneil Dolin, MD;  [facility];  Service: Endoscopy;  Laterality: N/A;  2:15pm   CYSTOSCOPY W/ URETERAL STENT PLACEMENT Bilateral 04/21/2019   Procedure: CYSTOSCOPY WITH RETROGRADE PYELOGRAM/URETERAL STENT PLACEMENT;  Surgeon: Cleon Gustin, MD;  Location: [facility];  Service: Urology;  Laterality: Bilateral;   ETHMOIDECTOMY Right 02/04/2020   Procedure: ETHMOIDECTOMY;  Surgeon: Leta Baptist, MD;  Location: [facility];  Service: ENT;  Laterality: Right;   INGUINAL LYMPH NODE BIOPSY Right 04/21/2019   Procedure: INGUINAL LYMPH NODE BIOPSY;  Surgeon: Virl Cagey, MD;  Location: [facility];  Service: General;  Laterality: Right;   MAXILLARY ANTROSTOMY Right 02/04/2020   Procedure:  MAXILLARY ANTROSTOMY WITH TISSUE REMOVAL;  Surgeon: Leta Baptist, MD;  Location: [facility];  Service: ENT;  Laterality: Right;   PORTACATH PLACEMENT Left 05/07/2019   Procedure: INSERTION PORT-A-CATH (catheter left subclavian);  Surgeon: Virl Cagey, MD;  Location: [facility];  Service: General;  Laterality: Left;    SOCIAL HISTORY:  Social History   Socioeconomic History   Marital status: Married    Spouse name: Not on file   Number of children: 3   Years of education: 10   Highest education level: Not on file  Occupational History   Not on file  Tobacco Use   Smoking status: Former    Packs/day: 3.00    Years: 30.00    Pack years: 90.00    Types: Cigarettes    Quit date: 04/27/1999    Years since quitting: 22.2   Smokeless tobacco: Never  Vaping Use   Vaping Use: Never used  Substance and Sexual Activity   Alcohol use: Yes    Comment: daily. 2-6 beer daily   Drug use: Never   Sexual activity: Yes  Other Topics Concern   Not on file  Social History Narrative   Not on file   Social Determinants of Health   Financial Resource Strain: Low Risk    Difficulty of Paying Living Expenses: Not very hard  Food Insecurity: No Food Insecurity  Worried About Charity fundraiser in the Last Year: Never true   [place] in the Last Year: Never true  Transportation Needs: No Transportation Needs   Lack of Transportation (Medical): No   Lack of Transportation (Non-Medical): No  Physical Activity: Inactive   Days of Exercise per Week: 0 days   Minutes of Exercise per Session: 0 min  Stress: No Stress Concern Present   Feeling of Stress : Only a little  Social Connections: Moderately Integrated   Frequency of Communication with Friends and Family: More than three times a week   Frequency of Social Gatherings with Friends and Family: More than three times a week   Attends Religious Services: 1 to 4 times per year   Active Member of [employer] or Organizations: No    Attends Music therapist: Never   Marital Status: Married  Human resources officer Violence: Not At Risk   Fear of Current or Ex-Partner: No   Emotionally Abused: No   Physically Abused: No   Sexually Abused: No    FAMILY HISTORY:  Family History  Problem Relation Age of Onset   Diabetes Brother    Diabetes Mother    Leukemia Brother    Diabetes Son    Colon cancer Neg Hx     CURRENT MEDICATIONS:  Current Outpatient Medications  Medication Sig Dispense Refill   amLODipine (NORVASC) 10 MG tablet TAKE 1 TABLET BY MOUTH EVERY DAY 90 tablet 0   aspirin EC 81 MG tablet Take 81 mg by mouth daily. Swallow whole.     escitalopram (LEXAPRO) 10 MG tablet TAKE 1 TABLET BY MOUTH EVERY DAY 90 tablet 0   lidocaine-prilocaine (EMLA) cream Apply 1 application topically as needed (port access).     LORazepam (ATIVAN) 0.5 MG tablet TAKE 1 TABLET (0.5 MG TOTAL) BY MOUTH EVERY 8 (EIGHT) HOURS. (Patient taking differently: Take 0.5 mg by mouth every 8 (eight) hours as needed for anxiety.) 60 tablet 3   losartan-hydrochlorothiazide (HYZAAR) 100-25 MG tablet TAKE 1 TABLET BY MOUTH EVERY DAY 90 tablet 0   metoprolol succinate (TOPROL-XL) 50 MG 24 hr tablet TAKE 1 TABLET BY MOUTH EVERY DAY WITH OR IMMEDIATELY FOLLOWING A MEAL 90 tablet 0   omega-3 acid ethyl esters (LOVAZA) 1 g capsule TAKE 2 CAPSULES BY MOUTH TWICE A DAY 360 capsule 0   No current facility-administered medications for this visit.   Facility-Administered Medications Ordered in Other Visits  Medication Dose Route Frequency Provider Last Rate Last Admin   0.9 %  sodium chloride infusion   Intravenous Continuous Derek Jack, MD 20 mL/hr at 08/26/19 1105 New Bag at 08/26/19 1105   sodium chloride flush (NS) 0.9 % injection 10 mL  10 mL Intravenous PRN Derek Jack, MD   10 mL at 08/26/19 1104   sodium chloride flush (NS) 0.9 % injection 10 mL  10 mL Intravenous PRN Derek Jack, MD   10 mL at 06/22/21 1107     ALLERGIES:  No Known Allergies  PHYSICAL EXAM:  Performance status (ECOG): 1 - Symptomatic but completely ambulatory  Vitals:   07/04/21 1503  BP: (!) 151/65  Pulse: (!) 56  Resp: 18  Temp: 98.8 F (37.1 C)  SpO2: 97%   Wt Readings from Last 3 Encounters:  07/04/21 230 lb 1.6 oz (104.4 kg)  06/22/21 231 lb (104.8 kg)  01/11/21 223 lb 12.8 oz (101.5 kg)   Physical Exam Vitals reviewed.  Constitutional:      Appearance:  Normal appearance.  Cardiovascular:     Rate and Rhythm: Normal rate and regular rhythm.     Pulses: Normal pulses.     Heart sounds: Normal heart sounds.  Pulmonary:     Effort: Pulmonary effort is normal.     Breath sounds: Normal breath sounds.  Abdominal:     Palpations: Abdomen is soft. There is no hepatomegaly, splenomegaly or mass.     Tenderness: There is no abdominal tenderness.  Lymphadenopathy:     Lower Body: No right inguinal adenopathy. No left inguinal adenopathy.  Neurological:     General: No focal deficit present.     Mental Status: He is alert and oriented to person, place, and time.  Psychiatric:        Mood and Affect: Mood normal.        Behavior: Behavior normal.     LABORATORY DATA:  I have reviewed the labs as listed.  CBC Latest Ref Rng & Units 06/22/2021 12/07/2020 07/31/2020  WBC 4.0 - 10.5 K/uL 6.6 6.1 7.3  Hemoglobin 13.0 - 17.0 g/dL 13.0 12.8(L) 13.9  Hematocrit 39.0 - 52.0 % 38.1(L) 37.6(L) 41.5  Platelets 150 - 400 K/uL 191 179 176   CMP Latest Ref Rng & Units 06/22/2021 12/07/2020 07/31/2020  Glucose 70 - 99 mg/dL 119(H) 132(H) 106(H)  BUN 8 - 23 mg/dL $Remove'16 23 20  'FiZEWtv$ Creatinine 0.61 - 1.24 mg/dL 0.81 0.92 0.76  Sodium 135 - 145 mmol/L 135 140 136  Potassium 3.5 - 5.1 mmol/L 4.0 4.2 4.5  Chloride 98 - 111 mmol/L 101 105 102  CO2 22 - 32 mmol/L $RemoveB'27 28 27  'YcZATLfM$ Calcium 8.9 - 10.3 mg/dL 8.5(L) 9.1 9.1  Total Protein 6.5 - 8.1 g/dL 6.1(L) 6.2(L) 6.7  Total Bilirubin 0.3 - 1.2 mg/dL 0.4 0.7 0.6  Alkaline Phos 38 - 126 U/L  67 65 68  AST 15 - 41 U/L $Remo'18 17 18  'EMDDc$ ALT 0 - 44 U/L $Remo'19 18 19    'omWqe$ DIAGNOSTIC IMAGING:  I have independently reviewed the scans and discussed with the patient. NM PET Image Restag (PS) Skull Base To Thigh  Result Date: 06/29/2021 CLINICAL DATA:  Subsequent treatment strategy for diffuse B-cell lymphoma. EXAM: NUCLEAR MEDICINE PET SKULL BASE TO THIGH TECHNIQUE: 12.34 mCi F-18 FDG was injected intravenously. Full-ring PET imaging was performed from the skull base to thigh after the radiotracer. CT data was obtained and used for attenuation correction and anatomic localization. Fasting blood glucose: 96 mg/dl COMPARISON:  Numerous prior PET CTs.  The most recent is 12/13/2020 FINDINGS: Mediastinal blood pool activity: SUV max 2.91 Liver activity: SUV max 4.45 NECK: No hypermetabolic lymph nodes in the neck. Incidental CT findings: none CHEST: No hypermetabolic mediastinal or hilar nodes. No suspicious pulmonary nodules on the CT scan. Incidental CT findings: Stable left-sided power port. Stable age advanced aortic and coronary artery calcifications. ABDOMEN/PELVIS: Persistent ill-defined difficult to accurately measure matted soft tissue density in the root of the small bowel mesentery surrounding some scattered mesenteric nodes. The SUV max is 3.91. Deauville 3. On the prior study SUV max was 4.46 and liver activity was 4.22. No retroperitoneal adenopathy.  No pelvic or inguinal adenopathy. There is a small 13 x 8 mm soft tissue nodule near the base of the penis and just medial to the left inguinal canal. This is mildly hypermetabolic with SUV max of 5.27. It was also present on the prior PET-CT but was smaller and SUV max is 1.62. This could be an unusual  location of an inguinal node and attention on follow-up studies is suggested. Do not see any other adenopathy. Incidental CT findings: Stable advanced atherosclerotic calcifications involving the aorta, iliac arteries and branch vessels. Stable thick walled  bladder. SKELETON: No focal hypermetabolic activity to suggest skeletal metastasis. Incidental CT findings: none IMPRESSION: 1. Persistent ill-defined matted soft tissue density in the root of the small bowel mesentery. This is slightly improved compared to prior study. Deauville 3. 2. No new or progressive findings in the chest, abdomen or pelvis. 3. Enlarging and hypermetabolic soft tissue nodule medial to the left inguinal canal and near the base of the penis could be an unusual location for a lymph node. Recommend attention on future studies. Electronically Signed   By: Marijo Sanes M.D.   On: 06/29/2021 09:38     ASSESSMENT:  1.  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 -6 cycles of R-EPOCH from 05/11/2019 through 08/23/2019. -PET scan on 09/17/2019 showed central abdominal mass with SUV 2.9, measuring 10 x 3.7 cm.  Mildly enlarged right pelvic sidewall lymph node 12 mm SUV 1.7.  Areas of multifocal bone involvement with near complete resolution.  Deauville 2/3 in the five-point scale. -He was evaluated by lymphoma specialist Dr. Cassell Clement at the request [facility].  His case was discussed at tumor board.  No adjuvant radiation therapy or maintenance rituximab or transplant was recommended at this time. -PET scan on 12/14/2019 shows soft tissue and nodularity at the root and within the small bowel mesentery, grossly similar measuring 4.2 x 11.2 cm with SUV max 3.6, similar to 3.2 previously.  External iliac and inguinal lymph nodes do not show metabolic some low blood pool.  SUV 2.3. -Enlarging rounded soft tissue in the right maxillary sinus with periosteal thickening and mild hypermetabolic zone. -PET scan on 03/27/2020 shows confluent soft tissue density in the central small bowel mesentery 11.6 x 4.2 cm with no  significant change in size.  SUV 3.0, previously 3.6.  Small left para-aortic lymph node 1 cm with no FDG uptake.  Right external iliac lymph node, 10 mm with SUV 1.8, previously 2.3.  No new or progressive areas.     2.  Bilateral leg DVT: -Ultrasound Doppler on 04/18/2019 showed bilateral leg DVT. -He is on Eliquis since then. -Repeat Doppler on 12/29/2019 shows previous extensive right lower extremity DVT has resolved with minor residual nonocclusive right peroneal thrombus noted.  Very minimal residual thrombus burden.  Negative left leg DVT.   3.  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   PLAN:  1.  Double hit lymphoma: - He does not have any B symptoms.  He is working full-time. - Physical examination today did not reveal any palpable adenopathy. - Reviewed labs from 06/22/2021 which showed normal LFTs and LDH.  CBC was grossly normal. - Reviewed PET scan from 06/28/2021 which showed a small 13 x 8 mm soft tissue nodule near the base of the penis, just medial to the left inguinal canal.  Mildly hypermetabolic with SUV 8.14.  This was also seen on prior PET CT scan but was smaller and less brighter in June.  Unusual location for a lymph node.  Persistent ill-defined soft tissue in the root of the small bowel mesentery, slightly improved  from prior study, SUV 3.91.  No new findings. - Recommend RTC 6 months for follow-up with repeat labs and PET scan.   2.  Bilateral leg DVT: - Repeat Doppler was negative on 04/03/2020.  Eliquis was discontinued.   3.  Hypertension: - Continue losartan, Toprol-XL and Norvasc.   Orders placed this encounter:  No orders of the defined types were placed in this encounter.    Derek Jack, MD [facility] [phone]   I, Thana Ates, am acting as a scribe for Dr. Derek Jack.  I, Derek Jack MD, have reviewed the above documentation for accuracy and completeness, and I agree with the above.

## 2021-07-04 ENCOUNTER — Inpatient Hospital Stay (HOSPITAL_COMMUNITY): Payer: Medicare Other | Attending: Hematology | Admitting: Hematology

## 2021-07-04 ENCOUNTER — Other Ambulatory Visit: Payer: Self-pay

## 2021-07-04 VITALS — BP 151/65 | HR 56 | Temp 98.8°F | Resp 18 | Ht 66.0 in | Wt 230.1 lb

## 2021-07-04 DIAGNOSIS — Z87891 Personal history of nicotine dependence: Secondary | ICD-10-CM | POA: Diagnosis not present

## 2021-07-04 DIAGNOSIS — I10 Essential (primary) hypertension: Secondary | ICD-10-CM | POA: Diagnosis not present

## 2021-07-04 DIAGNOSIS — Z86718 Personal history of other venous thrombosis and embolism: Secondary | ICD-10-CM | POA: Insufficient documentation

## 2021-07-04 DIAGNOSIS — C8338 Diffuse large B-cell lymphoma, lymph nodes of multiple sites: Secondary | ICD-10-CM | POA: Insufficient documentation

## 2021-07-04 NOTE — Patient Instructions (Signed)
[facility] Discharge Instructions  You were seen and examined today by Dr. Delton Coombes. He reviewed your most recent labs and scan. The scan looks better except one spot that we will keep an eye on. Labs look good except your calcium is low. Please keep follow up appointment as scheduled in 6 months.   Thank you for choosing [facility] to provide your oncology and hematology care.  To afford each patient quality time with our provider, please arrive at least 15 minutes before your scheduled appointment time.   If you have a lab appointment with the [facility] please come in thru the Main Entrance and check in at the main information desk.  You need to re-schedule your appointment should you arrive 10 or more minutes late.  We strive to give you quality time with our providers, and arriving late affects you and other patients whose appointments are after yours.  Also, if you no show three or more times for appointments you may be dismissed from the clinic at the providers discretion.     Again, thank you for choosing [facility].  Our hope is that these requests will decrease the amount of time that you wait before being seen by our physicians.       _____________________________________________________________  Should you have questions after your visit to [facility], please contact our office at [phone] and follow the prompts.  Our office hours are 8:00 a.m. and 4:30 p.m. Monday - Friday.  Please note that voicemails left after 4:00 p.m. may not be returned until the following business day.  We are closed weekends and major holidays.  You do have access to a nurse 24-7, just call the main number to the clinic [phone] and do not press any options, hold on the line and a nurse will answer the phone.    For prescription refill requests, have your pharmacy contact our office and allow 72  hours.    Due to Covid, you will need to wear a mask upon entering the hospital. If you do not have a mask, a mask will be given to you at the Main Entrance upon arrival. For doctor visits, patients may have 1 support person age 43 or older with them. For treatment visits, patients can not have anyone with them due to social distancing guidelines and our immunocompromised population.

## 2021-07-17 ENCOUNTER — Ambulatory Visit (INDEPENDENT_AMBULATORY_CARE_PROVIDER_SITE_OTHER): Payer: Medicare Other | Admitting: Family Medicine

## 2021-07-17 ENCOUNTER — Encounter: Payer: Self-pay | Admitting: Family Medicine

## 2021-07-17 VITALS — BP 146/65 | HR 50 | Temp 96.3°F | Ht 66.0 in | Wt 224.6 lb

## 2021-07-17 DIAGNOSIS — I10 Essential (primary) hypertension: Secondary | ICD-10-CM

## 2021-07-17 DIAGNOSIS — E781 Pure hyperglyceridemia: Secondary | ICD-10-CM

## 2021-07-17 DIAGNOSIS — R7309 Other abnormal glucose: Secondary | ICD-10-CM

## 2021-07-17 DIAGNOSIS — F419 Anxiety disorder, unspecified: Secondary | ICD-10-CM | POA: Diagnosis not present

## 2021-07-17 DIAGNOSIS — Z23 Encounter for immunization: Secondary | ICD-10-CM | POA: Diagnosis not present

## 2021-07-17 MED ORDER — OMEGA-3-ACID ETHYL ESTERS 1 G PO CAPS: 2.0000 | ORAL_CAPSULE | Freq: Two times a day (BID) | ORAL | 1 refills | Status: DC

## 2021-07-17 MED ORDER — LOSARTAN POTASSIUM-HCTZ 100-25 MG PO TABS: 1.0000 | ORAL_TABLET | Freq: Every day | ORAL | 1 refills | Status: DC

## 2021-07-17 NOTE — Progress Notes (Signed)
Assessment & Plan:  1. Essential hypertension Well controlled on current regimen.  - losartan-hydrochlorothiazide (HYZAAR) 100-25 MG tablet; Take 1 tablet by mouth daily.  Dispense: 90 tablet; Refill: 1  2. Hypertriglyceridemia Labs to assess. - omega-3 acid ethyl esters (LOVAZA) 1 g capsule; Take 2 capsules (2 g total) by mouth 2 (two) times daily.  Dispense: 360 capsule; Refill: 1 - Lipid panel  3. Anxiety Well controlled on current regimen.   4. Elevated glucose - Bayer DCA Hb A1c Waived  5. Need for immunization against influenza Influenza given in office today.   Return in about 6 months (around 01/14/2022) for annual physical.  Hendricks Limes, MSN, APRN, FNP-C [facility]  Subjective:    Patient ID: Kevin [name], male    DOB: 1956-03-23, 66 y.o.   MRN: [id number]  Patient Care Team: Loman Brooklyn, FNP as PCP - General (Family Medicine) Gala Romney Cristopher Estimable, MD as Consulting Physician (Gastroenterology) Derek Jack, MD as Consulting Physician (Hematology)   Chief Complaint:  Chief Complaint  Patient presents with   Medical Management of Chronic Issues    HPI: Kevin [name] is a 66 y.o. male presenting on 07/17/2021 for Medical Management of Chronic Issues  Hypertension: He is taking amlodipine 10 mg daily, metoprolol 50 mg daily, and losartan-HCTZ 100-25 daily.  He does not check his blood pressure at home.  He does eat low-salt.  He is not doing any exercise.  Hypertriglyceridemia: taking Lovaza twice daily. He has only had a pack of nabs today at 10 AM (6 hours ago).  The 10-year ASCVD risk score (Arnett DK, et al., 2019) is: 15.7%   Values used to calculate the score:     Age: 62 years     Sex: Male     Is Non-Hispanic African American: No     Diabetic: No     Tobacco smoker: No     Systolic Blood Pressure: 756 mmHg     Is BP treated: Yes     HDL Cholesterol: 64 mg/dL     Total Cholesterol: 172 mg/dL  Anxiety: taking  Lexapro 10 mg daily and Ativan 0.5 mg (prescribed by oncology) as needed.  GAD 7 : Generalized Anxiety Score 07/17/2021 11/08/2020 09/07/2020 06/08/2020  Nervous, Anxious, on Edge 0 0 0 0  Control/stop worrying 0 0 0 0  Worry too much - different things 0 0 0 0  Trouble relaxing 0 0 0 0  Restless 0 0 0 0  Easily annoyed or irritable 0 0 0 1  Afraid - awful might happen 0 0 0 0  Total GAD 7 Score 0 0 0 1  Anxiety Difficulty Not difficult at all Not difficult at all Not difficult at all -    New complaints: None   Social history:  Relevant past medical, surgical, family and social history reviewed and updated as indicated. Interim medical history since our last visit reviewed.  Allergies and medications reviewed and updated.  DATA REVIEWED: CHART IN EPIC  ROS: Negative unless specifically indicated above in HPI.    Current Outpatient Medications:    amLODipine (NORVASC) 10 MG tablet, TAKE 1 TABLET BY MOUTH EVERY DAY, Disp: 90 tablet, Rfl: 0   aspirin EC 81 MG tablet, Take 81 mg by mouth daily. Swallow whole., Disp: , Rfl:    escitalopram (LEXAPRO) 10 MG tablet, TAKE 1 TABLET BY MOUTH EVERY DAY, Disp: 90 tablet, Rfl: 0   lidocaine-prilocaine (EMLA) cream, Apply 1 application topically as  needed (port access)., Disp: , Rfl:    LORazepam (ATIVAN) 0.5 MG tablet, TAKE 1 TABLET (0.5 MG TOTAL) BY MOUTH EVERY 8 (EIGHT) HOURS. (Patient taking differently: Take 0.5 mg by mouth every 8 (eight) hours as needed for anxiety.), Disp: 60 tablet, Rfl: 3   losartan-hydrochlorothiazide (HYZAAR) 100-25 MG tablet, TAKE 1 TABLET BY MOUTH EVERY DAY, Disp: 90 tablet, Rfl: 0   metoprolol succinate (TOPROL-XL) 50 MG 24 hr tablet, TAKE 1 TABLET BY MOUTH EVERY DAY WITH OR IMMEDIATELY FOLLOWING A MEAL, Disp: 90 tablet, Rfl: 0   omega-3 acid ethyl esters (LOVAZA) 1 g capsule, TAKE 2 CAPSULES BY MOUTH TWICE A DAY, Disp: 360 capsule, Rfl: 0 No current facility-administered medications for this  visit.  Facility-Administered Medications Ordered in Other Visits:    0.9 %  sodium chloride infusion, , Intravenous, Continuous, Derek Jack, MD, Last Rate: 20 mL/hr at 08/26/19 1105, New Bag at 08/26/19 1105   sodium chloride flush (NS) 0.9 % injection 10 mL, 10 mL, Intravenous, PRN, Derek Jack, MD, 10 mL at 08/26/19 1104   sodium chloride flush (NS) 0.9 % injection 10 mL, 10 mL, Intravenous, PRN, Derek Jack, MD, 10 mL at 06/22/21 1107   No Known Allergies Past Medical History:  Diagnosis Date   Anxiety    Depression    Diffuse large B cell lymphoma ([place]) 04/26/2019   Hyperlipidemia 02/01/2014   Hypertension    Leg DVT (deep venous thromboembolism), acute, bilateral ([place]) 04/18/2019   Port-A-Cath in place 04/27/2019    Past Surgical History:  Procedure Laterality Date   COLONOSCOPY WITH PROPOFOL N/A 12/25/2020   Procedure: COLONOSCOPY WITH PROPOFOL;  Surgeon: Daneil Dolin, MD;  [facility];  Service: Endoscopy;  Laterality: N/A;  2:15pm   CYSTOSCOPY W/ URETERAL STENT PLACEMENT Bilateral 04/21/2019   Procedure: CYSTOSCOPY WITH RETROGRADE PYELOGRAM/URETERAL STENT PLACEMENT;  Surgeon: Cleon Gustin, MD;  Location: [facility];  Service: Urology;  Laterality: Bilateral;   ETHMOIDECTOMY Right 02/04/2020   Procedure: ETHMOIDECTOMY;  Surgeon: Leta Baptist, MD;  Location: [facility];  Service: ENT;  Laterality: Right;   INGUINAL LYMPH NODE BIOPSY Right 04/21/2019   Procedure: INGUINAL LYMPH NODE BIOPSY;  Surgeon: Virl Cagey, MD;  Location: [facility];  Service: General;  Laterality: Right;   MAXILLARY ANTROSTOMY Right 02/04/2020   Procedure: MAXILLARY ANTROSTOMY WITH TISSUE REMOVAL;  Surgeon: Leta Baptist, MD;  Location: [facility];  Service: ENT;  Laterality: Right;   PORTACATH PLACEMENT Left 05/07/2019   Procedure: INSERTION PORT-A-CATH (catheter left subclavian);  Surgeon: Virl Cagey, MD;  Location: [facility];  Service:  General;  Laterality: Left;    Social History   Socioeconomic History   Marital status: Married    Spouse name: Not on file   Number of children: 3   Years of education: 10   Highest education level: Not on file  Occupational History   Not on file  Tobacco Use   Smoking status: Former    Packs/day: 3.00    Years: 30.00    Pack years: 90.00    Types: Cigarettes    Quit date: 04/27/1999    Years since quitting: 22.2   Smokeless tobacco: Never  Vaping Use   Vaping Use: Never used  Substance and Sexual Activity   Alcohol use: Yes    Comment: daily. 2-6 beer daily   Drug use: Never   Sexual activity: Yes  Other Topics Concern   Not on file  Social History Narrative  Not on file   Social Determinants of Health   Financial Resource Strain: Low Risk    Difficulty of Paying Living Expenses: Not very hard  Food Insecurity: No Food Insecurity   Worried About Running Out of Food in the Last Year: Never true   Ran Out of Food in the Last Year: Never true  Transportation Needs: No Transportation Needs   Lack of Transportation (Medical): No   Lack of Transportation (Non-Medical): No  Physical Activity: Inactive   Days of Exercise per Week: 0 days   Minutes of Exercise per Session: 0 min  Stress: No Stress Concern Present   Feeling of Stress : Only a little  Social Connections: Moderately Integrated   Frequency of Communication with Friends and Family: More than three times a week   Frequency of Social Gatherings with Friends and Family: More than three times a week   Attends Religious Services: 1 to 4 times per year   Active Member of [employer] or Organizations: No   Attends Archivist Meetings: Never   Marital Status: Married  Human resources officer Violence: Not At Risk   Fear of Current or Ex-Partner: No   Emotionally Abused: No   Physically Abused: No   Sexually Abused: No        Objective:    BP (!) 146/65    Pulse (!) 50    Temp (!) 96.3 F (35.7 C)  (Temporal)    Ht 5\' 6"  (1.676 m)    Wt 224 lb 9.6 oz (101.9 kg)    SpO2 92%    BMI 36.25 kg/m   Wt Readings from Last 3 Encounters:  07/17/21 224 lb 9.6 oz (101.9 kg)  07/04/21 230 lb 1.6 oz (104.4 kg)  06/22/21 231 lb (104.8 kg)    Physical Exam Vitals reviewed.  Constitutional:      General: He is not in acute distress.    Appearance: Normal appearance. He is obese. He is not ill-appearing, toxic-appearing or diaphoretic.  HENT:     Head: Normocephalic and atraumatic.  Eyes:     General: No scleral icterus.       Right eye: No discharge.        Left eye: No discharge.     Conjunctiva/sclera: Conjunctivae normal.  Cardiovascular:     Rate and Rhythm: Normal rate and regular rhythm.     Heart sounds: Normal heart sounds. No murmur heard.   No friction rub. No gallop.  Pulmonary:     Effort: Pulmonary effort is normal. No respiratory distress.     Breath sounds: Normal breath sounds. No stridor. No wheezing, rhonchi or rales.  Musculoskeletal:        General: Normal range of motion.     Cervical back: Normal range of motion.  Skin:    General: Skin is warm and dry.  Neurological:     Mental Status: He is alert and oriented to person, place, and time. Mental status is at baseline.  Psychiatric:        Mood and Affect: Mood normal.        Behavior: Behavior normal.        Thought Content: Thought content normal.        Judgment: Judgment normal.    Lab Results  Component Value Date   TSH 1.240 06/08/2020   Lab Results  Component Value Date   WBC 6.6 06/22/2021   HGB 13.0 06/22/2021   HCT 38.1 (L) 06/22/2021   MCV 96.0 06/22/2021  PLT 191 06/22/2021   Lab Results  Component Value Date   NA 135 06/22/2021   K 4.0 06/22/2021   CO2 27 06/22/2021   GLUCOSE 119 (H) 06/22/2021   BUN 16 06/22/2021   CREATININE 0.81 06/22/2021   BILITOT 0.4 06/22/2021   ALKPHOS 67 06/22/2021   AST 18 06/22/2021   ALT 19 06/22/2021   PROT 6.1 (L) 06/22/2021   ALBUMIN 3.6  06/22/2021   CALCIUM 8.5 (L) 06/22/2021   ANIONGAP 7 06/22/2021   Lab Results  Component Value Date   CHOL 172 06/08/2020   Lab Results  Component Value Date   HDL 64 06/08/2020   Lab Results  Component Value Date   LDLCALC 72 06/08/2020   Lab Results  Component Value Date   TRIG 219 (H) 06/08/2020   Lab Results  Component Value Date   CHOLHDL 2.7 06/08/2020   No results found for: HGBA1C

## 2021-07-18 LAB — BAYER DCA HB A1C WAIVED: HB A1C (BAYER DCA - WAIVED): 4.8 % (ref 4.8–5.6)

## 2021-07-18 LAB — LIPID PANEL
Chol/HDL Ratio: 3.5 ratio (ref 0.0–5.0)
Cholesterol, Total: 193 mg/dL (ref 100–199)
HDL: 55 mg/dL (ref >39)
LDL Chol Calc (NIH): 112 mg/dL — ABNORMAL HIGH (ref 0–99)
Triglycerides: 146 mg/dL (ref 0–149)
VLDL Cholesterol Cal: 26 mg/dL (ref 5–40)

## 2021-08-15 ENCOUNTER — Encounter: Payer: Self-pay | Admitting: *Deleted

## 2021-09-18 ENCOUNTER — Other Ambulatory Visit: Payer: Self-pay | Admitting: Family Medicine

## 2021-09-18 DIAGNOSIS — F419 Anxiety disorder, unspecified: Secondary | ICD-10-CM

## 2021-10-04 ENCOUNTER — Inpatient Hospital Stay (HOSPITAL_COMMUNITY): Payer: Medicare Other | Attending: Hematology

## 2021-10-04 DIAGNOSIS — Z452 Encounter for adjustment and management of vascular access device: Secondary | ICD-10-CM | POA: Insufficient documentation

## 2021-10-04 DIAGNOSIS — C8338 Diffuse large B-cell lymphoma, lymph nodes of multiple sites: Secondary | ICD-10-CM | POA: Diagnosis not present

## 2021-10-04 MED ORDER — SODIUM CHLORIDE 0.9% FLUSH
10.0000 mL | Freq: Once | INTRAVENOUS | Status: AC
  Administered 2021-10-04: 10 mL via INTRAVENOUS

## 2021-10-04 MED ORDER — HEPARIN SOD (PORK) LOCK FLUSH 100 UNIT/ML IV SOLN
500.0000 [IU] | Freq: Once | INTRAVENOUS | Status: AC
  Administered 2021-10-04: 500 [IU] via INTRAVENOUS

## 2021-10-04 NOTE — Patient Instructions (Signed)
[facility]  Discharge Instructions: ?Thank you for choosing [facility] to provide your oncology and hematology care.  ?If you have a lab appointment with the [facility], please come in thru the Main Entrance and check in at the main information desk. ? ?Wear comfortable clothing and clothing appropriate for easy access to any Portacath or PICC line.  ? ?We strive to give you quality time with your provider. You may need to reschedule your appointment if you arrive late (15 or more minutes).  Arriving late affects you and other patients whose appointments are after yours.  Also, if you miss three or more appointments without notifying the office, you may be dismissed from the clinic at the provider?s discretion.    ?  ?For prescription refill requests, have your pharmacy contact our office and allow 72 hours for refills to be completed.   ? ?Today you received the following chemotherapy and/or immunotherapy agents [place]    ?  ?To help prevent nausea and vomiting after your treatment, we encourage you to take your nausea medication as directed. ? ?BELOW ARE SYMPTOMS THAT SHOULD BE REPORTED IMMEDIATELY: ?*FEVER GREATER THAN 100.4 F (38 ?C) OR HIGHER ?*CHILLS OR SWEATING ?*NAUSEA AND VOMITING THAT IS NOT CONTROLLED WITH YOUR NAUSEA MEDICATION ?*UNUSUAL SHORTNESS OF BREATH ?*UNUSUAL BRUISING OR BLEEDING ?*URINARY PROBLEMS (pain or burning when urinating, or frequent urination) ?*BOWEL PROBLEMS (unusual diarrhea, constipation, pain near the anus) ?TENDERNESS IN MOUTH AND THROAT WITH OR WITHOUT PRESENCE OF ULCERS (sore throat, sores in mouth, or a toothache) ?UNUSUAL RASH, SWELLING OR PAIN  ?UNUSUAL VAGINAL DISCHARGE OR ITCHING  ? ?Items with * indicate a potential emergency and should be followed up as soon as possible or go to the Emergency Department if any problems should occur. ? ?Please show the CHEMOTHERAPY ALERT CARD or IMMUNOTHERAPY ALERT CARD at check-in to the Emergency  Department and triage nurse. ? ?Should you have questions after your visit or need to cancel or reschedule your appointment, please contact [facility] [phone]  and follow the prompts.  Office hours are 8:00 a.m. to 4:30 p.m. Monday - Friday. Please note that voicemails left after 4:00 p.m. may not be returned until the following business day.  We are closed weekends and major holidays. You have access to a nurse at all times for urgent questions. Please call the main number to the clinic [phone] and follow the prompts. ? ?For any non-urgent questions, you may also contact your provider using MyChart. We now offer e-Visits for anyone 21 and older to request care online for non-urgent symptoms. For details visit [email or web address]. ?  ?Also download the MyChart app! Go to the app store, search "MyChart", open the app, select [place] [facility], and log in with your MyChart username and password. ? ?Due to Covid, a mask is required upon entering the hospital/clinic. If you do not have a mask, one will be given to you upon arrival. For doctor visits, patients may have 1 support person aged 31 or older with them. For treatment visits, patients cannot have anyone with them due to current Covid guidelines and our immunocompromised population.  ?

## 2021-10-06 ENCOUNTER — Other Ambulatory Visit: Payer: Self-pay | Admitting: Family Medicine

## 2021-10-06 DIAGNOSIS — I10 Essential (primary) hypertension: Secondary | ICD-10-CM

## 2021-12-10 ENCOUNTER — Telehealth: Payer: Self-pay | Admitting: Family Medicine

## 2021-12-10 NOTE — Telephone Encounter (Signed)
Left message for patient to call back and schedule Medicare Annual Wellness Visit (AWV) to be completed by video or phone.   Last AWV: 11/30/2020  Please schedule at anytime with [facility]  45 minute appointment  Any questions, please contact me at [phone]

## 2021-12-25 ENCOUNTER — Other Ambulatory Visit: Payer: Self-pay | Admitting: Family Medicine

## 2021-12-25 DIAGNOSIS — F419 Anxiety disorder, unspecified: Secondary | ICD-10-CM

## 2022-01-02 ENCOUNTER — Other Ambulatory Visit: Payer: Self-pay | Admitting: Family Medicine

## 2022-01-02 DIAGNOSIS — I10 Essential (primary) hypertension: Secondary | ICD-10-CM

## 2022-01-03 ENCOUNTER — Inpatient Hospital Stay (HOSPITAL_COMMUNITY): Payer: Medicare Other | Attending: Hematology

## 2022-01-03 ENCOUNTER — Ambulatory Visit (HOSPITAL_COMMUNITY)
Admission: RE | Admit: 2022-01-03 | Discharge: 2022-01-03 | Disposition: A | Discharge status: Home / Self Care | Payer: Medicare Other | Source: Ambulatory Visit | Attending: Hematology | Admitting: Hematology

## 2022-01-03 DIAGNOSIS — C8338 Diffuse large B-cell lymphoma, lymph nodes of multiple sites: Secondary | ICD-10-CM | POA: Insufficient documentation

## 2022-01-03 DIAGNOSIS — Z87891 Personal history of nicotine dependence: Secondary | ICD-10-CM | POA: Insufficient documentation

## 2022-01-03 DIAGNOSIS — Z79899 Other long term (current) drug therapy: Secondary | ICD-10-CM | POA: Insufficient documentation

## 2022-01-03 DIAGNOSIS — C8590 Non-Hodgkin lymphoma, unspecified, unspecified site: Secondary | ICD-10-CM | POA: Diagnosis not present

## 2022-01-03 DIAGNOSIS — Z86718 Personal history of other venous thrombosis and embolism: Secondary | ICD-10-CM | POA: Insufficient documentation

## 2022-01-03 LAB — CBC WITH DIFFERENTIAL/PLATELET
Abs Immature Granulocytes: 0.06 10*3/uL (ref 0.00–0.07)
Basophils Absolute: 0 10*3/uL (ref 0.0–0.1)
Basophils Relative: 0 %
Eosinophils Absolute: 0.3 10*3/uL (ref 0.0–0.5)
Eosinophils Relative: 4 %
HCT: 41 % (ref 39.0–52.0)
Hemoglobin: 14 g/dL (ref 13.0–17.0)
Immature Granulocytes: 1 %
Lymphocytes Relative: 16 %
Lymphs Abs: 1.1 10*3/uL (ref 0.7–4.0)
MCH: 32.3 pg (ref 26.0–34.0)
MCHC: 34.1 g/dL (ref 30.0–36.0)
MCV: 94.5 fL (ref 80.0–100.0)
Monocytes Absolute: 0.6 10*3/uL (ref 0.1–1.0)
Monocytes Relative: 9 %
Neutro Abs: 4.8 10*3/uL (ref 1.7–7.7)
Neutrophils Relative %: 70 %
Platelets: 183 10*3/uL (ref 150–400)
RBC: 4.34 MIL/uL (ref 4.22–5.81)
RDW: 12.5 % (ref 11.5–15.5)
WBC: 6.8 10*3/uL (ref 4.0–10.5)
nRBC: 0 % (ref 0.0–0.2)

## 2022-01-03 LAB — COMPREHENSIVE METABOLIC PANEL
ALT: 23 U/L (ref 0–44)
AST: 22 U/L (ref 15–41)
Albumin: 4 g/dL (ref 3.5–5.0)
Alkaline Phosphatase: 64 U/L (ref 38–126)
Anion gap: 6 (ref 5–15)
BUN: 24 mg/dL — ABNORMAL HIGH (ref 8–23)
CO2: 29 mmol/L (ref 22–32)
Calcium: 9.1 mg/dL (ref 8.9–10.3)
Chloride: 104 mmol/L (ref 98–111)
Creatinine, Ser: 0.95 mg/dL (ref 0.61–1.24)
GFR, Estimated: >60 mL/min (ref >60)
Glucose, Bld: 130 mg/dL — ABNORMAL HIGH (ref 70–99)
Potassium: 4.2 mmol/L (ref 3.5–5.1)
Sodium: 139 mmol/L (ref 135–145)
Total Bilirubin: 0.1 mg/dL — ABNORMAL LOW (ref 0.3–1.2)
Total Protein: 7 g/dL (ref 6.5–8.1)

## 2022-01-03 LAB — LACTATE DEHYDROGENASE: LDH: 123 U/L (ref 98–192)

## 2022-01-03 MED ORDER — FLUDEOXYGLUCOSE F - 18 (FDG) INJECTION
12.5200 | Freq: Once | INTRAVENOUS | Status: AC | PRN
  Administered 2022-01-03: 12.52 via INTRAVENOUS

## 2022-01-04 ENCOUNTER — Telehealth (HOSPITAL_COMMUNITY): Payer: Self-pay | Admitting: *Deleted

## 2022-01-04 NOTE — Telephone Encounter (Signed)
Attempted to call pt several times today due at STAT PET scan called into the clinic today. Left a VM for pt to call the clinic as soon as possible.

## 2022-01-10 ENCOUNTER — Inpatient Hospital Stay (HOSPITAL_COMMUNITY): Payer: Medicare Other

## 2022-01-10 ENCOUNTER — Encounter (HOSPITAL_COMMUNITY): Payer: Self-pay | Admitting: Hematology

## 2022-01-10 ENCOUNTER — Inpatient Hospital Stay (HOSPITAL_COMMUNITY): Payer: Medicare Other | Admitting: Hematology

## 2022-01-10 VITALS — HR 54 | Temp 99.0°F | Resp 18 | Wt 230.0 lb

## 2022-01-10 DIAGNOSIS — Z79899 Other long term (current) drug therapy: Secondary | ICD-10-CM | POA: Diagnosis not present

## 2022-01-10 DIAGNOSIS — C8338 Diffuse large B-cell lymphoma, lymph nodes of multiple sites: Secondary | ICD-10-CM

## 2022-01-10 DIAGNOSIS — Z87891 Personal history of nicotine dependence: Secondary | ICD-10-CM | POA: Diagnosis not present

## 2022-01-10 DIAGNOSIS — Z86718 Personal history of other venous thrombosis and embolism: Secondary | ICD-10-CM | POA: Diagnosis not present

## 2022-01-10 MED ORDER — SODIUM CHLORIDE 0.9% FLUSH
10.0000 mL | Freq: Once | INTRAVENOUS | Status: AC
  Administered 2022-01-10: 10 mL via INTRAVENOUS

## 2022-01-10 MED ORDER — HEPARIN SOD (PORK) LOCK FLUSH 100 UNIT/ML IV SOLN
500.0000 [IU] | Freq: Once | INTRAVENOUS | Status: AC
  Administered 2022-01-10: 500 [IU] via INTRAVENOUS

## 2022-01-10 NOTE — Patient Instructions (Addendum)
[place] at [facility] Discharge Instructions  You were seen today by Dr. Delton Coombes. He went over your recent results and scan. You will be scheduled for a PET scan prior to your next visit. Dr. Delton Coombes will see you back in 3 months for labs and follow up.   Thank you for choosing [facility] at [facility] to provide your oncology and hematology care.  To afford each patient quality time with our provider, please arrive at least 15 minutes before your scheduled appointment time.   If you have a lab appointment with the [facility] please come in thru the Main Entrance and check in at the main information desk  You need to re-schedule your appointment should you arrive 10 or more minutes late.  We strive to give you quality time with our providers, and arriving late affects you and other patients whose appointments are after yours.  Also, if you no show three or more times for appointments you may be dismissed from the clinic at the providers discretion.     Again, thank you for choosing [facility].  Our hope is that these requests will decrease the amount of time that you wait before being seen by our physicians.       _____________________________________________________________  Should you have questions after your visit to [facility], please contact our office at [phone] between the hours of 8:00 a.m. and 4:30 p.m.  Voicemails left after 4:00 p.m. will not be returned until the following business day.  For prescription refill requests, have your pharmacy contact our office and allow 72 hours.    Cancer Center Support Programs:   > Cancer Support Group  2nd Tuesday of the month 1pm-2pm, Journey Room

## 2022-01-10 NOTE — Progress Notes (Signed)
Patients port flushed without difficulty.  Good blood return noted with no bruising or swelling noted at site.  Band aid applied.  VSS with discharge and left in satisfactory condition with no s/s of distress noted.   

## 2022-01-10 NOTE — Progress Notes (Signed)
Kevin [name], [place] [id number]   CLINIC:  Medical Oncology/Hematology  PCP:  Loman Brooklyn, [place] / [place] [address] [phone]   REASON FOR VISIT:  Follow-up for high-grade DLBCL  PRIOR THERAPY: R-EPOCH x 6 cycles from 05/11/2019 to 08/23/2019  CURRENT THERAPY: Surveillance  BRIEF ONCOLOGIC HISTORY:  Oncology History  Diffuse large B-cell lymphoma of lymph nodes of multiple regions ([facility])  04/26/2019 Initial Diagnosis   Diffuse large B-cell lymphoma of lymph nodes of multiple regions ([facility])   05/10/2019 -  Chemotherapy   The patient had pegfilgrastim (NEULASTA ONPRO KIT) injection 6 mg, 6 mg, Subcutaneous, Once, 1 of 1 cycle pegfilgrastim-jmdb (FULPHILA) injection 6 mg, 6 mg, Subcutaneous,  Once, 6 of 7 cycles Administration: 6 mg (05/18/2019), 6 mg (06/07/2019), 6 mg (06/26/2019), 6 mg (07/19/2019), 6 mg (08/09/2019), 6 mg (08/30/2019) DOXOrubicin (ADRIAMYCIN) 20 mg, etoposide (VEPESID) 104 mg, vinCRIStine (ONCOVIN) 0.8 mg in sodium chloride 0.9 % 500 mL chemo infusion, , Intravenous, Once, 6 of 7 cycles Administration:  (05/11/2019),  (05/31/2019),  (06/01/2019),  (05/12/2019),  (05/13/2019),  (05/14/2019),  (06/02/2019),  (06/03/2019),  (06/20/2019),  (06/21/2019),  (06/22/2019),  (07/12/2019),  (06/23/2019),  (07/13/2019),  (07/14/2019),  (07/15/2019),  (08/02/2019),  (08/03/2019),  (08/04/2019),  (08/05/2019),  (08/23/2019),  (08/24/2019),  (08/25/2019),  (08/26/2019) ondansetron (ZOFRAN) 8 mg in sodium chloride 0.9 % 50 mL IVPB, , Intravenous,  Once, 6 of 7 cycles Administration: 8 mg (05/10/2019), 8 mg (05/11/2019), 16 mg (05/17/2019), 8 mg (06/01/2019), 16 mg (06/04/2019), 8 mg (05/12/2019), 8 mg (05/13/2019), 8 mg (05/14/2019), 8 mg (06/02/2019), 8 mg (06/03/2019), 8 mg (06/20/2019), 16 mg (06/24/2019), 8 mg (06/22/2019), 8 mg (07/12/2019), 8 mg (06/23/2019), 8 mg (07/13/2019), 16 mg (07/16/2019), 8 mg (07/14/2019), 8 mg (07/15/2019), 8 mg (08/02/2019), 8  mg (08/03/2019), 8 mg (08/04/2019), 8 mg (08/05/2019), 16 mg (08/06/2019), 8 mg (08/23/2019), 8 mg (08/24/2019), 8 mg (08/25/2019), 8 mg (08/26/2019), 16 mg (08/27/2019) cyclophosphamide (CYTOXAN) 1,560 mg in sodium chloride 0.9 % 250 mL chemo infusion, 750 mg/m2 = 1,560 mg, Intravenous,  Once, 6 of 7 cycles Administration: 1,560 mg (05/17/2019), 1,560 mg (06/04/2019), 1,560 mg (06/24/2019), 1,560 mg (07/16/2019), 1,560 mg (08/06/2019), 1,560 mg (08/27/2019) riTUXimab-pvvr (RUXIENCE) 800 mg in sodium chloride 0.9 % 250 mL (2.4242 mg/mL) infusion, 375 mg/m2 = 800 mg, Intravenous,  Once, 6 of 7 cycles Dose modification: 100 mg (original dose 375 mg/m2, Cycle 1, Reason: Other (see comments), Comment: test dose), 600 mg (original dose 375 mg/m2, Cycle 1, Reason: Other (see comments), Comment: due to previous reaction giving test dose first), 400 mg (original dose 375 mg/m2, Cycle 2, Reason: Other (see comments), Comment: remainder of day before fluid - do not bill) Administration: 800 mg (05/10/2019), 800 mg (05/31/2019), 100 mg (05/17/2019), 400 mg (06/01/2019), 800 mg (06/21/2019), 800 mg (07/12/2019), 800 mg (08/02/2019), 800 mg (08/23/2019)  for chemotherapy treatment.    05/11/2019 - 05/11/2019 Chemotherapy   The patient had DOXOrubicin (ADRIAMYCIN) chemo injection 104 mg, 50 mg/m2, Intravenous,  Once, 0 of 6 cycles palonosetron (ALOXI) injection 0.25 mg, 0.25 mg, Intravenous,  Once, 0 of 6 cycles pegfilgrastim-jmdb (FULPHILA) injection 6 mg, 6 mg, Subcutaneous,  Once, 0 of 6 cycles vinCRIStine (ONCOVIN) 2 mg in sodium chloride 0.9 % 50 mL chemo infusion, 2 mg, Intravenous,  Once, 0 of 6 cycles cyclophosphamide (CYTOXAN) 1,540 mg in sodium chloride 0.9 % 250 mL chemo infusion, 750 mg/m2, Intravenous,  Once, 0 of 6 cycles fosaprepitant (EMEND)  150 mg, dexamethasone (DECADRON) 12 mg in sodium chloride 0.9 % 145 mL IVPB, , Intravenous,  Once, 0 of 6 cycles riTUXimab-pvvr (RUXIENCE) 800 mg in sodium chloride 0.9 % 250 mL  (2.4242 mg/mL) infusion, 375 mg/m2, Intravenous,  Once, 0 of 6 cycles  for chemotherapy treatment.      CANCER STAGING: Cancer Staging  No matching staging information was found for the patient.  INTERVAL HISTORY:  Mr. KOLLIN UDELL, a 66 y.o. male, returns for routine follow-up of his high-grade DLBCL. Kevin [name] was last seen on 07/04/2021.   Today he reports feeling good. He denies recent infections. He continues to work. He reports 1 episode of abdominal pain about 1 month ago lasting 1 day before resolving and not recurring; he reports this pain was not accompanied by n/v/d, fevers, or chills. He denies current pains. He denies recent injuries on his legs. He denies skin rash.   REVIEW OF SYSTEMS:  Review of Systems  Constitutional:  Negative for appetite change, chills, fatigue and fever.  Gastrointestinal:  Negative for abdominal pain (x1), diarrhea, nausea and vomiting.  Skin:  Negative for rash.  Hematological:  Bruises/bleeds easily.  All other systems reviewed and are negative.   PAST MEDICAL/SURGICAL HISTORY:  Past Medical History:  Diagnosis Date   Anxiety    Depression    Diffuse large B cell lymphoma ([facility]) 04/26/2019   Hyperlipidemia 02/01/2014   Hypertension    Leg DVT (deep venous thromboembolism), acute, bilateral ([facility]) 04/18/2019   Port-A-Cath in place 04/27/2019   Past Surgical History:  Procedure Laterality Date   COLONOSCOPY WITH PROPOFOL N/A 12/25/2020   Procedure: COLONOSCOPY WITH PROPOFOL;  Surgeon: Daneil Dolin, MD;  [facility];  Service: Endoscopy;  Laterality: N/A;  2:15pm   CYSTOSCOPY W/ URETERAL STENT PLACEMENT Bilateral 04/21/2019   Procedure: CYSTOSCOPY WITH RETROGRADE PYELOGRAM/URETERAL STENT PLACEMENT;  Surgeon: Cleon Gustin, MD;  Location: [facility];  Service: Urology;  Laterality: Bilateral;   ETHMOIDECTOMY Right 02/04/2020   Procedure: ETHMOIDECTOMY;  Surgeon: Leta Baptist, MD;  Location: [facility];  Service: ENT;   Laterality: Right;   INGUINAL LYMPH NODE BIOPSY Right 04/21/2019   Procedure: INGUINAL LYMPH NODE BIOPSY;  Surgeon: Virl Cagey, MD;  Location: [facility];  Service: General;  Laterality: Right;   MAXILLARY ANTROSTOMY Right 02/04/2020   Procedure: MAXILLARY ANTROSTOMY WITH TISSUE REMOVAL;  Surgeon: Leta Baptist, MD;  Location: [facility];  Service: ENT;  Laterality: Right;   PORTACATH PLACEMENT Left 05/07/2019   Procedure: INSERTION PORT-A-CATH (catheter left subclavian);  Surgeon: Virl Cagey, MD;  Location: [facility];  Service: General;  Laterality: Left;    SOCIAL HISTORY:  Social History   Socioeconomic History   Marital status: Married    Spouse name: Not on file   Number of children: 3   Years of education: 10   Highest education level: Not on file  Occupational History   Not on file  Tobacco Use   Smoking status: Former    Packs/day: 3.00    Years: 30.00    Total pack years: 90.00    Types: Cigarettes    Quit date: 04/27/1999    Years since quitting: 22.7   Smokeless tobacco: Never  Vaping Use   Vaping Use: Never used  Substance and Sexual Activity   Alcohol use: Yes    Comment: daily. 2-6 beer daily   Drug use: Never   Sexual activity: Yes  Other Topics Concern   Not  on file  Social History Narrative   Not on file   Social Determinants of Health   Financial Resource Strain: Low Risk  (11/30/2020)   Overall Financial Resource Strain (CARDIA)    Difficulty of Paying Living Expenses: Not very hard  Food Insecurity: No Food Insecurity (11/30/2020)   Hunger Vital Sign    Worried About Running Out of Food in the Last Year: Never true    Ran Out of Food in the Last Year: Never true  Transportation Needs: No Transportation Needs (11/30/2020)   PRAPARE - Hydrologist (Medical): No    Lack of Transportation (Non-Medical): No  Physical Activity: Inactive (11/30/2020)   Exercise Vital Sign    Days of Exercise per Week: 0 days     Minutes of Exercise per Session: 0 min  Stress: No Stress Concern Present (11/30/2020)   [place]    Feeling of Stress : Only a little  Social Connections: Moderately Integrated (11/30/2020)   Social Connection and Isolation Panel [NHANES]    Frequency of Communication with Friends and Family: More than three times a week    Frequency of Social Gatherings with Friends and Family: More than three times a week    Attends Religious Services: 1 to 4 times per year    Active Member of [employer] or Organizations: No    Attends Archivist Meetings: Never    Marital Status: Married  Human resources officer Violence: Not At Risk (11/30/2020)   Humiliation, Afraid, Rape, and Kick questionnaire    Fear of Current or Ex-Partner: No    Emotionally Abused: No    Physically Abused: No    Sexually Abused: No    FAMILY HISTORY:  Family History  Problem Relation Age of Onset   Diabetes Brother    Diabetes Mother    Leukemia Brother    Diabetes Son    Colon cancer Neg Hx     CURRENT MEDICATIONS:  Current Outpatient Medications  Medication Sig Dispense Refill   amLODipine (NORVASC) 10 MG tablet TAKE 1 TABLET BY MOUTH EVERY DAY 90 tablet 0   aspirin EC 81 MG tablet Take 81 mg by mouth daily. Swallow whole.     escitalopram (LEXAPRO) 10 MG tablet TAKE 1 TABLET BY MOUTH EVERY DAY 90 tablet 0   lidocaine-prilocaine (EMLA) cream Apply 1 application topically as needed (port access).     LORazepam (ATIVAN) 0.5 MG tablet TAKE 1 TABLET (0.5 MG TOTAL) BY MOUTH EVERY 8 (EIGHT) HOURS. (Patient taking differently: Take 0.5 mg by mouth every 8 (eight) hours as needed for anxiety.) 60 tablet 3   losartan-hydrochlorothiazide (HYZAAR) 100-25 MG tablet Take 1 tablet by mouth daily. 90 tablet 1   metoprolol succinate (TOPROL-XL) 50 MG 24 hr tablet TAKE 1 TABLET BY MOUTH EVERY DAY WITH OR IMMEDIATELY FOLLOWING A MEAL 90 tablet 0   omega-3 acid ethyl  esters (LOVAZA) 1 g capsule Take 2 capsules (2 g total) by mouth 2 (two) times daily. 360 capsule 1   No current facility-administered medications for this visit.   Facility-Administered Medications Ordered in Other Visits  Medication Dose Route Frequency Provider Last Rate Last Admin   0.9 %  sodium chloride infusion   Intravenous Continuous Derek Jack, MD 20 mL/hr at 08/26/19 1105 New Bag at 08/26/19 1105   sodium chloride flush (NS) 0.9 % injection 10 mL  10 mL Intravenous PRN Derek Jack, MD   10 mL  at 08/26/19 1104   sodium chloride flush (NS) 0.9 % injection 10 mL  10 mL Intravenous PRN Derek Jack, MD   10 mL at 06/22/21 1107    ALLERGIES:  No Known Allergies  PHYSICAL EXAM:  Performance status (ECOG): 1 - Symptomatic but completely ambulatory  There were no vitals filed for this visit. Wt Readings from Last 3 Encounters:  07/17/21 224 lb 9.6 oz (101.9 kg)  07/04/21 230 lb 1.6 oz (104.4 kg)  06/22/21 231 lb (104.8 kg)   Physical Exam Vitals reviewed.  Constitutional:      Appearance: Normal appearance. He is obese.  Cardiovascular:     Rate and Rhythm: Normal rate and regular rhythm.     Pulses: Normal pulses.     Heart sounds: Normal heart sounds.  Pulmonary:     Effort: Pulmonary effort is normal.     Breath sounds: Normal breath sounds.  Abdominal:     Palpations: Abdomen is soft. There is no mass.     Tenderness: There is no abdominal tenderness.  Musculoskeletal:     Right lower leg: No edema.     Left lower leg: No edema.  Lymphadenopathy:     Lower Body: No right inguinal adenopathy. No left inguinal adenopathy.  Neurological:     General: No focal deficit present.     Mental Status: He is alert and oriented to person, place, and time.  Psychiatric:        Mood and Affect: Mood normal.        Behavior: Behavior normal.      LABORATORY DATA:  I have reviewed the labs as listed.     Latest Ref Rng & Units 01/03/2022    9:04  AM 06/22/2021   10:23 AM 12/07/2020    2:45 PM  CBC  WBC 4.0 - 10.5 K/uL 6.8  6.6  6.1   Hemoglobin 13.0 - 17.0 g/dL 14.0  13.0  12.8   Hematocrit 39.0 - 52.0 % 41.0  38.1  37.6   Platelets 150 - 400 K/uL 183  191  179       Latest Ref Rng & Units 01/03/2022    9:04 AM 06/22/2021   10:23 AM 12/07/2020    2:45 PM  CMP  Glucose 70 - 99 mg/dL 130  119  132   BUN 8 - 23 mg/dL $Remove'24  16  23   'hFSJMfT$ Creatinine 0.61 - 1.24 mg/dL 0.95  0.81  0.92   Sodium 135 - 145 mmol/L 139  135  140   Potassium 3.5 - 5.1 mmol/L 4.2  4.0  4.2   Chloride 98 - 111 mmol/L 104  101  105   CO2 22 - 32 mmol/L $RemoveB'29  27  28   'MdHmokdd$ Calcium 8.9 - 10.3 mg/dL 9.1  8.5  9.1   Total Protein 6.5 - 8.1 g/dL 7.0  6.1  6.2   Total Bilirubin 0.3 - 1.2 mg/dL 0.1  0.4  0.7   Alkaline Phos 38 - 126 U/L 64  67  65   AST 15 - 41 U/L $Remo'22  18  17   'tcnJm$ ALT 0 - 44 U/L $Remo'23  19  18     'KuIDD$ DIAGNOSTIC IMAGING:  I have independently reviewed the scans and discussed with the patient. NM PET Image Restag (PS) Skull Base To Thigh  Result Date: 01/04/2022 CLINICAL DATA:  Subsequent treatment strategy for lymphoma. EXAM: NUCLEAR MEDICINE PET SKULL BASE TO THIGH TECHNIQUE: 12.5 mCi F-18 FDG was injected  intravenously. Full-ring PET imaging was performed from the skull base to thigh after the radiotracer. CT data was obtained and used for attenuation correction and anatomic localization. Fasting blood glucose: 134 mg/dl COMPARISON:  Multiple exams, including 06/28/2021 FINDINGS: Mediastinal blood pool activity: SUV max 2.8 Liver activity: SUV max 3.8 NECK: No significant abnormal hypermetabolic activity in this region. Incidental CT findings: Chronic mucoperiosteal thickening the right maxillary sinus prior right maxillary antrostomy. Bilateral common carotid atherosclerotic calcification. CHEST: 1.3 by 0.5 cm right paraspinal/pleural soft tissue density at about the upper T8 vertebral level on image 113 of series 3, maximum SUV 3.6 (Deauville 3). A similar structure in this  vicinity on 06/28/2021 measured 0.8 by 0.4 cm and had a maximum SUV of 1.9. Possibilities in this location include pleural lymph node or less likely extramedullary hematopoiesis. Incidental CT findings: Left Port-A-Cath tip: SVC. Coronary, aortic arch, and branch vessel atherosclerotic vascular disease. Mild calcification of the mitral valve. Mild cardiomegaly. ABDOMEN/PELVIS: Similar appearance of an infiltrative stranding at the root of the mesentery favoring treated lymphoma, maximum SUV 3.2 (Deauville 3), formerly measured at 3.9. Previously seen small lymph node along the upper lateral margin of the subcutaneous course of the penile tissues, 0.9 cm in short axis on image 258 of series 3 with maximum SUV of 5.3 (Deauville 4), previously this measured 1.3 cm in short axis with maximum SUV of 4.7. Left inguinal node 0.9 cm in short axis on image 277 series 3 with maximum SUV 5.4 (Deauville 4), previously measuring 0.7 cm with maximum SUV of 1.2. Right inguinal lymph node 0.9 cm in short axis on image 280 of series 3 with maximum SUV 7.5 (Deauville 4), formerly 0.7 cm in short axis with maximum SUV of 1.6. Incidental CT findings: Atherosclerosis is present, including aortoiliac atherosclerotic disease. 1.9 by 1.9 cm left adrenal mass with internal density -14 Hounsfield units compatible with adrenal adenoma. Abnormally thickened appearance of the appendix 1.2 cm on image 207 series 3, this segment of the appendix previously measured 0.5 cm on 06/28/2021. No substantial accentuated appendiceal FDG activity. Possibilities may include appendiceal inflammation or new mucocele. The distal tip of the appendix appears of normal caliber. The possibility that this apparently thickened segment of the appendix simply represents a contracted portion of the cecum is not totally discounted but seems unlikely given the similar course of the appendix relative to the prior exam. Minimal periappendiceal inflammatory stranding.  SKELETON: No significant abnormal hypermetabolic activity in this region. 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 Prominence of epidural adipose tissues in the lower lumbar spine region. IMPRESSION: 1. New appendiceal thickening up to 1.2 cm in diameter, with mild periappendiceal stranding but no accentuated metabolic activity in the appendix. Appearance suspicious for acute appendicitis, correlate with patient's symptoms. 2. Mixed appearance but primarily worsened findings of lymphoma, with newly hypermetabolic bilateral right inguinal lymph nodes (Deauville 4) and a newly mildly hypermetabolic right pleural lymph node adjacent to the upper T8 vertebral body (Deauville 3). The hazy central mesenteric stranding compatible with treated lymphoma is still Deauville 3 but has a slightly improved SUV at 3.2 (formerly 3.9). 3. The previously seen lymph node along the left upper lateral margin of the subcutaneous course of the penis has a maximum SUV of 5.3 (Deauville 4), formerly 4.7. 4. Other imaging findings of potential clinical significance: Chronic right maxillary sinusitis. Aortic Atherosclerosis (ICD10-I70.0). Coronary and systemic atherosclerosis. Mild cardiomegaly. Mild calcification of the mitral  valve. Left adrenal adenoma. 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 A summary of these results will be called to the ordering clinician or representative by the Radiologist Assistant, and communication documented in the PACS or [employer]. Electronically Signed   By: Van Clines M.D.   On: 01/04/2022 09:26     ASSESSMENT:  1.  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 -6 cycles of R-EPOCH from 05/11/2019  through 08/23/2019. -PET scan on 09/17/2019 showed central abdominal mass with SUV 2.9, measuring 10 x 3.7 cm.  Mildly enlarged right pelvic sidewall lymph node 12 mm SUV 1.7.  Areas of multifocal bone involvement with near complete resolution.  Deauville 2/3 in the five-point scale. -He was evaluated by lymphoma specialist Dr. Cassell Clement at the request [facility].  His case was discussed at tumor board.  No adjuvant radiation therapy or maintenance rituximab or transplant was recommended at this time. -PET scan on 12/14/2019 shows soft tissue and nodularity at the root and within the small bowel mesentery, grossly similar measuring 4.2 x 11.2 cm with SUV max 3.6, similar to 3.2 previously.  External iliac and inguinal lymph nodes do not show metabolic some low blood pool.  SUV 2.3. -Enlarging rounded soft tissue in the right maxillary sinus with periosteal thickening and mild hypermetabolic zone. -PET scan on 03/27/2020 shows confluent soft tissue density in the central small bowel mesentery 11.6 x 4.2 cm with no significant change in size.  SUV 3.0, previously 3.6.  Small left para-aortic lymph node 1 cm with no FDG uptake.  Right external iliac lymph node, 10 mm with SUV 1.8, previously 2.3.  No new or progressive areas.     2.  Bilateral leg DVT: -Ultrasound Doppler on 04/18/2019 showed bilateral leg DVT. -He is on Eliquis since then. -Repeat Doppler on 12/29/2019 shows previous extensive right lower extremity DVT has resolved with minor residual nonocclusive right peroneal thrombus noted.  Very minimal residual thrombus burden.  Negative left leg DVT. - Repeat Dopplers negative on 04/03/2020.  Eliquis was discontinued.   3.  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    PLAN:  1.  Double hit lymphoma: - He does not report any fevers, night sweats or weight loss.  Does not report any  infections.  He is working full-time job. - He reported right lower quadrant abdominal pain for 1 day within the last 1 month.  Denies any pain at this time.  No nausea or vomiting. - Reviewed labs which shows normal LFTs and LDH.  CBC was normal. - PET scan on 01/03/2022: New appendiceal thickening 1.2 cm with mild periappendiceal stranding but no accentuated metabolic activity.  He does not have any symptoms of right lower quadrant pain or tenderness at this time. - New hypermetabolic bilateral right inguinal lymph nodes and newly mildly hypermetabolic right pleural lymph node adjacent to the upper T8 vertebral body.  Hazy central mesenteric stranding is still Deauville 3 but slightly improved SUV 3.2, formally 3.9.  Previously seen lymph node along the left upper lateral margin of the subcutaneous course of the penis SUV 5.3, formally 4.7. - Because of the subtle findings, we discussed options including scan in short interval of 3 months versus proceeding  with biopsy of the inguinal lymph nodes. - Upon discussion, we have decided to repeat scan in 3 months with labs.      Orders placed this encounter:  No orders of the defined types were placed in this encounter.    Derek Jack, MD [facility] [phone]   I, Thana Ates, am acting as a scribe for Dr. Derek Jack.  I, Derek Jack MD, have reviewed the above documentation for accuracy and completeness, and I agree with the above.

## 2022-01-10 NOTE — Patient Instructions (Signed)
[facility]  Discharge Instructions: Thank you for choosing [facility] to provide your oncology and hematology care.  If you have a lab appointment with the Cancer Center, please come in thru the Main Entrance and check in at the main information desk.  Wear comfortable clothing and clothing appropriate for easy access to any Portacath or PICC line.   We strive to give you quality time with your provider. You may need to reschedule your appointment if you arrive late (15 or more minutes).  Arriving late affects you and other patients whose appointments are after yours.  Also, if you miss three or more appointments without notifying the office, you may be dismissed from the clinic at the provider's discretion.      For prescription refill requests, have your pharmacy contact our office and allow 72 hours for refills to be completed.         To help prevent nausea and vomiting after your treatment, we encourage you to take your nausea medication as directed.  BELOW ARE SYMPTOMS THAT SHOULD BE REPORTED IMMEDIATELY: *FEVER GREATER THAN 100.4 F (38 C) OR HIGHER *CHILLS OR SWEATING *NAUSEA AND VOMITING THAT IS NOT CONTROLLED WITH YOUR NAUSEA MEDICATION *UNUSUAL SHORTNESS OF BREATH *UNUSUAL BRUISING OR BLEEDING *URINARY PROBLEMS (pain or burning when urinating, or frequent urination) *BOWEL PROBLEMS (unusual diarrhea, constipation, pain near the anus) TENDERNESS IN MOUTH AND THROAT WITH OR WITHOUT PRESENCE OF ULCERS (sore throat, sores in mouth, or a toothache) UNUSUAL RASH, SWELLING OR PAIN  UNUSUAL VAGINAL DISCHARGE OR ITCHING   Items with * indicate a potential emergency and should be followed up as soon as possible or go to the Emergency Department if any problems should occur.  Please show the CHEMOTHERAPY ALERT CARD or IMMUNOTHERAPY ALERT CARD at check-in to the Emergency Department and triage nurse.  Should you have questions after your visit or need to  cancel or reschedule your appointment, please contact  CANCER CENTER [phone]  and follow the prompts.  Office hours are 8:00 a.m. to 4:30 p.m. Monday - Friday. Please note that voicemails left after 4:00 p.m. may not be returned until the following business day.  We are closed weekends and major holidays. You have access to a nurse at all times for urgent questions. Please call the main number to the clinic [phone] and follow the prompts.  For any non-urgent questions, you may also contact your provider using MyChart. We now offer e-Visits for anyone 18 and older to request care online for non-urgent symptoms. For details visit [email or web address].   Also download the MyChart app! Go to the app store, search "MyChart", open the app, select , and log in with your MyChart username and password.  Masks are optional in the cancer centers. If you would like for your care team to wear a mask while they are taking care of you, please let them know. For doctor visits, patients may have with them one support person who is at least 67 years old. At this time, visitors are not allowed in the infusion area.  

## 2022-01-11 ENCOUNTER — Ambulatory Visit (INDEPENDENT_AMBULATORY_CARE_PROVIDER_SITE_OTHER): Payer: Medicare Other | Admitting: Family Medicine

## 2022-01-11 ENCOUNTER — Encounter: Payer: Self-pay | Admitting: Family Medicine

## 2022-01-11 VITALS — BP 159/72 | HR 83 | Temp 97.8°F | Ht 66.0 in | Wt 230.8 lb

## 2022-01-11 DIAGNOSIS — Z0001 Encounter for general adult medical examination with abnormal findings: Secondary | ICD-10-CM | POA: Diagnosis not present

## 2022-01-11 DIAGNOSIS — C8338 Diffuse large B-cell lymphoma, lymph nodes of multiple sites: Secondary | ICD-10-CM

## 2022-01-11 DIAGNOSIS — E781 Pure hyperglyceridemia: Secondary | ICD-10-CM | POA: Diagnosis not present

## 2022-01-11 DIAGNOSIS — I10 Essential (primary) hypertension: Secondary | ICD-10-CM

## 2022-01-11 DIAGNOSIS — Z0000 Encounter for general adult medical examination without abnormal findings: Secondary | ICD-10-CM

## 2022-01-11 DIAGNOSIS — F419 Anxiety disorder, unspecified: Secondary | ICD-10-CM

## 2022-01-11 MED ORDER — LOSARTAN POTASSIUM-HCTZ 100-25 MG PO TABS: 1.0000 | ORAL_TABLET | Freq: Every day | ORAL | 1 refills | Status: DC

## 2022-01-11 MED ORDER — OMEGA-3-ACID ETHYL ESTERS 1 G PO CAPS
2.0000 | ORAL_CAPSULE | Freq: Two times a day (BID) | ORAL | 1 refills | Status: DC
Start: 2022-01-11 — End: 2023-01-09

## 2022-01-11 NOTE — Progress Notes (Signed)
Assessment & Plan:  Well adult exam Discussed health benefits of physical activity, and encouraged him to engage in regular exercise appropriate for his age and condition. Preventive health education provided. Declined COVID vaccines, TDaP, and Shingrix.   Immunization History  Administered Date(s) Administered   Fluad Quad(high Dose 65+) 06/08/2020, 07/17/2021   Influenza,inj,Quad PF,6+ Mos 06/04/2016, 08/27/2017, 04/18/2019   Influenza-Unspecified 07/24/2015   Pneumococcal Conjugate-13 12/09/2019   Pneumococcal Polysaccharide-23 02/07/2015, 01/11/2021   Tdap 05/23/2010   Health Maintenance  Topic Date Due   COVID-19 Vaccine (1) 01/27/2022 (Originally 03/31/1956)   Zoster Vaccines- Shingrix (1 of 2) 04/13/2022 (Originally 09/27/1974)   TETANUS/TDAP  01/12/2023 (Originally 05/23/2020)   INFLUENZA VACCINE  01/29/2022   COLONOSCOPY (Pts 45-24yr Insurance coverage will need to be confirmed)  12/26/2030   Pneumonia Vaccine 66 Years old  Completed   Hepatitis C Screening  Completed   HPV VACCINES  Aged Out   - PSA, total and free; Future  2. Essential hypertension Elevated [name]. Recommended he monitor his blood pressure at home, keep a log, and bring it back with him to his next visit for possible adjustment of medication. - losartan-hydrochlorothiazide (HYZAAR) 100-25 MG tablet; Take 1 tablet by mouth daily.  Dispense: 90 tablet; Refill: 1 - Lipid panel; Future  3. Hypertriglyceridemia Previously uncontrolled. Labs to reassess. - omega-3 acid ethyl esters (LOVAZA) 1 g capsule; Take 2 capsules (2 g total) by mouth 2 (two) times daily.  Dispense: 360 capsule; Refill: 1 - Lipid panel; Future  4. Diffuse large B-cell lymphoma of lymph nodes of multiple regions ([facility] Managed by oncology.  5. Anxiety Well controlled on current regimen.   Patient will get him lab work completed at the same time as his labs for oncology.    Follow-up: Return in about 6 weeks (around 02/22/2022)  for HTN.   BHendricks Limes MSN, APRN, FNP-C [facility]  Subjective:  Patient ID: Kevin [name]    DOB: [birth year] Age: 66y.o. MRN: [id number] Patient Care Team: JLoman Brooklyn FNP as PCP - General (Family Medicine) RGala RomneyRCristopher Estimable MD as Consulting Physician (Gastroenterology) KDerek Jack MD as Consulting Physician (Hematology)   CC:  Chief Complaint  Patient presents with   Annual Exam    HPI Kevin [name] a 66y.o. male who presents [name] for a complete physical exam. He reports consuming a general diet. The patient does not participate in regular exercise at present. He generally feels well. He reports sleeping well. He does not have additional problems to discuss [name].   Vision:Not within last year Dental:Receives regular dental care PQMG:NOIBBCWUcancer screening and PSA options (with potential risks and benefits of testing vs. not testing) were discussed along with recent recs/guidelines. Agrees to PSA testing, and No results found for: "PSA1", "PSA"   Advanced Directives Patient does not have advanced directives including DNR, living will, healthcare power of attorney, financial power of attorney, and MOST form.   DEPRESSION SCREENING    01/11/2022    2:22 PM 07/17/2021    3:43 PM 11/30/2020    4:29 PM 11/30/2020    4:24 PM 11/08/2020    4:51 PM 09/07/2020    4:21 PM 06/08/2020    4:08 PM  PHQ 2/9 Scores  PHQ - 2 Score 0 0 0 0 0 0 0  PHQ- 9 Score 0 0   0 0    Hypertension: He is taking amlodipine 10 mg daily, metoprolol 50 mg daily,  and losartan-HCTZ 100-25 daily.  He does not check his blood pressure at home.  He does eat low-salt.  He is not doing any exercise.   Hypertriglyceridemia: taking Lovaza twice daily.   Anxiety: taking Lexapro 10 mg daily and Ativan 0.5 mg (prescribed by oncology) as needed.     01/11/2022    2:22 PM 07/17/2021    3:43 PM 11/08/2020    4:51 PM 09/07/2020    4:22 PM  GAD 7 : Generalized  Anxiety Score  Nervous, Anxious, on Edge 0 0 0 0  Control/stop worrying 0 0 0 0  Worry too much - different things 0 0 0 0  Trouble relaxing 0 0 0 0  Restless 0 0 0 0  Easily annoyed or irritable 0 0 0 0  Afraid - awful might happen 0 0 0 0  Total GAD 7 Score 0 0 0 0  Anxiety Difficulty Not difficult at all Not difficult at all Not difficult at all Not difficult at all    Lymphoma: managed by oncology who he last saw yesterday.  Review of Systems  Constitutional:  Negative for chills, fever, malaise/fatigue and weight loss.  HENT:  Negative for congestion, ear discharge, ear pain, nosebleeds, sinus pain, sore throat and tinnitus.   Eyes:  Negative for blurred vision, double vision, pain, discharge and redness.  Respiratory:  Negative for cough, shortness of breath and wheezing.   Cardiovascular:  Negative for chest pain, palpitations and leg swelling.  Gastrointestinal:  Negative for abdominal pain, constipation, diarrhea, heartburn, nausea and vomiting.  Genitourinary:  Negative for dysuria, frequency and urgency.       Denies trouble initiating a urine stream, weak stream, split stream, nocturia, and dribbling.   Musculoskeletal:  Negative for myalgias.  Skin:  Negative for rash.  Neurological:  Negative for dizziness, seizures, weakness and headaches.  Psychiatric/Behavioral:  Negative for depression, substance abuse and suicidal ideas. The patient is not nervous/anxious.      Current Outpatient Medications:    amLODipine (NORVASC) 10 MG tablet, TAKE 1 TABLET BY MOUTH EVERY DAY, Disp: 90 tablet, Rfl: 0   aspirin EC 81 MG tablet, Take 81 mg by mouth daily. Swallow whole., Disp: , Rfl:    escitalopram (LEXAPRO) 10 MG tablet, TAKE 1 TABLET BY MOUTH EVERY DAY, Disp: 90 tablet, Rfl: 0   lidocaine-prilocaine (EMLA) cream, Apply 1 application topically as needed (port access)., Disp: , Rfl:    LORazepam (ATIVAN) 0.5 MG tablet, TAKE 1 TABLET (0.5 MG TOTAL) BY MOUTH EVERY 8 (EIGHT)  HOURS. (Patient taking differently: Take 0.5 mg by mouth every 8 (eight) hours as needed for anxiety.), Disp: 60 tablet, Rfl: 3   losartan-hydrochlorothiazide (HYZAAR) 100-25 MG tablet, Take 1 tablet by mouth daily., Disp: 90 tablet, Rfl: 1   metoprolol succinate (TOPROL-XL) 50 MG 24 hr tablet, TAKE 1 TABLET BY MOUTH EVERY DAY WITH OR IMMEDIATELY FOLLOWING A MEAL, Disp: 90 tablet, Rfl: 0   omega-3 acid ethyl esters (LOVAZA) 1 g capsule, Take 2 capsules (2 g total) by mouth 2 (two) times daily., Disp: 360 capsule, Rfl: 1 No current facility-administered medications for this visit.  Facility-Administered Medications Ordered in Other Visits:    0.9 %  sodium chloride infusion, , Intravenous, Continuous, Derek Jack, MD, Last Rate: 20 mL/hr at 08/26/19 1105, New Bag at 08/26/19 1105   sodium chloride flush (NS) 0.9 % injection 10 mL, 10 mL, Intravenous, PRN, Derek Jack, MD, 10 mL at 08/26/19 1104   sodium chloride flush (  NS) 0.9 % injection 10 mL, 10 mL, Intravenous, PRN, Derek Jack, MD, 10 mL at 06/22/21 1107  No Known Allergies  Past Medical History:  Diagnosis Date   Anxiety    Depression    Diffuse large B cell lymphoma ([place]) 04/26/2019   Hyperlipidemia 02/01/2014   Hypertension    Leg DVT (deep venous thromboembolism), acute, bilateral ([place]) 04/18/2019   Port-A-Cath in place 04/27/2019    Past Surgical History:  Procedure Laterality Date   COLONOSCOPY WITH PROPOFOL N/A 12/25/2020   Procedure: COLONOSCOPY WITH PROPOFOL;  Surgeon: Daneil Dolin, MD;  [facility];  Service: Endoscopy;  Laterality: N/A;  2:15pm   CYSTOSCOPY W/ URETERAL STENT PLACEMENT Bilateral 04/21/2019   Procedure: CYSTOSCOPY WITH RETROGRADE PYELOGRAM/URETERAL STENT PLACEMENT;  Surgeon: Cleon Gustin, MD;  Location: [facility];  Service: Urology;  Laterality: Bilateral;   ETHMOIDECTOMY Right 02/04/2020   Procedure: ETHMOIDECTOMY;  Surgeon: Leta Baptist, MD;  Location: [place];  Service: ENT;  Laterality: Right;   INGUINAL LYMPH NODE BIOPSY Right 04/21/2019   Procedure: INGUINAL LYMPH NODE BIOPSY;  Surgeon: Virl Cagey, MD;  Location: [facility];  Service: General;  Laterality: Right;   MAXILLARY ANTROSTOMY Right 02/04/2020   Procedure: MAXILLARY ANTROSTOMY WITH TISSUE REMOVAL;  Surgeon: Leta Baptist, MD;  Location: [place];  Service: ENT;  Laterality: Right;   PORTACATH PLACEMENT Left 05/07/2019   Procedure: INSERTION PORT-A-CATH (catheter left subclavian);  Surgeon: Virl Cagey, MD;  Location: [facility];  Service: General;  Laterality: Left;    Family History  Problem Relation Age of Onset   Diabetes Brother    Diabetes Mother    Leukemia Brother    Diabetes Son    Colon cancer Neg Hx     Social History   Socioeconomic History   Marital status: Married    Spouse name: Not on file   Number of children: 3   Years of education: 10   Highest education level: Not on file  Occupational History   Not on file  Tobacco Use   Smoking status: Former    Packs/day: 3.00    Years: 30.00    Total pack years: 90.00    Types: Cigarettes    Quit date: 04/27/1999    Years since quitting: 22.7   Smokeless tobacco: Never  Vaping Use   Vaping Use: Never used  Substance and Sexual Activity   Alcohol use: Yes    Comment: daily. 2-6 beer daily   Drug use: Never   Sexual activity: Yes  Other Topics Concern   Not on file  Social History Narrative   Not on file   Social Determinants of Health   Financial Resource Strain: Low Risk  (11/30/2020)   Overall Financial Resource Strain (CARDIA)    Difficulty of Paying Living Expenses: Not very hard  Food Insecurity: No Food Insecurity (11/30/2020)   Hunger Vital Sign    Worried About Running Out of Food in the Last Year: Never true    Ran Out of Food in the Last Year: Never true  Transportation Needs: No Transportation Needs (11/30/2020)   PRAPARE - Radiographer, therapeutic (Medical): No    Lack of Transportation (Non-Medical): No  Physical Activity: Inactive (11/30/2020)   Exercise Vital Sign    Days of Exercise per Week: 0 days    Minutes of Exercise per Session: 0 min  Stress: No Stress Concern Present (11/30/2020)   [employer]  of Occupational Health - Occupational Stress Questionnaire    Feeling of Stress : Only a little  Social Connections: Moderately Integrated (11/30/2020)   Social Connection and Isolation Panel [NHANES]    Frequency of Communication with Friends and Family: More than three times a week    Frequency of Social Gatherings with Friends and Family: More than three times a week    Attends Religious Services: 1 to 4 times per year    Active Member of [employer] or Organizations: No    Attends Archivist Meetings: Never    Marital Status: Married  Human resources officer Violence: Not At Risk (11/30/2020)   Humiliation, Afraid, Rape, and Kick questionnaire    Fear of Current or Ex-Partner: No    Emotionally Abused: No    Physically Abused: No    Sexually Abused: No      Objective:    BP (!) 159/72   Pulse 83   Temp 97.8 F (36.6 C) (Temporal)   Ht '5\' 6"'$  (1.676 m)   Wt 230 lb 12.8 oz (104.7 kg)   SpO2 92%   BMI 37.25 kg/m   BP Readings from Last 3 Encounters:  01/11/22 (!) 159/72  10/04/21 (!) 160/59  07/17/21 (!) 146/65   Wt Readings from Last 3 Encounters:  01/11/22 230 lb 12.8 oz (104.7 kg)  01/10/22 230 lb (104.3 kg)  07/17/21 224 lb 9.6 oz (101.9 kg)    Physical Exam Vitals reviewed.  Constitutional:      General: He is not in acute distress.    Appearance: Normal appearance. He is obese. He is not ill-appearing, toxic-appearing or diaphoretic.  HENT:     Head: Normocephalic and atraumatic.     Right Ear: Tympanic membrane, ear canal and external ear normal. There is no impacted cerumen.     Left Ear: Tympanic membrane, ear canal and external ear normal. There is no impacted cerumen.     Nose:  Nose normal. No congestion or rhinorrhea.     Mouth/Throat:     Mouth: Mucous membranes are moist.     Pharynx: Oropharynx is clear. No oropharyngeal exudate or posterior oropharyngeal erythema.  Eyes:     General: No scleral icterus.       Right eye: No discharge.        Left eye: No discharge.     Conjunctiva/sclera: Conjunctivae normal.     Pupils: Pupils are equal, round, and reactive to light.  Neck:     Vascular: No carotid bruit.  Cardiovascular:     Rate and Rhythm: Normal rate and regular rhythm.     Heart sounds: Normal heart sounds. No murmur heard.    No friction rub. No gallop.  Pulmonary:     Effort: Pulmonary effort is normal. No respiratory distress.     Breath sounds: Normal breath sounds. No stridor. No wheezing, rhonchi or rales.  Abdominal:     General: Abdomen is flat. Bowel sounds are normal. There is no distension.     Palpations: Abdomen is soft. There is no hepatomegaly, splenomegaly or mass.     Tenderness: There is no abdominal tenderness. There is no guarding or rebound.     Hernia: No hernia is present.  Musculoskeletal:        General: Normal range of motion.     Cervical back: Normal range of motion and neck supple. No rigidity. No muscular tenderness.     Right lower leg: No edema.     Left lower leg:  No edema.  Lymphadenopathy:     Cervical: No cervical adenopathy.  Skin:    General: Skin is warm and dry.     Capillary Refill: Capillary refill takes less than 2 seconds.  Neurological:     General: No focal deficit present.     Mental Status: He is alert and oriented to person, place, and time. Mental status is at baseline.  Psychiatric:        Mood and Affect: Mood normal.        Behavior: Behavior normal.        Thought Content: Thought content normal.        Judgment: Judgment normal.     Lab Results  Component Value Date   TSH 1.240 06/08/2020   Lab Results  Component Value Date   WBC 6.8 01/03/2022   HGB 14.0 01/03/2022   HCT  41.0 01/03/2022   MCV 94.5 01/03/2022   PLT 183 01/03/2022   Lab Results  Component Value Date   NA 139 01/03/2022   K 4.2 01/03/2022   CO2 29 01/03/2022   GLUCOSE 130 (H) 01/03/2022   BUN 24 (H) 01/03/2022   CREATININE 0.95 01/03/2022   BILITOT 0.1 (L) 01/03/2022   ALKPHOS 64 01/03/2022   AST 22 01/03/2022   ALT 23 01/03/2022   PROT 7.0 01/03/2022   ALBUMIN 4.0 01/03/2022   CALCIUM 9.1 01/03/2022   ANIONGAP 6 01/03/2022   Lab Results  Component Value Date   CHOL 193 07/17/2021   Lab Results  Component Value Date   HDL 55 07/17/2021   Lab Results  Component Value Date   LDLCALC 112 (H) 07/17/2021   Lab Results  Component Value Date   TRIG 146 07/17/2021   Lab Results  Component Value Date   CHOLHDL 3.5 07/17/2021   Lab Results  Component Value Date   HGBA1C 4.8 07/17/2021

## 2022-01-15 ENCOUNTER — Encounter: Payer: Medicare Other | Admitting: Family Medicine

## 2022-01-17 ENCOUNTER — Other Ambulatory Visit: Payer: Self-pay | Admitting: Family Medicine

## 2022-01-21 ENCOUNTER — Other Ambulatory Visit: Payer: Self-pay

## 2022-01-22 ENCOUNTER — Other Ambulatory Visit: Payer: Self-pay

## 2022-02-26 ENCOUNTER — Ambulatory Visit (INDEPENDENT_AMBULATORY_CARE_PROVIDER_SITE_OTHER): Payer: Medicare Other | Admitting: Family Medicine

## 2022-02-26 ENCOUNTER — Encounter: Payer: Self-pay | Admitting: Family Medicine

## 2022-02-26 VITALS — BP 145/66 | HR 64 | Temp 96.7°F | Ht 66.0 in | Wt 231.8 lb

## 2022-02-26 DIAGNOSIS — I10 Essential (primary) hypertension: Secondary | ICD-10-CM

## 2022-02-26 NOTE — Progress Notes (Signed)
Assessment & Plan:  1. Essential hypertension Well controlled on current regimen. Patient's home cuff is accurate when compared to ours.    Follow up plan: Return in about 6 months (around 08/29/2022) for follow-up of chronic medication conditions.  Hendricks Limes, MSN, APRN, FNP-C [facility]  Subjective:   Patient ID: Kevin [name], male    DOB: [birth year], 66 y.o.   MRN: [id number]  HPI: Kevin [name] is a 66 y.o. male presenting on 02/26/2022 for Hypertension (6 week follow up )  Patient is here to follow-up on his blood pressure. He has been keeping a log at home. Systolic ranges 814-481 with 1/15 > 140 today as he had not yet taken his medication; he did take it just 20 minutes ago. Diastolic ranges 85-63. He did bring his blood pressure cuff to the office today to compare for accuracy.    ROS: Negative unless specifically indicated above in HPI.   Relevant past medical history reviewed and updated as indicated.   Allergies and medications reviewed and updated.   Current Outpatient Medications:    amLODipine (NORVASC) 10 MG tablet, TAKE 1 TABLET BY MOUTH EVERY DAY, Disp: 90 tablet, Rfl: 0   aspirin EC 81 MG tablet, Take 81 mg by mouth daily. Swallow whole., Disp: , Rfl:    escitalopram (LEXAPRO) 10 MG tablet, TAKE 1 TABLET BY MOUTH EVERY DAY, Disp: 90 tablet, Rfl: 0   lidocaine-prilocaine (EMLA) cream, Apply 1 application topically as needed (port access)., Disp: , Rfl:    LORazepam (ATIVAN) 0.5 MG tablet, TAKE 1 TABLET (0.5 MG TOTAL) BY MOUTH EVERY 8 (EIGHT) HOURS. (Patient taking differently: Take 0.5 mg by mouth every 8 (eight) hours as needed for anxiety.), Disp: 60 tablet, Rfl: 3   losartan-hydrochlorothiazide (HYZAAR) 100-25 MG tablet, Take 1 tablet by mouth daily., Disp: 90 tablet, Rfl: 1   metoprolol succinate (TOPROL-XL) 50 MG 24 hr tablet, TAKE 1 TABLET BY MOUTH EVERY DAY WITH OR IMMEDIATELY FOLLOWING A MEAL, Disp: 90 tablet, Rfl: 0    omega-3 acid ethyl esters (LOVAZA) 1 g capsule, Take 2 capsules (2 g total) by mouth 2 (two) times daily., Disp: 360 capsule, Rfl: 1 No current facility-administered medications for this visit.  Facility-Administered Medications Ordered in Other Visits:    0.9 %  sodium chloride infusion, , Intravenous, Continuous, Derek Jack, MD, Last Rate: 20 mL/hr at 08/26/19 1105, New Bag at 08/26/19 1105   sodium chloride flush (NS) 0.9 % injection 10 mL, 10 mL, Intravenous, PRN, Derek Jack, MD, 10 mL at 08/26/19 1104   sodium chloride flush (NS) 0.9 % injection 10 mL, 10 mL, Intravenous, PRN, Derek Jack, MD, 10 mL at 06/22/21 1107  No Known Allergies  Objective:   BP (!) 145/66   Pulse 64   Temp (!) 96.7 F (35.9 C) (Temporal)   Ht '5\' 6"'$  (1.676 m)   Wt 231 lb 12.8 oz (105.1 kg)   SpO2 95%   BMI 37.41 kg/m    Physical Exam Vitals reviewed.  Constitutional:      General: He is not in acute distress.    Appearance: Normal appearance. He is not ill-appearing, toxic-appearing or diaphoretic.  HENT:     Head: Normocephalic and atraumatic.  Eyes:     General: No scleral icterus.       Right eye: No discharge.        Left eye: No discharge.     Conjunctiva/sclera: Conjunctivae normal.  Cardiovascular:  Rate and Rhythm: Normal rate and regular rhythm.     Heart sounds: Normal heart sounds. No murmur heard.    No friction rub. No gallop.  Pulmonary:     Effort: Pulmonary effort is normal. No respiratory distress.     Breath sounds: Normal breath sounds. No stridor. No wheezing, rhonchi or rales.  Musculoskeletal:        General: Normal range of motion.     Cervical back: Normal range of motion.  Skin:    General: Skin is warm and dry.  Neurological:     Mental Status: He is alert and oriented to person, place, and time. Mental status is at baseline.  Psychiatric:        Mood and Affect: Mood normal.        Behavior: Behavior normal.        Thought  Content: Thought content normal.        Judgment: Judgment normal.

## 2022-02-27 ENCOUNTER — Other Ambulatory Visit: Payer: Self-pay

## 2022-03-04 ENCOUNTER — Other Ambulatory Visit: Payer: Self-pay | Admitting: Hematology

## 2022-04-08 ENCOUNTER — Other Ambulatory Visit: Payer: Self-pay | Admitting: Family Medicine

## 2022-04-08 DIAGNOSIS — I10 Essential (primary) hypertension: Secondary | ICD-10-CM

## 2022-04-11 ENCOUNTER — Other Ambulatory Visit (HOSPITAL_COMMUNITY): Payer: Medicare Other

## 2022-04-11 ENCOUNTER — Encounter (HOSPITAL_COMMUNITY): Payer: Self-pay

## 2022-04-11 ENCOUNTER — Encounter (HOSPITAL_COMMUNITY): Admission: RE | Admit: 2022-04-11 | Payer: Medicare Other | Source: Ambulatory Visit

## 2022-04-11 ENCOUNTER — Ambulatory Visit (HOSPITAL_COMMUNITY)
Admission: RE | Admit: 2022-04-11 | Discharge: 2022-04-11 | Disposition: A | Discharge status: Home / Self Care | Payer: Medicare Other | Source: Ambulatory Visit | Attending: Hematology | Admitting: Hematology

## 2022-04-11 ENCOUNTER — Inpatient Hospital Stay: Payer: Medicare Other | Attending: Hematology

## 2022-04-11 DIAGNOSIS — Z79899 Other long term (current) drug therapy: Secondary | ICD-10-CM | POA: Insufficient documentation

## 2022-04-11 DIAGNOSIS — Z86718 Personal history of other venous thrombosis and embolism: Secondary | ICD-10-CM | POA: Insufficient documentation

## 2022-04-11 DIAGNOSIS — C8338 Diffuse large B-cell lymphoma, lymph nodes of multiple sites: Secondary | ICD-10-CM | POA: Insufficient documentation

## 2022-04-11 DIAGNOSIS — B449 Aspergillosis, unspecified: Secondary | ICD-10-CM | POA: Insufficient documentation

## 2022-04-11 MED ORDER — FLUDEOXYGLUCOSE F - 18 (FDG) INJECTION
11.1300 | Freq: Once | INTRAVENOUS | Status: AC | PRN
  Administered 2022-04-11: 11.13 via INTRAVENOUS

## 2022-04-17 ENCOUNTER — Inpatient Hospital Stay: Payer: Medicare Other

## 2022-04-17 ENCOUNTER — Inpatient Hospital Stay: Payer: Medicare Other | Admitting: Hematology

## 2022-04-17 VITALS — BP 129/52 | HR 52 | Temp 98.7°F | Resp 16 | Ht 66.0 in | Wt 223.7 lb

## 2022-04-17 DIAGNOSIS — B449 Aspergillosis, unspecified: Secondary | ICD-10-CM | POA: Diagnosis not present

## 2022-04-17 DIAGNOSIS — C8338 Diffuse large B-cell lymphoma, lymph nodes of multiple sites: Secondary | ICD-10-CM

## 2022-04-17 DIAGNOSIS — Z79899 Other long term (current) drug therapy: Secondary | ICD-10-CM | POA: Insufficient documentation

## 2022-04-17 DIAGNOSIS — Z86718 Personal history of other venous thrombosis and embolism: Secondary | ICD-10-CM | POA: Diagnosis not present

## 2022-04-17 LAB — CBC WITH DIFFERENTIAL/PLATELET
Abs Immature Granulocytes: 0.15 10*3/uL — ABNORMAL HIGH (ref 0.00–0.07)
Basophils Absolute: 0.1 10*3/uL (ref 0.0–0.1)
Basophils Relative: 1 %
Eosinophils Absolute: 0.4 10*3/uL (ref 0.0–0.5)
Eosinophils Relative: 5 %
HCT: 39.1 % (ref 39.0–52.0)
Hemoglobin: 13.5 g/dL (ref 13.0–17.0)
Immature Granulocytes: 2 %
Lymphocytes Relative: 18 %
Lymphs Abs: 1.4 10*3/uL (ref 0.7–4.0)
MCH: 32.1 pg (ref 26.0–34.0)
MCHC: 34.5 g/dL (ref 30.0–36.0)
MCV: 92.9 fL (ref 80.0–100.0)
Monocytes Absolute: 0.5 10*3/uL (ref 0.1–1.0)
Monocytes Relative: 6 %
Neutro Abs: 5.5 10*3/uL (ref 1.7–7.7)
Neutrophils Relative %: 68 %
Platelets: 251 10*3/uL (ref 150–400)
RBC: 4.21 MIL/uL — ABNORMAL LOW (ref 4.22–5.81)
RDW: 12.3 % (ref 11.5–15.5)
WBC: 7.9 10*3/uL (ref 4.0–10.5)
nRBC: 0 % (ref 0.0–0.2)

## 2022-04-17 LAB — COMPREHENSIVE METABOLIC PANEL
ALT: 44 U/L (ref 0–44)
AST: 26 U/L (ref 15–41)
Albumin: 3.8 g/dL (ref 3.5–5.0)
Alkaline Phosphatase: 76 U/L (ref 38–126)
Anion gap: 6 (ref 5–15)
BUN: 25 mg/dL — ABNORMAL HIGH (ref 8–23)
CO2: 31 mmol/L (ref 22–32)
Calcium: 9.7 mg/dL (ref 8.9–10.3)
Chloride: 101 mmol/L (ref 98–111)
Creatinine, Ser: 1.03 mg/dL (ref 0.61–1.24)
GFR, Estimated: >60 mL/min (ref >60)
Glucose, Bld: 204 mg/dL — ABNORMAL HIGH (ref 70–99)
Potassium: 3.7 mmol/L (ref 3.5–5.1)
Sodium: 138 mmol/L (ref 135–145)
Total Bilirubin: 0.8 mg/dL (ref 0.3–1.2)
Total Protein: 7 g/dL (ref 6.5–8.1)

## 2022-04-17 LAB — LACTATE DEHYDROGENASE: LDH: 120 U/L (ref 98–192)

## 2022-04-17 MED ORDER — SODIUM CHLORIDE 0.9% FLUSH
10.0000 mL | Freq: Once | INTRAVENOUS | Status: AC
  Administered 2022-04-17: 10 mL via INTRAVENOUS

## 2022-04-17 MED ORDER — HEPARIN SOD (PORK) LOCK FLUSH 100 UNIT/ML IV SOLN
500.0000 [IU] | Freq: Once | INTRAVENOUS | Status: AC
  Administered 2022-04-17: 500 [IU] via INTRAVENOUS

## 2022-04-17 NOTE — Progress Notes (Signed)
Patient presents today for port flush. Port accessed and good blood return noted. Port deaccessed and site covered with bandaid. Patient tolerated well without complaint. Patient discharged ambulatory and in stable condition.

## 2022-04-17 NOTE — Patient Instructions (Addendum)
[place] at [facility] Discharge Instructions   You were seen and examined today by Dr. Delton Coombes.  He reviewed the results of your PET scan. It shows that some of the lymph nodes in the groin area are slightly enlarged from previous scan. It could be lymphoma, or it could be reactive from and infection or inflammation.   We will see you back in 3 months. We will repeat a PET scan and labs prior to this visit.   Thank you for choosing [place] at [facility] to provide your oncology and hematology care.  To afford each patient quality time with our provider, please arrive at least 15 minutes before your scheduled appointment time.   If you have a lab appointment with the [facility] please come in thru the Main Entrance and check in at the main information desk.  You need to re-schedule your appointment should you arrive 10 or more minutes late.  We strive to give you quality time with our providers, and arriving late affects you and other patients whose appointments are after yours.  Also, if you no show three or more times for appointments you may be dismissed from the clinic at the providers discretion.     Again, thank you for choosing [facility].  Our hope is that these requests will decrease the amount of time that you wait before being seen by our physicians.       _____________________________________________________________  Should you have questions after your visit to [facility], please contact our office at [phone] and follow the prompts.  Our office hours are 8:00 a.m. and 4:30 p.m. Monday - Friday.  Please note that voicemails left after 4:00 p.m. may not be returned until the following business day.  We are closed weekends and major holidays.  You do have access to a nurse 24-7, just call the main number to the clinic [phone] and do not press any options, hold on the line and a nurse will  answer the phone.    For prescription refill requests, have your pharmacy contact our office and allow 72 hours.    Due to Covid, you will need to wear a mask upon entering the hospital. If you do not have a mask, a mask will be given to you at the Main Entrance upon arrival. For doctor visits, patients may have 1 support person age 10 or older with them. For treatment visits, patients can not have anyone with them due to social distancing guidelines and our immunocompromised population.

## 2022-04-17 NOTE — Progress Notes (Signed)
Kevin [name], [place] [address]   CLINIC:  Medical Oncology/Hematology  PCP:  Loman Brooklyn, [place] / [place] [address] [phone]   REASON FOR VISIT:  Follow-up for high-grade DLBCL  PRIOR THERAPY: R-EPOCH x 6 cycles from 05/11/2019 to 08/23/2019  CURRENT THERAPY: Surveillance  BRIEF ONCOLOGIC HISTORY:  Oncology History  Diffuse large B-cell lymphoma of lymph nodes of multiple regions ([place])  04/26/2019 Initial Diagnosis   Diffuse large B-cell lymphoma of lymph nodes of multiple regions [facility])   05/10/2019 - 08/30/2019 Chemotherapy   Patient is on Treatment Plan : IP NON-HODGKINS LYMPHOMA R-EPOCH q21d     05/11/2019 - 05/11/2019 Chemotherapy   The patient had DOXOrubicin (ADRIAMYCIN) chemo injection 104 mg, 50 mg/m2, Intravenous,  Once, 0 of 6 cycles palonosetron (ALOXI) injection 0.25 mg, 0.25 mg, Intravenous,  Once, 0 of 6 cycles pegfilgrastim-jmdb (FULPHILA) injection 6 mg, 6 mg, Subcutaneous,  Once, 0 of 6 cycles vinCRIStine (ONCOVIN) 2 mg in sodium chloride 0.9 % 50 mL chemo infusion, 2 mg, Intravenous,  Once, 0 of 6 cycles cyclophosphamide (CYTOXAN) 1,540 mg in sodium chloride 0.9 % 250 mL chemo infusion, 750 mg/m2, Intravenous,  Once, 0 of 6 cycles fosaprepitant (EMEND) 150 mg, dexamethasone (DECADRON) 12 mg in sodium chloride 0.9 % 145 mL IVPB, , Intravenous,  Once, 0 of 6 cycles riTUXimab-pvvr (RUXIENCE) 800 mg in sodium chloride 0.9 % 250 mL (2.4242 mg/mL) infusion, 375 mg/m2, Intravenous,  Once, 0 of 6 cycles  for chemotherapy treatment.      CANCER STAGING:  Cancer Staging  No matching staging information was found for the patient.  INTERVAL HISTORY:  Kevin [name], a 66 y.o. male, seen for follow-up of high-grade lymphoma.  He denies any recent infections or hospitalizations.  Denies any fevers or night sweats.  He reportedly had some food poisoning with diarrhea last week and lost 10 pounds.  He is  eating better this week.  REVIEW OF SYSTEMS:  Review of Systems  All other systems reviewed and are negative.   PAST MEDICAL/SURGICAL HISTORY:  Past Medical History:  Diagnosis Date   Anxiety    Depression    Diffuse large B cell lymphoma ([place]) 04/26/2019   Hyperlipidemia 02/01/2014   Hypertension    Leg DVT (deep venous thromboembolism), acute, bilateral ([place]) 04/18/2019   Port-A-Cath in place 04/27/2019   Past Surgical History:  Procedure Laterality Date   COLONOSCOPY WITH PROPOFOL N/A 12/25/2020   Procedure: COLONOSCOPY WITH PROPOFOL;  Surgeon: Daneil Dolin, MD;  [facility];  Service: Endoscopy;  Laterality: N/A;  2:15pm   CYSTOSCOPY W/ URETERAL STENT PLACEMENT Bilateral 04/21/2019   Procedure: CYSTOSCOPY WITH RETROGRADE PYELOGRAM/URETERAL STENT PLACEMENT;  Surgeon: Cleon Gustin, MD;  Location: [facility];  Service: Urology;  Laterality: Bilateral;   ETHMOIDECTOMY Right 02/04/2020   Procedure: ETHMOIDECTOMY;  Surgeon: Leta Baptist, MD;  Location: [place];  Service: ENT;  Laterality: Right;   INGUINAL LYMPH NODE BIOPSY Right 04/21/2019   Procedure: INGUINAL LYMPH NODE BIOPSY;  Surgeon: Virl Cagey, MD;  Location: [facility];  Service: General;  Laterality: Right;   MAXILLARY ANTROSTOMY Right 02/04/2020   Procedure: MAXILLARY ANTROSTOMY WITH TISSUE REMOVAL;  Surgeon: Leta Baptist, MD;  Location: [place];  Service: ENT;  Laterality: Right;   PORTACATH PLACEMENT Left 05/07/2019   Procedure: INSERTION PORT-A-CATH (catheter left subclavian);  Surgeon: Virl Cagey, MD;  Location: [facility];  Service:  General;  Laterality: Left;    SOCIAL HISTORY:  Social History   Socioeconomic History   Marital status: Married    Spouse name: Not on file   Number of children: 3   Years of education: 10   Highest education level: Not on file  Occupational History   Not on file  Tobacco Use   Smoking status: Former    Packs/day: 3.00    Years:  30.00    Total pack years: 90.00    Types: Cigarettes    Quit date: 04/27/1999    Years since quitting: 22.9   Smokeless tobacco: Never  Vaping Use   Vaping Use: Never used  Substance and Sexual Activity   Alcohol use: Yes    Comment: daily. 2-6 beer daily   Drug use: Never   Sexual activity: Yes  Other Topics Concern   Not on file  Social History Narrative   Not on file   Social Determinants of Health   Financial Resource Strain: Low Risk  (11/30/2020)   Overall Financial Resource Strain (CARDIA)    Difficulty of Paying Living Expenses: Not very hard  Food Insecurity: No Food Insecurity (11/30/2020)   Hunger Vital Sign    Worried About Running Out of Food in the Last Year: Never true    Ran Out of Food in the Last Year: Never true  Transportation Needs: No Transportation Needs (11/30/2020)   PRAPARE - Hydrologist (Medical): No    Lack of Transportation (Non-Medical): No  Physical Activity: Inactive (11/30/2020)   Exercise Vital Sign    Days of Exercise per Week: 0 days    Minutes of Exercise per Session: 0 min  Stress: No Stress Concern Present (11/30/2020)   [place]    Feeling of Stress : Only a little  Social Connections: Moderately Integrated (11/30/2020)   Social Connection and Isolation Panel [NHANES]    Frequency of Communication with Friends and Family: More than three times a week    Frequency of Social Gatherings with Friends and Family: More than three times a week    Attends Religious Services: 1 to 4 times per year    Active Member of [employer] or Organizations: No    Attends Archivist Meetings: Never    Marital Status: Married  Human resources officer Violence: Not At Risk (11/30/2020)   Humiliation, Afraid, Rape, and Kick questionnaire    Fear of Current or Ex-Partner: No    Emotionally Abused: No    Physically Abused: No    Sexually Abused: No    FAMILY HISTORY:   Family History  Problem Relation Age of Onset   Diabetes Brother    Diabetes Mother    Leukemia Brother    Diabetes Son    Colon cancer Neg Hx     CURRENT MEDICATIONS:  Current Outpatient Medications  Medication Sig Dispense Refill   amLODipine (NORVASC) 10 MG tablet TAKE 1 TABLET BY MOUTH EVERY DAY 90 tablet 0   aspirin EC 81 MG tablet Take 81 mg by mouth daily. Swallow whole.     escitalopram (LEXAPRO) 10 MG tablet TAKE 1 TABLET BY MOUTH EVERY DAY 90 tablet 0   lidocaine-prilocaine (EMLA) cream Apply 1 application topically as needed (port access).     LORazepam (ATIVAN) 0.5 MG tablet TAKE 1 TABLET (0.5 MG TOTAL) BY MOUTH EVERY 8 (EIGHT) HOURS. (Patient taking differently: Take 0.5 mg by mouth every 8 (eight) hours  as needed for anxiety.) 60 tablet 3   losartan-hydrochlorothiazide (HYZAAR) 100-25 MG tablet Take 1 tablet by mouth daily. 90 tablet 1   metoprolol succinate (TOPROL-XL) 50 MG 24 hr tablet TAKE 1 TABLET BY MOUTH EVERY DAY WITH OR IMMEDIATELY FOLLOWING A MEAL 90 tablet 0   omega-3 acid ethyl esters (LOVAZA) 1 g capsule Take 2 capsules (2 g total) by mouth 2 (two) times daily. 360 capsule 1   No current facility-administered medications for this visit.   Facility-Administered Medications Ordered in Other Visits  Medication Dose Route Frequency Provider Last Rate Last Admin   0.9 %  sodium chloride infusion   Intravenous Continuous Derek Jack, MD 20 mL/hr at 08/26/19 1105 New Bag at 08/26/19 1105   sodium chloride flush (NS) 0.9 % injection 10 mL  10 mL Intravenous PRN Derek Jack, MD   10 mL at 08/26/19 1104   sodium chloride flush (NS) 0.9 % injection 10 mL  10 mL Intravenous PRN Derek Jack, MD   10 mL at 06/22/21 1107    ALLERGIES:  No Known Allergies  PHYSICAL EXAM:  Performance status (ECOG): 1 - Symptomatic but completely ambulatory  Vitals:   04/17/22 1407  BP: (!) 129/52  Pulse: (!) 52  Resp: 16  Temp: 98.7 F (37.1 C)  SpO2:  97%   Wt Readings from Last 3 Encounters:  04/17/22 223 lb 11.2 oz (101.5 kg)  02/26/22 231 lb 12.8 oz (105.1 kg)  01/11/22 230 lb 12.8 oz (104.7 kg)   Physical Exam Vitals reviewed.  Constitutional:      Appearance: Normal appearance. He is obese.  Cardiovascular:     Rate and Rhythm: Normal rate and regular rhythm.     Pulses: Normal pulses.     Heart sounds: Normal heart sounds.  Pulmonary:     Effort: Pulmonary effort is normal.     Breath sounds: Normal breath sounds.  Abdominal:     Palpations: Abdomen is soft. There is no mass.     Tenderness: There is no abdominal tenderness.  Musculoskeletal:     Right lower leg: No edema.     Left lower leg: No edema.  Lymphadenopathy:     Lower Body: No right inguinal adenopathy. No left inguinal adenopathy.  Neurological:     General: No focal deficit present.     Mental Status: He is alert and oriented to person, place, and time.  Psychiatric:        Mood and Affect: Mood normal.        Behavior: Behavior normal.      LABORATORY DATA:  I have reviewed the labs as listed.     Latest Ref Rng & Units 04/17/2022    1:07 PM 01/03/2022    9:04 AM 06/22/2021   10:23 AM  CBC  WBC 4.0 - 10.5 K/uL 7.9  6.8  6.6   Hemoglobin 13.0 - 17.0 g/dL 13.5  14.0  13.0   Hematocrit 39.0 - 52.0 % 39.1  41.0  38.1   Platelets 150 - 400 K/uL 251  183  191       Latest Ref Rng & Units 04/17/2022    1:07 PM 01/03/2022    9:04 AM 06/22/2021   10:23 AM  CMP  Glucose 70 - 99 mg/dL 204  130  119   BUN 8 - 23 mg/dL '25  24  16   '$ Creatinine 0.61 - 1.24 mg/dL 1.03  0.95  0.81   Sodium 135 - 145 mmol/L  138  139  135   Potassium 3.5 - 5.1 mmol/L 3.7  4.2  4.0   Chloride 98 - 111 mmol/L 101  104  101   CO2 22 - 32 mmol/L '31  29  27   '$ Calcium 8.9 - 10.3 mg/dL 9.7  9.1  8.5   Total Protein 6.5 - 8.1 g/dL 7.0  7.0  6.1   Total Bilirubin 0.3 - 1.2 mg/dL 0.8  0.1  0.4   Alkaline Phos 38 - 126 U/L 76  64  67   AST 15 - 41 U/L '26  22  18   '$ ALT 0 - 44  U/L 44  23  19     DIAGNOSTIC IMAGING:  I have independently reviewed the scans and discussed with the patient. NM PET Image Restag (PS) Skull Base To Thigh  Result Date: 04/15/2022 CLINICAL DATA:  Subsequent treatment strategy for lymphoma. EXAM: NUCLEAR MEDICINE PET SKULL BASE TO THIGH TECHNIQUE: 11.13 mCi F-18 FDG was injected intravenously. Full-ring PET imaging was performed from the skull base to thigh after the radiotracer. CT data was obtained and used for attenuation correction and anatomic localization. Fasting blood glucose: 114 mg/dl COMPARISON:  Numerous prior PET CT examinations. The most recent is 01/03/2022. FINDINGS: Mediastinal blood pool activity: SUV max 3.25 Liver activity: SUV max 4.43 NECK: No hypermetabolic lymph nodes in the neck. Incidental CT findings: Bilateral carotid artery calcifications, advanced for age. CHEST: No hypermetabolic mediastinal or hilar nodes. No suspicious pulmonary nodules on the CT scan. No hypermetabolic chest wall lesions or axillary adenopathy. Incidental CT findings: Stable age advanced atherosclerotic calcifications involving the aorta and branch vessels including three-vessel coronary artery calcifications. ABDOMEN/PELVIS: Persistent large masslike area of nodal disease in the small bowel mesentery with hazy interstitial changes and scattered lymph nodes. Some of the nodes appear slightly more discrete on this study. Left-sided node on image 172/3 measures 12 mm and previously measured 10 mm. The overall SUV max in this area is 4.36 and was previously 3.20. There is a 10 mm node in the right mesentery on image 190/3 which has an SUV max of 5.28. This previously measured 7 mm. Deauville 3 and 4 disease. Small scattered left-sided retroperitoneal lymph nodes but no hypermetabolism. 8 mm node in the anterior pelvis anterior to the sigmoid colon on image 229/3 has an SUV max of 4.35 (Deauville 3). This previously measured 6 mm and had an SUV max of 3.17.  Smaller adjacent node on the same image measures 5.5 mm and previously measured 5 mm. There is a stable soft tissue nodule and or possible lymph node near the base of the penis on the left side which is stable. SUV max is 6.25 and was previously 5.25. Deauville 4. Bilateral hypermetabolic inguinal lymph nodes. The left node measures 11 mm and has an SUV max of 9.99. This previously measured 9 mm and had an SUV max of 5.39. The right node measures 10 mm and previously measured 9 mm. SUV max is 12.03 and was previously 7.51. Deauville 4 disease. The solid abdominal organs are unremarkable. No evidence of lymphomatous involvement of the liver or spleen. Stable benign left adrenal gland adenoma. Diffuse and marked FDG uptake noted throughout the colon. This could be related to the patient taking insulin or oral hypoglycemic agents such as metformin. I do not see any evidence of inflammatory or infectious colitis on a CT scan. Incidental CT findings: Stable age advanced vascular disease. SKELETON: No findings for osseous lymphoma. Incidental  CT findings: None. IMPRESSION: 1. Overall, slight progression of lymphoma with some nodes demonstrating slight enlargement and increased FDG uptake. Deauville 3 and 4 disease. 2. Stable age advanced vascular disease. Electronically Signed   By: Marijo Sanes M.D.   On: 04/15/2022 10:20     ASSESSMENT:  1.  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 -6 cycles of R-EPOCH from 05/11/2019 through 08/23/2019. -PET scan on 09/17/2019 showed central abdominal mass with SUV 2.9, measuring 10 x 3.7 cm.  Mildly enlarged right pelvic sidewall lymph node 12 mm SUV 1.7.  Areas of multifocal bone involvement with near complete resolution.  Deauville 2/3 in the five-point scale. -He was evaluated by  lymphoma specialist Dr. Cassell Clement at the request [facility].  His case was discussed at tumor board.  No adjuvant radiation therapy or maintenance rituximab or transplant was recommended at this time. -PET scan on 12/14/2019 shows soft tissue and nodularity at the root and within the small bowel mesentery, grossly similar measuring 4.2 x 11.2 cm with SUV max 3.6, similar to 3.2 previously.  External iliac and inguinal lymph nodes do not show metabolic some low blood pool.  SUV 2.3. -Enlarging rounded soft tissue in the right maxillary sinus with periosteal thickening and mild hypermetabolic zone. -PET scan on 03/27/2020 shows confluent soft tissue density in the central small bowel mesentery 11.6 x 4.2 cm with no significant change in size.  SUV 3.0, previously 3.6.  Small left para-aortic lymph node 1 cm with no FDG uptake.  Right external iliac lymph node, 10 mm with SUV 1.8, previously 2.3.  No new or progressive areas.     2.  Bilateral leg DVT: -Ultrasound Doppler on 04/18/2019 showed bilateral leg DVT. -He is on Eliquis since then. -Repeat Doppler on 12/29/2019 shows previous extensive right lower extremity DVT has resolved with minor residual nonocclusive right peroneal thrombus noted.  Very minimal residual thrombus burden.  Negative left leg DVT. - Repeat Dopplers negative on 04/03/2020.  Eliquis was discontinued.   3.  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    PLAN:  1.  Double hit lymphoma: - He does not report any fevers or night sweats.  He lost 10 pounds last week as he had diarrhea from food poisoning. - This week he is eating better.  Denies any abdominal pains. - He is continuing to work full-time job. -Labs from 04/17/2022 shows normal LFTs, LDH.  CBC was normal. - PET scan (04/11/2022): Some nodes, particularly inguinal nodes have increased by 2 to 3 mm with  increase in SUV uptake. - We discussed option of biopsy versus watchful waiting with another PET scan in 3 months.  He is completely asymptomatic.  He is agreeable to the latter option. - RTC 3 months with repeat PET scan and labs.       Orders placed this encounter:  Orders Placed This Encounter  Procedures   NM PET Image Restag (PS) Skull Base To Thigh      Derek Jack, MD [place] [phone]  .

## 2022-04-30 ENCOUNTER — Ambulatory Visit (INDEPENDENT_AMBULATORY_CARE_PROVIDER_SITE_OTHER): Payer: Medicare Other | Admitting: Family Medicine

## 2022-04-30 ENCOUNTER — Encounter: Payer: Self-pay | Admitting: *Deleted

## 2022-04-30 ENCOUNTER — Encounter: Payer: Self-pay | Admitting: Family Medicine

## 2022-04-30 VITALS — BP 151/63 | HR 61 | Temp 98.8°F | Ht 66.0 in | Wt 226.0 lb

## 2022-04-30 DIAGNOSIS — J069 Acute upper respiratory infection, unspecified: Secondary | ICD-10-CM

## 2022-04-30 MED ORDER — FLUTICASONE PROPIONATE 50 MCG/ACT NA SUSP: 2.0000 | Freq: Every day | NASAL | 6 refills | Status: AC

## 2022-04-30 MED ORDER — DOXYCYCLINE HYCLATE 100 MG PO TABS: 100.0000 mg | ORAL_TABLET | Freq: Two times a day (BID) | ORAL | 0 refills | Status: AC

## 2022-04-30 NOTE — Progress Notes (Signed)
Subjective:  Patient ID: Kevin [name], male    DOB: 12/26/1955, 66 y.o.   MRN: [id number]  Patient Care Team: Baruch Gouty, FNP as PCP - General (Family Medicine) Gala Romney Cristopher Estimable, MD as Consulting Physician (Gastroenterology) Derek Jack, MD as Consulting Physician (Hematology)   Chief Complaint:  nasal chest congestion and URI   HPI: Kevin [name] is a 66 y.o. male presenting on 04/30/2022 for nasal chest congestion and URI   URI symptoms for over 2 weeks, improved slightly with Mucinex and then returned and are worse.   URI  This is a recurrent problem. Episode onset: 2.5 weeks ago. The problem has been gradually worsening. Associated symptoms include congestion, coughing, headaches, a plugged ear sensation, rhinorrhea and a sore throat. Pertinent negatives include no abdominal pain, chest pain, diarrhea, dysuria, ear pain, joint pain, joint swelling, nausea, neck pain, rash, sinus pain, sneezing, swollen glands, vomiting or wheezing. He has tried decongestant for the symptoms. The treatment provided mild relief.    Relevant past medical, surgical, family, and social history reviewed and updated as indicated.  Allergies and medications reviewed and updated. Data reviewed: Chart in Epic.   Past Medical History:  Diagnosis Date   Anxiety    Depression    Diffuse large B cell lymphoma ([place]) 04/26/2019   Hyperlipidemia 02/01/2014   Hypertension    Leg DVT (deep venous thromboembolism), acute, bilateral ([place]) 04/18/2019   Port-A-Cath in place 04/27/2019    Past Surgical History:  Procedure Laterality Date   COLONOSCOPY WITH PROPOFOL N/A 12/25/2020   Procedure: COLONOSCOPY WITH PROPOFOL;  Surgeon: Daneil Dolin, MD;  [facility];  Service: Endoscopy;  Laterality: N/A;  2:15pm   CYSTOSCOPY W/ URETERAL STENT PLACEMENT Bilateral 04/21/2019   Procedure: CYSTOSCOPY WITH RETROGRADE PYELOGRAM/URETERAL STENT PLACEMENT;  Surgeon: Cleon Gustin, MD;   Location: [facility];  Service: Urology;  Laterality: Bilateral;   ETHMOIDECTOMY Right 02/04/2020   Procedure: ETHMOIDECTOMY;  Surgeon: Leta Baptist, MD;  Location: [place];  Service: ENT;  Laterality: Right;   INGUINAL LYMPH NODE BIOPSY Right 04/21/2019   Procedure: INGUINAL LYMPH NODE BIOPSY;  Surgeon: Virl Cagey, MD;  Location: [facility];  Service: General;  Laterality: Right;   MAXILLARY ANTROSTOMY Right 02/04/2020   Procedure: MAXILLARY ANTROSTOMY WITH TISSUE REMOVAL;  Surgeon: Leta Baptist, MD;  Location: [place];  Service: ENT;  Laterality: Right;   PORTACATH PLACEMENT Left 05/07/2019   Procedure: INSERTION PORT-A-CATH (catheter left subclavian);  Surgeon: Virl Cagey, MD;  Location: [facility];  Service: General;  Laterality: Left;    Social History   Socioeconomic History   Marital status: Married    Spouse name: Not on file   Number of children: 3   Years of education: 10   Highest education level: Not on file  Occupational History   Not on file  Tobacco Use   Smoking status: Former    Packs/day: 3.00    Years: 30.00    Total pack years: 90.00    Types: Cigarettes    Quit date: 04/27/1999    Years since quitting: 23.0   Smokeless tobacco: Never  Vaping Use   Vaping Use: Never used  Substance and Sexual Activity   Alcohol use: Yes    Comment: daily. 2-6 beer daily   Drug use: Never   Sexual activity: Yes  Other Topics Concern   Not on file  Social History Narrative   Not on file  Social Determinants of Health   Financial Resource Strain: Low Risk  (11/30/2020)   Overall Financial Resource Strain (CARDIA)    Difficulty of Paying Living Expenses: Not very hard  Food Insecurity: No Food Insecurity (11/30/2020)   Hunger Vital Sign    Worried About Running Out of Food in the Last Year: Never true    Ran Out of Food in the Last Year: Never true  Transportation Needs: No Transportation Needs (11/30/2020)   PRAPARE - Civil engineer, contracting (Medical): No    Lack of Transportation (Non-Medical): No  Physical Activity: Inactive (11/30/2020)   Exercise Vital Sign    Days of Exercise per Week: 0 days    Minutes of Exercise per Session: 0 min  Stress: No Stress Concern Present (11/30/2020)   [place]    Feeling of Stress : Only a little  Social Connections: Moderately Integrated (11/30/2020)   Social Connection and Isolation Panel [NHANES]    Frequency of Communication with Friends and Family: More than three times a week    Frequency of Social Gatherings with Friends and Family: More than three times a week    Attends Religious Services: 1 to 4 times per year    Active Member of [employer] or Organizations: No    Attends Archivist Meetings: Never    Marital Status: Married  Human resources officer Violence: Not At Risk (11/30/2020)   Humiliation, Afraid, Rape, and Kick questionnaire    Fear of Current or Ex-Partner: No    Emotionally Abused: No    Physically Abused: No    Sexually Abused: No    Outpatient Encounter Medications as of 04/30/2022  Medication Sig   amLODipine (NORVASC) 10 MG tablet TAKE 1 TABLET BY MOUTH EVERY DAY   aspirin EC 81 MG tablet Take 81 mg by mouth daily. Swallow whole.   doxycycline (VIBRA-TABS) 100 MG tablet Take 1 tablet (100 mg total) by mouth 2 (two) times daily for 10 days. 1 po bid   escitalopram (LEXAPRO) 10 MG tablet TAKE 1 TABLET BY MOUTH EVERY DAY   fluticasone (FLONASE) 50 MCG/ACT nasal spray Place 2 sprays into both nostrils daily.   LORazepam (ATIVAN) 0.5 MG tablet TAKE 1 TABLET (0.5 MG TOTAL) BY MOUTH EVERY 8 (EIGHT) HOURS. (Patient taking differently: Take 0.5 mg by mouth every 8 (eight) hours as needed for anxiety.)   losartan-hydrochlorothiazide (HYZAAR) 100-25 MG tablet Take 1 tablet by mouth daily.   metoprolol succinate (TOPROL-XL) 50 MG 24 hr tablet TAKE 1 TABLET BY MOUTH EVERY DAY WITH OR IMMEDIATELY  FOLLOWING A MEAL   omega-3 acid ethyl esters (LOVAZA) 1 g capsule Take 2 capsules (2 g total) by mouth 2 (two) times daily.   [DISCONTINUED] lidocaine-prilocaine (EMLA) cream Apply 1 application topically as needed (port access).   Facility-Administered Encounter Medications as of 04/30/2022  Medication   0.9 %  sodium chloride infusion   sodium chloride flush (NS) 0.9 % injection 10 mL   sodium chloride flush (NS) 0.9 % injection 10 mL    No Known Allergies  Review of Systems  Constitutional:  Positive for activity change and fatigue. Negative for appetite change, chills, diaphoresis, fever and unexpected weight change.  HENT:  Positive for congestion, postnasal drip, rhinorrhea and sore throat. Negative for dental problem, drooling, ear discharge, ear pain, facial swelling, hearing loss, mouth sores, nosebleeds, sinus pressure, sinus pain, sneezing, tinnitus, trouble swallowing and voice change.   Eyes:  Negative.   Respiratory:  Positive for cough. Negative for chest tightness, shortness of breath and wheezing.   Cardiovascular:  Negative for chest pain, palpitations and leg swelling.  Gastrointestinal:  Negative for abdominal pain, blood in stool, constipation, diarrhea, nausea and vomiting.  Endocrine: Negative.   Genitourinary:  Negative for decreased urine volume, difficulty urinating, dysuria, frequency and urgency.  Musculoskeletal:  Negative for arthralgias, joint pain, myalgias and neck pain.  Skin: Negative.  Negative for rash.  Allergic/Immunologic: Negative.   Neurological:  Positive for headaches. Negative for dizziness, tremors, seizures, syncope, facial asymmetry, speech difficulty, weakness, light-headedness and numbness.  Hematological: Negative.   Psychiatric/Behavioral:  Negative for confusion, hallucinations, sleep disturbance and suicidal ideas.   All other systems reviewed and are negative.       Objective:  BP (!) 151/63   Pulse 61   Temp 98.8 F (37.1 C)    Ht '5\' 6"'$  (1.676 m)   Wt 226 lb (102.5 kg)   SpO2 94%   BMI 36.48 kg/m    Wt Readings from Last 3 Encounters:  04/30/22 226 lb (102.5 kg)  04/17/22 223 lb 11.2 oz (101.5 kg)  02/26/22 231 lb 12.8 oz (105.1 kg)    Physical Exam Vitals and nursing note reviewed.  Constitutional:      General: He is not in acute distress.    Appearance: Normal appearance. He is well-developed and well-groomed. He is obese. He is not ill-appearing, toxic-appearing or diaphoretic.  HENT:     Head: Normocephalic and atraumatic.     Jaw: There is normal jaw occlusion.     Right Ear: Hearing normal. A middle ear effusion is present. Tympanic membrane is not erythematous.     Left Ear: Hearing normal. A middle ear effusion is present. Tympanic membrane is not erythematous.     Nose: Congestion and rhinorrhea present.     Right Turbinates: Swollen.     Left Turbinates: Swollen.     Right Sinus: No maxillary sinus tenderness or frontal sinus tenderness.     Left Sinus: No maxillary sinus tenderness or frontal sinus tenderness.     Mouth/Throat:     Lips: Pink.     Mouth: Mucous membranes are moist.     Pharynx: Oropharynx is clear. Uvula midline. Posterior oropharyngeal erythema present. No oropharyngeal exudate.     Tonsils: No tonsillar exudate or tonsillar abscesses.  Eyes:     General: Lids are normal.     Extraocular Movements: Extraocular movements intact.     Conjunctiva/sclera: Conjunctivae normal.     Pupils: Pupils are equal, round, and reactive to light.  Neck:     Thyroid: No thyroid mass, thyromegaly or thyroid tenderness.     Vascular: No carotid bruit or JVD.     Trachea: Trachea and phonation normal.  Cardiovascular:     Rate and Rhythm: Normal rate and regular rhythm.     Chest Wall: PMI is not displaced.     Pulses: Normal pulses.     Heart sounds: Normal heart sounds. No murmur heard.    No friction rub. No gallop.  Pulmonary:     Effort: Pulmonary effort is normal. No  respiratory distress.     Breath sounds: Normal breath sounds. No wheezing.  Abdominal:     General: There is no abdominal bruit.     Palpations: There is no hepatomegaly or splenomegaly.  Musculoskeletal:        General: Normal range of motion.     Cervical  back: Normal range of motion and neck supple.     Right lower leg: No edema.     Left lower leg: No edema.  Lymphadenopathy:     Cervical: No cervical adenopathy.  Skin:    General: Skin is warm and dry.     Capillary Refill: Capillary refill takes less than 2 seconds.     Coloration: Skin is not cyanotic, jaundiced or pale.     Findings: No rash.  Neurological:     General: No focal deficit present.     Mental Status: He is alert and oriented to person, place, and time.     Sensory: Sensation is intact.     Motor: Motor function is intact.     Coordination: Coordination is intact.     Gait: Gait is intact.     Deep Tendon Reflexes: Reflexes are normal and symmetric.  Psychiatric:        Attention and Perception: Attention and perception normal.        Mood and Affect: Mood and affect normal.        Speech: Speech normal.        Behavior: Behavior normal. Behavior is cooperative.        Thought Content: Thought content normal.        Cognition and Memory: Cognition and memory normal.        Judgment: Judgment normal.     Results for orders placed or performed in visit on 04/17/22  Lactate dehydrogenase  Result Value Ref Range   LDH 120 98 - 192 U/L  Comprehensive metabolic panel  Result Value Ref Range   Sodium 138 135 - 145 mmol/L   Potassium 3.7 3.5 - 5.1 mmol/L   Chloride 101 98 - 111 mmol/L   CO2 31 22 - 32 mmol/L   Glucose, Bld 204 (H) 70 - 99 mg/dL   BUN 25 (H) 8 - 23 mg/dL   Creatinine, Ser 1.03 0.61 - 1.24 mg/dL   Calcium 9.7 8.9 - 10.3 mg/dL   Total Protein 7.0 6.5 - 8.1 g/dL   Albumin 3.8 3.5 - 5.0 g/dL   AST 26 15 - 41 U/L   ALT 44 0 - 44 U/L   Alkaline Phosphatase 76 38 - 126 U/L   Total  Bilirubin 0.8 0.3 - 1.2 mg/dL   GFR, Estimated >60 >60 mL/min   Anion gap 6 5 - 15  CBC with Differential/Platelet  Result Value Ref Range   WBC 7.9 4.0 - 10.5 K/uL   RBC 4.21 (L) 4.22 - 5.81 MIL/uL   Hemoglobin 13.5 13.0 - 17.0 g/dL   HCT 39.1 39.0 - 52.0 %   MCV 92.9 80.0 - 100.0 fL   MCH 32.1 26.0 - 34.0 pg   MCHC 34.5 30.0 - 36.0 g/dL   RDW 12.3 11.5 - 15.5 %   Platelets 251 150 - 400 K/uL   nRBC 0.0 0.0 - 0.2 %   Neutrophils Relative % 68 %   Neutro Abs 5.5 1.7 - 7.7 K/uL   Lymphocytes Relative 18 %   Lymphs Abs 1.4 0.7 - 4.0 K/uL   Monocytes Relative 6 %   Monocytes Absolute 0.5 0.1 - 1.0 K/uL   Eosinophils Relative 5 %   Eosinophils Absolute 0.4 0.0 - 0.5 K/uL   Basophils Relative 1 %   Basophils Absolute 0.1 0.0 - 0.1 K/uL   Immature Granulocytes 2 %   Abs Immature Granulocytes 0.15 (H) 0.00 - 0.07 K/uL  Pertinent labs & imaging results that were available during my care of the patient were reviewed by me and considered in my medical decision making.  Assessment & Plan:  Kevin [name] was seen today for nasal chest congestion and uri.  Diagnoses and all orders for this visit:  URI with cough and congestion URI with worsening symptoms over the last 2.5 weeks. Due to continued symptoms, will treat with antibiotics. Continue Mucinex with lots of water and add Flonase. Symptomatic care discussed in detail. Report new, worsening, or persistent symptoms.  -     fluticasone (FLONASE) 50 MCG/ACT nasal spray; Place 2 sprays into both nostrils daily. -     doxycycline (VIBRA-TABS) 100 MG tablet; Take 1 tablet (100 mg total) by mouth 2 (two) times daily for 10 days. 1 po bid     Continue all other maintenance medications.  Follow up plan: Return if symptoms worsen or fail to improve.   Continue healthy lifestyle choices, including diet (rich in fruits, vegetables, and lean proteins, and low in salt and simple carbohydrates) and exercise (at least 30 minutes of moderate  physical activity daily).  Educational handout given for URI  The above assessment and management plan was discussed with the patient. The patient verbalized understanding of and has agreed to the management plan. Patient is aware to call the clinic if they develop any new symptoms or if symptoms persist or worsen. Patient is aware when to return to the clinic for a follow-up visit. Patient educated on when it is appropriate to go to the emergency department.   Monia Pouch, FNP-C [facility] [phone]

## 2022-05-05 ENCOUNTER — Other Ambulatory Visit: Payer: Self-pay | Admitting: Family Medicine

## 2022-05-05 DIAGNOSIS — F419 Anxiety disorder, unspecified: Secondary | ICD-10-CM

## 2022-07-03 ENCOUNTER — Other Ambulatory Visit: Payer: Self-pay | Admitting: Family Medicine

## 2022-07-03 DIAGNOSIS — I10 Essential (primary) hypertension: Secondary | ICD-10-CM

## 2022-07-11 ENCOUNTER — Encounter (HOSPITAL_COMMUNITY): Payer: Self-pay | Admitting: Hematology

## 2022-07-16 ENCOUNTER — Other Ambulatory Visit: Payer: Self-pay

## 2022-07-16 DIAGNOSIS — C8338 Diffuse large B-cell lymphoma, lymph nodes of multiple sites: Secondary | ICD-10-CM

## 2022-07-18 ENCOUNTER — Inpatient Hospital Stay: Payer: Medicare Other | Attending: Hematology

## 2022-07-18 ENCOUNTER — Other Ambulatory Visit (HOSPITAL_COMMUNITY): Payer: Medicare Other

## 2022-07-18 ENCOUNTER — Encounter (HOSPITAL_COMMUNITY): Payer: Medicare Other

## 2022-07-18 DIAGNOSIS — C8338 Diffuse large B-cell lymphoma, lymph nodes of multiple sites: Secondary | ICD-10-CM | POA: Diagnosis not present

## 2022-07-18 LAB — CBC WITH DIFFERENTIAL/PLATELET
Abs Immature Granulocytes: 0.02 10*3/uL (ref 0.00–0.07)
Basophils Absolute: 0.1 10*3/uL (ref 0.0–0.1)
Basophils Relative: 1 %
Eosinophils Absolute: 0.4 10*3/uL (ref 0.0–0.5)
Eosinophils Relative: 6 %
HCT: 44.2 % (ref 39.0–52.0)
Hemoglobin: 15.1 g/dL (ref 13.0–17.0)
Immature Granulocytes: 0 %
Lymphocytes Relative: 19 %
Lymphs Abs: 1.3 10*3/uL (ref 0.7–4.0)
MCH: 31.7 pg (ref 26.0–34.0)
MCHC: 34.2 g/dL (ref 30.0–36.0)
MCV: 92.9 fL (ref 80.0–100.0)
Monocytes Absolute: 0.6 10*3/uL (ref 0.1–1.0)
Monocytes Relative: 9 %
Neutro Abs: 4.2 10*3/uL (ref 1.7–7.7)
Neutrophils Relative %: 65 %
Platelets: 198 10*3/uL (ref 150–400)
RBC: 4.76 MIL/uL (ref 4.22–5.81)
RDW: 12 % (ref 11.5–15.5)
WBC: 6.5 10*3/uL (ref 4.0–10.5)
nRBC: 0 % (ref 0.0–0.2)

## 2022-07-18 LAB — COMPREHENSIVE METABOLIC PANEL
ALT: 26 U/L (ref 0–44)
AST: 25 U/L (ref 15–41)
Albumin: 4 g/dL (ref 3.5–5.0)
Alkaline Phosphatase: 70 U/L (ref 38–126)
Anion gap: 8 (ref 5–15)
BUN: 19 mg/dL (ref 8–23)
CO2: 29 mmol/L (ref 22–32)
Calcium: 9 mg/dL (ref 8.9–10.3)
Chloride: 100 mmol/L (ref 98–111)
Creatinine, Ser: 0.87 mg/dL (ref 0.61–1.24)
GFR, Estimated: >60 mL/min (ref >60)
Glucose, Bld: 127 mg/dL — ABNORMAL HIGH (ref 70–99)
Potassium: 4.2 mmol/L (ref 3.5–5.1)
Sodium: 137 mmol/L (ref 135–145)
Total Bilirubin: 0.7 mg/dL (ref 0.3–1.2)
Total Protein: 6.8 g/dL (ref 6.5–8.1)

## 2022-07-18 LAB — LACTATE DEHYDROGENASE: LDH: 132 U/L (ref 98–192)

## 2022-07-25 ENCOUNTER — Ambulatory Visit: Payer: Medicare Other | Admitting: Hematology

## 2022-07-29 ENCOUNTER — Ambulatory Visit
Admission: RE | Admit: 2022-07-29 | Discharge: 2022-07-29 | Disposition: A | Discharge status: Home / Self Care | Payer: Medicare Other | Source: Ambulatory Visit | Attending: Hematology | Admitting: Hematology

## 2022-07-29 DIAGNOSIS — I2510 Atherosclerotic heart disease of native coronary artery without angina pectoris: Secondary | ICD-10-CM | POA: Insufficient documentation

## 2022-07-29 DIAGNOSIS — D3502 Benign neoplasm of left adrenal gland: Secondary | ICD-10-CM | POA: Diagnosis not present

## 2022-07-29 DIAGNOSIS — C8338 Diffuse large B-cell lymphoma, lymph nodes of multiple sites: Secondary | ICD-10-CM

## 2022-07-29 DIAGNOSIS — I700 Atherosclerosis of aorta: Secondary | ICD-10-CM | POA: Insufficient documentation

## 2022-07-29 DIAGNOSIS — R590 Localized enlarged lymph nodes: Secondary | ICD-10-CM | POA: Insufficient documentation

## 2022-07-29 DIAGNOSIS — C8330 Diffuse large B-cell lymphoma, unspecified site: Secondary | ICD-10-CM | POA: Insufficient documentation

## 2022-07-29 LAB — GLUCOSE, CAPILLARY: Glucose-Capillary: 117 mg/dL — ABNORMAL HIGH (ref 70–99)

## 2022-07-29 MED ORDER — FLUDEOXYGLUCOSE F - 18 (FDG) INJECTION
12.5700 | Freq: Once | INTRAVENOUS | Status: AC | PRN
Start: 2022-07-29 — End: 2022-07-29
  Administered 2022-07-29: 12.57 via INTRAVENOUS

## 2022-08-05 ENCOUNTER — Inpatient Hospital Stay: Payer: Medicare Other

## 2022-08-05 ENCOUNTER — Inpatient Hospital Stay: Payer: Medicare Other | Attending: Hematology | Admitting: Hematology

## 2022-08-05 VITALS — BP 147/56 | HR 55 | Temp 98.2°F | Resp 16 | Wt 229.4 lb

## 2022-08-05 DIAGNOSIS — Z7901 Long term (current) use of anticoagulants: Secondary | ICD-10-CM | POA: Insufficient documentation

## 2022-08-05 DIAGNOSIS — Z86718 Personal history of other venous thrombosis and embolism: Secondary | ICD-10-CM | POA: Diagnosis not present

## 2022-08-05 DIAGNOSIS — C8338 Diffuse large B-cell lymphoma, lymph nodes of multiple sites: Secondary | ICD-10-CM | POA: Diagnosis not present

## 2022-08-05 DIAGNOSIS — B449 Aspergillosis, unspecified: Secondary | ICD-10-CM | POA: Diagnosis not present

## 2022-08-05 DIAGNOSIS — Z95828 Presence of other vascular implants and grafts: Secondary | ICD-10-CM

## 2022-08-05 DIAGNOSIS — Z79899 Other long term (current) drug therapy: Secondary | ICD-10-CM | POA: Diagnosis not present

## 2022-08-05 MED ORDER — HEPARIN SOD (PORK) LOCK FLUSH 100 UNIT/ML IV SOLN
500.0000 [IU] | Freq: Once | INTRAVENOUS | Status: AC
  Administered 2022-08-05: 500 [IU] via INTRAVENOUS

## 2022-08-05 MED ORDER — SODIUM CHLORIDE 0.9% FLUSH
10.0000 mL | Freq: Once | INTRAVENOUS | Status: AC
  Administered 2022-08-05: 10 mL via INTRAVENOUS

## 2022-08-05 NOTE — Progress Notes (Signed)
Port flushed with no blood return noted. No bruising or swelling at site. Bandaid applied and patient discharged in satisfactory condition. VVS stable with no signs or symptoms of distressed noted.

## 2022-08-05 NOTE — Patient Instructions (Addendum)
[facility]  Discharge Instructions  You were seen and examined today by Dr. Delton Coombes.  There is no spread of the cancer; however, the lymph node in your right groin is brighter that it has been. Dr. Delton Coombes will send you for a repeat biopsy.  Follow-up as scheduled.  Thank you for choosing [facility] to provide your oncology and hematology care.   To afford each patient quality time with our provider, please arrive at least 15 minutes before your scheduled appointment time. You may need to reschedule your appointment if you arrive late (10 or more minutes). Arriving late affects you and other patients whose appointments are after yours.  Also, if you miss three or more appointments without notifying the office, you may be dismissed from the clinic at the provider's discretion.    Again, thank you for choosing [facility].  Our hope is that these requests will decrease the amount of time that you wait before being seen by our physicians.   If you have a lab appointment with the [facility] please come in thru the Main Entrance and check in at the main information desk.           _____________________________________________________________  Should you have questions after your visit to [facility], please contact our office at [phone] and follow the prompts.  Our office hours are 8:00 a.m. to 4:30 p.m. Monday - Thursday and 8:00 a.m. to 2:30 p.m. Friday.  Please note that voicemails left after 4:00 p.m. may not be returned until the following business day.  We are closed weekends and all major holidays.  You do have access to a nurse 24-7, just call the main number to the clinic [phone] and do not press any options, hold on the line and a nurse will answer the phone.    For prescription refill requests, have your pharmacy contact our office and allow 72 hours.    Masks are optional in the  cancer centers. If you would like for your care team to wear a mask while they are taking care of you, please let them know. You may have one support person who is at least 68 years old accompany you for your appointments.

## 2022-08-05 NOTE — Progress Notes (Signed)
[name], [place] [address]   CLINIC:  Medical Oncology/Hematology  PCP:  Baruch Gouty, [place] / [place] [address] [phone]   REASON FOR VISIT:  Follow-up for high-grade DLBCL  PRIOR THERAPY: R-EPOCH x 6 cycles from 05/11/2019 to 08/23/2019  CURRENT THERAPY: Surveillance  BRIEF ONCOLOGIC HISTORY:  Oncology History  Diffuse large B-cell lymphoma of lymph nodes of multiple regions ([place])  04/26/2019 Initial Diagnosis   Diffuse large B-cell lymphoma of lymph nodes of multiple regions [facility])   05/10/2019 - 08/30/2019 Chemotherapy   Patient is on Treatment Plan : IP NON-HODGKINS LYMPHOMA R-EPOCH q21d     05/11/2019 - 05/11/2019 Chemotherapy   The patient had DOXOrubicin (ADRIAMYCIN) chemo injection 104 mg, 50 mg/m2, Intravenous,  Once, 0 of 6 cycles palonosetron (ALOXI) injection 0.25 mg, 0.25 mg, Intravenous,  Once, 0 of 6 cycles pegfilgrastim-jmdb (FULPHILA) injection 6 mg, 6 mg, Subcutaneous,  Once, 0 of 6 cycles vinCRIStine (ONCOVIN) 2 mg in sodium chloride 0.9 % 50 mL chemo infusion, 2 mg, Intravenous,  Once, 0 of 6 cycles cyclophosphamide (CYTOXAN) 1,540 mg in sodium chloride 0.9 % 250 mL chemo infusion, 750 mg/m2, Intravenous,  Once, 0 of 6 cycles fosaprepitant (EMEND) 150 mg, dexamethasone (DECADRON) 12 mg in sodium chloride 0.9 % 145 mL IVPB, , Intravenous,  Once, 0 of 6 cycles riTUXimab-pvvr (RUXIENCE) 800 mg in sodium chloride 0.9 % 250 mL (2.4242 mg/mL) infusion, 375 mg/m2, Intravenous,  Once, 0 of 6 cycles  for chemotherapy treatment.      CANCER STAGING:  Cancer Staging  No matching staging information was found for the patient.  INTERVAL HISTORY:  [name], a 67 y.o. male, seen for follow-up of high-grade lymphoma. He was last seen by me on 04/17/22.  Today, he states that he is doing well overall. His appetite level is at 100%. His energy level is at 100%. He denies any fatigue, night sweats,  or fevers. He denies any pain.  We reviewed his PET scan results from 07/29/22.   REVIEW OF SYSTEMS:  Review of Systems  Constitutional:  Negative for chills, fatigue and fever.  HENT:   Negative for lump/mass, mouth sores, nosebleeds, sore throat and trouble swallowing.   Respiratory:  Negative for cough and shortness of breath.   Cardiovascular:  Negative for chest pain, leg swelling and palpitations.  Gastrointestinal:  Negative for abdominal pain, constipation, diarrhea, nausea and vomiting.  Genitourinary:  Negative for bladder incontinence, difficulty urinating, dysuria, frequency, hematuria and nocturia.   Musculoskeletal:  Negative for arthralgias, back pain, flank pain, myalgias and neck pain.  Skin:  Negative for itching and rash.  Neurological:  Negative for dizziness, headaches and numbness.  Hematological:  Does not bruise/bleed easily.  Psychiatric/Behavioral:  Negative for depression, sleep disturbance and suicidal ideas. The patient is not nervous/anxious.   All other systems reviewed and are negative.   PAST MEDICAL/SURGICAL HISTORY:  Past Medical History:  Diagnosis Date   Anxiety    Depression    Diffuse large B cell lymphoma ([place]) 04/26/2019   Hyperlipidemia 02/01/2014   Hypertension    Leg DVT (deep venous thromboembolism), acute, bilateral ([place]) 04/18/2019   Port-A-Cath in place 04/27/2019   Past Surgical History:  Procedure Laterality Date   COLONOSCOPY WITH PROPOFOL N/A 12/25/2020   Procedure: COLONOSCOPY WITH PROPOFOL;  Surgeon: Daneil Dolin, MD;  [facility];  Service: Endoscopy;  Laterality: N/A;  2:15pm   CYSTOSCOPY W/  URETERAL STENT PLACEMENT Bilateral 04/21/2019   Procedure: CYSTOSCOPY WITH RETROGRADE PYELOGRAM/URETERAL STENT PLACEMENT;  Surgeon: Cleon Gustin, MD;  Location: [facility];  Service: Urology;  Laterality: Bilateral;   ETHMOIDECTOMY Right 02/04/2020   Procedure: ETHMOIDECTOMY;  Surgeon: Leta Baptist, MD;  Location: [place];  Service: ENT;  Laterality: Right;   INGUINAL LYMPH NODE BIOPSY Right 04/21/2019   Procedure: INGUINAL LYMPH NODE BIOPSY;  Surgeon: Virl Cagey, MD;  Location: [facility];  Service: General;  Laterality: Right;   MAXILLARY ANTROSTOMY Right 02/04/2020   Procedure: MAXILLARY ANTROSTOMY WITH TISSUE REMOVAL;  Surgeon: Leta Baptist, MD;  Location: [facility];  Service: ENT;  Laterality: Right;   PORTACATH PLACEMENT Left 05/07/2019   Procedure: INSERTION PORT-A-CATH (catheter left subclavian);  Surgeon: Virl Cagey, MD;  Location: [facility];  Service: General;  Laterality: Left;    SOCIAL HISTORY:  Social History   Socioeconomic History   Marital status: Married    Spouse name: Not on file   Number of children: 3   Years of education: 10   Highest education level: Not on file  Occupational History   Not on file  Tobacco Use   Smoking status: Former    Packs/day: 3.00    Years: 30.00    Total pack years: 90.00    Types: Cigarettes    Quit date: 04/27/1999    Years since quitting: 23.2   Smokeless tobacco: Never  Vaping Use   Vaping Use: Never used  Substance and Sexual Activity   Alcohol use: Yes    Comment: daily. 2-6 beer daily   Drug use: Never   Sexual activity: Yes  Other Topics Concern   Not on file  Social History Narrative   Not on file   Social Determinants of Health   Financial Resource Strain: Low Risk  (11/30/2020)   Overall Financial Resource Strain (CARDIA)    Difficulty of Paying Living Expenses: Not very hard  Food Insecurity: No Food Insecurity (11/30/2020)   Hunger Vital Sign    Worried About Running Out of Food in the Last Year: Never true    Ran Out of Food in the Last Year: Never true  Transportation Needs: No Transportation Needs (11/30/2020)   PRAPARE - Hydrologist (Medical): No    Lack of Transportation (Non-Medical): No  Physical Activity: Inactive (11/30/2020)   Exercise Vital Sign    Days  of Exercise per Week: 0 days    Minutes of Exercise per Session: 0 min  Stress: No Stress Concern Present (11/30/2020)   [place]    Feeling of Stress : Only a little  Social Connections: Moderately Integrated (11/30/2020)   Social Connection and Isolation Panel [NHANES]    Frequency of Communication with Friends and Family: More than three times a week    Frequency of Social Gatherings with Friends and Family: More than three times a week    Attends Religious Services: 1 to 4 times per year    Active Member of [employer] or Organizations: No    Attends Archivist Meetings: Never    Marital Status: Married  Human resources officer Violence: Not At Risk (11/30/2020)   Humiliation, Afraid, Rape, and Kick questionnaire    Fear of Current or Ex-Partner: No    Emotionally Abused: No    Physically Abused: No    Sexually Abused: No    FAMILY HISTORY:  Family History  Problem Relation Age of Onset   Diabetes Brother    Diabetes Mother    Leukemia Brother    Diabetes Son    Colon cancer Neg Hx     CURRENT MEDICATIONS:  Current Outpatient Medications  Medication Sig Dispense Refill   amLODipine (NORVASC) 10 MG tablet TAKE 1 TABLET BY MOUTH EVERY DAY 90 tablet 0   aspirin EC 81 MG tablet Take 81 mg by mouth daily. Swallow whole.     escitalopram (LEXAPRO) 10 MG tablet TAKE 1 TABLET BY MOUTH EVERY DAY 90 tablet 1   fluticasone (FLONASE) 50 MCG/ACT nasal spray Place 2 sprays into both nostrils daily. 16 g 6   LORazepam (ATIVAN) 0.5 MG tablet TAKE 1 TABLET (0.5 MG TOTAL) BY MOUTH EVERY 8 (EIGHT) HOURS. (Patient taking differently: Take 0.5 mg by mouth every 8 (eight) hours as needed for anxiety.) 60 tablet 3   losartan-hydrochlorothiazide (HYZAAR) 100-25 MG tablet Take 1 tablet by mouth daily. 90 tablet 1   metoprolol succinate (TOPROL-XL) 50 MG 24 hr tablet TAKE 1 TABLET BY MOUTH EVERY DAY WITH OR IMMEDIATELY FOLLOWING A MEAL 90  tablet 0   omega-3 acid ethyl esters (LOVAZA) 1 g capsule Take 2 capsules (2 g total) by mouth 2 (two) times daily. 360 capsule 1   No current facility-administered medications for this visit.   Facility-Administered Medications Ordered in Other Visits  Medication Dose Route Frequency Provider Last Rate Last Admin   0.9 %  sodium chloride infusion   Intravenous Continuous Derek Jack, MD 20 mL/hr at 08/26/19 1105 New Bag at 08/26/19 1105   sodium chloride flush (NS) 0.9 % injection 10 mL  10 mL Intravenous PRN Derek Jack, MD   10 mL at 08/26/19 1104   sodium chloride flush (NS) 0.9 % injection 10 mL  10 mL Intravenous PRN Derek Jack, MD   10 mL at 06/22/21 1107    ALLERGIES:  No Known Allergies  PHYSICAL EXAM:  Performance status (ECOG): 1 - Symptomatic but completely ambulatory  Vitals:   08/05/22 1128  BP: (!) 147/56  Pulse: (!) 55  Resp: 16  Temp: 98.2 F (36.8 C)  SpO2: 95%   Wt Readings from Last 3 Encounters:  08/05/22 104.1 kg (229 lb 6.4 oz)  04/30/22 102.5 kg (226 lb)  04/17/22 101.5 kg (223 lb 11.2 oz)   Physical Exam Vitals and nursing note reviewed. Exam conducted with a chaperone present.  Constitutional:      Appearance: Normal appearance.  Cardiovascular:     Rate and Rhythm: Normal rate and regular rhythm.     Pulses: Normal pulses.     Heart sounds: Normal heart sounds.  Pulmonary:     Effort: Pulmonary effort is normal.     Breath sounds: Normal breath sounds.  Abdominal:     Palpations: Abdomen is soft. There is no hepatomegaly, splenomegaly or mass.     Tenderness: There is no abdominal tenderness.  Lymphadenopathy:     Cervical: No cervical adenopathy.     Right cervical: No superficial cervical adenopathy.    Left cervical: No superficial cervical adenopathy.  Neurological:     General: No focal deficit present.     Mental Status: He is alert and oriented to person, place, and time.  Psychiatric:        Mood and  Affect: Mood normal.        Behavior: Behavior normal.      LABORATORY DATA:  I have reviewed the labs as listed.  Latest Ref Rng & Units 07/18/2022   10:28 AM 04/17/2022    1:07 PM 01/03/2022    9:04 AM  CBC  WBC 4.0 - 10.5 K/uL 6.5  7.9  6.8   Hemoglobin 13.0 - 17.0 g/dL 15.1  13.5  14.0   Hematocrit 39.0 - 52.0 % 44.2  39.1  41.0   Platelets 150 - 400 K/uL 198  251  183       Latest Ref Rng & Units 07/18/2022   10:28 AM 04/17/2022    1:07 PM 01/03/2022    9:04 AM  CMP  Glucose 70 - 99 mg/dL 127  204  130   BUN 8 - 23 mg/dL '19  25  24   '$ Creatinine 0.61 - 1.24 mg/dL 0.87  1.03  0.95   Sodium 135 - 145 mmol/L 137  138  139   Potassium 3.5 - 5.1 mmol/L 4.2  3.7  4.2   Chloride 98 - 111 mmol/L 100  101  104   CO2 22 - 32 mmol/L '29  31  29   '$ Calcium 8.9 - 10.3 mg/dL 9.0  9.7  9.1   Total Protein 6.5 - 8.1 g/dL 6.8  7.0  7.0   Total Bilirubin 0.3 - 1.2 mg/dL 0.7  0.8  0.1   Alkaline Phos 38 - 126 U/L 70  76  64   AST 15 - 41 U/L '25  26  22   '$ ALT 0 - 44 U/L 26  44  23     DIAGNOSTIC IMAGING:  I have independently reviewed the scans and discussed with the patient. NM PET Image Restag (PS) Skull Base To Thigh  Result Date: 07/30/2022 CLINICAL DATA:  Subsequent treatment strategy for diffuse large B-cell lymphoma. EXAM: NUCLEAR MEDICINE PET SKULL BASE TO THIGH TECHNIQUE: 12.6 mCi F-18 FDG was injected intravenously. Full-ring PET imaging was performed from the skull base to thigh after the radiotracer. CT data was obtained and used for attenuation correction and anatomic localization. Fasting blood glucose: 117 mg/dl COMPARISON:  04/11/2022 FINDINGS: Mediastinal blood pool activity: SUV max 2.0 Liver activity: SUV max 3.1 NECK: No hypermetabolic lymph nodes in the neck. Incidental CT findings: None. CHEST: No hypermetabolic mediastinal or hilar nodes. No suspicious pulmonary nodules on the CT scan. Incidental CT findings: Coronary artery calcification is evident. Mild atherosclerotic  calcification is noted in the wall of the thoracic aorta. Left-sided Port-A-Cath tip is positioned in the proximal SVC. ABDOMEN/PELVIS: Similar appearance of the hazy soft tissue attenuation in the central small bowel mesentery. Index left mesenteric node measured previously at 13 mm short axis is 13 mm again today on image 161/2. Hypermetabolic activity was identified as a generalized measurement of this soft tissue attenuation on the previous study and documented at SUV max = 4.4. This compares to SUV max = 2.2 today measured on image 153. A region of more confluent soft tissue density in the central small bowel mesentery on image 171/2 today measures 4.3 x 2.1 cm. Measuring this region is similar fashion on the prior study returns measurements of 6.4 x 2.6 cm. The index lymph node identified anterior pelvis previously at 8 mm is 9 mm today on image 210/2. SUV max = 2.3 today compared to 4.4 previously. 6 mm adjacent node on the same image measured previously is 6 mm again today on image 210/2. Index groin nodes measured previously are similar. Right-side node is 13 mm short axis on 252/2 which compares to 10 mm previously. SUV max =  9.7 today compared to 7.5 previously. The left-sided node is visible on image 249/2 measuring 14 mm today compared to 11 mm previously. SUV max = 7.1 today compared to 10.0 previously. Index node adjacent and just to the left of the base of the penis is stable at 9 mm today. SUV max = 2.8 today compared to 6.3 previously. No suspicious hypermetabolic uptake in the liver or spleen. Stable 19 mm left adrenal adenoma without hypermetabolism. Incidental CT findings: There is moderate atherosclerotic calcification of the abdominal aorta without aneurysm. SKELETON: No focal hypermetabolic activity to suggest skeletal metastasis. Incidental CT findings: No worrisome lytic or sclerotic osseous abnormality. 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 IMPRESSION: 1. Overall, no  clear trend towards improving or worsening disease. While central mesenteric disease is similar to prior, index lymph nodes in the pelvis measure slightly larger in the interval although generally show slight interval decrease in hypermetabolism. The index node in the right groin region does show interval slight increase in hypermetabolism. (Deauville 4). 2. Similar appearance of the hazy soft tissue attenuation in the central small bowel mesentery with associated low level and decreased hypermetabolism on today's study. 3. No new sites of hypermetabolic disease on today's study. 4. Stable left adrenal adenoma. 5.  Aortic Atherosclerosis (ICD10-I70.0). Electronically Signed   By: Misty Stanley M.D.   On: 07/30/2022 12:38     ASSESSMENT:  1.  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 -6 cycles of R-EPOCH from 05/11/2019 through 08/23/2019. -PET scan on 09/17/2019 showed central abdominal mass with SUV 2.9, measuring 10 x 3.7 cm.  Mildly enlarged right pelvic sidewall lymph node 12 mm SUV 1.7.  Areas of multifocal bone involvement with near complete resolution.  Deauville 2/3 in the five-point scale. -He was evaluated by lymphoma specialist Dr. Cassell Clement at the request [facility].  His case was discussed at tumor board.  No adjuvant radiation therapy or maintenance rituximab or transplant was recommended at this time. -PET scan on 12/14/2019 shows soft tissue and nodularity at the root and within the small bowel mesentery, grossly similar measuring 4.2 x 11.2 cm with SUV max 3.6, similar to 3.2 previously.  External iliac and inguinal lymph nodes do not show metabolic some low blood pool.  SUV 2.3. -Enlarging rounded soft tissue in the right maxillary sinus with periosteal thickening and mild hypermetabolic zone. -PET scan  on 03/27/2020 shows confluent soft tissue density in the central small bowel mesentery 11.6 x 4.2 cm with no significant change in size.  SUV 3.0, previously 3.6.  Small left para-aortic lymph node 1 cm with no FDG uptake.  Right external iliac lymph node, 10 mm with SUV 1.8, previously 2.3.  No new or progressive areas.     2.  Bilateral leg DVT: -Ultrasound Doppler on 04/18/2019 showed bilateral leg DVT. -He is on Eliquis since then. -Repeat Doppler on 12/29/2019 shows previous extensive right lower extremity DVT has resolved with minor residual nonocclusive right peroneal thrombus noted.  Very minimal residual thrombus burden.  Negative left leg DVT. - Repeat Dopplers negative on 04/03/2020.  Eliquis was discontinued.   3.  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    PLAN:  1.  Double hit lymphoma: - He does not have any  B symptoms. - Reviewed labs today which showed normal LFTs, LDH and CBC. - PET scan from 07/29/2022 showed right inguinal lymph node has increased SUV of 9.7, 7.5 previously, measuring 1.3 cm.  Other lymph nodes are more or less stable.  I have recommended biopsy of this lymph node.  We will obtain core biopsy by IR.  RTC after biopsy.  If it is negative, we can scan him every 6 months.       Orders placed this encounter:  Orders Placed This Encounter  Procedures   CT BIOPSY    I,Alexis Herring,acting as a scribe for Derek Jack, MD.,have documented all relevant documentation on the behalf of Derek Jack, MD,as directed by  Derek Jack, MD while in the presence of Derek Jack, MD.  I, Derek Jack MD, have reviewed the above documentation for accuracy and completeness, and I agree with the above.    Derek Jack, MD [place] [phone]

## 2022-08-06 ENCOUNTER — Encounter: Payer: Self-pay | Admitting: General Practice

## 2022-08-06 NOTE — Progress Notes (Signed)
Kevin Edouard, MD  Kevin [name], NT OK for US guided core bx of right inguinal LN. Slightly hotter by PET than left inguinal LN.  GY       Previous Messages    ----- Message ----- From: Kevin [name], NT Sent: 08/05/2022   1:21 PM EST To: [facility] Procedure Requests Subject: CT Biopsy                                      Procedure: CT Biopsy  Reason: R Inguinal Lymph Node Biospy Dx: Diffuse large B-cell lymphoma of lymph nodes of multiple regions  History: NM in chart  Provider: Derek Jack, MD  Contact: [phone]

## 2022-08-14 ENCOUNTER — Telehealth: Payer: Self-pay | Admitting: Family Medicine

## 2022-08-14 NOTE — Telephone Encounter (Signed)
Left message for patient to call back and schedule Medicare Annual Wellness Visit (AWV) to be completed by video or phone.   Last AWV: : 11/30/2020   Please schedule at anytime with [place]     Any questions, please contact me at [phone]   Thank you,   [facility] Amb[facility] Clinical Support for [place] [facility] ??[id number] or ??[id number]

## 2022-08-22 ENCOUNTER — Encounter (HOSPITAL_COMMUNITY): Payer: Self-pay | Admitting: Hematology

## 2022-08-25 ENCOUNTER — Other Ambulatory Visit: Payer: Self-pay | Admitting: Radiology

## 2022-08-25 DIAGNOSIS — C8330 Diffuse large B-cell lymphoma, unspecified site: Secondary | ICD-10-CM

## 2022-08-26 ENCOUNTER — Other Ambulatory Visit: Payer: Self-pay | Admitting: Internal Medicine

## 2022-08-27 ENCOUNTER — Ambulatory Visit (HOSPITAL_COMMUNITY)
Admission: RE | Admit: 2022-08-27 | Discharge: 2022-08-27 | Disposition: A | Discharge status: Home / Self Care | Payer: Medicare Other | Source: Ambulatory Visit | Attending: Hematology | Admitting: Hematology

## 2022-08-27 ENCOUNTER — Encounter (HOSPITAL_COMMUNITY): Payer: Self-pay

## 2022-08-27 DIAGNOSIS — C8330 Diffuse large B-cell lymphoma, unspecified site: Secondary | ICD-10-CM

## 2022-08-27 DIAGNOSIS — R590 Localized enlarged lymph nodes: Secondary | ICD-10-CM | POA: Diagnosis not present

## 2022-08-27 DIAGNOSIS — C8338 Diffuse large B-cell lymphoma, lymph nodes of multiple sites: Secondary | ICD-10-CM

## 2022-08-27 LAB — CBC WITH DIFFERENTIAL/PLATELET
Abs Immature Granulocytes: 0.02 10*3/uL (ref 0.00–0.07)
Basophils Absolute: 0 10*3/uL (ref 0.0–0.1)
Basophils Relative: 1 %
Eosinophils Absolute: 0.4 10*3/uL (ref 0.0–0.5)
Eosinophils Relative: 8 %
HCT: 43.6 % (ref 39.0–52.0)
Hemoglobin: 15 g/dL (ref 13.0–17.0)
Immature Granulocytes: 0 %
Lymphocytes Relative: 22 %
Lymphs Abs: 1.2 10*3/uL (ref 0.7–4.0)
MCH: 31.9 pg (ref 26.0–34.0)
MCHC: 34.4 g/dL (ref 30.0–36.0)
MCV: 92.8 fL (ref 80.0–100.0)
Monocytes Absolute: 0.7 10*3/uL (ref 0.1–1.0)
Monocytes Relative: 13 %
Neutro Abs: 3 10*3/uL (ref 1.7–7.7)
Neutrophils Relative %: 56 %
Platelets: 165 10*3/uL (ref 150–400)
RBC: 4.7 MIL/uL (ref 4.22–5.81)
RDW: 11.9 % (ref 11.5–15.5)
WBC: 5.4 10*3/uL (ref 4.0–10.5)
nRBC: 0 % (ref 0.0–0.2)

## 2022-08-27 LAB — PROTIME-INR
INR: 0.9 (ref 0.8–1.2)
Prothrombin Time: 12.5 seconds (ref 11.4–15.2)

## 2022-08-27 LAB — BASIC METABOLIC PANEL
Anion gap: 9 (ref 5–15)
BUN: 19 mg/dL (ref 8–23)
CO2: 26 mmol/L (ref 22–32)
Calcium: 9.3 mg/dL (ref 8.9–10.3)
Chloride: 102 mmol/L (ref 98–111)
Creatinine, Ser: 0.87 mg/dL (ref 0.61–1.24)
GFR, Estimated: >60 mL/min (ref >60)
Glucose, Bld: 113 mg/dL — ABNORMAL HIGH (ref 70–99)
Potassium: 4 mmol/L (ref 3.5–5.1)
Sodium: 137 mmol/L (ref 135–145)

## 2022-08-27 MED ORDER — SODIUM CHLORIDE 0.9 % IV SOLN: INTRAVENOUS | Status: DC

## 2022-08-27 MED ORDER — LIDOCAINE HCL 1 % IJ SOLN
INTRAMUSCULAR | Status: AC
  Administered 2022-08-27: 10 mL
  Filled 2022-08-27: qty 20

## 2022-08-27 NOTE — Procedures (Signed)
Vascular and Interventional Radiology Procedure Note  Patient: Kevin [name] DOB: [birth year] Medical Record Number: [id number] Note Date/Time: 08/27/22 12:16 PM   Performing Physician: Michaelle Birks, MD Assistant(s): None  Diagnosis: DLBCL   Procedure: RIGHT INGUINAL LYMPH NODE BIOPSY  Anesthesia: Local Anesthetic Complications: None Estimated Blood Loss: Minimal Specimens: Sent for Pathology  Findings:  Successful Ultrasound-guided biopsy of R inguinal LN. A total of 3 samples were obtained. Hemostasis of the tract was achieved using Manual Pressure.  Plan: Bed rest for 0 hours.  See detailed procedure note with images in PACS. The patient tolerated the procedure well without incident or complication and was returned to Recovery in stable condition.    Michaelle Birks, MD [facility]   Pager. [place]

## 2022-08-27 NOTE — Discharge Instructions (Addendum)
For questions /concerns may call Interventional Radiology at [phone] or  Interventional Radiology clinic [phone]   You may remove your dressing and shower tomorrow afternoon     Needle Biopsy, Care After This sheet gives you information about how to care for yourself after your procedure. Your health care provider may also give you more specific instructions. If you have problems or questions, contact your health careprovider. What can I expect after the procedure? After the procedure, it is common to have soreness, bruising, or mild pain atthe puncture site. This should go away in a few days. Follow these instructions at home: Needle insertion site care Wash your hands with soap and water before you change your bandage (dressing). If you cannot use soap and water, use hand sanitizer. Follow instructions from your health care provider about how to take care of your puncture site. This includes: When and how to change your dressing. When to remove your dressing. Check your puncture site every day for signs of infection. Check for: Redness, swelling, or pain. Fluid or blood. Pus or a bad smell. Warmth.   General instructions Return to your normal activities as told by your health care provider. Ask your health care provider what activities are safe for you. Do not take baths, swim, or use a hot tub until your health care provider approves. Ask your health care provider if you may take showers. You may only be allowed to take sponge baths. Take over-the-counter and prescription medicines only as told by your health care provider. Keep all follow-up visits as told by your health care provider. This is important. Contact a health care provider if: You have a fever. You have redness, swelling, or pain at the puncture site that lasts longer than a few days. You have fluid, blood, or pus coming from your puncture site. Your puncture site feels warm to the touch. Get help right away  if: You have severe bleeding from the puncture site. Summary After the procedure, it is common to have soreness, bruising, or mild pain at the puncture site. This should go away in a few days. Check your puncture site every day for signs of infection, such as redness, swelling, or pain. Get help right away if you have severe bleeding from your puncture site. This information is not intended to replace advice given to you by your health care provider. Make sure you discuss any questions you have with your healthcare provider. Document Revised: 12/16/2019 Document Reviewed: 12/16/2019 Elsevier Patient Education  [place].    Femoral Site Care This sheet gives you information about how to care for yourself after your procedure. Your health care provider may also give you more specific instructions. If you have problems or questions, contact your health care provider. What can I expect after the procedure? After the procedure, it is common to have: Bruising that usually fades within 1-2 weeks. Tenderness at the site. Follow these instructions at home: Wound care Follow instructions from your health care provider about how to take care of your insertion site. Make sure you: Wash your hands with soap and water before you change your bandage (dressing). If soap and water are not available, use hand sanitizer. Change your dressing as told by your health care provider. Leave stitches (sutures), skin glue, or adhesive strips in place. These skin closures may need to stay in place for 2 weeks or longer. If adhesive strip edges start to loosen and curl up, you may trim the loose edges. Do  not remove adhesive strips completely unless your health care provider tells you to do that. Do not take baths, swim, or use a hot tub until your health care provider approves. You may shower 24-48 hours after the procedure or as told by your health care provider. Gently wash the site with plain soap and  water. Pat the area dry with a clean towel. Do not rub the site. This may cause bleeding. Do not apply powder or lotion to the site. Keep the site clean and dry. Check your femoral site every day for signs of infection. Check for: Redness, swelling, or pain. Fluid or blood. Warmth. Pus or a bad smell. Activity For the first 2-3 days after your procedure, or as long as directed: Avoid climbing stairs as much as possible. Do not squat. Do not lift anything that is heavier than 10 lb (4.5 kg), or the limit that you are told, until your health care provider says that it is safe. Rest as directed. Avoid sitting for a long time without moving. Get up to take short walks every 1-2 hours. Do not drive for 24 hours if you were given a medicine to help you relax (sedative). General instructions Take over-the-counter and prescription medicines only as told by your health care provider. Keep all follow-up visits as told by your health care provider. This is important. Contact a health care provider if you have: A fever or chills. You have redness, swelling, or pain around your insertion site. Get help right away if: The catheter insertion area swells very fast. You pass out. You suddenly start to sweat or your skin gets clammy. The catheter insertion area is bleeding, and the bleeding does not stop when you hold steady pressure on the area. The area near or just beyond the catheter insertion site becomes pale, cool, tingly, or numb. These symptoms may represent a serious problem that is an emergency. Do not wait to see if the symptoms will go away. Get medical help right away. Call your local emergency services (911 in the [place] Do not drive yourself to the hospital. Summary After the procedure, it is common to have bruising that usually fades within 1-2 weeks. Check your femoral site every day for signs of infection. Do not lift anything that is heavier than 10 lb (4.5 kg), or the limit that  you are told, until your health care provider says that it is safe. This information is not intended to replace advice given to you by your health care provider. Make sure you discuss any questions you have with your health care provider. Document Revised: 06/30/2017 Document Reviewed: 06/30/2017 Elsevier Patient Education  2020 [employer].

## 2022-08-27 NOTE — Progress Notes (Signed)
Patient ID: Kevin [name], male   DOB: May 23, 1956, 67 y.o.   MRN: [id number] Pt scheduled for US guided right inguinal lymph node biopsy today. Risks and benefits of procedure was discussed with the patient /daughter including, but not limited to bleeding, infection, damage to adjacent structures or low yield requiring additional tests.  All of the questions were answered and there is agreement to proceed.  Consent signed and in chart. Local anesthetic only planned.

## 2022-08-28 ENCOUNTER — Ambulatory Visit: Payer: Medicare Other | Admitting: Hematology

## 2022-08-29 ENCOUNTER — Encounter: Payer: Self-pay | Admitting: Family Medicine

## 2022-08-29 ENCOUNTER — Ambulatory Visit (INDEPENDENT_AMBULATORY_CARE_PROVIDER_SITE_OTHER): Payer: Medicare Other | Admitting: Family Medicine

## 2022-08-29 VITALS — BP 123/49 | HR 52 | Temp 97.2°F | Ht 66.0 in | Wt 226.2 lb

## 2022-08-29 DIAGNOSIS — R011 Cardiac murmur, unspecified: Secondary | ICD-10-CM

## 2022-08-29 DIAGNOSIS — I10 Essential (primary) hypertension: Secondary | ICD-10-CM

## 2022-08-29 DIAGNOSIS — F419 Anxiety disorder, unspecified: Secondary | ICD-10-CM | POA: Diagnosis not present

## 2022-08-29 DIAGNOSIS — E781 Pure hyperglyceridemia: Secondary | ICD-10-CM | POA: Diagnosis not present

## 2022-08-29 LAB — SURGICAL PATHOLOGY

## 2022-08-29 MED ORDER — LOSARTAN POTASSIUM-HCTZ 100-25 MG PO TABS: 1.0000 | ORAL_TABLET | Freq: Every day | ORAL | 1 refills | Status: DC

## 2022-08-29 MED ORDER — FENOFIBRATE 145 MG PO TABS: 145.0000 mg | ORAL_TABLET | Freq: Every day | ORAL | 3 refills | Status: DC

## 2022-08-29 MED ORDER — ESCITALOPRAM OXALATE 10 MG PO TABS: 10.0000 mg | ORAL_TABLET | Freq: Every day | ORAL | 1 refills | Status: DC

## 2022-08-29 MED ORDER — METOPROLOL SUCCINATE ER 25 MG PO TB24: 25.0000 mg | ORAL_TABLET | Freq: Every day | ORAL | 3 refills | Status: DC

## 2022-08-29 NOTE — Patient Instructions (Signed)

## 2022-08-29 NOTE — Progress Notes (Signed)
Subjective:  Patient ID: JAMEL WINKER, male    DOB: 09/17/1955, 67 y.o.   MRN: [id number]  Patient Care Team: Baruch Gouty, FNP as PCP - General (Family Medicine) Gala Romney Cristopher Estimable, MD as Consulting Physician (Gastroenterology) Derek Jack, MD as Consulting Physician (Hematology)   Chief Complaint:  Establish Care [facility] patient ) and Medical Management of Chronic Issues (6 month follow up )   HPI: EYTAN KRAVETS is a 67 y.o. male presenting on 08/29/2022 for [facility] patient ) and Medical Management of Chronic Issues (6 month follow up )    1. Anxiety Pt reports he has been doing well on Lexapro. No adverse side effects.     08/29/2022    3:32 PM 04/30/2022    9:41 AM 02/26/2022    3:52 PM 01/11/2022    2:22 PM  GAD 7 : Generalized Anxiety Score  Nervous, Anxious, on Edge 0 0 0 0  Control/stop worrying 0 0 0 0  Worry too much - different things 0 0 0 0  Trouble relaxing 0 0 0 0  Restless 0 0 0 0  Easily annoyed or irritable 0 0 0 0  Afraid - awful might happen 0 0 0 0  Total GAD 7 Score 0 0 0 0  Anxiety Difficulty Not difficult at all Not difficult at all Not difficult at all Not difficult at all       08/29/2022    3:31 PM 04/30/2022    9:41 AM 02/26/2022    3:52 PM 01/11/2022    2:22 PM 07/17/2021    3:43 PM  Depression screen PHQ 2/9  Decreased Interest 0 0 0 0 0  Down, Depressed, Hopeless 0 0 0 0 0  PHQ - 2 Score 0 0 0 0 0  Altered sleeping 0 0 0 0 0  Tired, decreased energy 0 0 0 0 0  Change in appetite 0 0 0 0 0  Feeling bad or failure about yourself  0 0 0 0 0  Trouble concentrating 0 0 0 0 0  Moving slowly or fidgety/restless 0 0 0 0 0  Suicidal thoughts 0 0 0 0 0  PHQ-9 Score 0 0 0 0 0  Difficult doing work/chores Not difficult at all Not difficult at all Not difficult at all Not difficult at all Not difficult at all     2. Essential hypertension Currently on toprol, amlodipine, and hyzaar. States he has been on this regimen  for several years and has been tolerating well. Denies dizziness, weakness, fatigue, or syncope. Noted to be bradycardic in office with a low diastolic reading as well. Asymptomatic.   3. Hypertriglyceridemia Was prescribed Lovaza but states he can not afford this as it was over $100 at the pharmacy. Has not tried tricor in the past. He does not follow a diet or exercise routine.    Relevant past medical, surgical, family, and social history reviewed and updated as indicated.  Allergies and medications reviewed and updated. Data reviewed: Chart in Epic.   Past Medical History:  Diagnosis Date   Anxiety    Depression    Diffuse large B cell lymphoma ([place]) 04/26/2019   Hyperlipidemia 02/01/2014   Hypertension    Leg DVT (deep venous thromboembolism), acute, bilateral ([place]) 04/18/2019   Port-A-Cath in place 04/27/2019    Past Surgical History:  Procedure Laterality Date   COLONOSCOPY WITH PROPOFOL N/A 12/25/2020   Procedure: COLONOSCOPY WITH PROPOFOL;  Surgeon: Manus Rudd  M, MD;  [facility];  Service: Endoscopy;  Laterality: N/A;  2:15pm   CYSTOSCOPY W/ URETERAL STENT PLACEMENT Bilateral 04/21/2019   Procedure: CYSTOSCOPY WITH RETROGRADE PYELOGRAM/URETERAL STENT PLACEMENT;  Surgeon: Cleon Gustin, MD;  Location: [facility];  Service: Urology;  Laterality: Bilateral;   ETHMOIDECTOMY Right 02/04/2020   Procedure: ETHMOIDECTOMY;  Surgeon: Leta Baptist, MD;  Location: [place];  Service: ENT;  Laterality: Right;   INGUINAL LYMPH NODE BIOPSY Right 04/21/2019   Procedure: INGUINAL LYMPH NODE BIOPSY;  Surgeon: Virl Cagey, MD;  Location: [facility];  Service: General;  Laterality: Right;   MAXILLARY ANTROSTOMY Right 02/04/2020   Procedure: MAXILLARY ANTROSTOMY WITH TISSUE REMOVAL;  Surgeon: Leta Baptist, MD;  Location: [place];  Service: ENT;  Laterality: Right;   PORTACATH PLACEMENT Left 05/07/2019   Procedure: INSERTION PORT-A-CATH (catheter left  subclavian);  Surgeon: Virl Cagey, MD;  Location: [facility];  Service: General;  Laterality: Left;    Social History   Socioeconomic History   Marital status: Married    Spouse name: Not on file   Number of children: 3   Years of education: 10   Highest education level: Not on file  Occupational History   Not on file  Tobacco Use   Smoking status: Former    Packs/day: 3.00    Years: 30.00    Total pack years: 90.00    Types: Cigarettes    Quit date: 04/27/1999    Years since quitting: 23.3   Smokeless tobacco: Never  Vaping Use   Vaping Use: Never used  Substance and Sexual Activity   Alcohol use: Yes    Comment: daily. 2-6 beer daily   Drug use: Never   Sexual activity: Yes  Other Topics Concern   Not on file  Social History Narrative   Not on file   Social Determinants of Health   Financial Resource Strain: Low Risk  (11/30/2020)   Overall Financial Resource Strain (CARDIA)    Difficulty of Paying Living Expenses: Not very hard  Food Insecurity: No Food Insecurity (11/30/2020)   Hunger Vital Sign    Worried About Running Out of Food in the Last Year: Never true    Ran Out of Food in the Last Year: Never true  Transportation Needs: No Transportation Needs (11/30/2020)   PRAPARE - Hydrologist (Medical): No    Lack of Transportation (Non-Medical): No  Physical Activity: Inactive (11/30/2020)   Exercise Vital Sign    Days of Exercise per Week: 0 days    Minutes of Exercise per Session: 0 min  Stress: No Stress Concern Present (11/30/2020)   [place]    Feeling of Stress : Only a little  Social Connections: Moderately Integrated (11/30/2020)   Social Connection and Isolation Panel [NHANES]    Frequency of Communication with Friends and Family: More than three times a week    Frequency of Social Gatherings with Friends and Family: More than three times a week    Attends  Religious Services: 1 to 4 times per year    Active Member of [employer] or Organizations: No    Attends Archivist Meetings: Never    Marital Status: Married  Human resources officer Violence: Not At Risk (11/30/2020)   Humiliation, Afraid, Rape, and Kick questionnaire    Fear of Current or Ex-Partner: No    Emotionally Abused: No    Physically  Abused: No    Sexually Abused: No    Outpatient Encounter Medications as of 08/29/2022  Medication Sig   amLODipine (NORVASC) 10 MG tablet TAKE 1 TABLET BY MOUTH EVERY DAY   aspirin EC 81 MG tablet Take 81 mg by mouth daily. Swallow whole.   fenofibrate (TRICOR) 145 MG tablet Take 1 tablet (145 mg total) by mouth daily.   fluticasone (FLONASE) 50 MCG/ACT nasal spray Place 2 sprays into both nostrils daily.   LORazepam (ATIVAN) 0.5 MG tablet TAKE 1 TABLET (0.5 MG TOTAL) BY MOUTH EVERY 8 (EIGHT) HOURS. (Patient taking differently: Take 0.5 mg by mouth every 8 (eight) hours as needed for anxiety.)   metoprolol succinate (TOPROL-XL) 25 MG 24 hr tablet Take 1 tablet (25 mg total) by mouth daily.   omega-3 acid ethyl esters (LOVAZA) 1 g capsule Take 2 capsules (2 g total) by mouth 2 (two) times daily.   [DISCONTINUED] escitalopram (LEXAPRO) 10 MG tablet TAKE 1 TABLET BY MOUTH EVERY DAY   [DISCONTINUED] losartan-hydrochlorothiazide (HYZAAR) 100-25 MG tablet Take 1 tablet by mouth daily.   [DISCONTINUED] metoprolol succinate (TOPROL-XL) 50 MG 24 hr tablet TAKE 1 TABLET BY MOUTH EVERY DAY WITH OR IMMEDIATELY FOLLOWING A MEAL   escitalopram (LEXAPRO) 10 MG tablet Take 1 tablet (10 mg total) by mouth daily.   losartan-hydrochlorothiazide (HYZAAR) 100-25 MG tablet Take 1 tablet by mouth daily.   Facility-Administered Encounter Medications as of 08/29/2022  Medication   0.9 %  sodium chloride infusion   sodium chloride flush (NS) 0.9 % injection 10 mL   sodium chloride flush (NS) 0.9 % injection 10 mL    No Known Allergies  Review of Systems   Constitutional:  Negative for activity change, appetite change, chills, diaphoresis, fatigue, fever and unexpected weight change.  HENT: Negative.    Eyes: Negative.  Negative for photophobia and visual disturbance.  Respiratory:  Negative for cough, chest tightness and shortness of breath.   Cardiovascular:  Negative for chest pain, palpitations and leg swelling.  Gastrointestinal:  Negative for abdominal pain, blood in stool, constipation, diarrhea, nausea and vomiting.  Endocrine: Negative.  Negative for polydipsia, polyphagia and polyuria.  Genitourinary:  Negative for decreased urine volume, difficulty urinating, dysuria, frequency and urgency.  Musculoskeletal:  Negative for arthralgias and myalgias.  Skin: Negative.   Allergic/Immunologic: Negative.   Neurological:  Negative for dizziness, tremors, seizures, syncope, facial asymmetry, speech difficulty, weakness, light-headedness, numbness and headaches.  Hematological: Negative.   Psychiatric/Behavioral:  Negative for confusion, hallucinations, sleep disturbance and suicidal ideas.   All other systems reviewed and are negative.       Objective:  BP (!) 123/49   Pulse (!) 52   Temp (!) 97.2 F (36.2 C) (Temporal)   Ht '5\' 6"'$  (1.676 m)   Wt 226 lb 3.2 oz (102.6 kg)   SpO2 92%   BMI 36.51 kg/m    Wt Readings from Last 3 Encounters:  08/29/22 226 lb 3.2 oz (102.6 kg)  08/27/22 229 lb (103.9 kg)  08/05/22 229 lb 6.4 oz (104.1 kg)    Physical Exam Vitals and nursing note reviewed.  Constitutional:      General: He is not in acute distress.    Appearance: Normal appearance. He is well-developed and well-groomed. He is obese. He is not ill-appearing, toxic-appearing or diaphoretic.  HENT:     Head: Normocephalic and atraumatic.     Jaw: There is normal jaw occlusion.     Right Ear: Hearing normal.  Left Ear: Hearing normal.     Nose: Nose normal.     Mouth/Throat:     Lips: Pink.     Mouth: Mucous membranes are  moist.     Pharynx: Oropharynx is clear. Uvula midline.  Eyes:     General: Lids are normal.     Conjunctiva/sclera: Conjunctivae normal.     Pupils: Pupils are equal, round, and reactive to light.  Neck:     Thyroid: No thyroid mass, thyromegaly or thyroid tenderness.     Vascular: No carotid bruit or JVD.     Trachea: Trachea and phonation normal.  Cardiovascular:     Rate and Rhythm: Regular rhythm. Bradycardia present.     Chest Wall: PMI is not displaced.     Pulses: Normal pulses.     Heart sounds: Murmur heard.     Systolic (loudest at LUSB) murmur is present with a grade of 2/6.     No friction rub. No gallop.  Pulmonary:     Effort: Pulmonary effort is normal. No respiratory distress.     Breath sounds: Normal breath sounds. No wheezing.  Chest:     Comments: Port-a-cath in place Abdominal:     General: There is no abdominal bruit.     Palpations: There is no hepatomegaly or splenomegaly.  Musculoskeletal:        General: Normal range of motion.     Cervical back: Normal range of motion and neck supple.     Right lower leg: No edema.     Left lower leg: No edema.  Lymphadenopathy:     Cervical: No cervical adenopathy.  Skin:    General: Skin is warm and dry.     Capillary Refill: Capillary refill takes less than 2 seconds.     Coloration: Skin is not cyanotic, jaundiced or pale.     Findings: No rash.  Neurological:     General: No focal deficit present.     Mental Status: He is alert and oriented to person, place, and time.     Sensory: Sensation is intact.     Motor: Motor function is intact.     Coordination: Coordination is intact.     Gait: Gait is intact.     Deep Tendon Reflexes: Reflexes are normal and symmetric.  Psychiatric:        Attention and Perception: Attention and perception normal.        Mood and Affect: Mood and affect normal.        Speech: Speech normal.        Behavior: Behavior normal. Behavior is cooperative.        Thought Content:  Thought content normal.        Cognition and Memory: Cognition and memory normal.        Judgment: Judgment normal.     Results for orders placed or performed during the hospital encounter of 08/27/22  CBC with Differential/Platelet  Result Value Ref Range   WBC 5.4 4.0 - 10.5 K/uL   RBC 4.70 4.22 - 5.81 MIL/uL   Hemoglobin 15.0 13.0 - 17.0 g/dL   HCT 43.6 39.0 - 52.0 %   MCV 92.8 80.0 - 100.0 fL   MCH 31.9 26.0 - 34.0 pg   MCHC 34.4 30.0 - 36.0 g/dL   RDW 11.9 11.5 - 15.5 %   Platelets 165 150 - 400 K/uL   nRBC 0.0 0.0 - 0.2 %   Neutrophils Relative % 56 %   Neutro  Abs 3.0 1.7 - 7.7 K/uL   Lymphocytes Relative 22 %   Lymphs Abs 1.2 0.7 - 4.0 K/uL   Monocytes Relative 13 %   Monocytes Absolute 0.7 0.1 - 1.0 K/uL   Eosinophils Relative 8 %   Eosinophils Absolute 0.4 0.0 - 0.5 K/uL   Basophils Relative 1 %   Basophils Absolute 0.0 0.0 - 0.1 K/uL   Immature Granulocytes 0 %   Abs Immature Granulocytes 0.02 0.00 - 0.07 K/uL  Protime-INR  Result Value Ref Range   Prothrombin Time 12.5 11.4 - 15.2 seconds   INR 0.9 0.8 - 1.2  Basic metabolic panel  Result Value Ref Range   Sodium 137 135 - 145 mmol/L   Potassium 4.0 3.5 - 5.1 mmol/L   Chloride 102 98 - 111 mmol/L   CO2 26 22 - 32 mmol/L   Glucose, Bld 113 (H) 70 - 99 mg/dL   BUN 19 8 - 23 mg/dL   Creatinine, Ser 0.87 0.61 - 1.24 mg/dL   Calcium 9.3 8.9 - 10.3 mg/dL   GFR, Estimated >60 >60 mL/min   Anion gap 9 5 - 15  Surgical pathology  Result Value Ref Range   SURGICAL PATHOLOGY      SURGICAL PATHOLOGY CASE: [id number] PATIENT: Levon Hedger Surgical Pathology Report     Clinical History: DLBCL (crm)     FINAL MICROSCOPIC DIAGNOSIS:  A. LYMPH NODE (NOS), NEEDLE CORE BIOPSY: -Benign lymph nodal tissue -See comment  COMMENT:  The sections show several needle core biopsy fragments of lymph nodal tissue displaying preservation of the architecture.  The lymphoid tissue is primarily composed of small  round to slightly irregular lymphocytes admixed with scattered histiocytes.  In some areas, small vaguely nodular lymphoid areas are present. Vascularity appears increased in some areas.  Flow cytometric analysis was performed [facility] [phone]) and shows predominance of T cells displaying nonspecific changes.  No monoclonal B-cell population identified.  In addition, immunohistochemical stains were performed with appropriate controls including CD20, PAX5, CD79a, CD3, CD5, CD21, CD10, cyclin D1, and Bcl-2. There is a mixture of T and B cells in t heir respective compartments with predominance of T cells.  B cells are mostly seen in scattered small aggregates with no significant CD10 or cyclin D1 positivity.  The latter aggregates display admixed follicular dendritic networks as seen with CD21 consistent with primary lymphoid follicles.  Bcl-2 is diffusely positive. The overall morphologic and phenotypic features are consistent with benign lymph nodal tissue.  If lymphadenopathy is persistent or progressive, excisional biopsy is recommended. Diagnosis  GROSS DESCRIPTION:  Received fresh are 5 cores of pink-gray soft tissue which range from 0.4 x 0.1 cm to 0.7 x 0.1 cm.  1 piece is placed in RPMI for possible flow cytometry, and the remaining specimen is submitted in 1 block for routine histology.  SW 08/27/2022   Final Diagnosis performed by Susanne Greenhouse, MD.   Electronically signed 08/29/2022 Technical and / or Professional components performed at [facility], [place] [address] [place], [place] [address].  Immunohistochemistry Technical component (if applicable) was performed at [facility]. [address], [place], [place], [place] [address].   IMMUNOHISTOCHEMISTRY DISCLAIMER (if applicable): Some of these immunohistochemical stains may have been developed and the performance characteristics determine by [facility]. Some may not have  been cleared or approved by the U.S. Food and Drug Administration. The FDA has determined that such clearance or approval is not necessary. This test is used  for clinical purposes. It should not be regarded as investigational or for research. This laboratory is certified under the [place] (CLIA-88) as qualified to perform high complexity clinical laboratory testing.  The controls stained appropriately.   Surgical pathology  Result Value Ref Range   SURGICAL PATHOLOGY      Surgical Pathology CASE: [id number] PATIENT: Levon Hedger Flow Pathology Report     Clinical history: DLBCL     DIAGNOSIS:  -Predominance of T cells with nonspecific changes -No monoclonal B-cell population identified   GATING AND PHENOTYPIC ANALYSIS:  Gated population: Flow cytometric immunophenotyping is performed using antibodies to the antigens listed in the table below. Electronic gates are placed around a cell cluster displaying light scatter properties corresponding to: lymphocytes  Abnormal Cells in gated population: N/A  Phenotype of Abnormal Cells: N/A                       Lymphoid Antigens       Myeloid Antigens Miscellaneous CD2  tested    CD10 tested    CD11b     ND   CD45 tested CD3  tested    CD19 tested    CD11c     ND   HLA-Dr    ND CD4  tested    CD20 tested    CD13 ND   CD34 tested CD5  tested    CD22 ND   CD14 ND   CD38 tested CD7  tested    CD79b     ND   CD15 ND   CD138     ND CD8  tested    CD103     ND   CD16 ND   TdT   ND CD25 ND   CD200     tested    CD33 ND   CD123     ND TCRab     ND   sKappa    tested    CD64 ND   CD41 ND TCRgd     tested    sLambda   tested    CD117     ND   CD61 ND CD56 tested    cKappa    ND   MPO  ND   CD71 ND CD57 ND   cLambda   ND        [id number]        GROSS DESCRIPTION:  Reference tissue case [id number].    Final Diagnosis performed by Susanne Greenhouse, MD.   Electronically  signed 08/29/2022 Technical and / or Professional components performed at [facility], [place] [address], [place], [place] [address].  The above tests were developed and their performance characteristics determined by the [facility] system for the physical and immunophenotypic characterization of cell populations. They have not been cleared by the U.S. Food and Drug administration. The  FDA has determined that such clearance or approval is not necessary. This test is used for clinical purposes. It should not be  regarded as investigational or for research        Pertinent labs & imaging results that were available during my care of the patient were reviewed by me and considered in my medical decision making.  Assessment & Plan:  Margarita was seen today for establish care and medical management of chronic issues.  Diagnoses and all orders for this visit:  Essential hypertension BP lower normal and HR low today. Will decrease BB dosing. DASH diet and  exercise encouraged.  -     losartan-hydrochlorothiazide (HYZAAR) 100-25 MG tablet; Take 1 tablet by mouth daily. -     metoprolol succinate (TOPROL-XL) 25 MG 24 hr tablet; Take 1 tablet (25 mg total) by mouth daily.  Anxiety Doing well on below, will continue.  -     escitalopram (LEXAPRO) 10 MG tablet; Take 1 tablet (10 mg total) by mouth daily.  Hypertriglyceridemia Lovaza not covered by insurance. Will change to fenofibrate to see if covered. Will get fasting labs at next visit.  -     fenofibrate (TRICOR) 145 MG tablet; Take 1 tablet (145 mg total) by mouth daily.  Cardiac murmur No previous echo, will obtain for structural heart disease evaluation.  -     ECHOCARDIOGRAM COMPLETE; Future     Continue all other maintenance medications.  Follow up plan: Return in 3 months (on 11/27/2022), or if symptoms worsen or fail to improve, for chronic follow up, fasting labs schedule AWV.   Continue healthy lifestyle  choices, including diet (rich in fruits, vegetables, and lean proteins, and low in salt and simple carbohydrates) and exercise (at least 30 minutes of moderate physical activity daily).  Educational handout given for DASH diet, HTN  The above assessment and management plan was discussed with the patient. The patient verbalized understanding of and has agreed to the management plan. Patient is aware to call the clinic if they develop any new symptoms or if symptoms persist or worsen. Patient is aware when to return to the clinic for a follow-up visit. Patient educated on when it is appropriate to go to the emergency department.   Monia Pouch, FNP-C [facility] [phone]

## 2022-09-03 NOTE — Progress Notes (Signed)
[facility] [address] [place] [address]    Clinic Day:  09/04/2022  Referring physician: Baruch Gouty, FNP  Patient Care Team: Baruch Gouty, FNP as PCP - General (Family Medicine) Gala Romney Cristopher Estimable, MD as Consulting Physician (Gastroenterology) Derek Jack, MD as Consulting Physician (Hematology)   ASSESSMENT & PLAN:   Assessment: 1.  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 -6 cycles of R-EPOCH from 05/11/2019 through 08/23/2019. -PET scan on 09/17/2019 showed central abdominal mass with SUV 2.9, measuring 10 x 3.7 cm.  Mildly enlarged right pelvic sidewall lymph node 12 mm SUV 1.7.  Areas of multifocal bone involvement with near complete resolution.  [place] 2/3 in the five-point scale. -He was evaluated by lymphoma specialist Dr. Cassell Clement at the request [facility].  His case was discussed at tumor board.  No adjuvant radiation therapy or maintenance rituximab or transplant was recommended at this time. -PET scan on 12/14/2019 shows soft tissue and nodularity at the root and within the small bowel mesentery, grossly similar measuring 4.2 x 11.2 cm with SUV max 3.6, similar to 3.2 previously.  External iliac and inguinal lymph nodes do not show metabolic some low blood pool.  SUV 2.3. -Enlarging rounded soft tissue in the right maxillary sinus with periosteal thickening and mild hypermetabolic zone. -PET scan on 03/27/2020 shows confluent soft tissue density in the central small bowel mesentery 11.6 x 4.2 cm with no significant change in size.  SUV 3.0, previously 3.6.  Small left para-aortic lymph node 1 cm with no FDG uptake.  Right external iliac lymph node, 10 mm with SUV 1.8, previously 2.3.  No new or progressive areas.     2.  Bilateral leg DVT: -Ultrasound  Doppler on 04/18/2019 showed bilateral leg DVT. -He is on Eliquis since then. -Repeat Doppler on 12/29/2019 shows previous extensive right lower extremity DVT has resolved with minor residual nonocclusive right peroneal thrombus noted.  Very minimal residual thrombus burden.  Negative left leg DVT. - Repeat Dopplers negative on 04/03/2020.  Eliquis was discontinued.   3.  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  Plan: 1.  Double hit lymphoma: - He does not report any B symptoms.  He is continuing to work full-time. - Previous labs from 08/05/2022 showed normal LDH.  CBC from 08/27/2022 was normal. - PET scan on 07/29/2022 showed right inguinal lymph node increased SUV 9.7, 7.5 previously measuring 1.3 cm.  The lymph nodes are more or less stable. - He underwent right inguinal lymph node biopsy on 08/27/2022. - I discussed pathology report which was negative for lymphoma.  It showed benign lymph node tissue. - I have recommended that he follow-up with [place] in 6 months with repeat labs, exam and PET scan.     Orders Placed This Encounter  Procedures   NM PET Image Restag (PS) Skull Base To Thigh    Standing Status:   Future    Standing Expiration Date:   09/04/2023    Order Specific Question:   If indicated for the ordered procedure, I authorize the administration of a radiopharmaceutical per Radiology protocol    Answer:   Yes    Order Specific Question:   Preferred imaging location?    Answer:  [facility]    Order Specific Question:   Release to patient    Answer:   Immediate   Comprehensive metabolic panel    Standing Status:   Future    Standing Expiration Date:   09/04/2023    Order Specific Question:   Release to patient    Answer:   Immediate   Lactate dehydrogenase    Standing Status:   Future    Standing Expiration Date:   09/04/2023    Order Specific Question:    Release to patient    Answer:   Immediate   CBC    Standing Status:   Future    Standing Expiration Date:   09/04/2023      I,Alexis Herring,acting as a scribe for Derek Jack, MD.,have documented all relevant documentation on the behalf of Derek Jack, MD,as directed by  Derek Jack, MD while in the presence of Derek Jack, MD.   I, Derek Jack MD, have reviewed the above documentation for accuracy and completeness, and I agree with the above.   Derek Jack, MD   3/6/20243:49 PM  CHIEF COMPLAINT:   Diagnosis: high-grade DLBCL    Cancer Staging  No matching staging information was found for the patient.   Prior Therapy: R-EPOCH x 6 cycles from 05/11/2019 to 08/23/2019   Current Therapy:  Surveillance    HISTORY OF PRESENT ILLNESS:   Oncology History  Diffuse large B-cell lymphoma of lymph nodes of multiple regions ([place])  04/26/2019 Initial Diagnosis   Diffuse large B-cell lymphoma of lymph nodes of multiple regions ([place])   05/10/2019 - 08/30/2019 Chemotherapy   Patient is on Treatment Plan : IP NON-HODGKINS LYMPHOMA R-EPOCH q21d     05/11/2019 - 05/11/2019 Chemotherapy   The patient had DOXOrubicin (ADRIAMYCIN) chemo injection 104 mg, 50 mg/m2, Intravenous,  Once, 0 of 6 cycles palonosetron (ALOXI) injection 0.25 mg, 0.25 mg, Intravenous,  Once, 0 of 6 cycles pegfilgrastim-jmdb (FULPHILA) injection 6 mg, 6 mg, Subcutaneous,  Once, 0 of 6 cycles vinCRIStine (ONCOVIN) 2 mg in sodium chloride 0.9 % 50 mL chemo infusion, 2 mg, Intravenous,  Once, 0 of 6 cycles cyclophosphamide (CYTOXAN) 1,540 mg in sodium chloride 0.9 % 250 mL chemo infusion, 750 mg/m2, Intravenous,  Once, 0 of 6 cycles fosaprepitant (EMEND) 150 mg, dexamethasone (DECADRON) 12 mg in sodium chloride 0.9 % 145 mL IVPB, , Intravenous,  Once, 0 of 6 cycles riTUXimab-pvvr (RUXIENCE) 800 mg in sodium chloride 0.9 % 250 mL (2.4242 mg/mL) infusion, 375 mg/m2, Intravenous,   Once, 0 of 6 cycles  for chemotherapy treatment.       INTERVAL HISTORY:   Kevin [name] is a 67 y.o. male presenting to clinic today for follow up of high-grade DLBCL. He was last seen by me on 08/05/22.  Today, he states that he is doing well overall. His appetite level is at 100%. His energy level is at 100%. He denies any fevers, night sweats or weight loss.   PAST MEDICAL HISTORY:   Past Medical History: Past Medical History:  Diagnosis Date   Anxiety    Depression    Diffuse large B cell lymphoma ([place]) 04/26/2019   Hyperlipidemia 02/01/2014   Hypertension    Leg DVT (deep venous thromboembolism), acute, bilateral ([place]) 04/18/2019   Port-A-Cath in place 04/27/2019    Surgical History: Past Surgical History:  Procedure Laterality Date   COLONOSCOPY WITH PROPOFOL N/A 12/25/2020   Procedure: COLONOSCOPY WITH PROPOFOL;  Surgeon: Daneil Dolin, MD;  [facility];  Service: Endoscopy;  Laterality: N/A;  2:15pm   CYSTOSCOPY W/ URETERAL STENT PLACEMENT Bilateral 04/21/2019   Procedure: CYSTOSCOPY WITH RETROGRADE PYELOGRAM/URETERAL STENT PLACEMENT;  Surgeon: Cleon Gustin, MD;  Location: [facility];  Service: Urology;  Laterality: Bilateral;   ETHMOIDECTOMY Right 02/04/2020   Procedure: ETHMOIDECTOMY;  Surgeon: Leta Baptist, MD;  Location: [facility];  Service: ENT;  Laterality: Right;   INGUINAL LYMPH NODE BIOPSY Right 04/21/2019   Procedure: INGUINAL LYMPH NODE BIOPSY;  Surgeon: Virl Cagey, MD;  Location: [facility];  Service: General;  Laterality: Right;   MAXILLARY ANTROSTOMY Right 02/04/2020   Procedure: MAXILLARY ANTROSTOMY WITH TISSUE REMOVAL;  Surgeon: Leta Baptist, MD;  Location: [facility];  Service: ENT;  Laterality: Right;   PORTACATH PLACEMENT Left 05/07/2019   Procedure: INSERTION PORT-A-CATH (catheter left subclavian);  Surgeon: Virl Cagey, MD;  Location: [facility];  Service: General;  Laterality: Left;    Social History: Social  History   Socioeconomic History   Marital status: Married    Spouse name: Not on file   Number of children: 3   Years of education: 10   Highest education level: Not on file  Occupational History   Not on file  Tobacco Use   Smoking status: Former    Packs/day: 3.00    Years: 30.00    Total pack years: 90.00    Types: Cigarettes    Quit date: 04/27/1999    Years since quitting: 23.3   Smokeless tobacco: Never  Vaping Use   Vaping Use: Never used  Substance and Sexual Activity   Alcohol use: Yes    Comment: daily. 2-6 beer daily   Drug use: Never   Sexual activity: Yes  Other Topics Concern   Not on file  Social History Narrative   Not on file   Social Determinants of Health   Financial Resource Strain: Low Risk  (11/30/2020)   Overall Financial Resource Strain (CARDIA)    Difficulty of Paying Living Expenses: Not very hard  Food Insecurity: No Food Insecurity (11/30/2020)   Hunger Vital Sign    Worried About Running Out of Food in the Last Year: Never true    Ran Out of Food in the Last Year: Never true  Transportation Needs: No Transportation Needs (11/30/2020)   PRAPARE - Hydrologist (Medical): No    Lack of Transportation (Non-Medical): No  Physical Activity: Inactive (11/30/2020)   Exercise Vital Sign    Days of Exercise per Week: 0 days    Minutes of Exercise per Session: 0 min  Stress: No Stress Concern Present (11/30/2020)   [place]    Feeling of Stress : Only a little  Social Connections: Moderately Integrated (11/30/2020)   Social Connection and Isolation Panel [NHANES]    Frequency of Communication with Friends and Family: More than three times a week    Frequency of Social Gatherings with Friends and Family: More than three times a week    Attends Religious Services: 1 to 4 times per year    Active Member of [employer] or Organizations: No    Attends Theatre manager Meetings: Never    Marital Status: Married  Human resources officer Violence: Not At Risk (11/30/2020)   Humiliation, Afraid, Rape, and Kick questionnaire    Fear of Current or Ex-Partner: No    Emotionally Abused: No    Physically Abused: No    Sexually  Abused: No    Family History: Family History  Problem Relation Age of Onset   Diabetes Brother    Diabetes Mother    Leukemia Brother    Diabetes Son    Colon cancer Neg Hx     Current Medications:  Current Outpatient Medications:    amLODipine (NORVASC) 10 MG tablet, TAKE 1 TABLET BY MOUTH EVERY DAY, Disp: 90 tablet, Rfl: 0   aspirin EC 81 MG tablet, Take 81 mg by mouth daily. Swallow whole., Disp: , Rfl:    escitalopram (LEXAPRO) 10 MG tablet, Take 1 tablet (10 mg total) by mouth daily., Disp: 90 tablet, Rfl: 1   fenofibrate (TRICOR) 145 MG tablet, Take 1 tablet (145 mg total) by mouth daily., Disp: 90 tablet, Rfl: 3   fluticasone (FLONASE) 50 MCG/ACT nasal spray, Place 2 sprays into both nostrils daily., Disp: 16 g, Rfl: 6   LORazepam (ATIVAN) 0.5 MG tablet, TAKE 1 TABLET (0.5 MG TOTAL) BY MOUTH EVERY 8 (EIGHT) HOURS. (Patient taking differently: Take 0.5 mg by mouth every 8 (eight) hours as needed for anxiety.), Disp: 60 tablet, Rfl: 3   losartan-hydrochlorothiazide (HYZAAR) 100-25 MG tablet, Take 1 tablet by mouth daily., Disp: 90 tablet, Rfl: 1   metoprolol succinate (TOPROL-XL) 25 MG 24 hr tablet, Take 1 tablet (25 mg total) by mouth daily., Disp: 90 tablet, Rfl: 3   omega-3 acid ethyl esters (LOVAZA) 1 g capsule, Take 2 capsules (2 g total) by mouth 2 (two) times daily., Disp: 360 capsule, Rfl: 1 No current facility-administered medications for this visit.  Facility-Administered Medications Ordered in Other Visits:    0.9 %  sodium chloride infusion, , Intravenous, Continuous, Derek Jack, MD, Last Rate: 20 mL/hr at 08/26/19 1105, New Bag at 08/26/19 1105   sodium chloride flush (NS) 0.9 % injection 10 mL, 10  mL, Intravenous, PRN, Derek Jack, MD, 10 mL at 08/26/19 1104   sodium chloride flush (NS) 0.9 % injection 10 mL, 10 mL, Intravenous, PRN, Derek Jack, MD, 10 mL at 06/22/21 1107   Allergies: No Known Allergies  REVIEW OF SYSTEMS:   Review of Systems  All other systems reviewed and are negative.    VITALS:   Blood pressure (!) 152/52, pulse (!) 50, temperature 97.8 F (36.6 C), temperature source Oral, resp. rate 18, weight 223 lb (101.2 kg), SpO2 94 %.  Wt Readings from Last 3 Encounters:  09/04/22 223 lb (101.2 kg)  08/29/22 226 lb 3.2 oz (102.6 kg)  08/27/22 229 lb (103.9 kg)    Body mass index is 35.99 kg/m.  Performance status (ECOG): 1 - Symptomatic but completely ambulatory  PHYSICAL EXAM:   Physical Exam Vitals reviewed.  Constitutional:      Appearance: Normal appearance.  Cardiovascular:     Rate and Rhythm: Normal rate and regular rhythm.     Heart sounds: Normal heart sounds.  Pulmonary:     Effort: Pulmonary effort is normal.     Breath sounds: Normal breath sounds.  Abdominal:     Palpations: Abdomen is soft.  Neurological:     Mental Status: He is alert.  Psychiatric:        Mood and Affect: Mood normal.        Behavior: Behavior normal.     LABS:      Latest Ref Rng & Units 08/27/2022   11:20 AM 07/18/2022   10:28 AM 04/17/2022    1:07 PM  CBC  WBC 4.0 - 10.5 K/uL 5.4  6.5  7.9   Hemoglobin 13.0 - 17.0 g/dL 15.0  15.1  13.5   Hematocrit 39.0 - 52.0 % 43.6  44.2  39.1   Platelets 150 - 400 K/uL 165  198  251       Latest Ref Rng & Units 08/27/2022   11:20 AM 07/18/2022   10:28 AM 04/17/2022    1:07 PM  CMP  Glucose 70 - 99 mg/dL 113  127  204   BUN 8 - 23 mg/dL '19  19  25   '$ Creatinine 0.61 - 1.24 mg/dL 0.87  0.87  1.03   Sodium 135 - 145 mmol/L 137  137  138   Potassium 3.5 - 5.1 mmol/L 4.0  4.2  3.7   Chloride 98 - 111 mmol/L 102  100  101   CO2 22 - 32 mmol/L '26  29  31   '$ Calcium 8.9 - 10.3 mg/dL 9.3  9.0  9.7    Total Protein 6.5 - 8.1 g/dL  6.8  7.0   Total Bilirubin 0.3 - 1.2 mg/dL  0.7  0.8   Alkaline Phos 38 - 126 U/L  70  76   AST 15 - 41 U/L  25  26   ALT 0 - 44 U/L  26  44      No results found for: "CEA1", "CEA" / No results found for: "CEA1", "CEA" No results found for: "PSA1" No results found for: "[id number]" No results found for: "CAN125"  No results found for: "TOTALPROTELP", "ALBUMINELP", "A1GS", "A2GS", "BETS", "BETA2SER", "GAMS", "MSPIKE", "SPEI" Lab Results  Component Value Date   TIBC 335 11/24/2019   TIBC 284 10/25/2019   FERRITIN 220 11/24/2019   FERRITIN 236 10/25/2019   FERRITIN 318 04/17/2019   IRONPCTSAT 10 (L) 11/24/2019   IRONPCTSAT 8 (L) 10/25/2019   Lab Results  Component Value Date   LDH 132 07/18/2022   LDH 120 04/17/2022   LDH 123 01/03/2022     STUDIES:   [place] CORE BIOPSY (LYMPH NODES)  Result Date: 08/27/2022 INDICATION: Diffuse large B-cell lymphoma.  Adenopathy on recent PET. EXAM: ULTRASOUND-GUIDED RIGHT INGUINAL LYMPH NODE BIOPSY COMPARISON:  PET-CT, most recently 07/29/2022 MEDICATIONS: None ANESTHESIA/SEDATION: Local anesthetic was administered. COMPLICATIONS: None immediate. TECHNIQUE: Informed written consent was obtained from the patient and/or patient's representative after a discussion of the risks, benefits and alternatives to treatment. Questions regarding the procedure were encouraged and answered. Initial ultrasound scanning demonstrated interval decrease in size of RIGHT inguinal lymph node. An ultrasound image was saved for documentation purposes. The procedure was planned. A timeout was performed prior to the initiation of the procedure. The operative was prepped and draped in the usual sterile fashion, and a sterile drape was applied covering the operative field. A timeout was performed prior to the initiation of the procedure. Local anesthesia was provided with 1% lidocaine with epinephrine. Under direct ultrasound guidance, an 18 gauge core  needle device was utilized to obtain to obtain 5 core needle biopsies of the RIGHT inguinal lymph node. The samples were placed in saline and submitted to pathology. The needle was removed and superficial hemostasis was achieved with manual compression. Post procedure scan was negative for significant hematoma. A dressing was applied. The patient tolerated the procedure well without immediate postprocedural complication. IMPRESSION: Successful ultrasound guided biopsy of a RIGHT inguinal lymph node lymph node. Michaelle Birks, MD [facility] Radiology Electronically Signed   By: Michaelle Birks M.D.   On: 08/27/2022 14:44

## 2022-09-04 ENCOUNTER — Encounter: Payer: Self-pay | Admitting: Hematology

## 2022-09-04 ENCOUNTER — Inpatient Hospital Stay: Payer: Medicare Other | Attending: Hematology | Admitting: Hematology

## 2022-09-04 VITALS — BP 152/52 | HR 50 | Temp 97.8°F | Resp 18 | Wt 223.0 lb

## 2022-09-04 DIAGNOSIS — B449 Aspergillosis, unspecified: Secondary | ICD-10-CM | POA: Insufficient documentation

## 2022-09-04 DIAGNOSIS — Z79899 Other long term (current) drug therapy: Secondary | ICD-10-CM | POA: Insufficient documentation

## 2022-09-04 DIAGNOSIS — Z86718 Personal history of other venous thrombosis and embolism: Secondary | ICD-10-CM | POA: Diagnosis not present

## 2022-09-04 DIAGNOSIS — Z7901 Long term (current) use of anticoagulants: Secondary | ICD-10-CM | POA: Diagnosis not present

## 2022-09-04 DIAGNOSIS — C8338 Diffuse large B-cell lymphoma, lymph nodes of multiple sites: Secondary | ICD-10-CM | POA: Insufficient documentation

## 2022-09-04 NOTE — Patient Instructions (Signed)
[facility]  Discharge Instructions  You were seen and examined today by Dr. Delton Coombes.  Dr. Delton Coombes discussed your most recent lab work and biopsy which revealed that everything looks really good. Biopsy is benign.   Dr. Delton Coombes is ordering repeat PET scan.  Follow-up as scheduled in 6 months with labs and scan prior.    Thank you for choosing [facility] to provide your oncology and hematology care.   To afford each patient quality time with our provider, please arrive at least 15 minutes before your scheduled appointment time. You may need to reschedule your appointment if you arrive late (10 or more minutes). Arriving late affects you and other patients whose appointments are after yours.  Also, if you miss three or more appointments without notifying the office, you may be dismissed from the clinic at the provider's discretion.    Again, thank you for choosing [facility].  Our hope is that these requests will decrease the amount of time that you wait before being seen by our physicians.   If you have a lab appointment with the [facility] please come in thru the Main Entrance and check in at the main information desk.           _____________________________________________________________  Should you have questions after your visit to [facility], please contact our office at [phone] and follow the prompts.  Our office hours are 8:00 a.m. to 4:30 p.m. Monday - Thursday and 8:00 a.m. to 2:30 p.m. Friday.  Please note that voicemails left after 4:00 p.m. may not be returned until the following business day.  We are closed weekends and all major holidays.  You do have access to a nurse 24-7, just call the main number to the clinic [phone] and do not press any options, hold on the line and a nurse will answer the phone.    For prescription refill requests, have your pharmacy contact our  office and allow 72 hours.    Masks are optional in the cancer centers. If you would like for your care team to wear a mask while they are taking care of you, please let them know. You may have one support person who is at least 68 years old accompany you for your appointments.

## 2022-09-23 ENCOUNTER — Telehealth: Payer: Self-pay | Admitting: Family Medicine

## 2022-09-23 NOTE — Telephone Encounter (Signed)
Called patient to schedule Medicare Annual Wellness Visit (AWV). Left message for patient to call back and schedule Medicare Annual Wellness Visit (AWV).  Last date of AWV: 11/30/2020   Please schedule an appointment at any time with either Mickel Baas or [place], NHA's. .  If any questions, please contact me at [phone].  Thank you,  Colletta Maryland,  [facility] Program Direct Dial ??[phone]

## 2022-09-28 ENCOUNTER — Other Ambulatory Visit: Payer: Self-pay | Admitting: Family Medicine

## 2022-09-28 DIAGNOSIS — I10 Essential (primary) hypertension: Secondary | ICD-10-CM

## 2022-10-14 ENCOUNTER — Telehealth: Payer: Self-pay | Admitting: Family Medicine

## 2022-10-14 NOTE — Telephone Encounter (Signed)
Contacted Kevin [name] to schedule their annual wellness visit. Appointment made for 10/22/2022.  Thank you,  Judeth Cornfield,  [facility] Clinical Support [facility] AWV Program Direct Dial ??[phone]

## 2022-10-22 ENCOUNTER — Ambulatory Visit (INDEPENDENT_AMBULATORY_CARE_PROVIDER_SITE_OTHER): Payer: Medicare Other

## 2022-10-22 VITALS — Ht 66.0 in | Wt 229.0 lb

## 2022-10-22 DIAGNOSIS — Z0000 Encounter for general adult medical examination without abnormal findings: Secondary | ICD-10-CM | POA: Diagnosis not present

## 2022-10-22 NOTE — Progress Notes (Signed)
Subjective:   Kevin [name] is a 67 y.o. male who presents for Medicare Annual/Subsequent preventive examination. I connected with  Prescilla [name] on 10/22/22 by a audio enabled telemedicine application and verified that I am speaking with the correct person using two identifiers.  Patient Location: Home  Provider Location: Home Office  I discussed the limitations of evaluation and management by telemedicine. The patient expressed understanding and agreed to proceed.  Review of Systems     Cardiac Risk Factors include: advanced age (>67men, >39 women);hypertension;male gender;dyslipidemia     Objective:    Today's Vitals   10/22/22 1529  Weight: 229 lb (103.9 kg)  Height: 5\' 6"  (1.676 m)   Body mass index is 36.96 kg/m.     10/22/2022    3:32 PM 09/04/2022    3:25 PM 08/05/2022   11:27 AM 04/17/2022    2:07 PM 01/10/2022    2:41 PM 07/04/2021    3:01 PM 06/22/2021   11:04 AM  Advanced Directives  Does Patient Have a Medical Advance Directive? No No No No No No No  Would patient like information on creating a medical advance directive? No - Patient declined No - Patient declined No - Patient declined No - Patient declined No - Patient declined No - Patient declined No - Patient declined    Current Medications (verified) Outpatient Encounter Medications as of 10/22/2022  Medication Sig   amLODipine (NORVASC) 10 MG tablet TAKE 1 TABLET BY MOUTH EVERY DAY   aspirin EC 81 MG tablet Take 81 mg by mouth daily. Swallow whole.   escitalopram (LEXAPRO) 10 MG tablet Take 1 tablet (10 mg total) by mouth daily.   fenofibrate (TRICOR) 145 MG tablet Take 1 tablet (145 mg total) by mouth daily.   fluticasone (FLONASE) 50 MCG/ACT nasal spray Place 2 sprays into both nostrils daily.   LORazepam (ATIVAN) 0.5 MG tablet TAKE 1 TABLET (0.5 MG TOTAL) BY MOUTH EVERY 8 (EIGHT) HOURS. (Patient taking differently: Take 0.5 mg by mouth every 8 (eight) hours as needed for anxiety.)    losartan-hydrochlorothiazide (HYZAAR) 100-25 MG tablet Take 1 tablet by mouth daily.   metoprolol succinate (TOPROL-XL) 25 MG 24 hr tablet Take 1 tablet (25 mg total) by mouth daily.   omega-3 acid ethyl esters (LOVAZA) 1 g capsule Take 2 capsules (2 g total) by mouth 2 (two) times daily.   Facility-Administered Encounter Medications as of 10/22/2022  Medication   0.9 %  sodium chloride infusion   sodium chloride flush (NS) 0.9 % injection 10 mL   sodium chloride flush (NS) 0.9 % injection 10 mL    Allergies (verified) Patient has no known allergies.   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   Hyperlipidemia 02/01/2014   Hypertension    Leg DVT (deep venous thromboembolism), acute, bilateral 04/18/2019   Port-A-Cath in place 04/27/2019   Past Surgical History:  Procedure Laterality Date   COLONOSCOPY WITH PROPOFOL N/A 12/25/2020   Procedure: COLONOSCOPY WITH PROPOFOL;  Surgeon: Corbin Ade, MD;  Location[facility];  Service: Endoscopy;  Laterality: N/A;  2:15pm   CYSTOSCOPY W/ URETERAL STENT PLACEMENT Bilateral 04/21/2019   Procedure: CYSTOSCOPY WITH RETROGRADE PYELOGRAM/URETERAL STENT PLACEMENT;  Surgeon: Malen Gauze, MD;  Location: [facility];  Service: Urology;  Laterality: Bilateral;   ETHMOIDECTOMY Right 02/04/2020   Procedure: ETHMOIDECTOMY;  Surgeon: Newman Pies, MD;  Location: [facility];  Service: ENT;  Laterality: Right;   INGUINAL LYMPH NODE BIOPSY Right 04/21/2019   Procedure: INGUINAL LYMPH NODE BIOPSY;  Surgeon: Lucretia Roers, MD;  Location: [facility];  Service: General;  Laterality: Right;   MAXILLARY ANTROSTOMY Right 02/04/2020   Procedure: MAXILLARY ANTROSTOMY WITH TISSUE REMOVAL;  Surgeon: Newman Pies, MD;  Location: [facility];  Service: ENT;  Laterality: Right;   PORTACATH PLACEMENT Left 05/07/2019   Procedure: INSERTION PORT-A-CATH (catheter left  subclavian);  Surgeon: Lucretia Roers, MD;  Location: [facility];  Service: General;  Laterality: Left;   Family History  Problem Relation Age of Onset   Diabetes Brother    Diabetes Mother    Leukemia Brother    Diabetes Son    Colon cancer Neg Hx    Social History   Socioeconomic History   Marital status: Married    Spouse name: Not on file   Number of children: 3   Years of education: 10   Highest education level: Not on file  Occupational History   Not on file  Tobacco Use   Smoking status: Former    Packs/day: 3.00    Years: 30.00    Additional pack years: 0.00    Total pack years: 90.00    Types: Cigarettes    Quit date: 04/27/1999    Years since quitting: 23.5   Smokeless tobacco: Never  Vaping Use   Vaping Use: Never used  Substance and Sexual Activity   Alcohol use: Yes    Comment: daily. 2-6 beer daily   Drug use: Never   Sexual activity: Yes  Other Topics Concern   Not on file  Social History Narrative   Not on file   Social Determinants of Health   Financial Resource Strain: Low Risk  (10/22/2022)   Overall Financial Resource Strain (CARDIA)    Difficulty of Paying Living Expenses: Not hard at all  Food Insecurity: No Food Insecurity (10/22/2022)   Hunger Vital Sign    Worried About Running Out of Food in the Last Year: Never true    Ran Out of Food in the Last Year: Never true  Transportation Needs: No Transportation Needs (10/22/2022)   PRAPARE - Administrator, Civil Service (Medical): No    Lack of Transportation (Non-Medical): No  Physical Activity: Sufficiently Active (10/22/2022)   Exercise Vital Sign    Days of Exercise per Week: 5 days    Minutes of Exercise per Session: 30 min  Stress: No Stress Concern Present (10/22/2022)   [employer] of Occupational Health - Occupational Stress Questionnaire    Feeling of Stress : Not at all  Social Connections: Moderately Isolated (10/22/2022)   Social Connection and Isolation Panel  [NHANES]    Frequency of Communication with Friends and Family: More than three times a week    Frequency of Social Gatherings with Friends and Family: More than three times a week    Attends Religious Services: Never    Database administrator or Organizations: No    Attends Engineer, structural: Never    Marital Status: Married    Tobacco Counseling Counseling given: Not Answered   Clinical Intake:  Pre-visit preparation completed: Yes  Pain : No/denies pain     Nutritional Risks: None Diabetes: No  How often do you need to have someone help you when you read instructions, pamphlets, or other written materials from your doctor or pharmacy?: 1 - Never  Diabetic?no   Interpreter Needed?: No  Information entered by :: Renie Ora, LPN   Activities of Daily Living    10/22/2022    3:32 PM  In your present state of health, do you have any difficulty performing the following activities:  Hearing? 0  Vision? 0  Difficulty concentrating or making decisions? 0  Walking or climbing stairs? 0  Dressing or bathing? 0  Doing errands, shopping? 0  Preparing Food and eating ? N  Using the Toilet? N  In the past six months, have you accidently leaked urine? N  Do you have problems with loss of bowel control? N  Managing your Medications? N  Managing your Finances? N  Housekeeping or managing your Housekeeping? N    Patient Care Team: Sonny Masters, FNP as PCP - General (Family Medicine) Jena Gauss Gerrit Friends, MD as Consulting Physician (Gastroenterology) Doreatha Massed, MD as Consulting Physician (Hematology)  Indicate any recent Medical Services you may have received from other than [facility] providers in the past year (date may be approximate).     Assessment:   This is a routine wellness examination for [employer].  Hearing/Vision screen Vision Screening - Comments:: Wears rx glasses - up to date with routine eye exams with  Dr.Johnson   Dietary issues and  exercise activities discussed: Current Exercise Habits: Home exercise routine, Type of exercise: walking, Time (Minutes): 30, Frequency (Times/Week): 3, Weekly Exercise (Minutes/Week): 90, Intensity: Mild, Exercise limited by: None identified   Goals Addressed             This Visit's Progress    DIET - INCREASE WATER INTAKE   On track      Depression Screen    08/29/2022    3:31 PM 04/30/2022    9:41 AM 02/26/2022    3:52 PM 01/11/2022    2:22 PM 07/17/2021    3:43 PM 11/30/2020    4:29 PM 11/30/2020    4:24 PM  PHQ 2/9 Scores  PHQ - 2 Score 0 0 0 0 0 0 0  PHQ- 9 Score 0 0 0 0 0      Fall Risk    10/22/2022    3:30 PM 08/29/2022    3:31 PM 04/30/2022    9:42 AM 02/26/2022    3:52 PM 01/11/2022    2:21 PM  Fall Risk   Falls in the past year? 0 0 0 0 0  Number falls in past yr: 0      Injury with Fall? 0      Risk for fall due to : No Fall Risks      Follow up Falls prevention discussed        FALL RISK PREVENTION PERTAINING TO THE HOME:  Any stairs in or around the home? No  If so, are there any without handrails? No  Home free of loose throw rugs in walkways, pet beds, electrical cords, etc? Yes  Adequate lighting in your home to reduce risk of falls? Yes   ASSISTIVE DEVICES UTILIZED TO PREVENT FALLS:  Life alert? No  Use of a cane, walker or w/c? No  Grab bars in the bathroom? No  Shower chair or bench in shower? No  Elevated toilet seat or a handicapped toilet? No        10/22/2022    3:32 PM 11/30/2020    4:31 PM  6CIT Screen  What Year? 0 points 0 points  What month? 0 points 0 points  What time? 0 points  0 points  Count back from 20 0 points 0 points  Months in reverse 0 points 4 points  Repeat phrase 0 points 4 points  Total Score 0 points 8 points    Immunizations Immunization History  Administered Date(s) Administered   Fluad Quad(high Dose 65+) 06/08/2020, 07/17/2021   Influenza,inj,Quad PF,6+ Mos 06/04/2016, 08/27/2017, 04/18/2019    Influenza-Unspecified 07/24/2015, 06/04/2016, 08/27/2017   Pneumococcal Conjugate-13 12/09/2019   Pneumococcal Polysaccharide-23 02/07/2015, 01/11/2021   Tdap 05/23/2010    TDAP status: Due, Education has been provided regarding the importance of this vaccine. Advised may receive this vaccine at local pharmacy or Health Dept. Aware to provide a copy of the vaccination record if obtained from local pharmacy or Health Dept. Verbalized acceptance and understanding.  Flu Vaccine status: Up to date  Pneumococcal vaccine status: Up to date  Covid-19 vaccine status: Declined, Education has been provided regarding the importance of this vaccine but patient still declined. Advised may receive this vaccine at local pharmacy or Health Dept.or vaccine clinic. Aware to provide a copy of the vaccination record if obtained from local pharmacy or Health Dept. Verbalized acceptance and understanding.  Qualifies for Shingles Vaccine? Yes   Zostavax completed No   Shingrix Completed?: No.    Education has been provided regarding the importance of this vaccine. Patient has been advised to call insurance company to determine out of pocket expense if they have not yet received this vaccine. Advised may also receive vaccine at local pharmacy or Health Dept. Verbalized acceptance and understanding.  Screening Tests Health Maintenance  Topic Date Due   COVID-19 Vaccine (1) Never done   DTaP/Tdap/Td (2 - Td or Tdap) 05/23/2020   Zoster Vaccines- Shingrix (1 of 2) 11/27/2022 (Originally 09/27/1974)   INFLUENZA VACCINE  01/30/2023   Medicare Annual Wellness (AWV)  10/22/2023   COLONOSCOPY (Pts 45-23yrs Insurance coverage will need to be confirmed)  12/26/2030   Pneumonia Vaccine 17+ Years old  Completed   Hepatitis C Screening  Completed   HPV VACCINES  Aged Out    Health Maintenance  Health Maintenance Due  Topic Date Due   COVID-19 Vaccine (1) Never done   DTaP/Tdap/Td (2 - Td or Tdap) 05/23/2020     Colorectal cancer screening: Type of screening: Colonoscopy. Completed 12/25/2020. Repeat every 10 years  Lung Cancer Screening: (Low Dose CT Chest recommended if Age 8-80 years, 30 pack-year currently smoking OR have quit w/in 15years.) does not qualify.   Lung Cancer Screening Referral: n/a  Additional Screening:  Hepatitis C Screening: does not qualify; Completed 02/03/2017  Vision Screening: Recommended annual ophthalmology exams for early detection of glaucoma and other disorders of the eye. Is the patient up to date with their annual eye exam?  Yes  Who is the provider or what is the name of the office in which the patient attends annual eye exams? Dr.Johnson  If pt is not established with a provider, would they like to be referred to a provider to establish care? No .   Dental Screening: Recommended annual dental exams for proper oral hygiene  Community Resource Referral / Chronic Care Management: CRR required this visit?  No   CCM required this visit?  No      Plan:     I have personally reviewed and noted the following in the patient's chart:   Medical and social history Use of alcohol, tobacco or illicit drugs  Current medications and supplements including opioid prescriptions. Patient is not currently taking opioid prescriptions. Functional  ability and status Nutritional status Physical activity Advanced directives List of other physicians Hospitalizations, surgeries, and ER visits in previous 12 months Vitals Screenings to include cognitive, depression, and falls Referrals and appointments  In addition, I have reviewed and discussed with patient certain preventive protocols, quality metrics, and best practice recommendations. A written personalized care plan for preventive services as well as general preventive health recommendations were provided to patient.     Lorrene Reid, LPN   [id number]/82/[id number]   Nurse Notes: Due Tdap Vaccine

## 2022-10-22 NOTE — Patient Instructions (Signed)
[name] , Thank you for taking time to come for your Medicare Wellness Visit. I appreciate your ongoing commitment to your health goals. Please review the following plan we discussed and let me know if I can assist you in the future.   These are the goals we discussed:  Goals      Cut out extra servings     DIET - INCREASE WATER INTAKE        This is a list of the screening recommended for you and due dates:  Health Maintenance  Topic Date Due   COVID-19 Vaccine (1) Never done   DTaP/Tdap/Td vaccine (2 - Td or Tdap) 05/23/2020   Zoster (Shingles) Vaccine (1 of 2) 11/27/2022*   Flu Shot  01/30/2023   Medicare Annual Wellness Visit  10/22/2023   Colon Cancer Screening  12/26/2030   Pneumonia Vaccine  Completed   Hepatitis C Screening: USPSTF Recommendation to screen - Ages 18-79 yo.  Completed   HPV Vaccine  Aged Out  *Topic was postponed. The date shown is not the original due date.    Advanced directives: Advance directive discussed with you today. I have provided a copy for you to complete at home and have notarized. Once this is complete please bring a copy in to our office so we can scan it into your chart.   Conditions/risks identified: Aim for 30 minutes of exercise or brisk walking, 6-8 glasses of water, and 5 servings of fruits and vegetables each day.   Next appointment: Follow up in one year for your annual wellness visit.   Preventive Care 54 Years and Older, Male  Preventive care refers to lifestyle choices and visits with your health care provider that can promote health and wellness. What does preventive care include? A yearly physical exam. This is also called an annual well check. Dental exams once or twice a year. Routine eye exams. Ask your health care provider how often you should have your eyes checked. Personal lifestyle choices, including: Daily care of your teeth and gums. Regular physical activity. Eating a healthy diet. Avoiding tobacco and drug  use. Limiting alcohol use. Practicing safe sex. Taking low doses of aspirin every day. Taking vitamin and mineral supplements as recommended by your health care provider. What happens during an annual well check? The services and screenings done by your health care provider during your annual well check will depend on your age, overall health, lifestyle risk factors, and family history of disease. Counseling  Your health care provider may ask you questions about your: Alcohol use. Tobacco use. Drug use. Emotional well-being. Home and relationship well-being. Sexual activity. Eating habits. History of falls. Memory and ability to understand (cognition). Work and work Astronomer. Screening  You may have the following tests or measurements: Height, weight, and BMI. Blood pressure. Lipid and cholesterol levels. These may be checked every 5 years, or more frequently if you are over 66 years old. Skin check. Lung cancer screening. You may have this screening every year starting at age 63 if you have a 30-pack-year history of smoking and currently smoke or have quit within the past 15 years. Fecal occult blood test (FOBT) of the stool. You may have this test every year starting at age 68. Flexible sigmoidoscopy or colonoscopy. You may have a sigmoidoscopy every 5 years or a colonoscopy every 10 years starting at age 68. Prostate cancer screening. Recommendations will vary depending on your family history and other risks. Hepatitis C blood test. Hepatitis B  blood test. Sexually transmitted disease (STD) testing. Diabetes screening. This is done by checking your blood sugar (glucose) after you have not eaten for a while (fasting). You may have this done every 1-3 years. Abdominal aortic aneurysm (AAA) screening. You may need this if you are a current or former smoker. Osteoporosis. You may be screened starting at age 1 if you are at high risk. Talk with your health care provider about  your test results, treatment options, and if necessary, the need for more tests. Vaccines  Your health care provider may recommend certain vaccines, such as: Influenza vaccine. This is recommended every year. Tetanus, diphtheria, and acellular pertussis (Tdap, Td) vaccine. You may need a Td booster every 10 years. Zoster vaccine. You may need this after age 67. Pneumococcal 13-valent conjugate (PCV13) vaccine. One dose is recommended after age 41. Pneumococcal polysaccharide (PPSV23) vaccine. One dose is recommended after age 12. Talk to your health care provider about which screenings and vaccines you need and how often you need them. This information is not intended to replace advice given to you by your health care provider. Make sure you discuss any questions you have with your health care provider. Document Released: 07/14/2015 Document Revised: 03/06/2016 Document Reviewed: 04/18/2015 Elsevier Interactive Patient Education  2017 [place] Prevention in the Home Falls can cause injuries. They can happen to people of all ages. There are many things you can do to make your home safe and to help prevent falls. What can I do on the outside of my home? Regularly fix the edges of walkways and driveways and fix any cracks. Remove anything that might make you trip as you walk through a door, such as a raised step or threshold. Trim any bushes or trees on the path to your home. Use bright outdoor lighting. Clear any walking paths of anything that might make someone trip, such as rocks or tools. Regularly check to see if handrails are loose or broken. Make sure that both sides of any steps have handrails. Any raised decks and porches should have guardrails on the edges. Have any leaves, snow, or ice cleared regularly. Use sand or salt on walking paths during winter. Clean up any spills in your garage right away. This includes oil or grease spills. What can I do in the bathroom? Use  night lights. Install grab bars by the toilet and in the tub and shower. Do not use towel bars as grab bars. Use non-skid mats or decals in the tub or shower. If you need to sit down in the shower, use a plastic, non-slip stool. Keep the floor dry. Clean up any water that spills on the floor as soon as it happens. Remove soap buildup in the tub or shower regularly. Attach bath mats securely with double-sided non-slip rug tape. Do not have throw rugs and other things on the floor that can make you trip. What can I do in the bedroom? Use night lights. Make sure that you have a light by your bed that is easy to reach. Do not use any sheets or blankets that are too big for your bed. They should not hang down onto the floor. Have a firm chair that has side arms. You can use this for support while you get dressed. Do not have throw rugs and other things on the floor that can make you trip. What can I do in the kitchen? Clean up any spills right away. Avoid walking on wet floors. Keep items  that you use a lot in easy-to-reach places. If you need to reach something above you, use a strong step stool that has a grab bar. Keep electrical cords out of the way. Do not use floor polish or wax that makes floors slippery. If you must use wax, use non-skid floor wax. Do not have throw rugs and other things on the floor that can make you trip. What can I do with my stairs? Do not leave any items on the stairs. Make sure that there are handrails on both sides of the stairs and use them. Fix handrails that are broken or loose. Make sure that handrails are as long as the stairways. Check any carpeting to make sure that it is firmly attached to the stairs. Fix any carpet that is loose or worn. Avoid having throw rugs at the top or bottom of the stairs. If you do have throw rugs, attach them to the floor with carpet tape. Make sure that you have a light switch at the top of the stairs and the bottom of the  stairs. If you do not have them, ask someone to add them for you. What else can I do to help prevent falls? Wear shoes that: Do not have high heels. Have rubber bottoms. Are comfortable and fit you well. Are closed at the toe. Do not wear sandals. If you use a stepladder: Make sure that it is fully opened. Do not climb a closed stepladder. Make sure that both sides of the stepladder are locked into place. Ask someone to hold it for you, if possible. Clearly mark and make sure that you can see: Any grab bars or handrails. First and last steps. Where the edge of each step is. Use tools that help you move around (mobility aids) if they are needed. These include: Canes. Walkers. Scooters. Crutches. Turn on the lights when you go into a dark area. Replace any light bulbs as soon as they burn out. Set up your furniture so you have a clear path. Avoid moving your furniture around. If any of your floors are uneven, fix them. If there are any pets around you, be aware of where they are. Review your medicines with your doctor. Some medicines can make you feel dizzy. This can increase your chance of falling. Ask your doctor what other things that you can do to help prevent falls. This information is not intended to replace advice given to you by your health care provider. Make sure you discuss any questions you have with your health care provider. Document Released: 04/13/2009 Document Revised: 11/23/2015 Document Reviewed: 07/22/2014 Elsevier Interactive Patient Education  2017 [employer].

## 2022-11-21 ENCOUNTER — Ambulatory Visit (HOSPITAL_COMMUNITY): Admission: RE | Admit: 2022-11-21 | Payer: Medicare Other | Source: Ambulatory Visit

## 2022-11-28 ENCOUNTER — Ambulatory Visit: Payer: Medicare Other | Admitting: Family Medicine

## 2022-11-28 ENCOUNTER — Encounter: Payer: Self-pay | Admitting: Family Medicine

## 2022-12-06 ENCOUNTER — Inpatient Hospital Stay: Payer: Medicare Other | Attending: Hematology

## 2022-12-25 ENCOUNTER — Other Ambulatory Visit: Payer: Self-pay | Admitting: Family Medicine

## 2022-12-25 DIAGNOSIS — I10 Essential (primary) hypertension: Secondary | ICD-10-CM

## 2022-12-31 ENCOUNTER — Inpatient Hospital Stay: Payer: Medicare Other | Attending: Hematology

## 2022-12-31 DIAGNOSIS — Z95828 Presence of other vascular implants and grafts: Secondary | ICD-10-CM

## 2022-12-31 DIAGNOSIS — C8338 Diffuse large B-cell lymphoma, lymph nodes of multiple sites: Secondary | ICD-10-CM | POA: Diagnosis not present

## 2022-12-31 LAB — COMPREHENSIVE METABOLIC PANEL
ALT: 22 U/L (ref 0–44)
AST: 29 U/L (ref 15–41)
Albumin: 4 g/dL (ref 3.5–5.0)
Alkaline Phosphatase: 38 U/L (ref 38–126)
Anion gap: 8 (ref 5–15)
BUN: 17 mg/dL (ref 8–23)
CO2: 28 mmol/L (ref 22–32)
Calcium: 9.1 mg/dL (ref 8.9–10.3)
Chloride: 101 mmol/L (ref 98–111)
Creatinine, Ser: 0.93 mg/dL (ref 0.61–1.24)
GFR, Estimated: >60 mL/min (ref >60)
Glucose, Bld: 103 mg/dL — ABNORMAL HIGH (ref 70–99)
Potassium: 4.2 mmol/L (ref 3.5–5.1)
Sodium: 137 mmol/L (ref 135–145)
Total Bilirubin: 0.7 mg/dL (ref 0.3–1.2)
Total Protein: 6.7 g/dL (ref 6.5–8.1)

## 2022-12-31 LAB — CBC
HCT: 38.1 % — ABNORMAL LOW (ref 39.0–52.0)
Hemoglobin: 12.9 g/dL — ABNORMAL LOW (ref 13.0–17.0)
MCH: 31.9 pg (ref 26.0–34.0)
MCHC: 33.9 g/dL (ref 30.0–36.0)
MCV: 94.1 fL (ref 80.0–100.0)
Platelets: 202 10*3/uL (ref 150–400)
RBC: 4.05 MIL/uL — ABNORMAL LOW (ref 4.22–5.81)
RDW: 11.9 % (ref 11.5–15.5)
WBC: 6.9 10*3/uL (ref 4.0–10.5)
nRBC: 0 % (ref 0.0–0.2)

## 2022-12-31 LAB — LACTATE DEHYDROGENASE: LDH: 129 U/L (ref 98–192)

## 2022-12-31 MED ORDER — SODIUM CHLORIDE 0.9% FLUSH
10.0000 mL | Freq: Once | INTRAVENOUS | Status: AC
  Administered 2022-12-31: 10 mL via INTRAVENOUS

## 2022-12-31 MED ORDER — HEPARIN SOD (PORK) LOCK FLUSH 100 UNIT/ML IV SOLN
500.0000 [IU] | Freq: Once | INTRAVENOUS | Status: AC
  Administered 2022-12-31: 500 [IU] via INTRAVENOUS

## 2022-12-31 NOTE — Progress Notes (Signed)
Patients port flushed without difficulty.  No blood return noted with no bruising or swelling noted at site.  Denied pain with flush.  Stated he could taste the saline flush. Band aid applied.  Labs drawn peripherally.  VSS with discharge and left in satisfactory condition with no s/s of distress noted.

## 2023-01-09 ENCOUNTER — Ambulatory Visit (INDEPENDENT_AMBULATORY_CARE_PROVIDER_SITE_OTHER): Payer: Medicare Other | Admitting: Family Medicine

## 2023-01-09 ENCOUNTER — Encounter: Payer: Self-pay | Admitting: Family Medicine

## 2023-01-09 VITALS — BP 137/64 | HR 56 | Temp 97.5°F | Ht 66.0 in | Wt 223.4 lb

## 2023-01-09 DIAGNOSIS — I10 Essential (primary) hypertension: Secondary | ICD-10-CM | POA: Diagnosis not present

## 2023-01-09 DIAGNOSIS — E781 Pure hyperglyceridemia: Secondary | ICD-10-CM | POA: Diagnosis not present

## 2023-01-09 DIAGNOSIS — F419 Anxiety disorder, unspecified: Secondary | ICD-10-CM

## 2023-01-09 DIAGNOSIS — E559 Vitamin D deficiency, unspecified: Secondary | ICD-10-CM

## 2023-01-09 MED ORDER — OMEGA-3-ACID ETHYL ESTERS 1 G PO CAPS
2.0000 | ORAL_CAPSULE | Freq: Two times a day (BID) | ORAL | 1 refills | Status: DC
Start: 2023-01-09 — End: 2023-01-09

## 2023-01-09 MED ORDER — AMLODIPINE BESYLATE 10 MG PO TABS: 10.0000 mg | ORAL_TABLET | Freq: Every day | ORAL | 1 refills | Status: DC

## 2023-01-09 MED ORDER — ESCITALOPRAM OXALATE 10 MG PO TABS: 10.0000 mg | ORAL_TABLET | Freq: Every day | ORAL | 1 refills | Status: DC

## 2023-01-09 MED ORDER — LOSARTAN POTASSIUM-HCTZ 100-25 MG PO TABS: 1.0000 | ORAL_TABLET | Freq: Every day | ORAL | 1 refills | Status: DC

## 2023-01-09 NOTE — Progress Notes (Signed)
Subjective:  Patient ID: Kevin [name], male    DOB: 01-16-1956, 67 y.o.   MRN: [id number]  Patient Care Team: Sonny Masters, FNP as PCP - General (Family Medicine) Jena Gauss Gerrit Friends, MD as Consulting Physician (Gastroenterology) Doreatha Massed, MD as Consulting Physician (Hematology)   Chief Complaint:  Medical Management of Chronic Issues   HPI: Kevin [name] is a 67 y.o. male presenting on 01/09/2023 for Medical Management of Chronic Issues    1. Essential hypertension Complaint with meds - Yes Current Medications - amlodipine, Hyzaar  Checking BP at home - no Exercising Regularly - No Watching Salt intake - Yes Pertinent ROS:  Headache - No Fatigue - No Visual Disturbances - No Chest pain - No Dyspnea - No Palpitations - No LE edema - No They report good compliance with medications and can restate their regimen by memory. No medication side effects.  BP Readings from Last 3 Encounters:  01/09/23 137/64  09/04/22 (!) 152/52  08/29/22 (!) 123/49     2. Anxiety States he feels he is doing very well on Lexapro. Denies any significant anxiety.     01/09/2023   11:40 AM 08/29/2022    3:31 PM 04/30/2022    9:41 AM 02/26/2022    3:52 PM 01/11/2022    2:22 PM  Depression screen PHQ 2/9  Decreased Interest 0 0 0 0 0  Down, Depressed, Hopeless 0 0 0 0 0  PHQ - 2 Score 0 0 0 0 0  Altered sleeping 0 0 0 0 0  Tired, decreased energy 0 0 0 0 0  Change in appetite 0 0 0 0 0  Feeling bad or failure about yourself  0 0 0 0 0  Trouble concentrating 0 0 0 0 0  Moving slowly or fidgety/restless 0 0 0 0 0  Suicidal thoughts 0 0 0 0 0  PHQ-9 Score 0 0 0 0 0  Difficult doing work/chores Not difficult at all Not difficult at all Not difficult at all Not difficult at all Not difficult at all      01/09/2023   11:40 AM 08/29/2022    3:32 PM 04/30/2022    9:41 AM 02/26/2022    3:52 PM  GAD 7 : Generalized Anxiety Score  Nervous, Anxious, on Edge 0 0 0 0   Control/stop worrying 0 0 0 0  Worry too much - different things 0 0 0 0  Trouble relaxing 0 0 0 0  Restless 0 0 0 0  Easily annoyed or irritable 0 0 0 0  Afraid - awful might happen 0 0 0 0  Total GAD 7 Score 0 0 0 0  Anxiety Difficulty Not difficult at all Not difficult at all Not difficult at all Not difficult at all    3. Hypertriglyceridemia Has been taking fenofibrate and is tolerating well. Has not been taking the Omega 3 and does not wist to restart.   4. Vitamin D deficiency Pt is not taking oral repletion therapy. Denies bone pain and tenderness, muscle weakness, fracture, and difficulty walking. No results found for: "VD25OH" Lab Results  Component Value Date   CALCIUM 9.1 12/31/2022   PHOS 2.7 07/31/2020        Relevant past medical, surgical, family, and social history reviewed and updated as indicated.  Allergies and medications reviewed and updated. Data reviewed: Chart in Epic.   Past Medical History:  Diagnosis Date   Anxiety    Depression  Diffuse large B cell lymphoma (HCC) 04/26/2019   Hyperlipidemia 02/01/2014   Hypertension    Leg DVT (deep venous thromboembolism), acute, bilateral (HCC) 04/18/2019   Port-A-Cath in place 04/27/2019    Past Surgical History:  Procedure Laterality Date   COLONOSCOPY WITH PROPOFOL N/A 12/25/2020   Procedure: COLONOSCOPY WITH PROPOFOL;  Surgeon: Corbin Ade, MD;  Location: [facility];  Service: Endoscopy;  Laterality: N/A;  2:15pm   CYSTOSCOPY W/ URETERAL STENT PLACEMENT Bilateral 04/21/2019   Procedure: CYSTOSCOPY WITH RETROGRADE PYELOGRAM/URETERAL STENT PLACEMENT;  Surgeon: Malen Gauze, MD;  Location: [facility];  Service: Urology;  Laterality: Bilateral;   ETHMOIDECTOMY Right 02/04/2020   Procedure: ETHMOIDECTOMY;  Surgeon: Newman Pies, MD;  Location: [facility];  Service: ENT;  Laterality: Right;   INGUINAL LYMPH NODE BIOPSY Right 04/21/2019   Procedure: INGUINAL LYMPH NODE BIOPSY;  Surgeon:  Lucretia Roers, MD;  Location: [facility];  Service: General;  Laterality: Right;   MAXILLARY ANTROSTOMY Right 02/04/2020   Procedure: MAXILLARY ANTROSTOMY WITH TISSUE REMOVAL;  Surgeon: Newman Pies, MD;  Location: [facility];  Service: ENT;  Laterality: Right;   PORTACATH PLACEMENT Left 05/07/2019   Procedure: INSERTION PORT-A-CATH (catheter left subclavian);  Surgeon: Lucretia Roers, MD;  Location: [facility];  Service: General;  Laterality: Left;    Social History   Socioeconomic History   Marital status: Married    Spouse name: Not on file   Number of children: 3   Years of education: 10   Highest education level: Not on file  Occupational History   Not on file  Tobacco Use   Smoking status: Former    Current packs/day: 0.00    Average packs/day: 3.0 packs/day for 30.0 years (90.0 ttl pk-yrs)    Types: Cigarettes    Start date: 04/26/1969    Quit date: 04/27/1999    Years since quitting: 23.7   Smokeless tobacco: Never  Vaping Use   Vaping status: Never Used  Substance and Sexual Activity   Alcohol use: Yes    Comment: daily. 2-6 beer daily   Drug use: Never   Sexual activity: Yes  Other Topics Concern   Not on file  Social History Narrative   Not on file   Social Determinants of Health   Financial Resource Strain: Low Risk  (10/22/2022)   Overall Financial Resource Strain (CARDIA)    Difficulty of Paying Living Expenses: Not hard at all  Food Insecurity: No Food Insecurity (10/22/2022)   Hunger Vital Sign    Worried About Running Out of Food in the Last Year: Never true    Ran Out of Food in the Last Year: Never true  Transportation Needs: No Transportation Needs (10/22/2022)   PRAPARE - Administrator, Civil Service (Medical): No    Lack of Transportation (Non-Medical): No  Physical Activity: Sufficiently Active (10/22/2022)   Exercise Vital Sign    Days of Exercise per Week: 5 days    Minutes of Exercise per Session: 30 min  Stress: No  Stress Concern Present (10/22/2022)   [employer] of Occupational Health - Occupational Stress Questionnaire    Feeling of Stress : Not at all  Social Connections: Moderately Isolated (10/22/2022)   Social Connection and Isolation Panel [NHANES]    Frequency of Communication with Friends and Family: More than three times a week    Frequency of Social Gatherings with Friends and Family: More than three times a week  Attends Religious Services: Never    Active Member of Clubs or Organizations: No    Attends Banker Meetings: Never    Marital Status: Married  Catering manager Violence: Not At Risk (10/22/2022)   Humiliation, Afraid, Rape, and Kick questionnaire    Fear of Current or Ex-Partner: No    Emotionally Abused: No    Physically Abused: No    Sexually Abused: No    Outpatient Encounter Medications as of 01/09/2023  Medication Sig   aspirin EC 81 MG tablet Take 81 mg by mouth daily. Swallow whole.   fenofibrate (TRICOR) 145 MG tablet Take 1 tablet (145 mg total) by mouth daily.   fluticasone (FLONASE) 50 MCG/ACT nasal spray Place 2 sprays into both nostrils daily.   LORazepam (ATIVAN) 0.5 MG tablet TAKE 1 TABLET (0.5 MG TOTAL) BY MOUTH EVERY 8 (EIGHT) HOURS. (Patient taking differently: Take 0.5 mg by mouth every 8 (eight) hours as needed for anxiety.)   metoprolol succinate (TOPROL-XL) 25 MG 24 hr tablet Take 1 tablet (25 mg total) by mouth daily.   [DISCONTINUED] amLODipine (NORVASC) 10 MG tablet TAKE 1 TABLET BY MOUTH EVERY DAY   [DISCONTINUED] escitalopram (LEXAPRO) 10 MG tablet Take 1 tablet (10 mg total) by mouth daily.   [DISCONTINUED] losartan-hydrochlorothiazide (HYZAAR) 100-25 MG tablet Take 1 tablet by mouth daily.   [DISCONTINUED] omega-3 acid ethyl esters (LOVAZA) 1 g capsule Take 2 capsules (2 g total) by mouth 2 (two) times daily.   amLODipine (NORVASC) 10 MG tablet Take 1 tablet (10 mg total) by mouth daily.   escitalopram (LEXAPRO) 10 MG tablet  Take 1 tablet (10 mg total) by mouth daily.   losartan-hydrochlorothiazide (HYZAAR) 100-25 MG tablet Take 1 tablet by mouth daily.   [DISCONTINUED] omega-3 acid ethyl esters (LOVAZA) 1 g capsule Take 2 capsules (2 g total) by mouth 2 (two) times daily.   Facility-Administered Encounter Medications as of 01/09/2023  Medication   0.9 %  sodium chloride infusion   sodium chloride flush (NS) 0.9 % injection 10 mL   sodium chloride flush (NS) 0.9 % injection 10 mL    No Known Allergies  Review of Systems  Constitutional:  Negative for activity change, appetite change, chills, fatigue and fever.  HENT: Negative.    Eyes: Negative.  Negative for photophobia and visual disturbance.  Respiratory:  Negative for cough, chest tightness and shortness of breath.   Cardiovascular:  Negative for chest pain, palpitations and leg swelling.  Gastrointestinal:  Negative for abdominal pain, blood in stool, constipation, diarrhea, nausea and vomiting.  Endocrine: Negative.  Negative for polydipsia, polyphagia and polyuria.  Genitourinary:  Negative for decreased urine volume, difficulty urinating, dysuria, frequency and urgency.  Musculoskeletal:  Negative for arthralgias and myalgias.  Skin: Negative.   Allergic/Immunologic: Negative.   Neurological:  Negative for dizziness, tremors, seizures, syncope, facial asymmetry, speech difficulty, weakness, light-headedness, numbness and headaches.  Hematological: Negative.   Psychiatric/Behavioral:  Negative for confusion, hallucinations, sleep disturbance and suicidal ideas.   All other systems reviewed and are negative.       Objective:  BP 137/64   Pulse (!) 56   Temp (!) 97.5 F (36.4 C) (Temporal)   Ht 5\' 6"  (1.676 m)   Wt 223 lb 6.4 oz (101.3 kg)   SpO2 91%   BMI 36.06 kg/m    Wt Readings from Last 3 Encounters:  01/09/23 223 lb 6.4 oz (101.3 kg)  10/22/22 229 lb (103.9 kg)  09/04/22 223 lb (101.2 kg)  Physical Exam Vitals and nursing  note reviewed.  Constitutional:      General: He is not in acute distress.    Appearance: Normal appearance. He is well-developed and well-groomed. He is obese. He is not ill-appearing, toxic-appearing or diaphoretic.  HENT:     Head: Normocephalic and atraumatic.     Jaw: There is normal jaw occlusion.     Right Ear: Hearing normal.     Left Ear: Hearing normal.     Nose: Nose normal.     Mouth/Throat:     Lips: Pink.     Mouth: Mucous membranes are moist.     Pharynx: Oropharynx is clear. Uvula midline.  Eyes:     General: Lids are normal.     Conjunctiva/sclera: Conjunctivae normal.     Pupils: Pupils are equal, round, and reactive to light.  Neck:     Thyroid: No thyroid mass, thyromegaly or thyroid tenderness.     Vascular: No carotid bruit or JVD.     Trachea: Trachea and phonation normal.  Cardiovascular:     Rate and Rhythm: Normal rate and regular rhythm.     Chest Wall: PMI is not displaced.     Pulses: Normal pulses.     Heart sounds: Murmur heard.     Systolic murmur is present with a grade of 2/6.     No friction rub. No gallop.  Pulmonary:     Effort: Pulmonary effort is normal. No respiratory distress.     Breath sounds: Normal breath sounds. No wheezing.  Chest:    Abdominal:     General: Bowel sounds are normal.     Palpations: Abdomen is soft.  Musculoskeletal:        General: Normal range of motion.     Cervical back: Normal range of motion and neck supple.     Right lower leg: No edema.     Left lower leg: No edema.  Lymphadenopathy:     Cervical: No cervical adenopathy.  Skin:    General: Skin is warm and dry.     Capillary Refill: Capillary refill takes less than 2 seconds.     Coloration: Skin is not cyanotic, jaundiced or pale.     Findings: No rash.  Neurological:     General: No focal deficit present.     Mental Status: He is alert and oriented to person, place, and time.     Sensory: Sensation is intact.     Motor: Motor function is  intact.     Coordination: Coordination is intact.     Gait: Gait is intact.     Deep Tendon Reflexes: Reflexes are normal and symmetric.  Psychiatric:        Attention and Perception: Attention and perception normal.        Mood and Affect: Mood and affect normal.        Speech: Speech normal.        Behavior: Behavior normal. Behavior is cooperative.        Thought Content: Thought content normal.        Cognition and Memory: Cognition and memory normal.        Judgment: Judgment normal.     Results for orders placed or performed in visit on 12/31/22  Comprehensive metabolic panel  Result Value Ref Range   Sodium 137 135 - 145 mmol/L   Potassium 4.2 3.5 - 5.1 mmol/L   Chloride 101 98 - 111 mmol/L   CO2 28 22 -  32 mmol/L   Glucose, Bld 103 (H) 70 - 99 mg/dL   BUN 17 8 - 23 mg/dL   Creatinine, Ser 1.61 0.61 - 1.24 mg/dL   Calcium 9.1 8.9 - 09.6 mg/dL   Total Protein 6.7 6.5 - 8.1 g/dL   Albumin 4.0 3.5 - 5.0 g/dL   AST 29 15 - 41 U/L   ALT 22 0 - 44 U/L   Alkaline Phosphatase 38 38 - 126 U/L   Total Bilirubin 0.7 0.3 - 1.2 mg/dL   GFR, Estimated >04 >54 mL/min   Anion gap 8 5 - 15  Lactate dehydrogenase  Result Value Ref Range   LDH 129 98 - 192 U/L  CBC  Result Value Ref Range   WBC 6.9 4.0 - 10.5 K/uL   RBC 4.05 (L) 4.22 - 5.81 MIL/uL   Hemoglobin 12.9 (L) 13.0 - 17.0 g/dL   HCT 09.8 (L) 11.9 - 14.7 %   MCV 94.1 80.0 - 100.0 fL   MCH 31.9 26.0 - 34.0 pg   MCHC 33.9 30.0 - 36.0 g/dL   RDW 82.9 56.2 - 13.0 %   Platelets 202 150 - 400 K/uL   nRBC 0.0 0.0 - 0.2 %       Pertinent labs & imaging results that were available during my care of the patient were reviewed by me and considered in my medical decision making.  Assessment & Plan:  Corde Antonini" was seen today for medical management of chronic issues.  Diagnoses and all orders for this visit:  Essential hypertension BP well controlled. Changes were not made in regimen today. Goal BP is 130/80. Pt aware to  report any persistent high or low readings. DASH diet and exercise encouraged. Exercise at least 150 minutes per week and increase as tolerated. Goal BMI > 25. Stress management encouraged. Avoid nicotine and tobacco product use. Avoid excessive alcohol and NSAID's. Avoid more than 2000 mg of sodium daily. Medications as prescribed. Follow up as scheduled.  -     amLODipine (NORVASC) 10 MG tablet; Take 1 tablet (10 mg total) by mouth daily. -     losartan-hydrochlorothiazide (HYZAAR) 100-25 MG tablet; Take 1 tablet by mouth daily. -     CBC with Differential/Platelet -     CMP14+EGFR -     Lipid panel -     Thyroid Panel With TSH  Anxiety Well controlled on below, will continue.  -     escitalopram (LEXAPRO) 10 MG tablet; Take 1 tablet (10 mg total) by mouth daily. -     Thyroid Panel With TSH  Hypertriglyceridemia Labs pending, continue fenofibrate.  -     CMP14+EGFR -     Lipid panel  Vitamin D deficiency Labs pending. Continue repletion therapy. If indicated, will change repletion dosage. Eat foods rich in Vit D including milk, orange juice, yogurt with vitamin D added, salmon or mackerel, canned tuna fish, cereals with vitamin D added, and cod liver oil. Get out in the sun but make sure to wear at least SPF 30 sunscreen.  -     VITAMIN D 25 Hydroxy (Vit-D Deficiency, Fractures)     Continue all other maintenance medications.  Follow up plan: Return in about 6 months (around 07/12/2023), or if symptoms worsen or fail to improve, for CPE.   Continue healthy lifestyle choices, including diet (rich in fruits, vegetables, and lean proteins, and low in salt and simple carbohydrates) and exercise (at least 30 minutes of moderate physical activity daily).  Educational handout given for DASH diet and exercise  The above assessment and management plan was discussed with the patient. The patient verbalized understanding of and has agreed to the management plan. Patient is aware to call the  clinic if they develop any new symptoms or if symptoms persist or worsen. Patient is aware when to return to the clinic for a follow-up visit. Patient educated on when it is appropriate to go to the emergency department.   Kari Baars, FNP-C [facility] [phone]

## 2023-01-09 NOTE — Patient Instructions (Signed)

## 2023-01-10 LAB — CMP14+EGFR
ALT: 18 IU/L (ref 0–44)
AST: 21 IU/L (ref 0–40)
Albumin: 4.3 g/dL (ref 3.9–4.9)
Alkaline Phosphatase: 53 IU/L (ref 44–121)
BUN/Creatinine Ratio: 23 (ref 10–24)
BUN: 23 mg/dL (ref 8–27)
Bilirubin Total: 0.3 mg/dL (ref 0.0–1.2)
CO2: 24 mmol/L (ref 20–29)
Calcium: 9.6 mg/dL (ref 8.6–10.2)
Chloride: 100 mmol/L (ref 96–106)
Creatinine, Ser: 1.02 mg/dL (ref 0.76–1.27)
Globulin, Total: 2.1 g/dL (ref 1.5–4.5)
Glucose: 108 mg/dL — ABNORMAL HIGH (ref 70–99)
Potassium: 4.6 mmol/L (ref 3.5–5.2)
Sodium: 139 mmol/L (ref 134–144)
Total Protein: 6.4 g/dL (ref 6.0–8.5)
eGFR: 80 mL/min/{1.73_m2} (ref >59)

## 2023-01-10 LAB — CBC WITH DIFFERENTIAL/PLATELET
Basophils Absolute: 0 10*3/uL (ref 0.0–0.2)
Basos: 1 %
EOS (ABSOLUTE): 0.3 10*3/uL (ref 0.0–0.4)
Eos: 5 %
Hematocrit: 40.5 % (ref 37.5–51.0)
Hemoglobin: 13.8 g/dL (ref 13.0–17.7)
Immature Grans (Abs): 0 10*3/uL (ref 0.0–0.1)
Immature Granulocytes: 1 %
Lymphocytes Absolute: 1.2 10*3/uL (ref 0.7–3.1)
Lymphs: 18 %
MCH: 31.9 pg (ref 26.6–33.0)
MCHC: 34.1 g/dL (ref 31.5–35.7)
MCV: 94 fL (ref 79–97)
Monocytes Absolute: 0.7 10*3/uL (ref 0.1–0.9)
Monocytes: 11 %
Neutrophils Absolute: 4.3 10*3/uL (ref 1.4–7.0)
Neutrophils: 64 %
Platelets: 241 10*3/uL (ref 150–450)
RBC: 4.32 x10E6/uL (ref 4.14–5.80)
RDW: 11.9 % (ref 11.6–15.4)
WBC: 6.5 10*3/uL (ref 3.4–10.8)

## 2023-01-10 LAB — LIPID PANEL
Chol/HDL Ratio: 3.1 ratio (ref 0.0–5.0)
Cholesterol, Total: 163 mg/dL (ref 100–199)
HDL: 52 mg/dL (ref >39)
LDL Chol Calc (NIH): 89 mg/dL (ref 0–99)
Triglycerides: 125 mg/dL (ref 0–149)
VLDL Cholesterol Cal: 22 mg/dL (ref 5–40)

## 2023-01-10 LAB — THYROID PANEL WITH TSH
Free Thyroxine Index: 1.6 (ref 1.2–4.9)
T3 Uptake Ratio: 25 % (ref 24–39)
T4, Total: 6.4 ug/dL (ref 4.5–12.0)
TSH: 1.46 u[IU]/mL (ref 0.450–4.500)

## 2023-01-10 LAB — VITAMIN D 25 HYDROXY (VIT D DEFICIENCY, FRACTURES): Vit D, 25-Hydroxy: 15.7 ng/mL — ABNORMAL LOW (ref 30.0–100.0)

## 2023-01-10 MED ORDER — VITAMIN D (ERGOCALCIFEROL) 1.25 MG (50000 UNIT) PO CAPS
50000.0000 [IU] | ORAL_CAPSULE | ORAL | 3 refills | Status: DC
Start: 2023-01-10 — End: 2023-11-05

## 2023-01-10 NOTE — Addendum Note (Signed)
Addended by: Sonny Masters on: 01/10/2023 08:02 AM   Modules accepted: Orders

## 2023-01-16 ENCOUNTER — Ambulatory Visit (HOSPITAL_COMMUNITY)
Admission: RE | Admit: 2023-01-16 | Discharge: 2023-01-16 | Disposition: A | Discharge status: Home / Self Care | Payer: Medicare Other | Source: Ambulatory Visit | Attending: Family Medicine | Admitting: Family Medicine

## 2023-01-16 DIAGNOSIS — R011 Cardiac murmur, unspecified: Secondary | ICD-10-CM | POA: Diagnosis not present

## 2023-01-16 LAB — ECHOCARDIOGRAM COMPLETE
AR max vel: 1.93 cm2
AV Area VTI: 2.02 cm2
AV Area mean vel: 2.34 cm2
AV Mean grad: 7 mmHg
AV Peak grad: 16.2 mmHg
Ao pk vel: 2.01 m/s
Area-P 1/2: 3.12 cm2
S' Lateral: 3.1 cm

## 2023-01-16 NOTE — Progress Notes (Signed)
*  PRELIMINARY RESULTS* Echocardiogram 2D Echocardiogram has been performed.  Kevin [name] 01/16/2023, 3:32 PM

## 2023-03-10 ENCOUNTER — Other Ambulatory Visit: Payer: Self-pay | Admitting: Family Medicine

## 2023-03-10 DIAGNOSIS — I10 Essential (primary) hypertension: Secondary | ICD-10-CM

## 2023-03-18 ENCOUNTER — Encounter: Payer: Self-pay | Admitting: Family Medicine

## 2023-03-18 ENCOUNTER — Encounter (HOSPITAL_COMMUNITY): Payer: Self-pay | Admitting: Hematology

## 2023-03-18 DIAGNOSIS — I10 Essential (primary) hypertension: Secondary | ICD-10-CM

## 2023-03-18 DIAGNOSIS — E781 Pure hyperglyceridemia: Secondary | ICD-10-CM

## 2023-03-19 MED ORDER — FENOFIBRATE 145 MG PO TABS
145.0000 mg | ORAL_TABLET | Freq: Every day | ORAL | 3 refills | Status: DC
Start: 2023-03-19 — End: 2023-09-11

## 2023-03-19 MED ORDER — METOPROLOL SUCCINATE ER 25 MG PO TB24
25.0000 mg | ORAL_TABLET | Freq: Every day | ORAL | 3 refills | Status: DC
Start: 2023-03-19 — End: 2023-09-11

## 2023-03-24 ENCOUNTER — Inpatient Hospital Stay: Payer: Medicare Other | Attending: Hematology

## 2023-03-24 VITALS — BP 143/63 | HR 55 | Temp 97.8°F | Resp 18

## 2023-03-24 DIAGNOSIS — Z95828 Presence of other vascular implants and grafts: Secondary | ICD-10-CM

## 2023-03-24 DIAGNOSIS — C8338 Diffuse large B-cell lymphoma, lymph nodes of multiple sites: Secondary | ICD-10-CM | POA: Diagnosis not present

## 2023-03-24 LAB — CBC
HCT: 38.8 % — ABNORMAL LOW (ref 39.0–52.0)
Hemoglobin: 12.8 g/dL — ABNORMAL LOW (ref 13.0–17.0)
MCH: 30.9 pg (ref 26.0–34.0)
MCHC: 33 g/dL (ref 30.0–36.0)
MCV: 93.7 fL (ref 80.0–100.0)
Platelets: 203 10*3/uL (ref 150–400)
RBC: 4.14 MIL/uL — ABNORMAL LOW (ref 4.22–5.81)
RDW: 12.2 % (ref 11.5–15.5)
WBC: 8.2 10*3/uL (ref 4.0–10.5)
nRBC: 0 % (ref 0.0–0.2)

## 2023-03-24 LAB — COMPREHENSIVE METABOLIC PANEL
ALT: 21 U/L (ref 0–44)
AST: 20 U/L (ref 15–41)
Albumin: 4 g/dL (ref 3.5–5.0)
Alkaline Phosphatase: 48 U/L (ref 38–126)
Anion gap: 9 (ref 5–15)
BUN: 21 mg/dL (ref 8–23)
CO2: 28 mmol/L (ref 22–32)
Calcium: 9.1 mg/dL (ref 8.9–10.3)
Chloride: 99 mmol/L (ref 98–111)
Creatinine, Ser: 1.03 mg/dL (ref 0.61–1.24)
GFR, Estimated: >60 mL/min (ref >60)
Glucose, Bld: 101 mg/dL — ABNORMAL HIGH (ref 70–99)
Potassium: 4.2 mmol/L (ref 3.5–5.1)
Sodium: 136 mmol/L (ref 135–145)
Total Bilirubin: 0.6 mg/dL (ref 0.3–1.2)
Total Protein: 6.6 g/dL (ref 6.5–8.1)

## 2023-03-24 LAB — LACTATE DEHYDROGENASE: LDH: 125 U/L (ref 98–192)

## 2023-03-24 MED ORDER — HEPARIN SOD (PORK) LOCK FLUSH 100 UNIT/ML IV SOLN
500.0000 [IU] | Freq: Once | INTRAVENOUS | Status: AC
  Administered 2023-03-24: 500 [IU] via INTRAVENOUS

## 2023-03-24 MED ORDER — SODIUM CHLORIDE 0.9% FLUSH
10.0000 mL | INTRAVENOUS | Status: DC | PRN
  Administered 2023-03-24: 10 mL via INTRAVENOUS

## 2023-03-24 NOTE — Progress Notes (Signed)
Patients port flushed without difficulty.  Good blood return noted with no bruising or swelling noted at site.  Band aid applied.  VSS with discharge and left in satisfactory condition with no s/s of distress noted.   

## 2023-03-26 ENCOUNTER — Ambulatory Visit
Admission: RE | Admit: 2023-03-26 | Discharge: 2023-03-26 | Disposition: A | Discharge status: Home / Self Care | Payer: Medicare Other | Source: Ambulatory Visit | Attending: Hematology | Admitting: Hematology

## 2023-03-26 VITALS — Wt 236.0 lb

## 2023-03-26 DIAGNOSIS — R590 Localized enlarged lymph nodes: Secondary | ICD-10-CM | POA: Diagnosis not present

## 2023-03-26 DIAGNOSIS — I700 Atherosclerosis of aorta: Secondary | ICD-10-CM | POA: Insufficient documentation

## 2023-03-26 DIAGNOSIS — C8338 Diffuse large B-cell lymphoma, lymph nodes of multiple sites: Secondary | ICD-10-CM | POA: Insufficient documentation

## 2023-03-26 DIAGNOSIS — C8515 Unspecified B-cell lymphoma, lymph nodes of inguinal region and lower limb: Secondary | ICD-10-CM | POA: Diagnosis not present

## 2023-03-26 LAB — GLUCOSE, CAPILLARY: Glucose-Capillary: 96 mg/dL (ref 70–99)

## 2023-03-26 MED ORDER — FLUDEOXYGLUCOSE F - 18 (FDG) INJECTION
12.7000 | Freq: Once | INTRAVENOUS | Status: AC | PRN
  Administered 2023-03-26: 12.7 via INTRAVENOUS

## 2023-03-31 ENCOUNTER — Ambulatory Visit: Payer: Medicare Other | Admitting: Hematology

## 2023-04-02 ENCOUNTER — Inpatient Hospital Stay: Payer: Medicare Other | Attending: Hematology | Admitting: Hematology

## 2023-04-02 ENCOUNTER — Encounter: Payer: Self-pay | Admitting: Hematology

## 2023-04-02 VITALS — BP 134/43 | HR 55 | Temp 98.6°F | Resp 18 | Wt 228.0 lb

## 2023-04-02 DIAGNOSIS — Z87891 Personal history of nicotine dependence: Secondary | ICD-10-CM | POA: Insufficient documentation

## 2023-04-02 DIAGNOSIS — Z86718 Personal history of other venous thrombosis and embolism: Secondary | ICD-10-CM | POA: Insufficient documentation

## 2023-04-02 DIAGNOSIS — C8338 Diffuse large B-cell lymphoma, lymph nodes of multiple sites: Secondary | ICD-10-CM | POA: Insufficient documentation

## 2023-04-02 DIAGNOSIS — Z79899 Other long term (current) drug therapy: Secondary | ICD-10-CM | POA: Insufficient documentation

## 2023-04-02 NOTE — Patient Instructions (Addendum)
[facility]  Discharge Instructions  You were seen and examined today by Dr. Ellin Saba.  Dr. Ellin Saba discussed your most recent lab work and PET scan which is stable.  Dr. Ellin Saba has recommended continue follow-ups and PET scans regularly unless you have a change in condition, such as fever, night sweats, fatigue or unintentional weight loss. If these start to occur, please call the Cancer Center as we will need to see you sooner should issues arise.  Follow-up as scheduled.  Thank you for choosing [facility] to provide your oncology and hematology care.   To afford each patient quality time with our provider, please arrive at least 15 minutes before your scheduled appointment time. You may need to reschedule your appointment if you arrive late (10 or more minutes). Arriving late affects you and other patients whose appointments are after yours.  Also, if you miss three or more appointments without notifying the office, you may be dismissed from the clinic at the provider's discretion.    Again, thank you for choosing [facility].  Our hope is that these requests will decrease the amount of time that you wait before being seen by our physicians.   If you have a lab appointment with the Cancer Center - please note that after April 8th, all labs will be drawn in the cancer center.  You do not have to check in or register with the main entrance as you have in the past but will complete your check-in at the cancer center.            _____________________________________________________________  Should you have questions after your visit to [facility] ([facility]), please contact our office at [phone] and follow the prompts.  Our office hours are 8:00 a.m. to 4:30 p.m. Monday - Thursday and 8:00 a.m. to 2:30 p.m. Friday.  Please note that voicemails left after 4:00 p.m. may not be returned until the following  business day.  We are closed weekends and all major holidays.  You do have access to a nurse 24-7, just call the main number to the clinic [phone] and do not press any options, hold on the line and a nurse will answer the phone.    For prescription refill requests, have your pharmacy contact our office and allow 72 hours.    Masks are no longer required in the cancer centers. If you would like for your care team to wear a mask while they are taking care of you, please let them know. You may have one support person who is at least 68 years old accompany you for your appointments.

## 2023-04-02 NOTE — Progress Notes (Signed)
[facility] [address] [place] [address]    Clinic Day:  04/02/2023  Referring physician: Sonny Masters, FNP  Patient Care Team: Sonny Masters, FNP as PCP - General (Family Medicine) Jena Gauss Gerrit Friends, MD as Consulting Physician (Gastroenterology) Doreatha Massed, MD as Consulting Physician (Hematology)   ASSESSMENT & PLAN:   Assessment: 1.  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 -6 cycles of R-EPOCH from 05/11/2019 through 08/23/2019. -PET scan on 09/17/2019 showed central abdominal mass with SUV 2.9, measuring 10 x 3.7 cm.  Mildly enlarged right pelvic sidewall lymph node 12 mm SUV 1.7.  Areas of multifocal bone involvement with near complete resolution.  Deauville 2/3 in the five-point scale. -He was evaluated by lymphoma specialist Dr. Alphonsa Gin at the request [facility].  His case was discussed at tumor board.  No adjuvant radiation therapy or maintenance rituximab or transplant was recommended at this time. -PET scan on 12/14/2019 shows soft tissue and nodularity at the root and within the small bowel mesentery, grossly similar measuring 4.2 x 11.2 cm with SUV max 3.6, similar to 3.2 previously.  External iliac and inguinal lymph nodes do not show metabolic some low blood pool.  SUV 2.3. -Enlarging rounded soft tissue in the right maxillary sinus with periosteal thickening and mild hypermetabolic zone. -PET scan on 03/27/2020 shows confluent soft tissue density in the central small bowel mesentery 11.6 x 4.2 cm with no significant change in size.  SUV 3.0, previously 3.6.  Small left para-aortic lymph node 1 cm with no FDG uptake.  Right external iliac lymph node, 10 mm with SUV 1.8, previously 2.3.  No new or progressive areas.     2.  Bilateral leg  DVT: -Ultrasound Doppler on 04/18/2019 showed bilateral leg DVT. -He is on Eliquis since then. -Repeat Doppler on 12/29/2019 shows previous extensive right lower extremity DVT has resolved with minor residual nonocclusive right peroneal thrombus noted.  Very minimal residual thrombus burden.  Negative left leg DVT. - Repeat Dopplers negative on 04/03/2020.  Eliquis was discontinued.   3.  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    Plan: 1.  Double hit lymphoma: - He denies any fevers, night sweats or weight loss.  He is continuing to work full-time.  Denies any recent infections. - Physical exam: Small nodule palpable in the area of right occipital nodule on PET scan.  Mild bilateral inguinal adenopathy palpable.  No splenomegaly. - Reviewed labs from 03/24/2023: LDH is normal.  LFTs are normal.  CBC grossly normal. - PET scan (03/26/2023): Images reviewed by me.  Right occipital scalp nodule reported as new was present on the PET scan from January.  Inguinal and suprapubic lymphadenopathy is also present on the previous scan but slightly increased in size.  He had a previous right inguinal lymph node biopsy in February of this year which was benign. - As he is not having any B symptoms, I have recommended continued surveillance at this time.  Will see him back in 6 months with repeat labs and scan.  He was told to come back sooner should he develop any B symptoms or severe fatigue.   Orders Placed This Encounter  Procedures   NM PET Image Restag (PS) Skull  Base To Thigh    Standing Status:   Future    Standing Expiration Date:   04/01/2024    Order Specific Question:   If indicated for the ordered procedure, I authorize the administration of a radiopharmaceutical per Radiology protocol    Answer:   Yes    Order Specific Question:   Preferred imaging location?    Answer:    [facility]    Order Specific Question:   Release to patient    Answer:   Immediate   CBC with Differential    Standing Status:   Future    Standing Expiration Date:   04/01/2024   Comprehensive metabolic panel    Standing Status:   Future    Standing Expiration Date:   04/01/2024   Lactate dehydrogenase    Standing Status:   Future    Standing Expiration Date:   04/01/2024      I,Katie Daubenspeck,acting as a scribe for Doreatha Massed, MD.,have documented all relevant documentation on the behalf of Doreatha Massed, MD,as directed by  Doreatha Massed, MD while in the presence of Doreatha Massed, MD.   I, Doreatha Massed MD, have reviewed the above documentation for accuracy and completeness, and I agree with the above.   Doreatha Massed, MD   10/2/20243:36 PM  CHIEF COMPLAINT:   Diagnosis: high-grade DLBCL    Cancer Staging  No matching staging information was found for the patient.    Prior Therapy: R-EPOCH x 6 cycles from 05/11/2019 to 08/23/2019   Current Therapy:  surveillance   HISTORY OF PRESENT ILLNESS:   Oncology History  Diffuse large B-cell lymphoma of lymph nodes of multiple regions (HCC)  04/26/2019 Initial Diagnosis   Diffuse large B-cell lymphoma of lymph nodes of multiple regions (HCC)   05/10/2019 - 08/30/2019 Chemotherapy   Patient is on Treatment Plan : IP NON-HODGKINS LYMPHOMA R-EPOCH q21d     05/11/2019 - 05/11/2019 Chemotherapy   The patient had DOXOrubicin (ADRIAMYCIN) chemo injection 104 mg, 50 mg/m2, Intravenous,  Once, 0 of 6 cycles palonosetron (ALOXI) injection 0.25 mg, 0.25 mg, Intravenous,  Once, 0 of 6 cycles pegfilgrastim-jmdb (FULPHILA) injection 6 mg, 6 mg, Subcutaneous,  Once, 0 of 6 cycles vinCRIStine (ONCOVIN) 2 mg in sodium chloride 0.9 % 50 mL chemo infusion, 2 mg, Intravenous,  Once, 0 of 6 cycles cyclophosphamide (CYTOXAN) 1,540 mg in sodium chloride 0.9 % 250 mL chemo infusion, 750 mg/m2,  Intravenous,  Once, 0 of 6 cycles fosaprepitant (EMEND) 150 mg, dexamethasone (DECADRON) 12 mg in sodium chloride 0.9 % 145 mL IVPB, , Intravenous,  Once, 0 of 6 cycles riTUXimab-pvvr (RUXIENCE) 800 mg in sodium chloride 0.9 % 250 mL (2.4242 mg/mL) infusion, 375 mg/m2, Intravenous,  Once, 0 of 6 cycles  for chemotherapy treatment.       INTERVAL HISTORY:   Novah is a 67 y.o. male presenting to clinic today for follow up of high-grade DLBCL. He was last seen by me on 09/04/22.  Since his last visit, he underwent surveillance PET scan on 03/26/23 showing: progressive hypermetabolic adenopathy in right occipital scalp, pelvis, and inguinal regions; no progressive disease in chest or abdomen; no visceral organ or osseous involvement; stable low level hypermetabolic activity within treated disease in central mesenteric fat.  Today, he states that he is doing well overall. His appetite level is at 100%. His energy level is at 100%.  PAST MEDICAL HISTORY:   Past Medical History: Past Medical History:  Diagnosis Date   Anxiety  Depression    Diffuse large B cell lymphoma (HCC) 04/26/2019   Hyperlipidemia 02/01/2014   Hypertension    Leg DVT (deep venous thromboembolism), acute, bilateral (HCC) 04/18/2019   Port-A-Cath in place 04/27/2019    Surgical History: Past Surgical History:  Procedure Laterality Date   COLONOSCOPY WITH PROPOFOL N/A 12/25/2020   Procedure: COLONOSCOPY WITH PROPOFOL;  Surgeon: Corbin Ade, MD;  [facility];  Service: Endoscopy;  Laterality: N/A;  2:15pm   CYSTOSCOPY W/ URETERAL STENT PLACEMENT Bilateral 04/21/2019   Procedure: CYSTOSCOPY WITH RETROGRADE PYELOGRAM/URETERAL STENT PLACEMENT;  Surgeon: Malen Gauze, MD;  Location: [facility];  Service: Urology;  Laterality: Bilateral;   ETHMOIDECTOMY Right 02/04/2020   Procedure: ETHMOIDECTOMY;  Surgeon: Newman Pies, MD;  Location: [facility];  Service: ENT;  Laterality: Right;   INGUINAL LYMPH  NODE BIOPSY Right 04/21/2019   Procedure: INGUINAL LYMPH NODE BIOPSY;  Surgeon: Lucretia Roers, MD;  Location: [facility];  Service: General;  Laterality: Right;   MAXILLARY ANTROSTOMY Right 02/04/2020   Procedure: MAXILLARY ANTROSTOMY WITH TISSUE REMOVAL;  Surgeon: Newman Pies, MD;  Location: [facility];  Service: ENT;  Laterality: Right;   PORTACATH PLACEMENT Left 05/07/2019   Procedure: INSERTION PORT-A-CATH (catheter left subclavian);  Surgeon: Lucretia Roers, MD;  Location: [facility];  Service: General;  Laterality: Left;    Social History: Social History   Socioeconomic History   Marital status: Married    Spouse name: Not on file   Number of children: 3   Years of education: 10   Highest education level: Not on file  Occupational History   Not on file  Tobacco Use   Smoking status: Former    Current packs/day: 0.00    Average packs/day: 3.0 packs/day for 30.0 years (90.0 ttl pk-yrs)    Types: Cigarettes    Start date: 04/26/1969    Quit date: 04/27/1999    Years since quitting: 23.9   Smokeless tobacco: Never  Vaping Use   Vaping status: Never Used  Substance and Sexual Activity   Alcohol use: Yes    Comment: daily. 2-6 beer daily   Drug use: Never   Sexual activity: Yes  Other Topics Concern   Not on file  Social History Narrative   Not on file   Social Determinants of Health   Financial Resource Strain: Low Risk  (10/22/2022)   Overall Financial Resource Strain (CARDIA)    Difficulty of Paying Living Expenses: Not hard at all  Food Insecurity: No Food Insecurity (10/22/2022)   Hunger Vital Sign    Worried About Running Out of Food in the Last Year: Never true    Ran Out of Food in the Last Year: Never true  Transportation Needs: No Transportation Needs (10/22/2022)   PRAPARE - Administrator, Civil Service (Medical): No    Lack of Transportation (Non-Medical): No  Physical Activity: Sufficiently Active (10/22/2022)   Exercise Vital Sign     Days of Exercise per Week: 5 days    Minutes of Exercise per Session: 30 min  Stress: No Stress Concern Present (10/22/2022)   [employer] of Occupational Health - Occupational Stress Questionnaire    Feeling of Stress : Not at all  Social Connections: Moderately Isolated (10/22/2022)   Social Connection and Isolation Panel [NHANES]    Frequency of Communication with Friends and Family: More than three times a week    Frequency of Social Gatherings with Friends and Family: More  than three times a week    Attends Religious Services: Never    Active Member of Clubs or Organizations: No    Attends Banker Meetings: Never    Marital Status: Married  Catering manager Violence: Not At Risk (10/22/2022)   Humiliation, Afraid, Rape, and Kick questionnaire    Fear of Current or Ex-Partner: No    Emotionally Abused: No    Physically Abused: No    Sexually Abused: No    Family History: Family History  Problem Relation Age of Onset   Diabetes Brother    Diabetes Mother    Leukemia Brother    Diabetes Son    Colon cancer Neg Hx     Current Medications:  Current Outpatient Medications:    amLODipine (NORVASC) 10 MG tablet, Take 1 tablet (10 mg total) by mouth daily., Disp: 90 tablet, Rfl: 1   aspirin EC 81 MG tablet, Take 81 mg by mouth daily. Swallow whole., Disp: , Rfl:    escitalopram (LEXAPRO) 10 MG tablet, Take 1 tablet (10 mg total) by mouth daily., Disp: 90 tablet, Rfl: 1   fenofibrate (TRICOR) 145 MG tablet, Take 1 tablet (145 mg total) by mouth daily., Disp: 90 tablet, Rfl: 3   fluticasone (FLONASE) 50 MCG/ACT nasal spray, Place 2 sprays into both nostrils daily., Disp: 16 g, Rfl: 6   LORazepam (ATIVAN) 0.5 MG tablet, TAKE 1 TABLET (0.5 MG TOTAL) BY MOUTH EVERY 8 (EIGHT) HOURS. (Patient taking differently: Take 0.5 mg by mouth every 8 (eight) hours as needed for anxiety.), Disp: 60 tablet, Rfl: 3   losartan-hydrochlorothiazide (HYZAAR) 100-25 MG tablet, Take 1  tablet by mouth daily., Disp: 90 tablet, Rfl: 1   metoprolol succinate (TOPROL-XL) 25 MG 24 hr tablet, Take 1 tablet (25 mg total) by mouth daily., Disp: 90 tablet, Rfl: 3   Vitamin D, Ergocalciferol, (DRISDOL) 1.25 MG (50000 UNIT) CAPS capsule, Take 1 capsule (50,000 Units total) by mouth every 7 (seven) days., Disp: 5 capsule, Rfl: 3 No current facility-administered medications for this visit.  Facility-Administered Medications Ordered in Other Visits:    0.9 %  sodium chloride infusion, , Intravenous, Continuous, Doreatha Massed, MD, Last Rate: 20 mL/hr at 08/26/19 1105, New Bag at 08/26/19 1105   sodium chloride flush (NS) 0.9 % injection 10 mL, 10 mL, Intravenous, PRN, Doreatha Massed, MD, 10 mL at 08/26/19 1104   sodium chloride flush (NS) 0.9 % injection 10 mL, 10 mL, Intravenous, PRN, Doreatha Massed, MD, 10 mL at 06/22/21 1107   Allergies: No Known Allergies  REVIEW OF SYSTEMS:   Review of Systems  Constitutional:  Negative for chills, fatigue and fever.  HENT:   Negative for lump/mass, mouth sores, nosebleeds, sore throat and trouble swallowing.   Eyes:  Negative for eye problems.  Respiratory:  Negative for cough and shortness of breath.   Cardiovascular:  Negative for chest pain, leg swelling and palpitations.  Gastrointestinal:  Negative for abdominal pain, constipation, diarrhea, nausea and vomiting.  Genitourinary:  Negative for bladder incontinence, difficulty urinating, dysuria, frequency, hematuria and nocturia.   Musculoskeletal:  Negative for arthralgias, back pain, flank pain, myalgias and neck pain.  Skin:  Negative for itching and rash.  Neurological:  Negative for dizziness, headaches and numbness.  Hematological:  Does not bruise/bleed easily.  Psychiatric/Behavioral:  Negative for depression, sleep disturbance and suicidal ideas. The patient is not nervous/anxious.   All other systems reviewed and are negative.    VITALS:   Blood pressure Marland Kitchen)  134/43, pulse (!) 55, temperature 98.6 F (37 C), temperature source Oral, resp. rate 18, weight 228 lb (103.4 kg), SpO2 94%.  Wt Readings from Last 3 Encounters:  04/02/23 228 lb (103.4 kg)  03/26/23 236 lb (107 kg)  01/09/23 223 lb 6.4 oz (101.3 kg)    Body mass index is 36.8 kg/m.  Performance status (ECOG): 1 - Symptomatic but completely ambulatory  PHYSICAL EXAM:   Physical Exam Vitals and nursing note reviewed. Exam conducted with a chaperone present.  Constitutional:      Appearance: Normal appearance.  Cardiovascular:     Rate and Rhythm: Normal rate and regular rhythm.     Pulses: Normal pulses.     Heart sounds: Normal heart sounds.  Pulmonary:     Effort: Pulmonary effort is normal.     Breath sounds: Normal breath sounds.  Abdominal:     Palpations: Abdomen is soft. There is no hepatomegaly, splenomegaly or mass.     Tenderness: There is no abdominal tenderness.  Musculoskeletal:     Right lower leg: No edema.     Left lower leg: No edema.  Lymphadenopathy:     Cervical: No cervical adenopathy.     Right cervical: No superficial, deep or posterior cervical adenopathy.    Left cervical: No superficial, deep or posterior cervical adenopathy.     Upper Body:     Right upper body: No supraclavicular or axillary adenopathy.     Left upper body: No supraclavicular or axillary adenopathy.  Neurological:     General: No focal deficit present.     Mental Status: He is alert and oriented to person, place, and time.  Psychiatric:        Mood and Affect: Mood normal.        Behavior: Behavior normal.     LABS:      Latest Ref Rng & Units 03/24/2023    1:53 PM 01/09/2023    2:38 PM 12/31/2022    2:33 PM  CBC  WBC 4.0 - 10.5 K/uL 8.2  6.5  6.9   Hemoglobin 13.0 - 17.0 g/dL 16.1  09.6  04.5   Hematocrit 39.0 - 52.0 % 38.8  40.5  38.1   Platelets 150 - 400 K/uL 203  241  202       Latest Ref Rng & Units 03/24/2023    1:53 PM 01/09/2023    2:38 PM 12/31/2022    2:33  PM  CMP  Glucose 70 - 99 mg/dL 409  811  914   BUN 8 - 23 mg/dL 21  23  17    Creatinine 0.61 - 1.24 mg/dL 7.82  9.56  2.13   Sodium 135 - 145 mmol/L 136  139  137   Potassium 3.5 - 5.1 mmol/L 4.2  4.6  4.2   Chloride 98 - 111 mmol/L 99  100  101   CO2 22 - 32 mmol/L 28  24  28    Calcium 8.9 - 10.3 mg/dL 9.1  9.6  9.1   Total Protein 6.5 - 8.1 g/dL 6.6  6.4  6.7   Total Bilirubin 0.3 - 1.2 mg/dL 0.6  0.3  0.7   Alkaline Phos 38 - 126 U/L 48  53  38   AST 15 - 41 U/L 20  21  29    ALT 0 - 44 U/L 21  18  22       No results found for: "CEA1", "CEA" / No results found for: "CEA1", "CEA" No  results found for: "PSA1" No results found for: "CAN199" No results found for: "CAN125"  No results found for: "TOTALPROTELP", "ALBUMINELP", "A1GS", "A2GS", "BETS", "BETA2SER", "GAMS", "MSPIKE", "SPEI" Lab Results  Component Value Date   TIBC 335 11/24/2019   TIBC 284 10/25/2019   FERRITIN 220 11/24/2019   FERRITIN 236 10/25/2019   FERRITIN 318 04/17/2019   IRONPCTSAT 10 (L) 11/24/2019   IRONPCTSAT 8 (L) 10/25/2019   Lab Results  Component Value Date   LDH 125 03/24/2023   LDH 129 12/31/2022   LDH 132 07/18/2022     STUDIES:   NM PET Image Restag (PS) Skull Base To Thigh  Result Date: 04/01/2023 CLINICAL DATA:  Subsequent treatment strategy for diffuse large B-cell lymphoma. EXAM: NUCLEAR MEDICINE PET SKULL BASE TO THIGH TECHNIQUE: 12.71 mCi F-18 FDG was injected intravenously. Full-ring PET imaging was performed from the skull base to thigh after the radiotracer. CT data was obtained and used for attenuation correction and anatomic localization. This PET facility now utilizes time-of-flight PET imaging. This new technology can increase SUV measurements by 16- 20 % over conventional PET technology. Fasting blood glucose: 96 mg/dl COMPARISON:  PET-CT 16/04/9603 and 04/11/2022. FINDINGS: Mediastinal blood pool activity: SUV max 2.9 Liver activity: SUV max 4.6 NECK: New hypermetabolic right  occipital scalp nodule measuring 1.1 x 0.8 cm on image 14/6 with an SUV max of 9.8, likely a lymph node. No other significant hypermetabolic nodal activity in the neck. Probable tiny incidental right parotid lesion with an SUV max of 3.2. No suspicious activity identified within the pharyngeal mucosal space. Incidental CT findings: Bilateral carotid atherosclerosis. CHEST: There are no hypermetabolic mediastinal, hilar or axillary lymph nodes. No hypermetabolic pulmonary activity or suspicious nodularity. Incidental CT findings: Left subclavian Port-A-Cath extends to the upper SVC level. Atherosclerosis of the aorta, great vessels and coronary arteries. ABDOMEN/PELVIS: There is no hypermetabolic activity within the liver, adrenal glands, spleen or pancreas. There is similar low level hypermetabolic activity within the treated disease in the central mesenteric fat (SUV max 3.8). The supravesical soft tissue nodules in the anterior aspect of the false pelvis are unchanged in size. The larger nodule measures 2.3 x 1.2 cm on image 133/6 and remains hypermetabolic with an SUV max of 8.3. Subjectively, the metabolic activity within this nodule has slightly increased from the previous study and previously had an SUV max of 2.3. Left suprapubic node has slightly enlarged, measuring 9 mm on image 143/6 (SUV max 7.4). A small nodule adjacent to the penis has slightly enlarged, measuring 1.8 x 0.8 cm on image 149/6 (SUV max 10.3). Previously demonstrated inguinal lymph nodes have definitely enlarged with increased metabolic activity. Right inguinal node measures 3.2 x 2.3 cm on image 163/6 and has an SUV max of 15.7 (previously 9.7), and left inguinal node measures 2.4 x 2.0 cm on image 161/6 (SUV max 12.0, previously 7.1). Incidental CT findings: Aortic and branch vessel atherosclerosis. Diverticular changes in the distal colon. Stable incidental left adrenal adenoma measuring 1.9 cm and -13 HU; no specific follow-up imaging  recommended. SKELETON: There is no hypermetabolic activity to suggest osseous metastatic disease. 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 IMPRESSION: 1. Progressive hypermetabolic adenopathy in the right occipital scalp, pelvis and inguinal regions consistent with progressive lymphoma (Deauville 5). 2. No progressive disease in the chest or abdomen. No visceral organ or osseous involvement identified. 3. Stable low level hypermetabolic activity within the treated disease  in the central mesenteric fat. 4.  Aortic Atherosclerosis (ICD10-I70.0). Electronically Signed   By: Carey Bullocks M.D.   On: 04/01/2023 16:19

## 2023-07-11 ENCOUNTER — Encounter: Payer: Medicare Other | Admitting: Family Medicine

## 2023-09-11 ENCOUNTER — Other Ambulatory Visit: Payer: Self-pay | Admitting: Family Medicine

## 2023-09-11 DIAGNOSIS — I10 Essential (primary) hypertension: Secondary | ICD-10-CM

## 2023-09-11 DIAGNOSIS — E781 Pure hyperglyceridemia: Secondary | ICD-10-CM

## 2023-09-11 NOTE — Telephone Encounter (Signed)
 Copied from CRM [id number]. Topic: Clinical - Medication Refill >> Sep 11, 2023 12:34 PM Abundio Miu S wrote: Most Recent Primary Care Visit:   Medication:  metoprolol succinate (TOPROL-XL) 25 MG 24 hr tablet  fenofibrate (TRICOR) 145 MG tablet   Has the patient contacted their pharmacy? No (Agent: If no, request that the patient contact the pharmacy for the refill. If patient does not wish to contact the pharmacy document the reason why and proceed with request.) (Agent: If yes, when and what did the pharmacy advise?)  Is this the correct pharmacy for this prescription? Yes If no, delete pharmacy and type the correct one.  This is the patient's preferred pharmacy:  [id number] - [place], [place] - [address] [place] [address] Phone: [phone] Fax: [phone]  [id number] - [place], [place] - [address] AT [facility] [address] [place] [address] Phone: [phone] Fax: [phone]  [id number] - [place], [place] - [address] [place] [address] Phone: [phone] Fax: [phone]   Has the prescription been filled recently? No  Is the patient out of the medication? Yes  Has the patient been seen for an appointment in the last year OR does the patient have an upcoming appointment? Yes  Can we respond through MyChart? Yes  Agent: Please be advised that Rx refills may take up to 3 business days. We ask that you follow-up with your pharmacy.

## 2023-09-12 MED ORDER — FENOFIBRATE 145 MG PO TABS: 145.0000 mg | ORAL_TABLET | Freq: Every day | ORAL | 3 refills | Status: DC

## 2023-09-12 MED ORDER — METOPROLOL SUCCINATE ER 25 MG PO TB24: 25.0000 mg | ORAL_TABLET | Freq: Every day | ORAL | 3 refills | Status: DC

## 2023-10-02 ENCOUNTER — Inpatient Hospital Stay: Payer: Medicare Other | Attending: Hematology

## 2023-10-02 ENCOUNTER — Other Ambulatory Visit (HOSPITAL_COMMUNITY): Payer: Medicare Other

## 2023-10-02 DIAGNOSIS — Z79899 Other long term (current) drug therapy: Secondary | ICD-10-CM | POA: Insufficient documentation

## 2023-10-02 DIAGNOSIS — Z86718 Personal history of other venous thrombosis and embolism: Secondary | ICD-10-CM | POA: Insufficient documentation

## 2023-10-02 DIAGNOSIS — C8338 Diffuse large B-cell lymphoma, lymph nodes of multiple sites: Secondary | ICD-10-CM | POA: Insufficient documentation

## 2023-10-02 DIAGNOSIS — Z87891 Personal history of nicotine dependence: Secondary | ICD-10-CM | POA: Insufficient documentation

## 2023-10-05 ENCOUNTER — Other Ambulatory Visit: Payer: Self-pay | Admitting: Family Medicine

## 2023-10-05 DIAGNOSIS — F419 Anxiety disorder, unspecified: Secondary | ICD-10-CM

## 2023-10-05 DIAGNOSIS — I10 Essential (primary) hypertension: Secondary | ICD-10-CM

## 2023-10-06 ENCOUNTER — Ambulatory Visit
Admission: RE | Admit: 2023-10-06 | Discharge: 2023-10-06 | Disposition: A | Discharge status: Home / Self Care | Payer: Medicare Other | Source: Ambulatory Visit | Attending: Hematology | Admitting: Hematology

## 2023-10-06 DIAGNOSIS — C8591 Non-Hodgkin lymphoma, unspecified, lymph nodes of head, face, and neck: Secondary | ICD-10-CM | POA: Diagnosis not present

## 2023-10-06 DIAGNOSIS — C8338 Diffuse large B-cell lymphoma, lymph nodes of multiple sites: Secondary | ICD-10-CM | POA: Insufficient documentation

## 2023-10-06 LAB — GLUCOSE, CAPILLARY: Glucose-Capillary: 114 mg/dL — ABNORMAL HIGH (ref 70–99)

## 2023-10-06 MED ORDER — FLUDEOXYGLUCOSE F - 18 (FDG) INJECTION
12.3900 | Freq: Once | INTRAVENOUS | Status: AC | PRN
  Administered 2023-10-06: 12.39 via INTRAVENOUS

## 2023-10-09 ENCOUNTER — Ambulatory Visit: Payer: Medicare Other | Admitting: Hematology

## 2023-10-15 ENCOUNTER — Inpatient Hospital Stay: Payer: Medicare Other | Admitting: Hematology

## 2023-10-15 ENCOUNTER — Inpatient Hospital Stay

## 2023-10-15 VITALS — BP 164/64 | HR 56 | Temp 97.8°F | Resp 18 | Ht 66.0 in | Wt 230.0 lb

## 2023-10-15 DIAGNOSIS — R220 Localized swelling, mass and lump, head: Secondary | ICD-10-CM

## 2023-10-15 DIAGNOSIS — C8338 Diffuse large B-cell lymphoma, lymph nodes of multiple sites: Secondary | ICD-10-CM | POA: Diagnosis not present

## 2023-10-15 DIAGNOSIS — Z79899 Other long term (current) drug therapy: Secondary | ICD-10-CM | POA: Diagnosis not present

## 2023-10-15 DIAGNOSIS — Z86718 Personal history of other venous thrombosis and embolism: Secondary | ICD-10-CM | POA: Diagnosis not present

## 2023-10-15 DIAGNOSIS — Z87891 Personal history of nicotine dependence: Secondary | ICD-10-CM | POA: Diagnosis not present

## 2023-10-15 LAB — COMPREHENSIVE METABOLIC PANEL WITH GFR
ALT: 21 U/L (ref 0–44)
AST: 28 U/L (ref 15–41)
Albumin: 4.2 g/dL (ref 3.5–5.0)
Alkaline Phosphatase: 44 U/L (ref 38–126)
Anion gap: 13 (ref 5–15)
BUN: 20 mg/dL (ref 8–23)
CO2: 28 mmol/L (ref 22–32)
Calcium: 9.7 mg/dL (ref 8.9–10.3)
Chloride: 96 mmol/L — ABNORMAL LOW (ref 98–111)
Creatinine, Ser: 1.05 mg/dL (ref 0.61–1.24)
GFR, Estimated: >60 mL/min (ref >60)
Glucose, Bld: 133 mg/dL — ABNORMAL HIGH (ref 70–99)
Potassium: 4.1 mmol/L (ref 3.5–5.1)
Sodium: 137 mmol/L (ref 135–145)
Total Bilirubin: 0.6 mg/dL (ref 0.0–1.2)
Total Protein: 7 g/dL (ref 6.5–8.1)

## 2023-10-15 LAB — CBC WITH DIFFERENTIAL/PLATELET
Abs Immature Granulocytes: 0.06 10*3/uL (ref 0.00–0.07)
Basophils Absolute: 0.1 10*3/uL (ref 0.0–0.1)
Basophils Relative: 1 %
Eosinophils Absolute: 0.4 10*3/uL (ref 0.0–0.5)
Eosinophils Relative: 6 %
HCT: 38.9 % — ABNORMAL LOW (ref 39.0–52.0)
Hemoglobin: 13.3 g/dL (ref 13.0–17.0)
Immature Granulocytes: 1 %
Lymphocytes Relative: 17 %
Lymphs Abs: 1.3 10*3/uL (ref 0.7–4.0)
MCH: 31.9 pg (ref 26.0–34.0)
MCHC: 34.2 g/dL (ref 30.0–36.0)
MCV: 93.3 fL (ref 80.0–100.0)
Monocytes Absolute: 0.7 10*3/uL (ref 0.1–1.0)
Monocytes Relative: 9 %
Neutro Abs: 4.8 10*3/uL (ref 1.7–7.7)
Neutrophils Relative %: 66 %
Platelets: 273 10*3/uL (ref 150–400)
RBC: 4.17 MIL/uL — ABNORMAL LOW (ref 4.22–5.81)
RDW: 11.9 % (ref 11.5–15.5)
WBC: 7.2 10*3/uL (ref 4.0–10.5)
nRBC: 0 % (ref 0.0–0.2)

## 2023-10-15 LAB — LACTATE DEHYDROGENASE: LDH: 130 U/L (ref 98–192)

## 2023-10-15 NOTE — Patient Instructions (Addendum)
 [facility] at [facility] Discharge Instructions   You were seen and examined today by Dr. Cheree Cords.  He reviewed the results of your PET scan. It is showing some of the lymph nodes have increased in size, particularly in the chest, groin, and pelvis.   We will see you back after the biospy.   Return as scheduled.    Thank you for choosing [facility] at [facility] to provide your oncology and hematology care.  To afford each patient quality time with our provider, please arrive at least 15 minutes before your scheduled appointment time.   If you have a lab appointment with the Cancer Center please come in thru the Main Entrance and check in at the main information desk.  You need to re-schedule your appointment should you arrive 10 or more minutes late.  We strive to give you quality time with our providers, and arriving late affects you and other patients whose appointments are after yours.  Also, if you no show three or more times for appointments you may be dismissed from the clinic at the providers discretion.     Again, thank you for choosing [facility].  Our hope is that these requests will decrease the amount of time that you wait before being seen by our physicians.       _____________________________________________________________  Should you have questions after your visit to [facility], please contact our office at [phone] and follow the prompts.  Our office hours are 8:00 a.m. and 4:30 p.m. Monday - Friday.  Please note that voicemails left after 4:00 p.m. may not be returned until the following business day.  We are closed weekends and major holidays.  You do have access to a nurse 24-7, just call the main number to the clinic [phone] and do not press any options, hold on the line and a nurse will answer the phone.    For prescription refill requests, have your pharmacy contact our office and  allow 72 hours.    Due to Covid, you will need to wear a mask upon entering the hospital. If you do not have a mask, a mask will be given to you at the Main Entrance upon arrival. For doctor visits, patients may have 1 support person age 42 or older with them. For treatment visits, patients can not have anyone with them due to social distancing guidelines and our immunocompromised population.

## 2023-10-15 NOTE — Progress Notes (Signed)
 [facility] [address] [place] [address]    Clinic Day:  10/15/2023  Referring physician: Sonny Masters, FNP  Patient Care Team: Sonny Masters, FNP as PCP - General (Family Medicine) Jena Gauss Gerrit Friends, MD as Consulting Physician (Gastroenterology) Doreatha Massed, MD as Consulting Physician (Hematology)   ASSESSMENT & PLAN:   Assessment: 1.  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 -6 cycles of R-EPOCH from 05/11/2019 through 08/23/2019. -PET scan on 09/17/2019 showed central abdominal mass with SUV 2.9, measuring 10 x 3.7 cm.  Mildly enlarged right pelvic sidewall lymph node 12 mm SUV 1.7.  Areas of multifocal bone involvement with near complete resolution.  Deauville 2/3 in the five-point scale. -He was evaluated by lymphoma specialist Dr. Alphonsa Gin at the request [facility].  His case was discussed at tumor board.  No adjuvant radiation therapy or maintenance rituximab or transplant was recommended at this time. -PET scan on 12/14/2019 shows soft tissue and nodularity at the root and within the small bowel mesentery, grossly similar measuring 4.2 x 11.2 cm with SUV max 3.6, similar to 3.2 previously.  External iliac and inguinal lymph nodes do not show metabolic some low blood pool.  SUV 2.3. -Enlarging rounded soft tissue in the right maxillary sinus with periosteal thickening and mild hypermetabolic zone. -PET scan on 03/27/2020 shows confluent soft tissue density in the central small bowel mesentery 11.6 x 4.2 cm with no significant change in size.  SUV 3.0, previously 3.6.  Small left para-aortic lymph node 1 cm with no FDG uptake.  Right external iliac lymph node, 10 mm with SUV 1.8, previously 2.3.  No new or progressive areas.     2.  Bilateral leg  DVT: -Ultrasound Doppler on 04/18/2019 showed bilateral leg DVT. -He is on Eliquis since then. -Repeat Doppler on 12/29/2019 shows previous extensive right lower extremity DVT has resolved with minor residual nonocclusive right peroneal thrombus noted.  Very minimal residual thrombus burden.  Negative left leg DVT. - Repeat Dopplers negative on 04/03/2020.  Eliquis was discontinued.   3.  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    Plan: 1.  Double hit lymphoma: - He denies any fevers, night sweats or weight loss. - No infections in the preceding 6 months.  He is working full-time. - PET scan (10/06/2023): Overall slightly increased in size of several nodal tissue, predominantly in the right occipital subcutaneous nodule.  It is easily palpable.  I have reviewed images of the PET scan and compared it with previous scans. - I have recommended ultrasound-guided biopsy and follow-up after that. - He forgot to do labs last week.  Will do labs today. - Differential includes indolent follicular lymphoma which was in the background of DLBCL at the time of diagnosis.   No orders of the defined types were placed in this encounter.      Doreatha Massed, MD   4/16/20253:16 PM  CHIEF COMPLAINT:   Diagnosis: high-grade DLBCL    Cancer Staging  No matching staging information was found for the patient.    Prior Therapy: R-EPOCH x 6 cycles from 05/11/2019 to 08/23/2019   Current Therapy:  surveillance   HISTORY OF PRESENT ILLNESS:   Oncology History  Diffuse large B-cell  lymphoma of lymph nodes of multiple regions (HCC)  04/26/2019 Initial Diagnosis   Diffuse large B-cell lymphoma of lymph nodes of multiple regions [facility])   05/10/2019 - 08/30/2019 Chemotherapy   Patient is on Treatment Plan : IP NON-HODGKINS LYMPHOMA R-EPOCH q21d     05/11/2019 - 05/11/2019  Chemotherapy   The patient had DOXOrubicin (ADRIAMYCIN) chemo injection 104 mg, 50 mg/m2, Intravenous,  Once, 0 of 6 cycles palonosetron (ALOXI) injection 0.25 mg, 0.25 mg, Intravenous,  Once, 0 of 6 cycles pegfilgrastim-jmdb (FULPHILA) injection 6 mg, 6 mg, Subcutaneous,  Once, 0 of 6 cycles vinCRIStine (ONCOVIN) 2 mg in sodium chloride 0.9 % 50 mL chemo infusion, 2 mg, Intravenous,  Once, 0 of 6 cycles cyclophosphamide (CYTOXAN) 1,540 mg in sodium chloride 0.9 % 250 mL chemo infusion, 750 mg/m2, Intravenous,  Once, 0 of 6 cycles fosaprepitant (EMEND) 150 mg, dexamethasone (DECADRON) 12 mg in sodium chloride 0.9 % 145 mL IVPB, , Intravenous,  Once, 0 of 6 cycles riTUXimab-pvvr (RUXIENCE) 800 mg in sodium chloride 0.9 % 250 mL (2.4242 mg/mL) infusion, 375 mg/m2, Intravenous,  Once, 0 of 6 cycles  for chemotherapy treatment.       INTERVAL HISTORY:   Eshawn is a 68 y.o. male seen for follow-up of large B-cell lymphoma, high-grade.  Reports appetite of 100% and energy levels of 75%.  Denies any new onset pains.  Denies any recent infections.  He is continuing to work full-time.  PAST MEDICAL HISTORY:   Past Medical History: Past Medical History:  Diagnosis Date   Anxiety    Depression    Diffuse large B cell lymphoma (HCC) 04/26/2019   Hyperlipidemia 02/01/2014   Hypertension    Leg DVT (deep venous thromboembolism), acute, bilateral (HCC) 04/18/2019   Port-A-Cath in place 04/27/2019    Surgical History: Past Surgical History:  Procedure Laterality Date   COLONOSCOPY WITH PROPOFOL N/A 12/25/2020   Procedure: COLONOSCOPY WITH PROPOFOL;  Surgeon: Corbin Ade, MD;  Location: [facility];  Service: Endoscopy;  Laterality: N/A;  2:15pm   CYSTOSCOPY W/ URETERAL STENT PLACEMENT Bilateral 04/21/2019   Procedure: CYSTOSCOPY WITH RETROGRADE PYELOGRAM/URETERAL STENT PLACEMENT;  Surgeon: Malen Gauze, MD;  Location: [facility];  Service: Urology;  Laterality: Bilateral;   ETHMOIDECTOMY  Right 02/04/2020   Procedure: ETHMOIDECTOMY;  Surgeon: Newman Pies, MD;  Location: [facility];  Service: ENT;  Laterality: Right;   INGUINAL LYMPH NODE BIOPSY Right 04/21/2019   Procedure: INGUINAL LYMPH NODE BIOPSY;  Surgeon: Lucretia Roers, MD;  Location: [facility];  Service: General;  Laterality: Right;   MAXILLARY ANTROSTOMY Right 02/04/2020   Procedure: MAXILLARY ANTROSTOMY WITH TISSUE REMOVAL;  Surgeon: Newman Pies, MD;  Location: [facility];  Service: ENT;  Laterality: Right;   PORTACATH PLACEMENT Left 05/07/2019   Procedure: INSERTION PORT-A-CATH (catheter left subclavian);  Surgeon: Lucretia Roers, MD;  Location: [facility];  Service: General;  Laterality: Left;    Social History: Social History   Socioeconomic History   Marital status: Married    Spouse name: Not on file   Number of children: 3   Years of education: 10   Highest education level: Not on file  Occupational History   Not on file  Tobacco Use   Smoking status: Former    Current packs/day: 0.00    Average packs/day: 3.0 packs/day for 30.0 years (90.0 ttl pk-yrs)    Types: Cigarettes    Start date: 04/26/1969    Quit date: 04/27/1999  Years since quitting: 24.4   Smokeless tobacco: Never  Vaping Use   Vaping status: Never Used  Substance and Sexual Activity   Alcohol use: Yes    Comment: daily. 2-6 beer daily   Drug use: Never   Sexual activity: Yes  Other Topics Concern   Not on file  Social History Narrative   Not on file   Social Drivers of Health   Financial Resource Strain: Low Risk  (10/22/2022)   Overall Financial Resource Strain (CARDIA)    Difficulty of Paying Living Expenses: Not hard at all  Food Insecurity: No Food Insecurity (10/22/2022)   Hunger Vital Sign    Worried About Running Out of Food in the Last Year: Never true    Ran Out of Food in the Last Year: Never true  Transportation Needs: No Transportation Needs (10/22/2022)   PRAPARE - Doctor, general practice (Medical): No    Lack of Transportation (Non-Medical): No  Physical Activity: Sufficiently Active (10/22/2022)   Exercise Vital Sign    Days of Exercise per Week: 5 days    Minutes of Exercise per Session: 30 min  Stress: No Stress Concern Present (10/22/2022)   [employer] of Occupational Health - Occupational Stress Questionnaire    Feeling of Stress : Not at all  Social Connections: Moderately Isolated (10/22/2022)   Social Connection and Isolation Panel [NHANES]    Frequency of Communication with Friends and Family: More than three times a week    Frequency of Social Gatherings with Friends and Family: More than three times a week    Attends Religious Services: Never    Database administrator or Organizations: No    Attends Banker Meetings: Never    Marital Status: Married  Catering manager Violence: Not At Risk (10/22/2022)   Humiliation, Afraid, Rape, and Kick questionnaire    Fear of Current or Ex-Partner: No    Emotionally Abused: No    Physically Abused: No    Sexually Abused: No    Family History: Family History  Problem Relation Age of Onset   Diabetes Brother    Diabetes Mother    Leukemia Brother    Diabetes Son    Colon cancer Neg Hx     Current Medications:  Current Outpatient Medications:    amLODipine (NORVASC) 10 MG tablet, Take 1 tablet (10 mg total) by mouth daily. **NEEDS TO BE SEEN BEFORE NEXT REFILL**, Disp: 30 tablet, Rfl: 0   aspirin EC 81 MG tablet, Take 81 mg by mouth daily. Swallow whole., Disp: , Rfl:    escitalopram (LEXAPRO) 10 MG tablet, Take 1 tablet (10 mg total) by mouth daily. **NEEDS TO BE SEEN BEFORE NEXT REFILL**, Disp: 30 tablet, Rfl: 0   fenofibrate (TRICOR) 145 MG tablet, Take 1 tablet (145 mg total) by mouth daily., Disp: 90 tablet, Rfl: 3   fluticasone (FLONASE) 50 MCG/ACT nasal spray, Place 2 sprays into both nostrils daily., Disp: 16 g, Rfl: 6   LORazepam (ATIVAN) 0.5 MG tablet, TAKE 1 TABLET  (0.5 MG TOTAL) BY MOUTH EVERY 8 (EIGHT) HOURS. (Patient taking differently: Take 0.5 mg by mouth every 8 (eight) hours as needed for anxiety.), Disp: 60 tablet, Rfl: 3   losartan-hydrochlorothiazide (HYZAAR) 100-25 MG tablet, Take 1 tablet by mouth daily. **NEEDS TO BE SEEN BEFORE NEXT REFILL**, Disp: 30 tablet, Rfl: 0   metoprolol succinate (TOPROL-XL) 25 MG 24 hr tablet, Take 1 tablet (25 mg total) by mouth daily., Disp:  90 tablet, Rfl: 3   Vitamin D, Ergocalciferol, (DRISDOL) 1.25 MG (50000 UNIT) CAPS capsule, Take 1 capsule (50,000 Units total) by mouth every 7 (seven) days., Disp: 5 capsule, Rfl: 3 No current facility-administered medications for this visit.  Facility-Administered Medications Ordered in Other Visits:    0.9 %  sodium chloride infusion, , Intravenous, Continuous, Doreatha Massed, MD, Last Rate: 20 mL/hr at 08/26/19 1105, New Bag at 08/26/19 1105   sodium chloride flush (NS) 0.9 % injection 10 mL, 10 mL, Intravenous, PRN, Doreatha Massed, MD, 10 mL at 08/26/19 1104   sodium chloride flush (NS) 0.9 % injection 10 mL, 10 mL, Intravenous, PRN, Doreatha Massed, MD, 10 mL at 06/22/21 1107   Allergies: No Known Allergies  REVIEW OF SYSTEMS:   Review of Systems  Constitutional:  Negative for chills, fatigue and fever.  HENT:   Negative for lump/mass, mouth sores, nosebleeds, sore throat and trouble swallowing.   Eyes:  Negative for eye problems.  Respiratory:  Negative for cough and shortness of breath.   Cardiovascular:  Negative for chest pain, leg swelling and palpitations.  Gastrointestinal:  Negative for abdominal pain, constipation, diarrhea, nausea and vomiting.  Genitourinary:  Negative for bladder incontinence, difficulty urinating, dysuria, frequency, hematuria and nocturia.   Musculoskeletal:  Negative for arthralgias, back pain, flank pain, myalgias and neck pain.  Skin:  Negative for itching and rash.  Neurological:  Negative for dizziness, headaches  and numbness.  Hematological:  Does not bruise/bleed easily.  Psychiatric/Behavioral:  Negative for depression, sleep disturbance and suicidal ideas. The patient is not nervous/anxious.   All other systems reviewed and are negative.    VITALS:   Blood pressure (!) 164/64, pulse (!) 56, temperature 97.8 F (36.6 C), temperature source Tympanic, resp. rate 18, height 5\' 6"  (1.676 m), weight 230 lb (104.3 kg), SpO2 97%.  Wt Readings from Last 3 Encounters:  10/15/23 230 lb (104.3 kg)  04/02/23 228 lb (103.4 kg)  03/26/23 236 lb (107 kg)    Body mass index is 37.12 kg/m.  Performance status (ECOG): 1 - Symptomatic but completely ambulatory  PHYSICAL EXAM:   Physical Exam Vitals and nursing note reviewed. Exam conducted with a chaperone present.  Constitutional:      Appearance: Normal appearance.  Cardiovascular:     Rate and Rhythm: Normal rate and regular rhythm.     Pulses: Normal pulses.     Heart sounds: Normal heart sounds.  Pulmonary:     Effort: Pulmonary effort is normal.     Breath sounds: Normal breath sounds.  Abdominal:     Palpations: Abdomen is soft. There is no hepatomegaly, splenomegaly or mass.     Tenderness: There is no abdominal tenderness.  Musculoskeletal:     Right lower leg: No edema.     Left lower leg: No edema.  Lymphadenopathy:     Cervical: No cervical adenopathy.     Right cervical: No superficial, deep or posterior cervical adenopathy.    Left cervical: No superficial, deep or posterior cervical adenopathy.     Upper Body:     Right upper body: No supraclavicular or axillary adenopathy.     Left upper body: No supraclavicular or axillary adenopathy.  Neurological:     General: No focal deficit present.     Mental Status: He is alert and oriented to person, place, and time.  Psychiatric:        Mood and Affect: Mood normal.        Behavior: Behavior  normal.  Right occipital subcutaneous nodule as well as bilateral inguinal adenopathy  palpable.  LABS:      Latest Ref Rng & Units 03/24/2023    1:53 PM 01/09/2023    2:38 PM 12/31/2022    2:33 PM  CBC  WBC 4.0 - 10.5 K/uL 8.2  6.5  6.9   Hemoglobin 13.0 - 17.0 g/dL 82.9  56.2  13.0   Hematocrit 39.0 - 52.0 % 38.8  40.5  38.1   Platelets 150 - 400 K/uL 203  241  202       Latest Ref Rng & Units 03/24/2023    1:53 PM 01/09/2023    2:38 PM 12/31/2022    2:33 PM  CMP  Glucose 70 - 99 mg/dL 865  784  696   BUN 8 - 23 mg/dL 21  23  17    Creatinine 0.61 - 1.24 mg/dL 2.95  2.84  1.32   Sodium 135 - 145 mmol/L 136  139  137   Potassium 3.5 - 5.1 mmol/L 4.2  4.6  4.2   Chloride 98 - 111 mmol/L 99  100  101   CO2 22 - 32 mmol/L 28  24  28    Calcium 8.9 - 10.3 mg/dL 9.1  9.6  9.1   Total Protein 6.5 - 8.1 g/dL 6.6  6.4  6.7   Total Bilirubin 0.3 - 1.2 mg/dL 0.6  0.3  0.7   Alkaline Phos 38 - 126 U/L 48  53  38   AST 15 - 41 U/L 20  21  29    ALT 0 - 44 U/L 21  18  22       No results found for: "CEA1", "CEA" / No results found for: "CEA1", "CEA" No results found for: "PSA1" No results found for: "GMW102" No results found for: "CAN125"  No results found for: "TOTALPROTELP", "ALBUMINELP", "A1GS", "A2GS", "BETS", "BETA2SER", "GAMS", "MSPIKE", "SPEI" Lab Results  Component Value Date   TIBC 335 11/24/2019   TIBC 284 10/25/2019   FERRITIN 220 11/24/2019   FERRITIN 236 10/25/2019   FERRITIN 318 04/17/2019   IRONPCTSAT 10 (L) 11/24/2019   IRONPCTSAT 8 (L) 10/25/2019   Lab Results  Component Value Date   LDH 125 03/24/2023   LDH 129 12/31/2022   LDH 132 07/18/2022     STUDIES:   NM PET Image Restag (PS) Skull Base To Thigh Result Date: 10/14/2023 CLINICAL DATA:  Subsequent treatment strategy for large B-cell lymphoma. EXAM: NUCLEAR MEDICINE PET SKULL BASE TO THIGH TECHNIQUE: 12.39 mCi F-18 FDG was injected intravenously. Full-ring PET imaging was performed from the skull base to thigh after the radiotracer. CT data was obtained and used for attenuation correction and  anatomic localization. Fasting blood glucose: 114 mg/dl COMPARISON:  PET-CT 72/53/6644 and older FINDINGS: Mediastinal blood pool activity: SUV max 2.9 Liver activity: SUV max 4.7 NECK: There is a focal nodule identified along the base of skull on the right side in the subcutaneous tissues abutting the musculature which appears larger. Previously this measured 11 x 8 mm with maximum SUV value of 9.8 and today maximum SUV of 17.7 and dimension on CT image 19 of 18 by 12 mm. The right-sided parotid lesion which previously had maximum SUV value of 3.2, today has maximum SUV value 6.3 and is likely a a parotid lymph node on image 25 measuring 8 x 8 mm. Previously a proximally 5 mm. Area of uptake along the left side of the face has a focal area  of nodularity anterior to the left ear is also larger with more uptake. Maximum SUV value of 7.2 and approximate dimension on CT image 12 measures by 6 mm. No new areas of abnormal uptake in the neck including elsewhere along lymph node change of the submandibular, posterior triangle or internal jugular region. Near symmetric uptake seen of the visualized intracranial compartment. Incidental CT findings: Scattered vascular calcifications identified. The submandibular glands and thyroid gland are unremarkable. There is partial opacity of several paranasal sinuses with some fluid along the maxillary sinuses. Mucosal thickening along the anterior ethmoid air cells. These changes are increased from previous. No associated abnormal uptake. CHEST: There is some areas of increased uptake identified in the mediastinum. These are new or increased from previous. Example of a new lesion is seen with maximum SUV value of 4.9 corresponding to a left paratracheal node on CT image 57 measuring 9 by 8 mm. Previously 6 mm. Focus of abnormal tissue or nodal disease immediately posterior to the descending thoracic aorta has maximum SUV value today of 9.3. Previously there is some subtle uptake with  maximum SUV value 6.5. Tissue today on image 68 measures 17 by 10 mm. Previously 15 by 5 mm. Minimal uptake along subcarinal region. Mild areas of uptake as well along some areas of tissue right paraspinal on image 70, likely additional areas of subtle disease. No clear abnormal radiotracer uptake identified in the axillary regions or hila otherwise. No abnormal uptake along the lung parenchyma. Incidental CT findings: Heart is nonenlarged. Trace pericardial fluid. Coronary artery calcifications are seen. The thoracic aorta also has a normal course and caliber with scattered calcified plaque. Left IJ chest port in place with tip extends to the SVC brachiocephalic junction. There is some breathing motion. No consolidation, pneumothorax or effusion. Minimal opacity seen in the anterior right lower lobe on image 68, similar to previous. There is also a tiny nodule just above the left hemidiaphragm on image 78 measuring 4 mm, unchanged in retrospect from previous when adjusted for slice misregistration. ABDOMEN/PELVIS: There is physiologic distribution radiotracer along the parenchymal organs, bowel and renal collecting systems. No specific abnormal uptake along the normal size spleen. Previously there is some uptake within a node along the low anterior left hemipelvis level of the bladder. Measuring maximum SUV value of 7.4 in short axis of 9 mm. Today this has maximum SUV value 5.7 and short axis on image 152 9 mm. Above the bladder in the midline anterior along the pelvis are 2 adjacent soft tissue nodules once again. The larger more midline structure which previously measured 2.3 x 1.2 cm, today on image 141 measures 2.8 by 1.4 cm. Maximum SUV value of this lesion of 8.3. The left lateral lesion to this is smaller and has more intense uptake is also slightly more prominent. There is increasing uptake within nodular tissue along the right pelvic sidewall at the level of the rectum maximum SUV value of 6.7 today  previously 2.9. Nodular tissue in this location is also thicker today in transverse dimension approaching 7 mm and previously 4 mm. In addition nodular tissue seen along the base of the penis on left side is also increased in size and uptake. Bilateral inguinal nodes also show increased slightly. Example on the right has maximum SUV value today of 18.3 and previously 15.7. Prior measurement of 3.2 x 2.3 cm and today 3.8 by 2.8 cm on CT image 170. There is also small amount of amorphous tissue which is with increased  uptake immediately anterior to the left side of the S1 vertebral body. Incidental CT findings: Diffuse vascular calcifications identified. Persistent soft tissue thickening and stranding along the central mesentery consistent with previously treated disease. No significant uptake in this particular location. Bowel is nondilated. Fatty liver infiltration. Nonspecific perinephric stranding. No renal or ureteral stones identified. Adrenal nodule once again identified which is without abnormal uptake. Known adenoma. SKELETON: No developing areas of osseous abnormal uptake. Pars defects at L5. Trace listhesis. IMPRESSION: Overall slightly increased in size level uptake of several areas of nodal tissue. These include previously seen areas of disease along neck, mediastinum, pelvis. There is a new lesion identified left paratracheal in the mediastinum. Deauville 5 Stable appearance of the mesenteric soft tissue thickening consistent with treated disease. No recurrent areas of abnormal uptake in this particular location. No splenic enlargement or splenic abnormal uptake. Electronically Signed   By: Adrianna Horde M.D.   On: 10/14/2023 10:04

## 2023-10-20 NOTE — Progress Notes (Unsigned)
 Kevin Asper, DO  [facility]; P Ir Procedure Requests OK for US  guided right posterior neck mass biopsy ("occipital")  PET 10/06/23  Previous inguinal node biopsy 08/27/22.  [name]       Previous Messages    ----- Message ----- From: [facility] Sent: 10/16/2023   9:05 AM EDT To: [facility]; Ir Procedure Requests Subject: CT US  GUIDED BIOPSY                            Procedure :CT US  GUIDED BIOPSY  Reason :biopsy of right occipital mass; hx of lymphoma, Diffuse large B-cell lymphoma of lymph nodes of multiple regions,Occipital mass  History :NM PET Image Restag (PS) Skull Base To Thigh,NM PET Image Restag (PS) Skull Base To Thigh,  Provider:Katragadda, Tereasa Felty, MD  Provider contact :[phone]

## 2023-10-21 NOTE — Progress Notes (Unsigned)
 Myrlene Asper, DO  [facility] Every biopsy patient gets a chance for mod sed if they want.  There is no need to ask follow up questions about this.  Every patient can be asked.  That has never changed. If the patient defers that is fine.  JW       Previous Messages    ----- Message ----- From: [facility] Sent: 10/20/2023   5:11 PM EDT To: Myrlene Asper, DO Subject: RE: CT US  GUIDED BIOPSY                        Dr. [name],   Local anesthetic or moderate sedation? ----- Message ----- From: Myrlene Asper, DO Sent: 10/20/2023   3:45 PM EDT To: [facility]; Ir Procedure Requests Subject: RE: CT US  GUIDED BIOPSY                        OK for US  guided right posterior neck mass biopsy ("occipital")  PET 10/06/23  Previous inguinal node biopsy 08/27/22.  [name] ----- Message ----- From: [facility] Sent: 10/16/2023   9:05 AM EDT To: [facility]; Ir Procedure Requests Subject: CT US  GUIDED BIOPSY                            Procedure :CT US  GUIDED BIOPSY  Reason :biopsy of right occipital mass; hx of lymphoma, Diffuse large B-cell lymphoma of lymph nodes of multiple regions,Occipital mass  History :NM PET Image Restag (PS) Skull Base To Thigh,NM PET Image Restag (PS) Skull Base To Thigh,  Provider:Katragadda, Tereasa Felty, MD  Provider contact [phone]

## 2023-10-27 NOTE — Progress Notes (Signed)
 This encounter was created in error - please disregard.

## 2023-10-29 ENCOUNTER — Other Ambulatory Visit: Payer: Self-pay | Admitting: Family Medicine

## 2023-10-29 DIAGNOSIS — F419 Anxiety disorder, unspecified: Secondary | ICD-10-CM

## 2023-10-29 DIAGNOSIS — I10 Essential (primary) hypertension: Secondary | ICD-10-CM

## 2023-10-30 ENCOUNTER — Other Ambulatory Visit: Payer: Self-pay | Admitting: Family Medicine

## 2023-10-30 ENCOUNTER — Encounter: Payer: Self-pay | Admitting: Family Medicine

## 2023-10-30 DIAGNOSIS — I10 Essential (primary) hypertension: Secondary | ICD-10-CM

## 2023-10-30 NOTE — Telephone Encounter (Signed)
 tiffany pt NTBS 30-d given 10/06/23

## 2023-10-30 NOTE — Telephone Encounter (Signed)
 Tiffany pt NTBS 30-d given 10/06/23

## 2023-10-30 NOTE — Telephone Encounter (Signed)
NA letter mailed

## 2023-11-02 ENCOUNTER — Other Ambulatory Visit: Payer: Self-pay | Admitting: Family Medicine

## 2023-11-02 DIAGNOSIS — I10 Essential (primary) hypertension: Secondary | ICD-10-CM

## 2023-11-02 DIAGNOSIS — F419 Anxiety disorder, unspecified: Secondary | ICD-10-CM

## 2023-11-03 NOTE — Telephone Encounter (Signed)
 Needs to be seen

## 2023-11-04 ENCOUNTER — Encounter: Payer: Self-pay | Admitting: Family Medicine

## 2023-11-04 ENCOUNTER — Ambulatory Visit (INDEPENDENT_AMBULATORY_CARE_PROVIDER_SITE_OTHER): Admitting: Family Medicine

## 2023-11-04 VITALS — BP 132/74 | HR 54 | Temp 98.5°F | Ht 66.0 in | Wt 233.2 lb

## 2023-11-04 DIAGNOSIS — E781 Pure hyperglyceridemia: Secondary | ICD-10-CM | POA: Diagnosis not present

## 2023-11-04 DIAGNOSIS — I10 Essential (primary) hypertension: Secondary | ICD-10-CM | POA: Diagnosis not present

## 2023-11-04 DIAGNOSIS — C8338 Diffuse large B-cell lymphoma, lymph nodes of multiple sites: Secondary | ICD-10-CM | POA: Diagnosis not present

## 2023-11-04 DIAGNOSIS — E559 Vitamin D deficiency, unspecified: Secondary | ICD-10-CM | POA: Insufficient documentation

## 2023-11-04 DIAGNOSIS — F419 Anxiety disorder, unspecified: Secondary | ICD-10-CM | POA: Diagnosis not present

## 2023-11-04 DIAGNOSIS — I272 Pulmonary hypertension, unspecified: Secondary | ICD-10-CM

## 2023-11-04 DIAGNOSIS — R252 Cramp and spasm: Secondary | ICD-10-CM

## 2023-11-04 DIAGNOSIS — Z6837 Body mass index (BMI) 37.0-37.9, adult: Secondary | ICD-10-CM | POA: Insufficient documentation

## 2023-11-04 LAB — BAYER DCA HB A1C WAIVED: HB A1C (BAYER DCA - WAIVED): 5.3 % (ref 4.8–5.6)

## 2023-11-04 MED ORDER — AMLODIPINE BESYLATE 10 MG PO TABS
10.0000 mg | ORAL_TABLET | Freq: Every day | ORAL | 1 refills | Status: DC
Start: 2023-11-04 — End: 2024-04-08

## 2023-11-04 MED ORDER — LOSARTAN POTASSIUM-HCTZ 100-25 MG PO TABS
1.0000 | ORAL_TABLET | Freq: Every day | ORAL | 1 refills | Status: DC
Start: 2023-11-04 — End: 2024-04-08

## 2023-11-04 MED ORDER — ESCITALOPRAM OXALATE 10 MG PO TABS: 10.0000 mg | ORAL_TABLET | Freq: Every day | ORAL | 1 refills | Status: DC

## 2023-11-04 NOTE — Progress Notes (Signed)
 Subjective:  Patient ID: Kevin [name], male    DOB: 1956/01/19, 68 y.o.   MRN: [id number]  Patient Care Team: Galvin Jules, FNP as PCP - General (Family Medicine) Riley Cheadle Windsor Hatcher, MD as Consulting Physician (Gastroenterology) Paulett Boros, MD as Consulting Physician (Hematology)   Chief Complaint:  Medical Management of Chronic Issues   HPI: Kevin [name] is a 68 y.o. male presenting on 11/04/2023 for Medical Management of Chronic Issues   Kevin [name] "[name]" is a 68 year old male with hypertension and pulmonary hypertension who presents for routine follow-up.  He has no new symptoms and his blood pressure is well-controlled at home, typically around 120/70 mmHg, although it tends to rise when visiting medical facilities. He continues to take Norvasc  (amlodipine ), metoprolol , and losartan  hydrochlorothiazide  for blood pressure management.  He is under regular oncology follow-up every six months and has a planned biopsy on a spot and the back of his neck scheduled for the 20th of this month. These areas are being monitored closely.  He continues to take fenofibrate  (Tricor ) for triglyceride management without issues, having previously switched from fish oil to fenofibrate .  He is on Lexapro  for anxiety, which is working well, and takes vitamin D , initially prescribed as a once-a-week therapy due to low levels.  He has a history of pulmonary hypertension, but recent echocardiograms have shown no significant issues. No shortness of breath, dizziness, or fatigue.  He experiences cramps regularly, affecting his fingers and legs, but not daily. He has not been taking any medication for these cramps. He also reports having 'stupid dreams' during naps, which he does not find concerning.  He mentions occasional snoring, confirmed by his wife, but does not report any significant sleep disturbances.  No changes in vision or hearing, although he acknowledges needing new  glasses. No swelling in his legs or feet, shortness of breath, dizziness, fatigue, changes in vision, or ringing in the ears.         11/04/2023   10:03 AM 01/09/2023   11:40 AM 08/29/2022    3:31 PM 04/30/2022    9:41 AM 02/26/2022    3:52 PM  Depression screen PHQ 2/9  Decreased Interest 0 0 0 0 0  Down, Depressed, Hopeless 0 0 0 0 0  PHQ - 2 Score 0 0 0 0 0  Altered sleeping 0 0 0 0 0  Tired, decreased energy 0 0 0 0 0  Change in appetite 0 0 0 0 0  Feeling bad or failure about yourself  0 0 0 0 0  Trouble concentrating 0 0 0 0 0  Moving slowly or fidgety/restless 0 0 0 0 0  Suicidal thoughts 0 0 0 0 0  PHQ-9 Score 0 0 0 0 0  Difficult doing work/chores Not difficult at all Not difficult at all Not difficult at all Not difficult at all Not difficult at all      11/04/2023   10:03 AM 01/09/2023   11:40 AM 08/29/2022    3:32 PM 04/30/2022    9:41 AM  GAD 7 : Generalized Anxiety Score  Nervous, Anxious, on Edge 0 0 0 0  Control/stop worrying 0 0 0 0  Worry too much - different things 0 0 0 0  Trouble relaxing 0 0 0 0  Restless 0 0 0 0  Easily annoyed or irritable 0 0 0 0  Afraid - awful might happen 0 0 0 0  Total GAD 7 Score  0 0 0 0  Anxiety Difficulty Not difficult at all Not difficult at all Not difficult at all Not difficult at all     Relevant past medical, surgical, family, and social history reviewed and updated as indicated.  Allergies and medications reviewed and updated. Data reviewed: Chart in Epic.   Past Medical History:  Diagnosis Date   Anxiety    Depression    Diffuse large B cell lymphoma (HCC) 04/26/2019   Hyperlipidemia 02/01/2014   Hypertension    Leg DVT (deep venous thromboembolism), acute, bilateral (HCC) 04/18/2019   Port-A-Cath in place 04/27/2019    Past Surgical History:  Procedure Laterality Date   COLONOSCOPY WITH PROPOFOL  N/A 12/25/2020   Procedure: COLONOSCOPY WITH PROPOFOL ;  Surgeon: Suzette Espy, MD;  Location: [facility];   Service: Endoscopy;  Laterality: N/A;  2:15pm   CYSTOSCOPY W/ URETERAL STENT PLACEMENT Bilateral 04/21/2019   Procedure: CYSTOSCOPY WITH RETROGRADE PYELOGRAM/URETERAL STENT PLACEMENT;  Surgeon: Marco Severs, MD;  Location: [facility];  Service: Urology;  Laterality: Bilateral;   ETHMOIDECTOMY Right 02/04/2020   Procedure: ETHMOIDECTOMY;  Surgeon: Reynold Caves, MD;  Location: [facility];  Service: ENT;  Laterality: Right;   INGUINAL LYMPH NODE BIOPSY Right 04/21/2019   Procedure: INGUINAL LYMPH NODE BIOPSY;  Surgeon: Awilda Bogus, MD;  Location: [facility];  Service: General;  Laterality: Right;   MAXILLARY ANTROSTOMY Right 02/04/2020   Procedure: MAXILLARY ANTROSTOMY WITH TISSUE REMOVAL;  Surgeon: Reynold Caves, MD;  Location: [facility];  Service: ENT;  Laterality: Right;   PORTACATH PLACEMENT Left 05/07/2019   Procedure: INSERTION PORT-A-CATH (catheter left subclavian);  Surgeon: Awilda Bogus, MD;  Location: [facility];  Service: General;  Laterality: Left;    Social History   Socioeconomic History   Marital status: Married    Spouse name: Not on file   Number of children: 3   Years of education: 10   Highest education level: Not on file  Occupational History   Not on file  Tobacco Use   Smoking status: Former    Current packs/day: 0.00    Average packs/day: 3.0 packs/day for 30.0 years (90.0 ttl pk-yrs)    Types: Cigarettes    Start date: 04/26/1969    Quit date: 04/27/1999    Years since quitting: 24.5   Smokeless tobacco: Never  Vaping Use   Vaping status: Never Used  Substance and Sexual Activity   Alcohol use: Yes    Comment: daily. 2-6 beer daily   Drug use: Never   Sexual activity: Yes  Other Topics Concern   Not on file  Social History Narrative   Not on file   Social Drivers of Health   Financial Resource Strain: Low Risk  (10/22/2022)   Overall Financial Resource Strain (CARDIA)    Difficulty of Paying Living Expenses: Not hard at all   Food Insecurity: No Food Insecurity (10/22/2022)   Hunger Vital Sign    Worried About Running Out of Food in the Last Year: Never true    Ran Out of Food in the Last Year: Never true  Transportation Needs: No Transportation Needs (10/22/2022)   PRAPARE - Administrator, Civil Service (Medical): No    Lack of Transportation (Non-Medical): No  Physical Activity: Sufficiently Active (10/22/2022)   Exercise Vital Sign    Days of Exercise per Week: 5 days    Minutes of Exercise per Session: 30 min  Stress: No Stress Concern Present (10/22/2022)  [employer] of Occupational Health - Occupational Stress Questionnaire    Feeling of Stress : Not at all  Social Connections: Moderately Isolated (10/22/2022)   Social Connection and Isolation Panel [NHANES]    Frequency of Communication with Friends and Family: More than three times a week    Frequency of Social Gatherings with Friends and Family: More than three times a week    Attends Religious Services: Never    Database administrator or Organizations: No    Attends Banker Meetings: Never    Marital Status: Married  Catering manager Violence: Not At Risk (10/22/2022)   Humiliation, Afraid, Rape, and Kick questionnaire    Fear of Current or Ex-Partner: No    Emotionally Abused: No    Physically Abused: No    Sexually Abused: No    Outpatient Encounter Medications as of 11/04/2023  Medication Sig   aspirin EC 81 MG tablet Take 81 mg by mouth daily. Swallow whole.   fenofibrate  (TRICOR ) 145 MG tablet Take 1 tablet (145 mg total) by mouth daily.   fluticasone  (FLONASE ) 50 MCG/ACT nasal spray Place 2 sprays into both nostrils daily.   LORazepam  (ATIVAN ) 0.5 MG tablet TAKE 1 TABLET (0.5 MG TOTAL) BY MOUTH EVERY 8 (EIGHT) HOURS. (Patient taking differently: Take 0.5 mg by mouth every 8 (eight) hours as needed for anxiety.)   metoprolol  succinate (TOPROL -XL) 25 MG 24 hr tablet Take 1 tablet (25 mg total) by mouth daily.    Vitamin D , Ergocalciferol , (DRISDOL ) 1.25 MG (50000 UNIT) CAPS capsule Take 1 capsule (50,000 Units total) by mouth every 7 (seven) days.   amLODipine  (NORVASC ) 10 MG tablet Take 1 tablet (10 mg total) by mouth daily.   escitalopram  (LEXAPRO ) 10 MG tablet Take 1 tablet (10 mg total) by mouth daily.   losartan -hydrochlorothiazide  (HYZAAR) 100-25 MG tablet Take 1 tablet by mouth daily.   [DISCONTINUED] amLODipine  (NORVASC ) 10 MG tablet Take 1 tablet (10 mg total) by mouth daily. **NEEDS TO BE SEEN BEFORE NEXT REFILL**   [DISCONTINUED] escitalopram  (LEXAPRO ) 10 MG tablet Take 1 tablet (10 mg total) by mouth daily. **NEEDS TO BE SEEN BEFORE NEXT REFILL**   [DISCONTINUED] losartan -hydrochlorothiazide  (HYZAAR) 100-25 MG tablet Take 1 tablet by mouth daily. **NEEDS TO BE SEEN BEFORE NEXT REFILL**   Facility-Administered Encounter Medications as of 11/04/2023  Medication   0.9 %  sodium chloride  infusion   sodium chloride  flush (NS) 0.9 % injection 10 mL   sodium chloride  flush (NS) 0.9 % injection 10 mL    No Known Allergies  Pertinent ROS per HPI, otherwise unremarkable      Objective:  BP 132/74   Pulse (!) 54   Temp 98.5 F (36.9 C)   Ht 5\' 6"  (1.676 m)   Wt 233 lb 3.2 oz (105.8 kg)   SpO2 94%   BMI 37.64 kg/m    Wt Readings from Last 3 Encounters:  11/04/23 233 lb 3.2 oz (105.8 kg)  10/15/23 230 lb (104.3 kg)  04/02/23 228 lb (103.4 kg)    Physical Exam Vitals and nursing note reviewed.  Constitutional:      Appearance: Normal appearance. He is well-developed and well-groomed. He is morbidly obese.  HENT:     Head: Normocephalic and atraumatic.     Mouth/Throat:     Mouth: Mucous membranes are moist.  Eyes:     Pupils: Pupils are equal, round, and reactive to light.  Cardiovascular:     Rate and Rhythm: Normal rate and  regular rhythm.     Heart sounds: Murmur heard.     Systolic murmur is present with a grade of 2/6.  Pulmonary:     Effort: Pulmonary effort is  normal.     Breath sounds: Normal breath sounds.  Chest:    Musculoskeletal:     Cervical back: Neck supple.  Skin:    General: Skin is warm and dry.     Capillary Refill: Capillary refill takes less than 2 seconds.  Neurological:     General: No focal deficit present.     Mental Status: He is alert and oriented to person, place, and time.  Psychiatric:        Mood and Affect: Mood normal.        Behavior: Behavior normal. Behavior is cooperative.        Thought Content: Thought content normal.        Judgment: Judgment normal.     Results for orders placed or performed in visit on 10/15/23  Lactate dehydrogenase   Collection Time: 10/15/23  3:30 PM  Result Value Ref Range   LDH 130 98 - 192 U/L  Comprehensive metabolic panel   Collection Time: 10/15/23  3:30 PM  Result Value Ref Range   Sodium 137 135 - 145 mmol/L   Potassium 4.1 3.5 - 5.1 mmol/L   Chloride 96 (L) 98 - 111 mmol/L   CO2 28 22 - 32 mmol/L   Glucose, Bld 133 (H) 70 - 99 mg/dL   BUN 20 8 - 23 mg/dL   Creatinine, Ser 0.98 0.61 - 1.24 mg/dL   Calcium 9.7 8.9 - 11.9 mg/dL   Total Protein 7.0 6.5 - 8.1 g/dL   Albumin  4.2 3.5 - 5.0 g/dL   AST 28 15 - 41 U/L   ALT 21 0 - 44 U/L   Alkaline Phosphatase 44 38 - 126 U/L   Total Bilirubin 0.6 0.0 - 1.2 mg/dL   GFR, Estimated >14 >78 mL/min   Anion gap 13 5 - 15  CBC with Differential   Collection Time: 10/15/23  3:30 PM  Result Value Ref Range   WBC 7.2 4.0 - 10.5 K/uL   RBC 4.17 (L) 4.22 - 5.81 MIL/uL   Hemoglobin 13.3 13.0 - 17.0 g/dL   HCT 29.5 (L) 62.1 - 30.8 %   MCV 93.3 80.0 - 100.0 fL   MCH 31.9 26.0 - 34.0 pg   MCHC 34.2 30.0 - 36.0 g/dL   RDW 65.7 84.6 - 96.2 %   Platelets 273 150 - 400 K/uL   nRBC 0.0 0.0 - 0.2 %   Neutrophils Relative % 66 %   Neutro Abs 4.8 1.7 - 7.7 K/uL   Lymphocytes Relative 17 %   Lymphs Abs 1.3 0.7 - 4.0 K/uL   Monocytes Relative 9 %   Monocytes Absolute 0.7 0.1 - 1.0 K/uL   Eosinophils Relative 6 %   Eosinophils  Absolute 0.4 0.0 - 0.5 K/uL   Basophils Relative 1 %   Basophils Absolute 0.1 0.0 - 0.1 K/uL   Immature Granulocytes 1 %   Abs Immature Granulocytes 0.06 0.00 - 0.07 K/uL       Pertinent labs & imaging results that were available during my care of the patient were reviewed by me and considered in my medical decision making.  Assessment & Plan:  Ami Kumagai" was seen today for medical management of chronic issues.  Diagnoses and all orders for this visit:  Essential hypertension -  losartan -hydrochlorothiazide  (HYZAAR) 100-25 MG tablet; Take 1 tablet by mouth daily. -     amLODipine  (NORVASC ) 10 MG tablet; Take 1 tablet (10 mg total) by mouth daily. -     CBC with Differential/Platelet -     CMP14+EGFR  Anxiety -     escitalopram  (LEXAPRO ) 10 MG tablet; Take 1 tablet (10 mg total) by mouth daily. -     CBC with Differential/Platelet -     CMP14+EGFR -     Thyroid  Panel With TSH  Hypertriglyceridemia -     CMP14+EGFR -     Lipid panel  Vitamin D  deficiency -     CMP14+EGFR -     VITAMIN D  25 Hydroxy (Vit-D Deficiency, Fractures)  Diffuse large B-cell lymphoma of lymph nodes of multiple regions (HCC) -     CBC with Differential/Platelet  Obesity, morbid (HCC) -     CBC with Differential/Platelet -     CMP14+EGFR -     Lipid panel -     Thyroid  Panel With TSH -     VITAMIN D  25 Hydroxy (Vit-D Deficiency, Fractures) -     Bayer DCA Hb A1c Waived  Muscle cramps -     CBC with Differential/Platelet -     CMP14+EGFR -     Magnesium   Pulmonary hypertension (HCC) -     CBC with Differential/Platelet      Wellness Visit Routine wellness visit with no new complaints or medication changes. Blood pressure is well-controlled at home but elevated in clinical settings, likely due to white coat syndrome. - Recheck vitamin D  levels today. - Update blood work, including potassium, magnesium , and calcium levels. - Monitor blood pressure at home and keep a log. Report if  consistently above 130/80 mmHg.  Hypertension Hypertension is well-controlled at home with amlodipine , metoprolol , and losartan  hydrochlorothiazide . Blood pressure elevation in clinical settings is likely due to white coat syndrome. - Continue current antihypertensive medications. - Monitor blood pressure at home and report if consistently elevated.  Muscle cramps Intermittent muscle cramps, possibly related to low potassium or magnesium  levels due to losartan  hydrochlorothiazide  use. - Check potassium and magnesium  levels today. - Start magnesium  supplementation at night to help with cramps.  Hyperlipidemia Hyperlipidemia managed with fenofibrate . Intermittent muscle cramps possibly related to medication or electrolyte imbalance. - Continue fenofibrate  for hyperlipidemia management. - Check potassium and magnesium  levels today.  Vitamin D  deficiency Vitamin D  deficiency with current supplementation, though adherence is uncertain. - Recheck vitamin D  levels today.  Anxiety Anxiety is well-managed with Lexapro . - Continue Lexapro  for anxiety management.  Pulmonary hypertension Pulmonary hypertension with previous echocardiogram showing mild increased pressure but no significant issues. No current symptoms of shortness of breath, dizziness, or fatigue. - Monitor for symptoms of pulmonary hypertension.          Continue all other maintenance medications.  Follow up plan: Return in about 6 months (around 05/06/2024), or if symptoms worsen or fail to improve, for HTN.   Continue healthy lifestyle choices, including diet (rich in fruits, vegetables, and lean proteins, and low in salt and simple carbohydrates) and exercise (at least 30 minutes of moderate physical activity daily).  Educational handout given for DASH, HTN  The above assessment and management plan was discussed with the patient. The patient verbalized understanding of and has agreed to the management plan. Patient is  aware to call the clinic if they develop any new symptoms or if symptoms persist or worsen. Patient is aware when to  return to the clinic for a follow-up visit. Patient educated on when it is appropriate to go to the emergency department.   Kattie Parrot, FNP-C [facility] [phone]

## 2023-11-04 NOTE — Patient Instructions (Signed)
 Goal BP:  For patients younger than 60: Goal BP < 140/90. For patients 60 and older: Goal BP < 150/90. For patients with diabetes: Goal BP < 140/90.  Take your medications faithfully as prescribed. Maintain a healthy weight. Get at least 150 minutes of aerobic exercise per week. Minimize salt intake, less than 2000 mg per day. Minimize alcohol intake.  DASH Eating Plan DASH stands for "Dietary Approaches to Stop Hypertension." The DASH eating plan is a healthy eating plan that has been shown to reduce high blood pressure (hypertension). Additional health benefits may include reducing the risk of type 2 diabetes mellitus, heart disease, and stroke. The DASH eating plan may also help with weight loss.  WHAT DO I NEED TO KNOW ABOUT THE DASH EATING PLAN? For the DASH eating plan, you will follow these general guidelines: Choose foods with a percent daily value for sodium of less than 5% (as listed on the food label). Use salt-free seasonings or herbs instead of table salt or sea salt. Check with your health care provider or pharmacist before using salt substitutes. Eat lower-sodium products, often labeled as "lower sodium" or "no salt added." Eat fresh foods. Eat more vegetables, fruits, and low-fat dairy products. Choose whole grains. Look for the word "whole" as the first word in the ingredient list. Choose fish and skinless chicken or [place] more often than red meat. Limit fish, poultry, and meat to 6 oz (170 g) each day. Limit sweets, desserts, sugars, and sugary drinks. Choose heart-healthy fats. Limit cheese to 1 oz (28 g) per day. Eat more home-cooked food and less restaurant, buffet, and fast food. Limit fried foods. Cook foods using methods other than frying. Limit canned vegetables. If you do use them, rinse them well to decrease the sodium. When eating at a restaurant, ask that your food be prepared with less salt, or no salt if possible.  WHAT FOODS CAN I EAT? Seek help from  a dietitian for individual calorie needs.  Grains Whole grain or whole wheat bread. Brown rice. Whole grain or whole wheat pasta. Quinoa, bulgur, and whole grain cereals. Low-sodium cereals. Corn or whole wheat flour tortillas. Whole grain cornbread. Whole grain crackers. Low-sodium crackers.  Vegetables Fresh or frozen vegetables (raw, steamed, roasted, or grilled). Low-sodium or reduced-sodium tomato and vegetable juices. Low-sodium or reduced-sodium tomato sauce and paste. Low-sodium or reduced-sodium canned vegetables.   Fruits All fresh, canned (in natural juice), or frozen fruits.  Meat and Other Protein Products Ground beef (85% or leaner), grass-fed beef, or beef trimmed of fat. Skinless chicken or [place]. Ground chicken or [place]. Pork trimmed of fat. All fish and seafood. Eggs. Dried beans, peas, or lentils. Unsalted nuts and seeds. Unsalted canned beans.  Dairy Low-fat dairy products, such as skim or 1% milk, 2% or reduced-fat cheeses, low-fat ricotta or cottage cheese, or plain low-fat yogurt. Low-sodium or reduced-sodium cheeses.  Fats and Oils Tub margarines without trans fats. Light or reduced-fat mayonnaise and salad dressings (reduced sodium). Avocado. Safflower, olive, or canola oils. Natural peanut or almond butter.  Other Unsalted popcorn and pretzels. The items listed above may not be a complete list of recommended foods or beverages. Contact your dietitian for more options.  WHAT FOODS ARE NOT RECOMMENDED?  Grains White bread. White pasta. White rice. Refined cornbread. Bagels and croissants. Crackers that contain trans fat.  Vegetables Creamed or fried vegetables. Vegetables in a cheese sauce. Regular canned vegetables. Regular canned tomato sauce and paste. Regular tomato and vegetable juices.  Fruits Dried fruits. Canned fruit in light or heavy syrup. Fruit juice.  Meat and Other Protein Products Fatty cuts of meat. Ribs, chicken wings, bacon, sausage,  bologna, salami, chitterlings, fatback, hot dogs, bratwurst, and packaged luncheon meats. Salted nuts and seeds. Canned beans with salt.  Dairy Whole or 2% milk, cream, half-and-half, and cream cheese. Whole-fat or sweetened yogurt. Full-fat cheeses or blue cheese. Nondairy creamers and whipped toppings. Processed cheese, cheese spreads, or cheese curds.  Condiments Onion and garlic salt, seasoned salt, table salt, and sea salt. Canned and packaged gravies. Worcestershire sauce. Tartar sauce. Barbecue sauce. Teriyaki sauce. Soy sauce, including reduced sodium. Steak sauce. Fish sauce. Oyster sauce. Cocktail sauce. Horseradish. Ketchup and mustard. Meat flavorings and tenderizers. Bouillon cubes. Hot sauce. Tabasco sauce. Marinades. Taco seasonings. Relishes.  Fats and Oils Butter, stick margarine, lard, shortening, ghee, and bacon fat. Coconut, palm kernel, or palm oils. Regular salad dressings.  Other Pickles and olives. Salted popcorn and pretzels.  The items listed above may not be a complete list of foods and beverages to avoid. Contact your dietitian for more information.  WHERE CAN I FIND MORE INFORMATION? National Heart, [employer], and [employer] Institute: [email or web address] Document Released: 06/06/2011 Document Revised: 11/01/2013 Document Reviewed: 04/21/2013 [facility] Patient Information 2015 [place], [place]. This information is not intended to replace advice given to you by your health care provider. Make sure you discuss any questions you have with your health care provider.   I think that you would greatly benefit from seeing a nutritionist.  If you are interested, please call Dr. Gerilyn Pilgrim at [phone] to schedule an appointment.

## 2023-11-05 LAB — LIPID PANEL
Chol/HDL Ratio: 3.4 ratio (ref 0.0–5.0)
Cholesterol, Total: 160 mg/dL (ref 100–199)
HDL: 47 mg/dL (ref >39)
LDL Chol Calc (NIH): 85 mg/dL (ref 0–99)
Triglycerides: 161 mg/dL — ABNORMAL HIGH (ref 0–149)
VLDL Cholesterol Cal: 28 mg/dL (ref 5–40)

## 2023-11-05 LAB — CBC WITH DIFFERENTIAL/PLATELET
Basophils Absolute: 0.1 10*3/uL (ref 0.0–0.2)
Basos: 1 %
EOS (ABSOLUTE): 0.4 10*3/uL (ref 0.0–0.4)
Eos: 6 %
Hematocrit: 40.7 % (ref 37.5–51.0)
Hemoglobin: 13.5 g/dL (ref 13.0–17.7)
Immature Grans (Abs): 0.1 10*3/uL (ref 0.0–0.1)
Immature Granulocytes: 2 %
Lymphocytes Absolute: 1 10*3/uL (ref 0.7–3.1)
Lymphs: 16 %
MCH: 31.3 pg (ref 26.6–33.0)
MCHC: 33.2 g/dL (ref 31.5–35.7)
MCV: 94 fL (ref 79–97)
Monocytes Absolute: 0.8 10*3/uL (ref 0.1–0.9)
Monocytes: 11 %
Neutrophils Absolute: 4.3 10*3/uL (ref 1.4–7.0)
Neutrophils: 64 %
Platelets: 236 10*3/uL (ref 150–450)
RBC: 4.31 x10E6/uL (ref 4.14–5.80)
RDW: 11.7 % (ref 11.6–15.4)
WBC: 6.7 10*3/uL (ref 3.4–10.8)

## 2023-11-05 LAB — MAGNESIUM: Magnesium: 2.1 mg/dL (ref 1.6–2.3)

## 2023-11-05 LAB — CMP14+EGFR
ALT: 13 IU/L (ref 0–44)
AST: 17 IU/L (ref 0–40)
Albumin: 4.3 g/dL (ref 3.9–4.9)
Alkaline Phosphatase: 56 IU/L (ref 44–121)
BUN/Creatinine Ratio: 20 (ref 10–24)
BUN: 20 mg/dL (ref 8–27)
Bilirubin Total: 0.3 mg/dL (ref 0.0–1.2)
CO2: 27 mmol/L (ref 20–29)
Calcium: 9.6 mg/dL (ref 8.6–10.2)
Chloride: 100 mmol/L (ref 96–106)
Creatinine, Ser: 0.98 mg/dL (ref 0.76–1.27)
Globulin, Total: 2 g/dL (ref 1.5–4.5)
Glucose: 108 mg/dL — ABNORMAL HIGH (ref 70–99)
Potassium: 5 mmol/L (ref 3.5–5.2)
Sodium: 140 mmol/L (ref 134–144)
Total Protein: 6.3 g/dL (ref 6.0–8.5)
eGFR: 83 mL/min/{1.73_m2} (ref >59)

## 2023-11-05 LAB — THYROID PANEL WITH TSH
Free Thyroxine Index: 1.4 (ref 1.2–4.9)
T3 Uptake Ratio: 22 % — ABNORMAL LOW (ref 24–39)
T4, Total: 6.3 ug/dL (ref 4.5–12.0)
TSH: 2.44 u[IU]/mL (ref 0.450–4.500)

## 2023-11-05 LAB — VITAMIN D 25 HYDROXY (VIT D DEFICIENCY, FRACTURES): Vit D, 25-Hydroxy: 9 ng/mL — ABNORMAL LOW (ref 30.0–100.0)

## 2023-11-05 MED ORDER — VITAMIN D (ERGOCALCIFEROL) 1.25 MG (50000 UNIT) PO CAPS: 50000.0000 [IU] | ORAL_CAPSULE | ORAL | 6 refills | Status: AC

## 2023-11-05 NOTE — Addendum Note (Signed)
 Addended by: Galvin Jules on: 11/05/2023 07:48 AM   Modules accepted: Orders

## 2023-11-11 ENCOUNTER — Ambulatory Visit: Payer: Self-pay

## 2023-11-12 ENCOUNTER — Other Ambulatory Visit: Payer: Self-pay | Admitting: *Deleted

## 2023-11-13 ENCOUNTER — Encounter: Payer: Self-pay | Admitting: *Deleted

## 2023-11-17 ENCOUNTER — Inpatient Hospital Stay: Admitting: Hematology

## 2023-11-17 ENCOUNTER — Other Ambulatory Visit: Payer: Self-pay | Admitting: Radiology

## 2023-11-17 DIAGNOSIS — R220 Localized swelling, mass and lump, head: Secondary | ICD-10-CM

## 2023-11-17 NOTE — H&P (Addendum)
 Chief Complaint: Patient was seen in consultation today for a right occipital lesion in the setting of B cell lymphoma, with consideration for biopsy.  Referring Provider(s): Dr. Paulett Boros, MD   Supervising Physician: Creasie Doctor  Patient Status: [facility] - Out-pt  Patient is Full Code  History of Present Illness: Kevin [name] is a 68 y.o. male  with PMHx notable for HTN, HLD, diffuse large B cell lymphoma, Port-A-Cath in place, anxiety, and depression.  Per Dr. Milburn Aliment progress note on 4/16: " Double hit lymphoma: - He denies any fevers, night sweats or weight loss. - No infections in the preceding 6 months.  He is working full-time. - PET scan (10/06/2023): Overall slightly increased in size of several nodal tissue, predominantly in the right occipital subcutaneous nodule.  It is easily palpable.  I have reviewed images of the PET scan and compared it with previous scans. - I have recommended ultrasound-guided biopsy and follow-up after that. - He forgot to do labs last week.  Will do labs today. - Differential includes indolent follicular lymphoma which was in the background of DLBCL at the time of diagnosis."   Interventional Radiology was requested for biopsy of right occipital lesion. Patient is scheduled for same in IR today.   Patient is alert, calm.  Patient is currently without any significant complaints.  Patient denies any fevers, headache, chest pain, SOB, cough, abdominal pain, nausea, vomiting or bleeding.     Past Medical History:  Diagnosis Date   Anxiety    Depression    Diffuse large B cell lymphoma (HCC) 04/26/2019   Hyperlipidemia 02/01/2014   Hypertension    Leg DVT (deep venous thromboembolism), acute, bilateral (HCC) 04/18/2019   Port-A-Cath in place 04/27/2019    Past Surgical History:  Procedure Laterality Date   COLONOSCOPY WITH PROPOFOL  N/A 12/25/2020   Procedure: COLONOSCOPY WITH PROPOFOL ;  Surgeon: Suzette Espy, MD;   Location: [facility];  Service: Endoscopy;  Laterality: N/A;  2:15pm   CYSTOSCOPY W/ URETERAL STENT PLACEMENT Bilateral 04/21/2019   Procedure: CYSTOSCOPY WITH RETROGRADE PYELOGRAM/URETERAL STENT PLACEMENT;  Surgeon: Marco Severs, MD;  Location: [facility];  Service: Urology;  Laterality: Bilateral;   ETHMOIDECTOMY Right 02/04/2020   Procedure: ETHMOIDECTOMY;  Surgeon: Reynold Caves, MD;  Location: [facility];  Service: ENT;  Laterality: Right;   INGUINAL LYMPH NODE BIOPSY Right 04/21/2019   Procedure: INGUINAL LYMPH NODE BIOPSY;  Surgeon: Awilda Bogus, MD;  Location: [facility];  Service: General;  Laterality: Right;   MAXILLARY ANTROSTOMY Right 02/04/2020   Procedure: MAXILLARY ANTROSTOMY WITH TISSUE REMOVAL;  Surgeon: Reynold Caves, MD;  Location: [facility];  Service: ENT;  Laterality: Right;   PORTACATH PLACEMENT Left 05/07/2019   Procedure: INSERTION PORT-A-CATH (catheter left subclavian);  Surgeon: Awilda Bogus, MD;  Location: [facility];  Service: General;  Laterality: Left;    Allergies: Patient has no known allergies.  Medications: Prior to Admission medications   Medication Sig Start Date End Date Taking? Authorizing Provider  amLODipine  (NORVASC ) 10 MG tablet Take 1 tablet (10 mg total) by mouth daily. 11/04/23   Galvin Jules, FNP  aspirin EC 81 MG tablet Take 81 mg by mouth daily. Swallow whole.    [provider]  escitalopram  (LEXAPRO ) 10 MG tablet Take 1 tablet (10 mg total) by mouth daily. 11/04/23   Galvin Jules, FNP  fenofibrate  (TRICOR ) 145 MG tablet Take 1 tablet (145 mg total) by mouth daily.  09/12/23   Galvin Jules, FNP  fluticasone  (FLONASE ) 50 MCG/ACT nasal spray Place 2 sprays into both nostrils daily. 04/30/22   Galvin Jules, FNP  LORazepam  (ATIVAN ) 0.5 MG tablet TAKE 1 TABLET (0.5 MG TOTAL) BY MOUTH EVERY 8 (EIGHT) HOURS. Patient taking differently: Take 0.5 mg by mouth every 8 (eight) hours as needed for anxiety. 03/13/20    Paulett Boros, MD  losartan -hydrochlorothiazide  (HYZAAR) 100-25 MG tablet Take 1 tablet by mouth daily. 11/04/23   Galvin Jules, FNP  metoprolol  succinate (TOPROL -XL) 25 MG 24 hr tablet Take 1 tablet (25 mg total) by mouth daily. 09/12/23   Galvin Jules, FNP  Vitamin D , Ergocalciferol , (DRISDOL ) 1.25 MG (50000 UNIT) CAPS capsule Take 1 capsule (50,000 Units total) by mouth every 7 (seven) days. 11/05/23   RakesGeorgeann Kindred, FNP     Family History  Problem Relation Age of Onset   Diabetes Brother    Diabetes Mother    Leukemia Brother    Diabetes Son    Colon cancer Neg Hx     Social History   Socioeconomic History   Marital status: Married    Spouse name: Not on file   Number of children: 3   Years of education: 10   Highest education level: Not on file  Occupational History   Not on file  Tobacco Use   Smoking status: Former    Current packs/day: 0.00    Average packs/day: 3.0 packs/day for 30.0 years (90.0 ttl pk-yrs)    Types: Cigarettes    Start date: 04/26/1969    Quit date: 04/27/1999    Years since quitting: 24.5   Smokeless tobacco: Never  Vaping Use   Vaping status: Never Used  Substance and Sexual Activity   Alcohol use: Yes    Comment: daily. 2-6 beer daily   Drug use: Never   Sexual activity: Yes  Other Topics Concern   Not on file  Social History Narrative   Not on file   Social Drivers of Health   Financial Resource Strain: Low Risk  (10/22/2022)   Overall Financial Resource Strain (CARDIA)    Difficulty of Paying Living Expenses: Not hard at all  Food Insecurity: No Food Insecurity (10/22/2022)   Hunger Vital Sign    Worried About Running Out of Food in the Last Year: Never true    Ran Out of Food in the Last Year: Never true  Transportation Needs: No Transportation Needs (10/22/2022)   PRAPARE - Administrator, Civil Service (Medical): No    Lack of Transportation (Non-Medical): No  Physical Activity: Sufficiently Active  (10/22/2022)   Exercise Vital Sign    Days of Exercise per Week: 5 days    Minutes of Exercise per Session: 30 min  Stress: No Stress Concern Present (10/22/2022)   [employer] of Occupational Health - Occupational Stress Questionnaire    Feeling of Stress : Not at all  Social Connections: Moderately Isolated (10/22/2022)   Social Connection and Isolation Panel [NHANES]    Frequency of Communication with Friends and Family: More than three times a week    Frequency of Social Gatherings with Friends and Family: More than three times a week    Attends Religious Services: Never    Database administrator or Organizations: No    Attends Banker Meetings: Never    Marital Status: Married     Review of Systems: A 12 point ROS discussed and pertinent positives are  indicated in the HPI above.  All other systems are negative.  Vital Signs: There were no vitals at time of consent.  Advance Care Plan: The advanced care place/surrogate decision maker was discussed at the time of visit and the patient did not wish to discuss or was not able to name a surrogate decision maker or provide an advance care plan.  Physical Exam Constitutional:      Appearance: Normal appearance.  Cardiovascular:     Rate and Rhythm: Normal rate.  Pulmonary:     Effort: Pulmonary effort is normal.  Musculoskeletal:        General: Normal range of motion.  Skin:    General: Skin is warm and dry.  Neurological:     Mental Status: He is alert and oriented to person, place, and time.     Imaging: No results found.  Labs:  CBC: Recent Labs    01/09/23 1438 03/24/23 1353 10/15/23 1530 11/04/23 1018  WBC 6.5 8.2 7.2 6.7  HGB 13.8 12.8* 13.3 13.5  HCT 40.5 38.8* 38.9* 40.7  PLT 241 203 273 236    COAGS: No results for input(s): "INR", "APTT" in the last 8760 hours.  BMP: Recent Labs    12/31/22 1433 01/09/23 1438 03/24/23 1353 10/15/23 1530 11/04/23 1018  NA 137 139 136 137  140  K 4.2 4.6 4.2 4.1 5.0  CL 101 100 99 96* 100  CO2 28 24 28 28 27   GLUCOSE 103* 108* 101* 133* 108*  BUN 17 23 21 20 20   CALCIUM 9.1 9.6 9.1 9.7 9.6  CREATININE 0.93 1.02 1.03 1.05 0.98  GFRNONAA >60  --  >60 >60  --     LIVER FUNCTION TESTS: Recent Labs    01/09/23 1438 03/24/23 1353 10/15/23 1530 11/04/23 1018  BILITOT 0.3 0.6 0.6 0.3  AST 21 20 28 17   ALT 18 21 21 13   ALKPHOS 53 48 44 56  PROT 6.4 6.6 7.0 6.3  ALBUMIN  4.3 4.0 4.2 4.3    TUMOR MARKERS: No results for input(s): "AFPTM", "CEA", "CA199", "CHROMGRNA" in the last 8760 hours.  Assessment and Plan: Per Dr. Milburn Aliment progress note on 4/16: " Double hit lymphoma: - He denies any fevers, night sweats or weight loss. - No infections in the preceding 6 months.  He is working full-time. - PET scan (10/06/2023): Overall slightly increased in size of several nodal tissue, predominantly in the right occipital subcutaneous nodule.  It is easily palpable.  I have reviewed images of the PET scan and compared it with previous scans. - I have recommended ultrasound-guided biopsy and follow-up after that. - He forgot to do labs last week.  Will do labs today. - Differential includes indolent follicular lymphoma which was in the background of DLBCL at the time of diagnosis."  Patient presents for scheduled right occipital lesion biopsy in IR today.  Patient had been NPO for procedure, but elected for local anesthetic only prior to biopsy. Plan for local only.  All labs and medications are within acceptable parameters.  No pertinent allergies.   Risks and benefits of right occipital lesion biopsy was discussed with the patient and/or patient's family including, but not limited to bleeding, infection, damage to adjacent structures or low yield requiring additional tests.  All of the questions were answered and there is agreement to proceed.  Consent signed and in chart.    Thank you for allowing our service to  participate in DAGON BUDAI 's care.  Electronically  Signed: Lovena Rubinstein, PA-C   11/17/2023, 7:28 PM      I spent a total of 30 Minutes in face to face in clinical consultation, greater than 50% of which was counseling/coordinating care for a right occipital lesion in the setting of B cell lymphoma, with consideration for biopsy.

## 2023-11-18 ENCOUNTER — Other Ambulatory Visit: Payer: Self-pay | Admitting: Hematology

## 2023-11-18 ENCOUNTER — Ambulatory Visit (HOSPITAL_COMMUNITY)
Admission: RE | Admit: 2023-11-18 | Discharge: 2023-11-18 | Disposition: A | Discharge status: Home / Self Care | Source: Ambulatory Visit | Attending: Hematology | Admitting: Hematology

## 2023-11-18 DIAGNOSIS — C8290 Follicular lymphoma, unspecified, unspecified site: Secondary | ICD-10-CM | POA: Diagnosis not present

## 2023-11-18 DIAGNOSIS — C8338 Diffuse large B-cell lymphoma, lymph nodes of multiple sites: Secondary | ICD-10-CM

## 2023-11-18 DIAGNOSIS — E785 Hyperlipidemia, unspecified: Secondary | ICD-10-CM | POA: Insufficient documentation

## 2023-11-18 DIAGNOSIS — C8510 Unspecified B-cell lymphoma, unspecified site: Secondary | ICD-10-CM | POA: Diagnosis not present

## 2023-11-18 DIAGNOSIS — F419 Anxiety disorder, unspecified: Secondary | ICD-10-CM | POA: Insufficient documentation

## 2023-11-18 DIAGNOSIS — F32A Depression, unspecified: Secondary | ICD-10-CM | POA: Insufficient documentation

## 2023-11-18 DIAGNOSIS — Z8572 Personal history of non-Hodgkin lymphomas: Secondary | ICD-10-CM | POA: Insufficient documentation

## 2023-11-18 DIAGNOSIS — I10 Essential (primary) hypertension: Secondary | ICD-10-CM | POA: Insufficient documentation

## 2023-11-18 DIAGNOSIS — R220 Localized swelling, mass and lump, head: Secondary | ICD-10-CM | POA: Diagnosis not present

## 2023-11-18 MED ORDER — LIDOCAINE HCL 1 % IJ SOLN
INTRAMUSCULAR | Status: AC
  Filled 2023-11-18: qty 20

## 2023-11-19 ENCOUNTER — Telehealth: Payer: Self-pay | Admitting: *Deleted

## 2023-11-19 NOTE — Telephone Encounter (Signed)
 Multiple messages left advising patient of new appointment date and time.

## 2023-11-20 LAB — SURGICAL PATHOLOGY

## 2023-11-21 LAB — SURGICAL PATHOLOGY

## 2023-11-25 ENCOUNTER — Inpatient Hospital Stay: Attending: Hematology | Admitting: Hematology

## 2023-11-25 VITALS — BP 137/52 | HR 52 | Temp 98.5°F | Resp 18 | Wt 232.1 lb

## 2023-11-25 DIAGNOSIS — Z79899 Other long term (current) drug therapy: Secondary | ICD-10-CM | POA: Insufficient documentation

## 2023-11-25 DIAGNOSIS — Z87891 Personal history of nicotine dependence: Secondary | ICD-10-CM | POA: Insufficient documentation

## 2023-11-25 DIAGNOSIS — C8290 Follicular lymphoma, unspecified, unspecified site: Secondary | ICD-10-CM | POA: Diagnosis not present

## 2023-11-25 DIAGNOSIS — C8338 Diffuse large B-cell lymphoma, lymph nodes of multiple sites: Secondary | ICD-10-CM | POA: Insufficient documentation

## 2023-11-25 NOTE — Progress Notes (Signed)
 [facility] [address] [place] [address]    Clinic Day:  11/25/2023  Referring physician: Galvin Jules, FNP  Patient Care Team: Kevin Jules, FNP as PCP - General (Family Medicine) Kevin Cheadle Windsor Hatcher, MD as Consulting Physician (Gastroenterology) Kevin Boros, MD as Consulting Physician (Hematology)   ASSESSMENT & PLAN:   Assessment: 1.  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 -6 cycles of R-EPOCH from 05/11/2019 through 08/23/2019. -PET scan on 09/17/2019 showed central abdominal mass with SUV 2.9, measuring 10 x 3.7 cm.  Mildly enlarged right pelvic sidewall lymph node 12 mm SUV 1.7.  Areas of multifocal bone involvement with near complete resolution.  Deauville 2/3 in the five-point scale. -He was evaluated by lymphoma specialist Dr. Vaidya at the request [facility].  His case was discussed at tumor board.  No adjuvant radiation therapy or maintenance rituximab  or transplant was recommended at this time. -PET scan on 12/14/2019 shows soft tissue and nodularity at the root and within the small bowel mesentery, grossly similar measuring 4.2 x 11.2 cm with SUV max 3.6, similar to 3.2 previously.  External iliac and inguinal lymph nodes do not show metabolic some low blood pool.  SUV 2.3. -Enlarging rounded soft tissue in the right maxillary sinus with periosteal thickening and mild hypermetabolic zone. -PET scan on 03/27/2020 shows confluent soft tissue density in the central small bowel mesentery 11.6 x 4.2 cm with no significant change in size.  SUV 3.0, previously 3.6.  Small left para-aortic lymph node 1 cm with no FDG uptake.  Right external iliac lymph node, 10 mm with SUV 1.8, previously 2.3.  No new or progressive areas.     2.  Bilateral leg  DVT: -Ultrasound Doppler on 04/18/2019 showed bilateral leg DVT. -He is on Eliquis  since then. -Repeat Doppler on 12/29/2019 shows previous extensive right lower extremity DVT has resolved with minor residual nonocclusive right peroneal thrombus noted.  Very minimal residual thrombus burden.  Negative left leg DVT. - Repeat Dopplers negative on 04/03/2020.  Eliquis  was discontinued.   3.  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    Plan: 1.  Double hit lymphoma: - PET scan (10/05/2020): Overall slightly increase in size of the several nodal tissues, predominantly in the right occipital subcutaneous nodule. - Biopsy of the right occipital nodule on 11/18/2023. - We reviewed pathology reports which showed follicular lymphoma, mostly low-grade with focal areas concerning for high-grade (3A). - He does not have any B symptoms.  No recurrent infections.  He is working full-time at a [employer].  Labs on 10/15/2023: Normal LDH and LFTs and CBC. - I discussed the pathology report in detail and indication for treatment for follicular lymphoma including B symptoms, cytopenias, threatened endorgan function, clinically significant bulky disease and steady/rapid progression.  He does not have any indications for treatment at this time.  Hence have recommended active surveillance with repeat PET scan in 6 months and labs.   Orders Placed This Encounter  Procedures   NM PET Image Restage (PS) Skull Base to Thigh (F-18 FDG)    Standing Status:   Future    Expected Date:   05/27/2024    Expiration Date:   11/24/2024    If indicated  for the ordered procedure, I authorize the administration of a radiopharmaceutical per Radiology protocol:   Yes    Preferred imaging location?:   [facility]   CBC with Differential    Standing Status:   Future    Expected Date:   05/24/2024     Expiration Date:   11/24/2024   Comprehensive metabolic panel    Standing Status:   Future    Expected Date:   05/24/2024    Expiration Date:   11/24/2024   Lactate dehydrogenase    Standing Status:   Future    Expected Date:   05/24/2024    Expiration Date:   11/24/2024     Kevin [name],acting as a scribe for Kevin Boros, MD.,have documented all relevant documentation on the behalf of Kevin Boros, MD,as directed by  Kevin Boros, MD while in the presence of Kevin Boros, MD.  I, Kevin Boros MD, have reviewed the above documentation for accuracy and completeness, and I agree with the above.    Kevin Boros, MD   5/27/20251:31 PM  CHIEF COMPLAINT:   Diagnosis: high-grade DLBCL    Cancer Staging  No matching staging information was found for the patient.    Prior Therapy: R-EPOCH x 6 cycles from 05/11/2019 to 08/23/2019   Current Therapy:  surveillance   HISTORY OF PRESENT ILLNESS:   Oncology History  Diffuse large B-cell lymphoma of lymph nodes of multiple regions (HCC)  04/26/2019 Initial Diagnosis   Diffuse large B-cell lymphoma of lymph nodes of multiple regions (HCC)   05/10/2019 - 08/30/2019 Chemotherapy   Patient is on Treatment Plan : IP NON-HODGKINS LYMPHOMA R-EPOCH q21d     05/11/2019 - 05/11/2019 Chemotherapy   The patient had DOXOrubicin  (ADRIAMYCIN ) chemo injection 104 mg, 50 mg/m2, Intravenous,  Once, 0 of 6 cycles palonosetron (ALOXI) injection 0.25 mg, 0.25 mg, Intravenous,  Once, 0 of 6 cycles pegfilgrastim -jmdb (FULPHILA ) injection 6 mg, 6 mg, Subcutaneous,  Once, 0 of 6 cycles vinCRIStine  (ONCOVIN ) 2 mg in sodium chloride  0.9 % 50 mL chemo infusion, 2 mg, Intravenous,  Once, 0 of 6 cycles cyclophosphamide  (CYTOXAN ) 1,540 mg in sodium chloride  0.9 % 250 mL chemo infusion, 750 mg/m2, Intravenous,  Once, 0 of 6 cycles fosaprepitant (EMEND) 150 mg, dexamethasone  (DECADRON ) 12 mg in sodium chloride  0.9 % 145 mL  IVPB, , Intravenous,  Once, 0 of 6 cycles riTUXimab -pvvr (RUXIENCE ) 800 mg in sodium chloride  0.9 % 250 mL (2.4242 mg/mL) infusion, 375 mg/m2, Intravenous,  Once, 0 of 6 cycles  for chemotherapy treatment.       INTERVAL HISTORY:   Kevin [name] is a 68 y.o. male seen for follow-up of large B-cell lymphoma, high-grade. He was last seen by me on 10/15/23.  Since his last visit, he underwent biopsy of right occipital lymph node on 11/18/23. Pathology of the biopsy revealed: Follicular lymphoma with follicles positive for CD20, CD10, BCL2 and PAX5.  CD3 and CD5 highlight background T lymphocytes. Flow cytometry identified a clonal B-cell population.   Today, he states that he is doing well overall. His appetite level is at 100%. His energy level is at 100%. Lathaniel denies any fevers, night sweats, or unintentional weight loss. He notes normal energy levels and is able to continue working without issue.   PAST MEDICAL HISTORY:   Past Medical History: Past Medical History:  Diagnosis Date   Anxiety    Depression    Diffuse large B cell lymphoma (HCC) 04/26/2019   Hyperlipidemia 02/01/2014   Hypertension  Leg DVT (deep venous thromboembolism), acute, bilateral (HCC) 04/18/2019   Port-A-Cath in place 04/27/2019    Surgical History: Past Surgical History:  Procedure Laterality Date   COLONOSCOPY WITH PROPOFOL  N/A 12/25/2020   Procedure: COLONOSCOPY WITH PROPOFOL ;  Surgeon: Suzette Espy, MD;  Location: [facility];  Service: Endoscopy;  Laterality: N/A;  2:15pm   CYSTOSCOPY W/ URETERAL STENT PLACEMENT Bilateral 04/21/2019   Procedure: CYSTOSCOPY WITH RETROGRADE PYELOGRAM/URETERAL STENT PLACEMENT;  Surgeon: Marco Severs, MD;  Location: [facility];  Service: Urology;  Laterality: Bilateral;   ETHMOIDECTOMY Right 02/04/2020   Procedure: ETHMOIDECTOMY;  Surgeon: Reynold Caves, MD;  Location: [facility];  Service: ENT;  Laterality: Right;   INGUINAL LYMPH NODE BIOPSY Right 04/21/2019    Procedure: INGUINAL LYMPH NODE BIOPSY;  Surgeon: Awilda Bogus, MD;  Location: [facility];  Service: General;  Laterality: Right;   MAXILLARY ANTROSTOMY Right 02/04/2020   Procedure: MAXILLARY ANTROSTOMY WITH TISSUE REMOVAL;  Surgeon: Reynold Caves, MD;  Location: [facility];  Service: ENT;  Laterality: Right;   PORTACATH PLACEMENT Left 05/07/2019   Procedure: INSERTION PORT-A-CATH (catheter left subclavian);  Surgeon: Awilda Bogus, MD;  Location: [facility];  Service: General;  Laterality: Left;    Social History: Social History   Socioeconomic History   Marital status: Married    Spouse name: Not on file   Number of children: 3   Years of education: 10   Highest education level: Not on file  Occupational History   Not on file  Tobacco Use   Smoking status: Former    Current packs/day: 0.00    Average packs/day: 3.0 packs/day for 30.0 years (90.0 ttl pk-yrs)    Types: Cigarettes    Start date: 04/26/1969    Quit date: 04/27/1999    Years since quitting: 24.5   Smokeless tobacco: Never  Vaping Use   Vaping status: Never Used  Substance and Sexual Activity   Alcohol use: Yes    Comment: daily. 2-6 beer daily   Drug use: Never   Sexual activity: Yes  Other Topics Concern   Not on file  Social History Narrative   Not on file   Social Drivers of Health   Financial Resource Strain: Low Risk  (10/22/2022)   Overall Financial Resource Strain (CARDIA)    Difficulty of Paying Living Expenses: Not hard at all  Food Insecurity: No Food Insecurity (10/22/2022)   Hunger Vital Sign    Worried About Running Out of Food in the Last Year: Never true    Ran Out of Food in the Last Year: Never true  Transportation Needs: No Transportation Needs (10/22/2022)   PRAPARE - Administrator, Civil Service (Medical): No    Lack of Transportation (Non-Medical): No  Physical Activity: Sufficiently Active (10/22/2022)   Exercise Vital Sign    Days of Exercise per Week: 5 days     Minutes of Exercise per Session: 30 min  Stress: No Stress Concern Present (10/22/2022)   [employer] of Occupational Health - Occupational Stress Questionnaire    Feeling of Stress : Not at all  Social Connections: Moderately Isolated (10/22/2022)   Social Connection and Isolation Panel [NHANES]    Frequency of Communication with Friends and Family: More than three times a week    Frequency of Social Gatherings with Friends and Family: More than three times a week    Attends Religious Services: Never    Database administrator or Organizations:  No    Attends Club or Organization Meetings: Never    Marital Status: Married  Catering manager Violence: Not At Risk (10/22/2022)   Humiliation, Afraid, Rape, and Kick questionnaire    Fear of Current or Ex-Partner: No    Emotionally Abused: No    Physically Abused: No    Sexually Abused: No    Family History: Family History  Problem Relation Age of Onset   Diabetes Brother    Diabetes Mother    Leukemia Brother    Diabetes Son    Colon cancer Neg Hx     Current Medications:  Current Outpatient Medications:    amLODipine  (NORVASC ) 10 MG tablet, Take 1 tablet (10 mg total) by mouth daily., Disp: 90 tablet, Rfl: 1   aspirin EC 81 MG tablet, Take 81 mg by mouth daily. Swallow whole., Disp: , Rfl:    escitalopram  (LEXAPRO ) 10 MG tablet, Take 1 tablet (10 mg total) by mouth daily., Disp: 90 tablet, Rfl: 1   fenofibrate  (TRICOR ) 145 MG tablet, Take 1 tablet (145 mg total) by mouth daily., Disp: 90 tablet, Rfl: 3   fluticasone  (FLONASE ) 50 MCG/ACT nasal spray, Place 2 sprays into both nostrils daily., Disp: 16 g, Rfl: 6   LORazepam  (ATIVAN ) 0.5 MG tablet, TAKE 1 TABLET (0.5 MG TOTAL) BY MOUTH EVERY 8 (EIGHT) HOURS. (Patient taking differently: Take 0.5 mg by mouth every 8 (eight) hours as needed for anxiety.), Disp: 60 tablet, Rfl: 3   losartan -hydrochlorothiazide  (HYZAAR) 100-25 MG tablet, Take 1 tablet by mouth daily., Disp: 90  tablet, Rfl: 1   metoprolol  succinate (TOPROL -XL) 25 MG 24 hr tablet, Take 1 tablet (25 mg total) by mouth daily., Disp: 90 tablet, Rfl: 3   Vitamin D , Ergocalciferol , (DRISDOL ) 1.25 MG (50000 UNIT) CAPS capsule, Take 1 capsule (50,000 Units total) by mouth every 7 (seven) days., Disp: 5 capsule, Rfl: 6 No current facility-administered medications for this visit.  Facility-Administered Medications Ordered in Other Visits:    0.9 %  sodium chloride  infusion, , Intravenous, Continuous, Kevin Boros, MD, Last Rate: 20 mL/hr at 08/26/19 1105, New Bag at 08/26/19 1105   sodium chloride  flush (NS) 0.9 % injection 10 mL, 10 mL, Intravenous, PRN, Aadyn Buchheit, MD, 10 mL at 08/26/19 1104   sodium chloride  flush (NS) 0.9 % injection 10 mL, 10 mL, Intravenous, PRN, Nazaiah Navarrete, MD, 10 mL at 06/22/21 1107   Allergies: No Known Allergies  REVIEW OF SYSTEMS:   Review of Systems  Constitutional:  Negative for chills, fatigue and fever.  HENT:   Negative for lump/mass, mouth sores, nosebleeds, sore throat and trouble swallowing.   Eyes:  Negative for eye problems.  Respiratory:  Positive for cough. Negative for shortness of breath.   Cardiovascular:  Negative for chest pain, leg swelling and palpitations.  Gastrointestinal:  Negative for abdominal pain, constipation, diarrhea, nausea and vomiting.  Genitourinary:  Negative for bladder incontinence, difficulty urinating, dysuria, frequency, hematuria and nocturia.   Musculoskeletal:  Negative for arthralgias, back pain, flank pain, myalgias and neck pain.  Skin:  Negative for itching and rash.  Neurological:  Negative for dizziness, headaches and numbness.  Hematological:  Does not bruise/bleed easily.  Psychiatric/Behavioral:  Negative for depression, sleep disturbance and suicidal ideas. The patient is not nervous/anxious.   All other systems reviewed and are negative.    VITALS:   Blood pressure (!) 137/52, pulse (!) 52,  temperature 98.5 F (36.9 C), temperature source Oral, resp. rate 18, weight 232 lb 2.3 oz (  105.3 kg), SpO2 97%.  Wt Readings from Last 3 Encounters:  11/25/23 232 lb 2.3 oz (105.3 kg)  11/04/23 233 lb 3.2 oz (105.8 kg)  10/15/23 230 lb (104.3 kg)    Body mass index is 37.47 kg/m.  Performance status (ECOG): 1 - Symptomatic but completely ambulatory  PHYSICAL EXAM:   Physical Exam Vitals and nursing note reviewed. Exam conducted with a chaperone present.  Constitutional:      Appearance: Normal appearance.  Cardiovascular:     Rate and Rhythm: Normal rate and regular rhythm.     Pulses: Normal pulses.     Heart sounds: Normal heart sounds.  Pulmonary:     Effort: Pulmonary effort is normal.     Breath sounds: Normal breath sounds.  Abdominal:     Palpations: Abdomen is soft. There is no hepatomegaly, splenomegaly or mass.     Tenderness: There is no abdominal tenderness.  Musculoskeletal:     Right lower leg: No edema.     Left lower leg: No edema.  Lymphadenopathy:     Cervical: No cervical adenopathy.     Right cervical: No superficial, deep or posterior cervical adenopathy.    Left cervical: No superficial, deep or posterior cervical adenopathy.     Upper Body:     Right upper body: No supraclavicular or axillary adenopathy.     Left upper body: No supraclavicular or axillary adenopathy.  Neurological:     General: No focal deficit present.     Mental Status: He is alert and oriented to person, place, and time.  Psychiatric:        Mood and Affect: Mood normal.        Behavior: Behavior normal.   Right occipital subcutaneous nodule as well as bilateral inguinal adenopathy palpable.  LABS:      Latest Ref Rng & Units 11/04/2023   10:18 AM 10/15/2023    3:30 PM 03/24/2023    1:53 PM  CBC  WBC 3.4 - 10.8 x10E3/uL 6.7  7.2  8.2   Hemoglobin 13.0 - 17.7 g/dL 16.1  09.6  04.5   Hematocrit 37.5 - 51.0 % 40.7  38.9  38.8   Platelets 150 - 450 x10E3/uL 236  273  203        Latest Ref Rng & Units 11/04/2023   10:18 AM 10/15/2023    3:30 PM 03/24/2023    1:53 PM  CMP  Glucose 70 - 99 mg/dL 409  811  914   BUN 8 - 27 mg/dL 20  20  21    Creatinine 0.76 - 1.27 mg/dL 7.82  9.56  2.13   Sodium 134 - 144 mmol/L 140  137  136   Potassium 3.5 - 5.2 mmol/L 5.0  4.1  4.2   Chloride 96 - 106 mmol/L 100  96  99   CO2 20 - 29 mmol/L 27  28  28    Calcium 8.6 - 10.2 mg/dL 9.6  9.7  9.1   Total Protein 6.0 - 8.5 g/dL 6.3  7.0  6.6   Total Bilirubin 0.0 - 1.2 mg/dL 0.3  0.6  0.6   Alkaline Phos 44 - 121 IU/L 56  44  48   AST 0 - 40 IU/L 17  28  20    ALT 0 - 44 IU/L 13  21  21       No results found for: "CEA1", "CEA" / No results found for: "CEA1", "CEA" No results found for: "PSA1" No results found for: "  XBM841" No results found for: "CAN125"  No results found for: "TOTALPROTELP", "ALBUMINELP", "A1GS", "A2GS", "BETS", "BETA2SER", "GAMS", "MSPIKE", "SPEI" Lab Results  Component Value Date   TIBC 335 11/24/2019   TIBC 284 10/25/2019   FERRITIN 220 11/24/2019   FERRITIN 236 10/25/2019   FERRITIN 318 04/17/2019   IRONPCTSAT 10 (L) 11/24/2019   IRONPCTSAT 8 (L) 10/25/2019   Lab Results  Component Value Date   LDH 130 10/15/2023   LDH 125 03/24/2023   LDH 129 12/31/2022     STUDIES:   US  CORE BIOPSY (SOFT TISSUE) Result Date: 11/18/2023 INDICATION: 68 year old male with history of lymphoma and enlarging right occipital mass hypermetabolic on recent PET-CT. EXAM: 10/06/2023 MEDICATIONS: None. ANESTHESIA/SEDATION: None. FLUOROSCOPY TIME:  None. COMPLICATIONS: None immediate. PROCEDURE: Informed written consent was obtained from the patient after a thorough discussion of the procedural risks, benefits and alternatives. All questions were addressed. Maximal Sterile Barrier Technique was utilized including caps, mask, sterile gowns, sterile gloves, sterile drape, hand hygiene and skin antiseptic. A timeout was performed prior to the initiation of the procedure.  Preprocedure ultrasound evaluation of the right occipital region demonstrated an ovoid shaped heterogeneously hypoechoic mass in the subdermal space measuring up to approximately 3 cm. The procedure was planned. Subdermal Local anesthesia was provided at the planned needle entry site with 1% lidocaine . Under direct ultrasound visualization, deeper local anesthetic was provided to the periphery of the mass. Under direct ultrasound visualization, a 17 gauge coaxial introducer needle was directed to the periphery of the mass. Next, a total of 3, 18 gauge core biopsies were obtained and placed on saline soaked Telfa pad. The needle was removed. Postprocedure ultrasound evaluation demonstrated no evidence of surrounding hematoma or other complicating features. A bandage was applied. The patient tolerated the procedure well and was discharged home in good condition. IMPRESSION: Technically successful ultrasound-guided right occipital mass/lymph node biopsy. Creasie Doctor, MD [facility] Electronically Signed   By: Creasie Doctor M.D.   On: 11/18/2023 16:13

## 2023-11-25 NOTE — Patient Instructions (Addendum)
 [facility] at [facility] Discharge Instructions   You were seen and examined today by Dr. Cheree Cords.  He reviewed the results of your biopsy that is showing a slow growing lymphoma called follicular lymphoma. Because you are not having any symptoms, this does not require treatment.   We will see you back in 6 months.   Return as scheduled.    Thank you for choosing [facility] at [facility] to provide your oncology and hematology care.  To afford each patient quality time with our provider, please arrive at least 15 minutes before your scheduled appointment time.   If you have a lab appointment with the Cancer Center please come in thru the Main Entrance and check in at the main information desk.  You need to re-schedule your appointment should you arrive 10 or more minutes late.  We strive to give you quality time with our providers, and arriving late affects you and other patients whose appointments are after yours.  Also, if you no show three or more times for appointments you may be dismissed from the clinic at the providers discretion.     Again, thank you for choosing [facility].  Our hope is that these requests will decrease the amount of time that you wait before being seen by our physicians.       _____________________________________________________________  Should you have questions after your visit to [facility], please contact our office at [phone] and follow the prompts.  Our office hours are 8:00 a.m. and 4:30 p.m. Monday - Friday.  Please note that voicemails left after 4:00 p.m. may not be returned until the following business day.  We are closed weekends and major holidays.  You do have access to a nurse 24-7, just call the main number to the clinic [phone] and do not press any options, hold on the line and a nurse will answer the phone.    For prescription refill requests, have your  pharmacy contact our office and allow 72 hours.    Due to Covid, you will need to wear a mask upon entering the hospital. If you do not have a mask, a mask will be given to you at the Main Entrance upon arrival. For doctor visits, patients may have 1 support person age 24 or older with them. For treatment visits, patients can not have anyone with them due to social distancing guidelines and our immunocompromised population.

## 2024-01-26 ENCOUNTER — Encounter (HOSPITAL_COMMUNITY): Payer: Self-pay | Admitting: Hematology

## 2024-04-07 ENCOUNTER — Other Ambulatory Visit: Payer: Self-pay | Admitting: Family Medicine

## 2024-04-07 DIAGNOSIS — E781 Pure hyperglyceridemia: Secondary | ICD-10-CM

## 2024-04-07 DIAGNOSIS — F419 Anxiety disorder, unspecified: Secondary | ICD-10-CM

## 2024-04-07 DIAGNOSIS — I10 Essential (primary) hypertension: Secondary | ICD-10-CM

## 2024-05-06 ENCOUNTER — Ambulatory Visit: Payer: Self-pay | Admitting: Family Medicine

## 2024-05-06 ENCOUNTER — Encounter: Payer: Self-pay | Admitting: Family Medicine

## 2024-05-06 VITALS — BP 129/42 | HR 56 | Temp 97.3°F | Ht 66.0 in | Wt 230.0 lb

## 2024-05-06 DIAGNOSIS — I10 Essential (primary) hypertension: Secondary | ICD-10-CM

## 2024-05-06 DIAGNOSIS — Z23 Encounter for immunization: Secondary | ICD-10-CM

## 2024-05-06 DIAGNOSIS — E781 Pure hyperglyceridemia: Secondary | ICD-10-CM

## 2024-05-06 DIAGNOSIS — F419 Anxiety disorder, unspecified: Secondary | ICD-10-CM | POA: Diagnosis not present

## 2024-05-06 DIAGNOSIS — E559 Vitamin D deficiency, unspecified: Secondary | ICD-10-CM

## 2024-05-06 MED ORDER — FENOFIBRATE 145 MG PO TABS
145.0000 mg | ORAL_TABLET | Freq: Every day | ORAL | 3 refills | Status: AC
Start: 2024-05-06 — End: ?

## 2024-05-06 MED ORDER — AMLODIPINE BESYLATE 10 MG PO TABS: 10.0000 mg | ORAL_TABLET | Freq: Every day | ORAL | 3 refills | Status: AC

## 2024-05-06 MED ORDER — ESCITALOPRAM OXALATE 10 MG PO TABS: 10.0000 mg | ORAL_TABLET | Freq: Every day | ORAL | 3 refills | Status: AC

## 2024-05-06 MED ORDER — LOSARTAN POTASSIUM-HCTZ 100-25 MG PO TABS: 1.0000 | ORAL_TABLET | Freq: Every day | ORAL | 3 refills | Status: AC

## 2024-05-06 MED ORDER — METOPROLOL SUCCINATE ER 25 MG PO TB24: 25.0000 mg | ORAL_TABLET | Freq: Every day | ORAL | 3 refills | Status: AC

## 2024-05-06 NOTE — Progress Notes (Signed)
 Subjective:  Patient ID: Kevin [name], male    DOB: Oct 11, 1955, 68 y.o.   MRN: [id number]  Patient Care Team: Severa Rock HERO, FNP as PCP - General (Family Medicine) Shaaron Lamar HERO, MD as Consulting Physician (Gastroenterology)   Chief Complaint:  Medical Management of Chronic Issues   HPI: Kevin [name] is a 68 y.o. male presenting on 05/06/2024 for Medical Management of Chronic Issues   Kevin [name] is a 68 year old male who presents for a routine follow-up visit.  Metabolic and endocrine health - No new symptoms since last visit - Last laboratory evaluation in May showed glucosamine level of 108 and hemoglobin A1c of 5.3 - Scheduled for follow-up scan on November 24th with routine laboratory monitoring  Vitamin d  deficiency and musculoskeletal symptoms - Previously diagnosed with very low vitamin D  levels - Initiated on once-weekly vitamin D  therapy - Improvement in bone pain and increased ease of movement since starting therapy  Cardiovascular and lipid management - Takes fenofibrate  for hypertriglyceridemia without muscle aches or pain - Continues daily aspirin and antihypertensive medications including amlodipine , losartan , and hydrochlorothiazide  - No chest pain, leg swelling, or recent cardiovascular symptoms  Allergic rhinitis - Uses Flonase  as needed for allergy symptoms  General health and constitutional symptoms - No significant headaches, recent illnesses, surgeries, or hospitalizations - No changes in vision, but needs new glasses - No issues with bowel or bladder habits - No night sweats or weight loss     Relevant past medical, surgical, family, and social history reviewed and updated as indicated.  Allergies and medications reviewed and updated. Data reviewed: Chart in Epic.   Past Medical History:  Diagnosis Date   Anxiety    Depression    Diffuse large B cell lymphoma (HCC) 04/26/2019   Hyperlipidemia 02/01/2014   Hypertension     Leg DVT (deep venous thromboembolism), acute, bilateral (HCC) 04/18/2019   Port-A-Cath in place 04/27/2019    Past Surgical History:  Procedure Laterality Date   COLONOSCOPY WITH PROPOFOL  N/A 12/25/2020   Procedure: COLONOSCOPY WITH PROPOFOL ;  Surgeon: Shaaron Lamar HERO, MD;  Location: [facility];  Service: Endoscopy;  Laterality: N/A;  2:15pm   CYSTOSCOPY W/ URETERAL STENT PLACEMENT Bilateral 04/21/2019   Procedure: CYSTOSCOPY WITH RETROGRADE PYELOGRAM/URETERAL STENT PLACEMENT;  Surgeon: Sherrilee Belvie CROME, MD;  Location: [facility];  Service: Urology;  Laterality: Bilateral;   ETHMOIDECTOMY Right 02/04/2020   Procedure: ETHMOIDECTOMY;  Surgeon: Karis Clunes, MD;  Location: [facility];  Service: ENT;  Laterality: Right;   INGUINAL LYMPH NODE BIOPSY Right 04/21/2019   Procedure: INGUINAL LYMPH NODE BIOPSY;  Surgeon: Kallie Manuelita BROCKS, MD;  Location: [facility];  Service: General;  Laterality: Right;   MAXILLARY ANTROSTOMY Right 02/04/2020   Procedure: MAXILLARY ANTROSTOMY WITH TISSUE REMOVAL;  Surgeon: Karis Clunes, MD;  Location:  SUR[facility]ERY CENTER;  Service: ENT;  Laterality: Right;   PORTACATH PLACEMENT Left 05/07/2019   Procedure: INSERTION PORT-A-CATH (catheter left subclavian);  Surgeon: Kallie Manuelita BROCKS, MD;  Location: [facility];  Service: General;  Laterality: Left;    Social History   Socioeconomic History   Marital status: Married    Spouse name: Not on file   Number of children: 3   Years of education: 10   Highest education level: Not on file  Occupational History   Not on file  Tobacco Use   Smoking status: Former    Current packs/day: 0.00    Average packs/day: 3.0  packs/day for 30.0 years (90.0 ttl pk-yrs)    Types: Cigarettes    Start date: 04/26/1969    Quit date: 04/27/1999    Years since quitting: 25.0   Smokeless tobacco: Never  Vaping Use   Vaping status: Never Used  Substance and Sexual Activity   Alcohol use: Yes    Comment: daily. 2-6 beer daily    Drug use: Never   Sexual activity: Yes  Other Topics Concern   Not on file  Social History Narrative   Not on file   Social Drivers of Health   Financial Resource Strain: Low Risk  (10/22/2022)   Overall Financial Resource Strain (CARDIA)    Difficulty of Paying Living Expenses: Not hard at all  Food Insecurity: No Food Insecurity (10/22/2022)   Hunger Vital Sign    Worried About Running Out of Food in the Last Year: Never true    Ran Out of Food in the Last Year: Never true  Transportation Needs: No Transportation Needs (10/22/2022)   PRAPARE - Administrator, Civil Service (Medical): No    Lack of Transportation (Non-Medical): No  Physical Activity: Sufficiently Active (10/22/2022)   Exercise Vital Sign    Days of Exercise per Week: 5 days    Minutes of Exercise per Session: 30 min  Stress: No Stress Concern Present (10/22/2022)   [employer] of Occupational Health - Occupational Stress Questionnaire    Feeling of Stress : Not at all  Social Connections: Moderately Isolated (10/22/2022)   Social Connection and Isolation Panel    Frequency of Communication with Friends and Family: More than three times a week    Frequency of Social Gatherings with Friends and Family: More than three times a week    Attends Religious Services: Never    Database Administrator or Organizations: No    Attends Banker Meetings: Never    Marital Status: Married  Catering Manager Violence: Not At Risk (10/22/2022)   Humiliation, Afraid, Rape, and Kick questionnaire    Fear of Current or Ex-Partner: No    Emotionally Abused: No    Physically Abused: No    Sexually Abused: No    Outpatient Encounter Medications as of 05/06/2024  Medication Sig   aspirin EC 81 MG tablet Take 81 mg by mouth daily. Swallow whole.   fluticasone  (FLONASE ) 50 MCG/ACT nasal spray Place 2 sprays into both nostrils daily.   LORazepam  (ATIVAN ) 0.5 MG tablet TAKE 1 TABLET (0.5 MG TOTAL) BY MOUTH  EVERY 8 (EIGHT) HOURS. (Patient taking differently: Take 0.5 mg by mouth every 8 (eight) hours as needed for anxiety.)   Vitamin D , Ergocalciferol , (DRISDOL ) 1.25 MG (50000 UNIT) CAPS capsule Take 1 capsule (50,000 Units total) by mouth every 7 (seven) days.   [DISCONTINUED] amLODipine  (NORVASC ) 10 MG tablet TAKE 1 TABLET BY MOUTH EVERY DAY   [DISCONTINUED] escitalopram  (LEXAPRO ) 10 MG tablet TAKE 1 TABLET BY MOUTH EVERY DAY   [DISCONTINUED] fenofibrate  (TRICOR ) 145 MG tablet TAKE 1 TABLET BY MOUTH EVERY DAY   [DISCONTINUED] losartan -hydrochlorothiazide  (HYZAAR) 100-25 MG tablet TAKE 1 TABLET BY MOUTH EVERY DAY   [DISCONTINUED] metoprolol  succinate (TOPROL -XL) 25 MG 24 hr tablet TAKE 1 TABLET (25 MG TOTAL) BY MOUTH DAILY.   amLODipine  (NORVASC ) 10 MG tablet Take 1 tablet (10 mg total) by mouth daily.   escitalopram  (LEXAPRO ) 10 MG tablet Take 1 tablet (10 mg total) by mouth daily.   fenofibrate  (TRICOR ) 145 MG tablet Take 1 tablet (145 mg  total) by mouth daily.   losartan -hydrochlorothiazide  (HYZAAR) 100-25 MG tablet Take 1 tablet by mouth daily.   metoprolol  succinate (TOPROL -XL) 25 MG 24 hr tablet Take 1 tablet (25 mg total) by mouth daily.   Facility-Administered Encounter Medications as of 05/06/2024  Medication   0.9 %  sodium chloride  infusion   sodium chloride  flush (NS) 0.9 % injection 10 mL   sodium chloride  flush (NS) 0.9 % injection 10 mL    No Known Allergies  Pertinent ROS per HPI, otherwise unremarkable      Objective:  BP (!) 129/42   Pulse (!) 56   Temp (!) 97.3 F (36.3 C)   Ht 5' 6 (1.676 m)   Wt 230 lb (104.3 kg)   SpO2 92%   BMI 37.12 kg/m    Wt Readings from Last 3 Encounters:  05/06/24 230 lb (104.3 kg)  11/25/23 232 lb 2.3 oz (105.3 kg)  11/04/23 233 lb 3.2 oz (105.8 kg)    Physical Exam Vitals and nursing note reviewed.  Constitutional:      General: He is not in acute distress.    Appearance: Normal appearance. He is obese. He is not  ill-appearing, toxic-appearing or diaphoretic.  HENT:     Head: Normocephalic and atraumatic.     Nose: Nose normal.     Mouth/Throat:     Mouth: Mucous membranes are moist.  Eyes:     Conjunctiva/sclera: Conjunctivae normal.  Cardiovascular:     Rate and Rhythm: Normal rate and regular rhythm.     Heart sounds: Murmur heard.  Pulmonary:     Effort: Pulmonary effort is normal.     Breath sounds: Normal breath sounds.  Musculoskeletal:     Right lower leg: No edema.     Left lower leg: No edema.  Skin:    General: Skin is warm and dry.     Capillary Refill: Capillary refill takes less than 2 seconds.  Neurological:     General: No focal deficit present.     Mental Status: He is alert and oriented to person, place, and time.  Psychiatric:        Mood and Affect: Mood normal.        Behavior: Behavior normal.        Thought Content: Thought content normal.        Judgment: Judgment normal.      Results for orders placed or performed during the hospital encounter of 11/18/23  Surgical pathology   Collection Time: 11/18/23 12:00 AM  Result Value Ref Range   SURGICAL PATHOLOGY      Surgical Pathology CASE: [id number] PATIENT: DASIE LAMER Flow Pathology Report     Clinical history: lymphoma     DIAGNOSIS:  - Clonal B-cell population identified.  See comment.  COMMENT:  - Flow cytometric analysis identified a clonal B-cell population constituting 33% of lymphocytes.  The B cells are positive for CD10, CD19, CD20, CD38 and show kappa light chain restriction.  The findings are concerning for a CD10 positive mature B-cell lymphoma.  Please refer to case [id number] for additional details.  GATING AND PHENOTYPIC ANALYSIS:  Gated population: Flow cytometric immunophenotyping is performed using antibodies to the antigens listed in the table below. Electronic gates are placed around a cell cluster displaying light scatter properties corresponding to:  lymphocytes  Abnormal Cells in gated population: 33%  Phenotype of Abnormal Cells: CD10, CD19, CD20, CD38, Kappa  Lymphoid Antigens       Myeloid Antigens Miscella neous CD2  NEG  CD10 POS  CD11b     ND   CD45 POS CD3  NEG  CD19 POS  CD11c     ND   HLA-Dr    ND CD4  NEG  CD20 POS  CD13 ND   CD34 NEG CD5  NEG  CD22 ND   CD14 ND   CD38 POS CD7  NEG  CD79b     ND   CD15 ND   CD138     ND CD8  NEG  CD103     ND   CD16 ND   TdT  ND CD25 ND   CD200     NEG  CD33 ND   CD123     ND TCRab     ND   sKappa    POS  CD64 ND   CD41 ND TCRgd     NEG  sLambda   NEG  CD117     ND   CD61 ND CD56 NEG  cKappa    ND   MPO  ND   CD71 ND CD57 ND   cLambda   ND        CD235aND     GROSS DESCRIPTION:  Reference Tissue case [id number].    Final Diagnosis performed by Ilsa Pottier, MD.   Electronically signed 11/20/2023 Technical and / or Professional components performed at [facility], [address] [place], [place] [address].  The above tests were developed and their performance characteristics determined by the [facility] system for the physical and immunophenotypic characterization of cell populations. They have not b een cleared by the U.S. Food and Drug administration. The  FDA has determined that such clearance or approval is not necessary. This test is used for clinical purposes. It should not be  regarded as investigational or for research   Surgical pathology   Collection Time: 11/18/23  2:08 PM  Result Value Ref Range   SURGICAL PATHOLOGY      SURGICAL PATHOLOGY CASE: [id number] PATIENT: DASIE LAMER Surgical Pathology Report     Clinical History: Lymphoma ([place])     FINAL MICROSCOPIC DIAGNOSIS:  A. LYMPH NODE, BIOPSY: - Follicular lymphoma.  See comment.  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  Current morphologic evaluation of the lymph node needle  core biopsy reveals lymph node with effaced architecture comprised of small to medium sized cells with occasional large cells.  Immunohistochemically, the follicles are positive for CD20, CD10, BCL2 and PAX5.  CD3 and CD5 highlight background T lymphocytes.  Ki-67 is low overall, with focal accentuation in some areas.  CD21 highlights follicular dendritic meshwork.  BCL6 highlights residual germinal centers.  Cyclin D1 is negative.  Flow cytometric analysis reveals a clonal B-cell population comprising 33% of lymphocytes.  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  GROSS DESCRIPTION:  Received fresh are 3 cores of soft white tissue 0.7 to 1.5 cm in length and each less than 0.1 cm in diameter.  A portion of the specimen is placed in RPMI.  The remaining tissue is entirely submitted in 1 cassette.  [facility] 11/18/2023)   Final Diagnosis performed by Ilsa Pottier, MD.   Electronically signed 11/21/2023 Technical and / or Professional components performed at [facility]  [facility], [address], [place], [place] [address].  Immunohistochemistry Technical component (if applicable) was performed at [facility]. [address], [place], [place] [address].   IMMUNOHISTOCHEMISTRY DISCLAIMER (if applicable): Some of these immunohistochemical stains may have been developed and the performance characteristics determine by [facility]. Some may not have been cleared  or approved by the U.S. Food and Drug Administration. The FDA has determined that such clearance or approval is not necessary. This test is used for clinical purposes. It should not be regarded as investigational or for research. This laboratory is certified under the Clinical Laboratory Improvement Amendments of 1988 (CLIA-88) as qualified to  perform high complexity clinical laboratory testing.  The controls stained appropriately.   IHC stains are performed on formalin fixed, paraffin embedded tissue using a 3,3diaminobenzidine (DAB) chromogen and Leica Bond Autostainer System. The staining intensity of the nucleus is score manually and is reported as the percentage of tumor cell nuclei demonstrating specific nuclear staining. The specimens are fixed in 10% Neutral Formalin for at least 6 hours and up to 72hrs. These tests are validated on decalcified tissue. Results should be interpreted with caution given the possibility of false negative results on decalcified specim ens. Antibody Clones are as follows ER-clone 26F, PR-clone 16, Ki67- clone MM1. Some of these immunohistochemical stains may have been developed and the performance characteristics determined by [facility].        Pertinent labs & imaging results that were available during my care of the patient were reviewed by me and considered in my medical decision making.  Assessment & Plan:  Rajon Bisig was seen today for medical management of chronic issues.  Diagnoses and all orders for this visit:  Vitamin D  deficiency -     Vitamin D , 25-hydroxy -     BMP8+EGFR  Essential hypertension -     Lipid panel -     metoprolol  succinate (TOPROL -XL) 25 MG 24 hr tablet; Take 1 tablet (25 mg total) by mouth daily. -     losartan -hydrochlorothiazide  (HYZAAR) 100-25 MG tablet; Take 1 tablet by mouth daily. -     amLODipine  (NORVASC ) 10 MG tablet; Take 1 tablet (10 mg total) by mouth daily. -     BMP8+EGFR  Hypertriglyceridemia -     Lipid panel -     fenofibrate  (TRICOR ) 145 MG tablet; Take 1 tablet (145 mg total) by mouth daily. -     BMP8+EGFR  Anxiety -     Vitamin D , 25-hydroxy -     escitalopram  (LEXAPRO ) 10 MG tablet; Take 1 tablet (10 mg total) by mouth daily. -     BMP8+EGFR     Essential hypertension Blood pressure is well-controlled with  current medication regimen. No significant headaches, chest pain, or leg swelling reported. - Continue current antihypertensive medications: amlodipine , losartan , and hydrochlorothiazide . - Refilled all current medications.  Pure hyperglyceridemia Triglycerides are managed with fenofibrate . No muscle aches or pain reported. - Continue fenofibrate  for triglyceride management. - Ordered lab tests to check triglyceride levels.  Vitamin D  deficiency Vitamin D  levels were very low in May. He has been on weekly vitamin D  therapy, which has improved bone health and mobility. - Ordered lab tests to check vitamin D  and calcium levels. - Continue weekly vitamin D  therapy if levels remain below 19.  General Health Maintenance He is due for a flu shot and tetanus booster. Shingles vaccine is recommended due to potential chickenpox history. - Administered flu  shot today. - Recommended shingles vaccine. - Advised tetanus booster if cut or injury occurs.          Continue all other maintenance medications.  Follow up plan: Return in about 6 months (around 11/03/2024), or if symptoms worsen or fail to improve, for Annual Physical.   Continue healthy lifestyle choices, including diet (rich in fruits, vegetables, and lean proteins, and low in salt and simple carbohydrates) and exercise (at least 30 minutes of moderate physical activity daily).  Educational handout given for DASH diet, HTN  The above assessment and management plan was discussed with the patient. The patient verbalized understanding of and has agreed to the management plan. Patient is aware to call the clinic if they develop any new symptoms or if symptoms persist or worsen. Patient is aware when to return to the clinic for a follow-up visit. Patient educated on when it is appropriate to go to the emergency department.   Rosaline Bruns, FNP-C [facility] [phone]

## 2024-05-06 NOTE — Patient Instructions (Signed)
 Goal BP:  Less than 130/80  Take your medications faithfully as prescribed. Maintain a healthy weight. Get at least 150 minutes of aerobic exercise per week. Minimize salt intake, less than 2000 mg per day. Minimize alcohol intake.  DASH Eating Plan DASH stands for Dietary Approaches to Stop Hypertension. The DASH eating plan is a healthy eating plan that has been shown to reduce high blood pressure (hypertension). Additional health benefits may include reducing the risk of type 2 diabetes mellitus, heart disease, and stroke. The DASH eating plan may also help with weight loss.  WHAT DO I NEED TO KNOW ABOUT THE DASH EATING PLAN? For the DASH eating plan, you will follow these general guidelines: Choose foods with a percent daily value for sodium of less than 5% (as listed on the food label). Use salt-free seasonings or herbs instead of table salt or sea salt. Check with your health care provider or pharmacist before using salt substitutes. Eat lower-sodium products, often labeled as lower sodium or no salt added. Eat fresh foods. Eat more vegetables, fruits, and low-fat dairy products. Choose whole grains. Look for the word whole as the first word in the ingredient list. Choose fish and skinless chicken or [place] more often than red meat. Limit fish, poultry, and meat to 6 oz (170 g) each day. Limit sweets, desserts, sugars, and sugary drinks. Choose heart-healthy fats. Limit cheese to 1 oz (28 g) per day. Eat more home-cooked food and less restaurant, buffet, and fast food. Limit fried foods. Cook foods using methods other than frying. Limit canned vegetables. If you do use them, rinse them well to decrease the sodium. When eating at a restaurant, ask that your food be prepared with less salt, or no salt if possible.  WHAT FOODS CAN I EAT? Seek help from a dietitian for individual calorie needs.  Grains Whole grain or whole wheat bread. Hoshino rice. Whole grain or whole  wheat pasta. Quinoa, bulgur, and whole grain cereals. Low-sodium cereals. Corn or whole wheat flour tortillas. Whole grain cornbread. Whole grain crackers. Low-sodium crackers.  Vegetables Fresh or frozen vegetables (raw, steamed, roasted, or grilled). Low-sodium or reduced-sodium tomato and vegetable juices. Low-sodium or reduced-sodium tomato sauce and paste. Low-sodium or reduced-sodium canned vegetables.   Fruits All fresh, canned (in natural juice), or frozen fruits.  Meat and Other Protein Products Ground beef (85% or leaner), grass-fed beef, or beef trimmed of fat. Skinless chicken or [place]. Ground chicken or [place]. Pork trimmed of fat. All fish and seafood. Eggs. Dried beans, peas, or lentils. Unsalted nuts and seeds. Unsalted canned beans.  Dairy Low-fat dairy products, such as skim or 1% milk, 2% or reduced-fat cheeses, low-fat ricotta or cottage cheese, or plain low-fat yogurt. Low-sodium or reduced-sodium cheeses.  Fats and Oils Tub margarines without trans fats. Light or reduced-fat mayonnaise and salad dressings (reduced sodium). Avocado. Safflower, olive, or canola oils. Natural peanut or almond butter.  Other Unsalted popcorn and pretzels. The items listed above may not be a complete list of recommended foods or beverages. Contact your dietitian for more options.  WHAT FOODS ARE NOT RECOMMENDED?  Grains White bread. White pasta. White rice. Refined cornbread. Bagels and croissants. Crackers that contain trans fat.  Vegetables Creamed or fried vegetables. Vegetables in a cheese sauce. Regular canned vegetables. Regular canned tomato sauce and paste. Regular tomato and vegetable juices.  Fruits Dried fruits. Canned fruit in light or heavy syrup. Fruit juice.  Meat and Other Protein Products Fatty cuts of meat. Ribs,  chicken wings, bacon, sausage, bologna, salami, chitterlings, fatback, hot dogs, bratwurst, and packaged luncheon meats. Salted nuts and seeds. Canned  beans with salt.  Dairy Whole or 2% milk, cream, half-and-half, and cream cheese. Whole-fat or sweetened yogurt. Full-fat cheeses or blue cheese. Nondairy creamers and whipped toppings. Processed cheese, cheese spreads, or cheese curds.  Condiments Onion and garlic salt, seasoned salt, table salt, and sea salt. Canned and packaged gravies. Worcestershire sauce. Tartar sauce. Barbecue sauce. Teriyaki sauce. Soy sauce, including reduced sodium. Steak sauce. Fish sauce. Oyster sauce. Cocktail sauce. Horseradish. Ketchup and mustard. Meat flavorings and tenderizers. Bouillon cubes. Hot sauce. Tabasco sauce. Marinades. Taco seasonings. Relishes.  Fats and Oils Butter, stick margarine, lard, shortening, ghee, and bacon fat. Coconut, palm kernel, or palm oils. Regular salad dressings.  Other Pickles and olives. Salted popcorn and pretzels.  The items listed above may not be a complete list of foods and beverages to avoid. Contact your dietitian for more information.  WHERE CAN I FIND MORE INFORMATION? National Heart, Lung, and Blood Institute: [employer] Document Released: 06/06/2011 Document Revised: 11/01/2013 Document Reviewed: 04/21/2013 [facility] Patient Information 2015 [place], [place]. This information is not intended to replace advice given to you by your health care provider. Make sure you discuss any questions you have with your health care provider.   I think that you would greatly benefit from seeing a nutritionist.  If you are interested, please call Dr. Wonda at [phone] to schedule an appointment.

## 2024-05-07 ENCOUNTER — Ambulatory Visit: Payer: Self-pay | Admitting: Family Medicine

## 2024-05-07 LAB — BMP8+EGFR
BUN/Creatinine Ratio: 19 (ref 10–24)
BUN: 23 mg/dL (ref 8–27)
CO2: 27 mmol/L (ref 20–29)
Calcium: 9.9 mg/dL (ref 8.6–10.2)
Chloride: 100 mmol/L (ref 96–106)
Creatinine, Ser: 1.22 mg/dL (ref 0.76–1.27)
Glucose: 119 mg/dL — ABNORMAL HIGH (ref 70–99)
Potassium: 4.1 mmol/L (ref 3.5–5.2)
Sodium: 141 mmol/L (ref 134–144)
eGFR: 64 mL/min/1.73 (ref >59)

## 2024-05-07 LAB — VITAMIN D 25 HYDROXY (VIT D DEFICIENCY, FRACTURES): Vit D, 25-Hydroxy: 36.5 ng/mL (ref 30.0–100.0)

## 2024-05-07 LAB — LIPID PANEL
Chol/HDL Ratio: 3.4 ratio (ref 0.0–5.0)
Cholesterol, Total: 157 mg/dL (ref 100–199)
HDL: 46 mg/dL (ref >39)
LDL Chol Calc (NIH): 72 mg/dL (ref 0–99)
Triglycerides: 237 mg/dL — ABNORMAL HIGH (ref 0–149)
VLDL Cholesterol Cal: 39 mg/dL (ref 5–40)

## 2024-05-24 ENCOUNTER — Ambulatory Visit
Admission: RE | Admit: 2024-05-24 | Discharge: 2024-05-24 | Disposition: A | Discharge status: Home / Self Care | Source: Ambulatory Visit | Attending: Hematology | Admitting: Hematology

## 2024-05-24 ENCOUNTER — Inpatient Hospital Stay: Attending: Physician Assistant

## 2024-05-24 DIAGNOSIS — C8338 Diffuse large B-cell lymphoma, lymph nodes of multiple sites: Secondary | ICD-10-CM | POA: Insufficient documentation

## 2024-05-24 DIAGNOSIS — R590 Localized enlarged lymph nodes: Secondary | ICD-10-CM | POA: Diagnosis not present

## 2024-05-24 DIAGNOSIS — C8290 Follicular lymphoma, unspecified, unspecified site: Secondary | ICD-10-CM | POA: Diagnosis not present

## 2024-05-24 LAB — CBC WITH DIFFERENTIAL/PLATELET
Abs Immature Granulocytes: 0.03 K/uL (ref 0.00–0.07)
Basophils Absolute: 0 K/uL (ref 0.0–0.1)
Basophils Relative: 0 %
Eosinophils Absolute: 0.2 K/uL (ref 0.0–0.5)
Eosinophils Relative: 3 %
HCT: 39.9 % (ref 39.0–52.0)
Hemoglobin: 13.4 g/dL (ref 13.0–17.0)
Immature Granulocytes: 0 %
Lymphocytes Relative: 13 %
Lymphs Abs: 0.9 K/uL (ref 0.7–4.0)
MCH: 31.2 pg (ref 26.0–34.0)
MCHC: 33.6 g/dL (ref 30.0–36.0)
MCV: 92.8 fL (ref 80.0–100.0)
Monocytes Absolute: 0.5 K/uL (ref 0.1–1.0)
Monocytes Relative: 7 %
Neutro Abs: 5.3 K/uL (ref 1.7–7.7)
Neutrophils Relative %: 77 %
Platelets: 240 K/uL (ref 150–400)
RBC: 4.3 MIL/uL (ref 4.22–5.81)
RDW: 12.1 % (ref 11.5–15.5)
WBC: 7 K/uL (ref 4.0–10.5)
nRBC: 0 % (ref 0.0–0.2)

## 2024-05-24 LAB — COMPREHENSIVE METABOLIC PANEL WITH GFR
ALT: 15 U/L (ref 0–44)
AST: 20 U/L (ref 15–41)
Albumin: 4.3 g/dL (ref 3.5–5.0)
Alkaline Phosphatase: 47 U/L (ref 38–126)
Anion gap: 11 (ref 5–15)
BUN: 19 mg/dL (ref 8–23)
CO2: 29 mmol/L (ref 22–32)
Calcium: 9.4 mg/dL (ref 8.9–10.3)
Chloride: 101 mmol/L (ref 98–111)
Creatinine, Ser: 1.07 mg/dL (ref 0.61–1.24)
GFR, Estimated: >60 mL/min (ref >60)
Glucose, Bld: 184 mg/dL — ABNORMAL HIGH (ref 70–99)
Potassium: 3.9 mmol/L (ref 3.5–5.1)
Sodium: 141 mmol/L (ref 135–145)
Total Bilirubin: 0.2 mg/dL (ref 0.0–1.2)
Total Protein: 6.4 g/dL — ABNORMAL LOW (ref 6.5–8.1)

## 2024-05-24 LAB — LACTATE DEHYDROGENASE: LDH: 125 U/L (ref 105–235)

## 2024-05-24 LAB — GLUCOSE, CAPILLARY: Glucose-Capillary: 109 mg/dL — ABNORMAL HIGH (ref 70–99)

## 2024-05-24 MED ORDER — FLUDEOXYGLUCOSE F - 18 (FDG) INJECTION
12.4600 | Freq: Once | INTRAVENOUS | Status: AC | PRN
  Administered 2024-05-24: 12.46 via INTRAVENOUS

## 2024-05-24 NOTE — Progress Notes (Signed)
 Patients port flushed without difficulty.  Good blood return noted with no bruising or swelling noted at site.  Labs drawn per orders.  VSS with discharge and left in satisfactory condition with no s/s of distress noted. All follow ups as scheduled.       Kevin [name]

## 2024-05-29 NOTE — Progress Notes (Unsigned)
 [facility] 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, stage IV  PRIOR THERAPY: R-EPOCH x 6 cycles from 05/11/2019 to 08/23/2019   CURRENT THERAPY: Surveillance  BRIEF ONCOLOGIC HISTORY:   Oncology History  Diffuse large B-cell lymphoma of lymph nodes of multiple regions (HCC)  04/26/2019 Initial Diagnosis   Diffuse large B-cell lymphoma of lymph nodes of multiple regions [facility])   05/10/2019 - 08/30/2019 Chemotherapy   Patient is on Treatment Plan : IP NON-HODGKINS LYMPHOMA R-EPOCH q21d     05/11/2019 - 05/11/2019 Chemotherapy   The patient had DOXOrubicin  (ADRIAMYCIN ) chemo injection 104 mg, 50 mg/m2, Intravenous,  Once, 0 of 6 cycles palonosetron (ALOXI) injection 0.25 mg, 0.25 mg, Intravenous,  Once, 0 of 6 cycles pegfilgrastim -jmdb (FULPHILA ) injection 6 mg, 6 mg, Subcutaneous,  Once, 0 of 6 cycles vinCRIStine  (ONCOVIN ) 2 mg in sodium chloride  0.9 % 50 mL chemo infusion, 2 mg, Intravenous,  Once, 0 of 6 cycles cyclophosphamide  (CYTOXAN ) 1,540 mg in sodium chloride  0.9 % 250 mL chemo infusion, 750 mg/m2, Intravenous,  Once, 0 of 6 cycles fosaprepitant (EMEND) 150 mg, dexamethasone  (DECADRON ) 12 mg in sodium chloride  0.9 % 145 mL IVPB, , Intravenous,  Once, 0 of 6 cycles riTUXimab -pvvr (RUXIENCE ) 800 mg in sodium chloride  0.9 % 250 mL (2.4242 mg/mL) infusion, 375 mg/m2, Intravenous,  Once, 0 of 6 cycles  for chemotherapy treatment.     INTERVAL HISTORY:   [name], a 68 y.o. male, returns for routine follow-up of his double hit DLBCL in the background of follicular lymphoma.  Darion was last seen on 11/25/2023 by Dr. Rogers.   He  denies any recent surgeries, hospitalizations, or changes in baseline health status.  *** At today's visit, he  reports feeling ***.  He  reports ***% energy and ***% appetite.   ***He  is maintaining stable weight at this time.  He denies any new lumps or bumps.  *** He  has not had any fever, chills, night sweats, or unintentional weight loss.  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 ***He denies any frequent infections.*** ***No symptoms of recurrent DVT.  ASSESSMENT & PLAN:  1.  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  - Right inguinal lymph node biopsy on 04/21/2019 with high-grade lymphoma (40%) in the setting of follicular lymphoma (60%).  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 - 6 cycles of R-EPOCH from 05/11/2019 through 08/23/2019. - End-of-treatment PET/CT scan 09/17/2019 showed excellent response to therapy (Deauville 2) with SUV 2.98 in the central abdominal mass (measuring 10 x 3.7 cm), mildly enlarged right pelvic sidewall lymph node (12 mm, SUV 1.7), and areas of multifocal bone involvement with near complete resolution (Deauville 2/3) - He was evaluated by lymphoma specialist Dr. Vaidya at [facility] (April 2021).  His case was discussed at tumor board.  No adjuvant radiation therapy or maintenance rituximab  or transplant was recommended at that time.  Close surveillance was recommended.  Additional information from Dr. Darren below: Double  hit lymphomas, especially stage IV disease with extranodal involvement, have high risk of CNS relapse.   Patient has high risk for recurrent lymphoma. In the event of recurrence, would strongly consider CAR-T cell therapy with rituximab  Revlimid used as bridge to CAR-T cells. - PET scan on 12/14/2019 shows soft tissue and nodularity  at the root and within the small bowel mesentery, grossly similar measuring 4.2 x 11.2 cm with SUV max 3.6, similar to 3.2 previously.  External iliac and inguinal lymph nodes do not show metabolic some low blood pool.  SUV 2.3.  Enlarging rounded soft tissue in the right maxillary sinus with periosteal thickening and mild hypermetabolic zone. - Referred to Dr. Karis (ENT) due to enlarging soft tissue noted in right maxillary sinus seen on PET scan from June 2021. Right endoscopic maxillary antrostomy and total ethmoidectomy on 02/04/2020. Pathology consistent with Aspergillus fungus ball and chronic sinusitis.  - PET scan on 03/27/2020 shows confluent soft tissue density in the central small bowel mesentery 11.6 x 4.2 cm with no significant change in size.  SUV 3.0, previously 3.6.  Small left para-aortic lymph node 1 cm with no FDG uptake.  Right external iliac lymph node, 10 mm with SUV 1.8, previously 2.3.  No new or progressive areas. - PET scan (10/06/2023): Overall slightly increase in size of the several nodal tissues, predominantly in the right occipital subcutaneous nodule. - Biopsy of the right occipital nodule on 11/18/2023 - pathology reports which showed follicular lymphoma, mostly low-grade with focal areas concerning for high-grade (3A). - - - - - - - - - - - - - - - - - - - - - - - - - - - - - - - - - - - - - - - - - - - - - - - - - - - - - - - - - - - - - - - - - - - - - - - - - -  - Most recent PET scan (05/24/2024): *** - Most recent labs (05/24/2024): Normal CBC/D.  CMP unremarkable with normal LFTs.  Normal LDH. - *** Symptoms*** He does not have any B symptoms.  No recurrent infections.  He is working full-time at a trucking company. - PHYSICAL EXAM (05/31/2024): *** - PLAN: Indication for treatment of follicular lymphoma would include B symptoms, cytopenias, threatened endorgan function, clinically significant bulky disease, and steady/rapid progression.  He does not have any indication  for treatment at this time. - Recommend continued active surveillance due to high risk of recurrent double hit DLBCL, to include labs and PET scan in 6 months.  *** - ***Recommend alternating MD/APP visit.  2.  Bilateral leg DVT: - Ultrasound Doppler on 04/18/2019 showed bilateral leg DVT, at time of diagnosis of lymphoma (see above) - Repeat Doppler on 12/29/2019 shows previous extensive right lower extremity DVT has resolved with minor residual nonocclusive right peroneal thrombus noted.  Very minimal residual thrombus burden.  Negative left leg DVT. - Repeat Dopplers negative on 04/03/2020.  Eliquis  discontinued at that time. - He received treatment with Eliquis  x 1 year (October 20 22 April 2020)  3.  Aspergilloma: - Referred to Dr. Karis (ENT) due to enlarging soft tissue noted in right maxillary sinus seen on PET scan from June 2021. - Right endoscopic maxillary antrostomy and total ethmoidectomy on 02/04/2020. - Pathology consistent with Aspergillus fungus ball and chronic sinusitis.  4.  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  Quit smoking in 2000. - Daughter had melanoma excised from the neck.  Brother had leukemia in his 30s, received bone marrow transplant. - PLAN: ***   PLAN SUMMARY: >> *** >> *** >> ***   REVIEW OF SYSTEMS: ***  Review of Systems - Oncology  PHYSICAL EXAM:   Performance status (ECOG): {CHL ONC ED:[id number]} *** There were no vitals filed for this visit. Wt Readings from Last 3 Encounters:  05/06/24 230 lb (104.3 kg)  11/25/23 232 lb 2.3 oz (105.3 kg)  11/04/23 233 lb 3.2 oz (105.8 kg)   Physical Exam   PAST MEDICAL/SURGICAL HISTORY:  Past Medical History:  Diagnosis Date   Anxiety    Depression    Diffuse large B cell lymphoma (HCC) 04/26/2019   Hyperlipidemia 02/01/2014   Hypertension    Leg DVT (deep venous thromboembolism), acute, bilateral  (HCC) 04/18/2019   Port-A-Cath in place 04/27/2019   Past Surgical History:  Procedure Laterality Date   COLONOSCOPY WITH PROPOFOL  N/A 12/25/2020   Procedure: COLONOSCOPY WITH PROPOFOL ;  Surgeon: Shaaron Lamar HERO, MD;  Location: [facility];  Service: Endoscopy;  Laterality: N/A;  2:15pm   CYSTOSCOPY W/ URETERAL STENT PLACEMENT Bilateral 04/21/2019   Procedure: CYSTOSCOPY WITH RETROGRADE PYELOGRAM/URETERAL STENT PLACEMENT;  Surgeon: Sherrilee Belvie CROME, MD;  Location: [facility];  Service: Urology;  Laterality: Bilateral;   ETHMOIDECTOMY Right 02/04/2020   Procedure: ETHMOIDECTOMY;  Surgeon: Karis Clunes, MD;  Location: [facility];  Service: ENT;  Laterality: Right;   INGUINAL LYMPH NODE BIOPSY Right 04/21/2019   Procedure: INGUINAL LYMPH NODE BIOPSY;  Surgeon: Kallie Manuelita BROCKS, MD;  Location: [facility];  Service: General;  Laterality: Right;   MAXILLARY ANTROSTOMY Right 02/04/2020   Procedure: MAXILLARY ANTROSTOMY WITH TISSUE REMOVAL;  Surgeon: Karis Clunes, MD;  Location: [facility];  Service: ENT;  Laterality: Right;   PORTACATH PLACEMENT Left 05/07/2019   Procedure: INSERTION PORT-A-CATH (catheter left subclavian);  Surgeon: Kallie Manuelita BROCKS, MD;  Location: [facility];  Service: General;  Laterality: Left;    SOCIAL HISTORY:  Social History   Socioeconomic History   Marital status: Married    Spouse name: Not on file   Number of children: 3   Years of education: 10   Highest education level: Not on file  Occupational History   Not on file  Tobacco Use   Smoking status: Former    Current packs/day: 0.00    Average packs/day: 3.0 packs/day for 30.0 years (90.0 ttl pk-yrs)    Types: Cigarettes    Start date: 04/26/1969    Quit date: 04/27/1999    Years since quitting: 25.1   Smokeless tobacco: Never  Vaping Use   Vaping status: Never Used  Substance and Sexual Activity   Alcohol use: Yes    Comment: daily. 2-6 beer daily   Drug use: Never   Sexual activity: Yes   Other Topics Concern   Not on file  Social History Narrative   Not on file   Social Drivers of Health   Financial Resource Strain: Low Risk  (10/22/2022)   Overall Financial Resource Strain (CARDIA)    Difficulty of Paying Living Expenses: Not hard at all  Food Insecurity: No Food Insecurity (10/22/2022)   Hunger Vital Sign    Worried About Running Out of Food in the Last Year: Never true    Ran Out of Food in the  Last Year: Never true  Transportation Needs: No Transportation Needs (10/22/2022)   PRAPARE - Administrator, Civil Service (Medical): No    Lack of Transportation (Non-Medical): No  Physical Activity: Sufficiently Active (10/22/2022)   Exercise Vital Sign    Days of Exercise per Week: 5 days    Minutes of Exercise per Session: 30 min  Stress: No Stress Concern Present (10/22/2022)   [employer] of Occupational Health - Occupational Stress Questionnaire    Feeling of Stress : Not at all  Social Connections: Moderately Isolated (10/22/2022)   Social Connection and Isolation Panel    Frequency of Communication with Friends and Family: More than three times a week    Frequency of Social Gatherings with Friends and Family: More than three times a week    Attends Religious Services: Never    Database Administrator or Organizations: No    Attends Banker Meetings: Never    Marital Status: Married  Catering Manager Violence: Not At Risk (10/22/2022)   Humiliation, Afraid, Rape, and Kick questionnaire    Fear of Current or Ex-Partner: No    Emotionally Abused: No    Physically Abused: No    Sexually Abused: No    FAMILY HISTORY:  Family History  Problem Relation Age of Onset   Diabetes Brother    Diabetes Mother    Leukemia Brother    Diabetes Son    Colon cancer Neg Hx     CURRENT MEDICATIONS:  Current Outpatient Medications  Medication Sig Dispense Refill   amLODipine  (NORVASC ) 10 MG tablet Take 1 tablet (10 mg total) by mouth daily.  90 tablet 3   aspirin EC 81 MG tablet Take 81 mg by mouth daily. Swallow whole.     escitalopram  (LEXAPRO ) 10 MG tablet Take 1 tablet (10 mg total) by mouth daily. 90 tablet 3   fenofibrate  (TRICOR ) 145 MG tablet Take 1 tablet (145 mg total) by mouth daily. 90 tablet 3   fluticasone  (FLONASE ) 50 MCG/ACT nasal spray Place 2 sprays into both nostrils daily. 16 g 6   LORazepam  (ATIVAN ) 0.5 MG tablet TAKE 1 TABLET (0.5 MG TOTAL) BY MOUTH EVERY 8 (EIGHT) HOURS. (Patient taking differently: Take 0.5 mg by mouth every 8 (eight) hours as needed for anxiety.) 60 tablet 3   losartan -hydrochlorothiazide  (HYZAAR) 100-25 MG tablet Take 1 tablet by mouth daily. 90 tablet 3   metoprolol  succinate (TOPROL -XL) 25 MG 24 hr tablet Take 1 tablet (25 mg total) by mouth daily. 90 tablet 3   Vitamin D , Ergocalciferol , (DRISDOL ) 1.25 MG (50000 UNIT) CAPS capsule Take 1 capsule (50,000 Units total) by mouth every 7 (seven) days. 5 capsule 6   No current facility-administered medications for this visit.   Facility-Administered Medications Ordered in Other Visits  Medication Dose Route Frequency Provider Last Rate Last Admin   0.9 %  sodium chloride  infusion   Intravenous Continuous Rogers Hai, MD 20 mL/hr at 08/26/19 1105 New Bag at 08/26/19 1105   sodium chloride  flush (NS) 0.9 % injection 10 mL  10 mL Intravenous PRN Rogers Hai, MD   10 mL at 08/26/19 1104   sodium chloride  flush (NS) 0.9 % injection 10 mL  10 mL Intravenous PRN Rogers Hai, MD   10 mL at 06/22/21 1107    ALLERGIES:  No Known Allergies  LABORATORY DATA:  I have reviewed the labs as listed.     Latest Ref Rng & Units 05/24/2024    1:48  PM 11/04/2023   10:18 AM 10/15/2023    3:30 PM  CBC  WBC 4.0 - 10.5 K/uL 7.0  6.7  7.2   Hemoglobin 13.0 - 17.0 g/dL 86.5  86.4  86.6   Hematocrit 39.0 - 52.0 % 39.9  40.7  38.9   Platelets 150 - 400 K/uL 240  236  273       Latest Ref Rng & Units 05/24/2024    1:48 PM 05/06/2024     4:08 PM 11/04/2023   10:18 AM  CMP  Glucose 70 - 99 mg/dL 815  880  891   BUN 8 - 23 mg/dL 19  23  20    Creatinine 0.61 - 1.24 mg/dL 8.92  8.77  9.01   Sodium 135 - 145 mmol/L 141  141  140   Potassium 3.5 - 5.1 mmol/L 3.9  4.1  5.0   Chloride 98 - 111 mmol/L 101  100  100   CO2 22 - 32 mmol/L 29  27  27    Calcium 8.9 - 10.3 mg/dL 9.4  9.9  9.6   Total Protein 6.5 - 8.1 g/dL 6.4   6.3   Total Bilirubin 0.0 - 1.2 mg/dL 0.2   0.3   Alkaline Phos 38 - 126 U/L 47   56   AST 15 - 41 U/L 20   17   ALT 0 - 44 U/L 15   13     DIAGNOSTIC IMAGING:  I have independently reviewed the scans and discussed with the patient. No results found.   WRAP UP:  All questions were answered. The patient knows to call the clinic with any problems, questions or concerns.  Medical decision making: ***  Time spent on visit: I spent {CHL ONC TIME VISIT - DTPQU:[id number]} counseling the patient face to face. The total time spent in the appointment was {CHL ONC TIME VISIT - DTPQU:[id number]} and more than 50% was on counseling.  Pleasant CHRISTELLA Barefoot, PA-C  ***

## 2024-05-31 ENCOUNTER — Inpatient Hospital Stay: Attending: Physician Assistant | Admitting: Physician Assistant

## 2024-05-31 VITALS — BP 135/46 | HR 56 | Temp 99.4°F | Resp 16 | Wt 235.2 lb

## 2024-05-31 DIAGNOSIS — Z806 Family history of leukemia: Secondary | ICD-10-CM | POA: Insufficient documentation

## 2024-05-31 DIAGNOSIS — Z86718 Personal history of other venous thrombosis and embolism: Secondary | ICD-10-CM | POA: Diagnosis not present

## 2024-05-31 DIAGNOSIS — C8338 Diffuse large B-cell lymphoma, lymph nodes of multiple sites: Secondary | ICD-10-CM | POA: Diagnosis not present

## 2024-05-31 DIAGNOSIS — Z808 Family history of malignant neoplasm of other organs or systems: Secondary | ICD-10-CM | POA: Insufficient documentation

## 2024-05-31 DIAGNOSIS — C8231 Follicular lymphoma grade IIIa, lymph nodes of head, face, and neck: Secondary | ICD-10-CM

## 2024-05-31 DIAGNOSIS — Z79899 Other long term (current) drug therapy: Secondary | ICD-10-CM | POA: Insufficient documentation

## 2024-05-31 DIAGNOSIS — Z87891 Personal history of nicotine dependence: Secondary | ICD-10-CM | POA: Diagnosis not present

## 2024-05-31 NOTE — Patient Instructions (Signed)
 [facility] **VISIT SUMMARY & IMPORTANT INSTRUCTIONS **   You were seen today by Pleasant Barefoot PA-C for follow-up of your follicular lymphoma with history of diffuse large B-cell lymphoma.  Your PET scan continues to show waxing and waning lymph nodes consistent with your follicular lymphoma.  Overall though, we do see increased size and activity of several lymph nodes, particularly the lymph node in your right groin.  We will send you for biopsy at [facility] in [place].  You will be seen by Dr. Davonna 1 week after your biopsy.  ** Thank you for trusting me with your healthcare!  I strive to provide all of my patients with quality care at each visit.  If you receive a survey for this visit, I would be so grateful to you for taking the time to provide feedback.  Thank you in advance!  ~ Efton Thomley                                        Dr. Mickiel Davonna Pleasant Barefoot, PA-C          Delon Hope, NP   - - - - - - - - - - - - - - - - - -    Thank you for choosing [facility] to provide your oncology and hematology care.  To afford each patient quality time with our provider, please arrive at least 15 minutes before your scheduled appointment time.   If you have a lab appointment with the Cancer Center please come in thru the Main Entrance and check in at the main information desk.  You need to re-schedule your appointment should you arrive 10 or more minutes late.  We strive to give you quality time with our providers, and arriving late affects you and other patients whose appointments are after yours.  Also, if you no show three or more times for appointments you may be dismissed from the clinic at the providers discretion.     Again, thank you for choosing [facility].  Our hope is that these requests will decrease the amount of time that you wait before being seen by our physicians.        _____________________________________________________________  Should you have questions after your visit to [facility], please contact our office at [phone] and follow the prompts.  Our office hours are 8:00 a.m. and 4:30 p.m. Monday - Friday.  Please note that voicemails left after 4:00 p.m. may not be returned until the following business day.  We are closed weekends and major holidays.  You do have access to a nurse 24-7, just call the main number to the clinic [phone] and do not press any options, hold on the line and a nurse will answer the phone.    For prescription refill requests, have your pharmacy contact our office and allow 72 hours.

## 2024-06-02 NOTE — Progress Notes (Unsigned)
 Karalee Wilkie POUR, MD  Baldwin Channing CROME Approved for US  guided CORE BX of upper thigh lymphadenopathy (right and left are equal, doc of the day can choose which one).  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

## 2024-06-07 ENCOUNTER — Encounter: Payer: Self-pay | Admitting: *Deleted

## 2024-06-28 ENCOUNTER — Encounter: Payer: Self-pay | Admitting: *Deleted

## 2024-07-07 ENCOUNTER — Inpatient Hospital Stay: Attending: Physician Assistant | Admitting: Oncology

## 2024-07-16 ENCOUNTER — Ambulatory Visit (HOSPITAL_COMMUNITY)
Admission: RE | Admit: 2024-07-16 | Discharge: 2024-07-16 | Disposition: A | Discharge status: Home / Self Care | Source: Ambulatory Visit | Attending: Physician Assistant | Admitting: Physician Assistant

## 2024-07-16 DIAGNOSIS — C8290 Follicular lymphoma, unspecified, unspecified site: Secondary | ICD-10-CM | POA: Diagnosis present

## 2024-07-16 DIAGNOSIS — C8338 Diffuse large B-cell lymphoma, lymph nodes of multiple sites: Secondary | ICD-10-CM

## 2024-07-16 DIAGNOSIS — C8335 Diffuse large B-cell lymphoma, lymph nodes of inguinal region and lower limb: Secondary | ICD-10-CM | POA: Diagnosis not present

## 2024-07-16 DIAGNOSIS — C8231 Follicular lymphoma grade IIIa, lymph nodes of head, face, and neck: Secondary | ICD-10-CM

## 2024-07-16 MED ORDER — GELATIN ABSORBABLE 12-7 MM EX MISC: 1.0000 | Freq: Once | CUTANEOUS | Status: DC

## 2024-07-16 MED ORDER — LIDOCAINE HCL (PF) 1 % IJ SOLN
10.0000 mL | Freq: Once | INTRAMUSCULAR | Status: AC
  Administered 2024-07-16: 10 mL via INTRADERMAL

## 2024-07-16 NOTE — Procedures (Signed)
 Vascular and Interventional Radiology Procedure Note  Patient: Kevin [name] DOB: 1955/11/14 Medical Record Number: [id number] Note Date/Time: 07/16/24 1:39 PM   Performing Physician: Thom Hall, MD Assistant(s): None  Diagnosis: Hx DLBCL + Follicular lymphoma   Procedure: RIGHT INGUINAL LYMPH NODE BIOPSY  Anesthesia: Local Anesthetic Complications: None Estimated Blood Loss: Minimal Specimens: Sent for Pathology  Findings:  Successful Ultrasound-guided biopsy of a R inguinal LN. A total of 3 samples were obtained. Hemostasis of the tract was achieved using Manual Pressure.   See detailed procedure note with images in PACS. The patient tolerated the procedure well without incident or complication and was returned to Recovery in stable condition.    Thom Hall, MD [facility]   Pager. [phone] Clinic. [phone]

## 2024-07-22 LAB — SURGICAL PATHOLOGY

## 2024-07-23 ENCOUNTER — Inpatient Hospital Stay: Attending: Physician Assistant | Admitting: Oncology

## 2024-07-23 VITALS — BP 155/67 | HR 65 | Temp 98.4°F | Resp 18 | Ht 66.0 in | Wt 231.2 lb

## 2024-07-23 DIAGNOSIS — C8338 Diffuse large B-cell lymphoma, lymph nodes of multiple sites: Secondary | ICD-10-CM

## 2024-07-23 DIAGNOSIS — C8231 Follicular lymphoma grade IIIa, lymph nodes of head, face, and neck: Secondary | ICD-10-CM | POA: Diagnosis not present

## 2024-07-23 NOTE — Progress Notes (Signed)
 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 male was transferred to my care after his prior physician has left.   ASSESSMENT & PLAN:   Assessment & Plan: Kevin [name]  is a 69 y.o. 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 Asymptomatic with no B symptoms or complications.PET scan with low disease burden  - Maintained observation and surveillance. - Instructed him to report new symptoms such as fever, chills, night sweats, weight loss, anorexia, or recurrent infections. - Scheduled follow-up in three months with laboratory studies. - Discussed initiation of rituximab  for symptomatic or enlarging lymphadenopathy. - Explained rituximab  is well tolerated and standard for symptomatic follicular lymphoma. - Discussed management change if transformation to aggressive lymphoma occurs. 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   - Can consider CAR-T therapy at the time of recurrence.  Provoked bilateral DVT Likely provoked by lymphoma.  Completed 1 year of Eliquis  in 2021.      The patient understands the plans discussed today and is in agreement with them.  He knows to contact our office if he develops concerns prior to his next appointment.  32 minutes of total time was spent for this patient encounter, including preparation,review of records,  face-to-face counseling with the patient and coordination of care, physical exam, and documentation of the encounter.     LILLETTE Verneta SAUNDERS Teague,acting as a neurosurgeon for Mickiel Dry, MD.,have documented all relevant documentation on the behalf of Mickiel Dry, MD,as directed by  Mickiel Dry, MD while in the presence of Mickiel Dry, MD.  I, Mickiel Dry MD, have reviewed the above documentation for accuracy and completeness, and I agree with the above.    Mickiel Dry, MD  [facility] - A DEPT OF [facility] [address] [place] [address] Dept: [phone] Dept Fax: [phone]   No orders of the defined types were placed in this encounter.    ONCOLOGY HISTORY:   I have reviewed his chart and materials related to his cancer extensively and collaborated history with the patient. 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 While this may be related to the acute infectious/inflammatory process, neoplasm is not excluded. There are no calcifications to suggest granulomatous disease. Sarcoid is not excluded. Bilateral axillary adenopathy is also concerning for malignancy. -04/18/2019: CT AP:  Dominant soft tissue mass in the proximal mesentery measuring up to 12 cm. There is also a multifocal adenopathy throughout the abdomen and pelvis. In conjunction with thoracic adenopathy, findings highly suspicious for lymphoproliferative disorder such as lymphoma. Metastatic disease is also considered. Enlarged inguinal nodes (right greater than left) may be an easily accessible option for tissue sampling. -04/21/2019: Right inguinal lymph node biopsy.  Pathology: Diffuse large B-cell lymphoma (40%) arising in a background of follicular lymphoma (60%). Immunohistochemistry revealed  the abnormal lymphocytes are positive for CD20, CD10, BCL6 (variable and diffuse areas), and  BCL-2.  EBV in situ hybridization is negative.  Ki-67 reveals a variable proliferation rate in the nodular areas and increased proliferation rate in the diffuse areas (80%).  CD3 and CD5 highlight admixed and surrounding T cells.  -04/21/2019: Lymphoma FISH Panel: Rearrangements of both MYC and BCL2. BCL6 is not rearranged. These findings are consistent with a high grade B-cell lymphoma (double hit lymphoma) arising from a follicular lymphoma.  -04/26/2019: Beta-2  microglobulin 3.2. Uric acid normal.  -05/03/2019: Initial PET: Bulky intensely hypermetabolic peritoneal and retroperitoneal nodal mass and nodularity consistent with high-grade lymphoma. Additional hypermetabolic adenopathy involving the axilla, mediastinum and iliac and inguinal lymph nodes. Multiple foci of discrete hypermetabolic metastasis within the skeleton without CT correlation. Several lesions within the liver parenchyma. -05/11/2019 - 08/23/2019: 6 cycles of R-EPOCH -09/17/2019: PET: Continued decrease in FDG uptake associated with bolt E mesenteric mass and abdominal and pelvic lymph nodes. Deauville 2. Continued improvement with multifocal bone lesions, Deauville 2/Deauville 3. Lesion along the right mandible is favored to be a result of periodontal disease given the periapical lucency adjacent to the area of uptake, attention on follow-up. -He was evaluated by lymphoma specialist Dr. Vaidya at [facility].  His case was discussed at tumor board.  No adjuvant radiation therapy or maintenance rituximab  or transplant was recommended at this time. -12/14/2019: PET: Small bowel mesenteric soft tissue mass/nodularity with mild hypermetabolism (Deauville 3). Additional small lymph nodes in the chest, abdomen and pelvis with metabolism at or below mediastinum (Deauville 2). Enlarging rounded soft tissue in the right maxillary sinus with periosteal thickening and mild hypermetabolism. -02/04/2020: Right sinus mass excision.    Pathology: No malignancy identified.  -03/2020 - 12/2021: Imaging showing stable disease -01/03/2022: PET: Mixed appearance but primarily worsened findings of lymphoma, with newly hypermetabolic bilateral right inguinal lymph nodes (Deauville 4) and a newly mildly hypermetabolic right pleural lymph node adjacent to the upper T8 vertebral body (Deauville 3). The hazy central mesenteric stranding compatible with treated lymphoma is still Deauville 3 but has a slightly improved SUV at 3.2 (formerly 3.9). The previously seen lymph node along the left upper lateral margin of the subcutaneous course of the penis has a maximum SUV of 5.3 (Deauville 4), formerly 4.7. -07/29/2022: PET scan: Overall, no clear trend towards improving or worsening disease. While central mesenteric disease is similar to prior, index lymph nodes in the pelvis measure slightly larger in the interval although generally show slight interval decrease in hypermetabolism. The index node in the right groin region does show interval slight increase in hypermetabolism.  -08/27/2022: Right inguinal lymph node biopsy.  Pathology: benign lymph nodal tissue.  -Cytometry: Predominance of T cells with nonspecific changes. No monoclonal B-cell population identified.  -03/26/2023: PET:  Progressive hypermetabolic adenopathy in the right occipital scalp, pelvis and inguinal regions consistent with progressive lymphoma (Deauville 5). No progressive disease in the chest or abdomen. No visceral organ or osseous involvement identified. Stable low level hypermetabolic activity within the treated disease in the central mesenteric fat. -10/06/2023: PET scan: Overall slightly increased in size of several nodal tissue, predominantly in the right occipital subcutaneous nodule.  -11/18/2023: Lymph node biopsy. Right occipital nodule Pathology: Follicular lymphoma. Morphology reveals lymph node with effaced architecture comprised of small to medium sized cells  with occasional large cells.  Immunohistochemically, the follicles are positive for CD20, CD10, BCL2 and PAX5. CD3 and CD5 highlight background T lymphocytes.  Ki-67  is low overall, with focal accentuation in some areas. CD21 highlights follicular dendritic meshwork. BCL6 highlights residual germinal centers. Cyclin D1 is negative. Flow cytometric analysis reveals a clonal B-cell population comprising 33% of lymphocytes.  In summary the findings are consistent with involvement by the patient's known follicular lymphoma, low-grade with focal areas concerning for higher grade (3 A) on needle core biopsy.  -05/24/2024: PET:  Interval progression of disease with increased size and/or metabolic activity of multiple lesions, including the left facial subcutaneous lesion, posterior mediastinal nodule, lower abdominal mesenteric lymph node, and ventral abdominal wall nodule. Deauville criteria 4/5. Stable or slightly decreased size and/or metabolic activity of other lesions, including the right occipital index lymph node, left paratracheal lymph node, central mesenteric matted soft tissue/lymph nodes, and right inguinal lymph node. -07/16/2024: Right inguinal lymph node biopsy.  Pathology: Follicular lymphoma. The lymphocytes are predominantly small, cleaved type lymphocytes that stain with the B-cell marker CD20 and the germinal center markers BCL6 and CD10.  In addition, there is aberrant Bcl-2 positivity.  The B cells are positive for CD23 and is difficult to accurately assess the follicular dendritic cell meshwork by CD23; however, CD21 is essentially lost. The findings are consistent with a predominantly diffuse pattern.  Greater than 90% of the cores consist of the aforementioned small cleaved type lymphocytes.  Scattered centroblasts are present in these areas focally up to 10 high-power field but overall <5 HPF consistent with predominantly a low-grade follicular lymphoma (predominantly grade 1 with limited grade  2); however, very focally, approximately 5% shows an area of increased centroblasts greater than 15/HPF but not in sheets consistent with very focal grade 3a follicular lymphoma. Concurrent flow cytometry identifies kappa restricted CD10 positive B cells (64% of lymphocytes) supporting a diagnosis of a follicular/germinal center immunophenotype.   Current Treatment:  Surveillance  INTERVAL HISTORY:   Discussed the use of AI scribe software for clinical note transcription with the patient, who gave verbal consent to proceed.  History of Present Illness Kevin [name] is a 69 year old male with follicular lymphoma and prior history of DLBCL and venous thrombosis who presents for follow-up after recent lymph node biopsy confirmed follicular lymphoma.  He presents for surveillance following a recent biopsy of the most metabolically active lymph node on PET scan, which demonstrated follicular lymphoma consistent with a prior biopsy nearly two years ago. He has not required active treatment since his previous rituximab -containing chemotherapy for diffuse large B-cell lymphoma. He denies fever, chills, night sweats, weight loss, appetite loss, frequent infections, or pain. He describes feeling a little bit low but otherwise has no complaints. Laboratory results from November were reviewed.       I have reviewed the past medical history, past surgical history, social history and family history with the patient and they are unchanged from previous note.  ALLERGIES:  has no known allergies.  MEDICATIONS:  Current Outpatient Medications  Medication Sig Dispense Refill   amLODipine  (NORVASC ) 10 MG tablet Take 1 tablet (10 mg total) by mouth daily. 90 tablet 3   aspirin EC 81 MG tablet Take 81 mg by mouth daily. Swallow whole.     escitalopram  (LEXAPRO ) 10 MG tablet Take 1 tablet (10 mg total) by mouth daily. 90 tablet 3   fenofibrate  (TRICOR ) 145 MG tablet Take 1 tablet (145 mg total) by  mouth daily. 90 tablet 3   fluticasone  (FLONASE ) 50 MCG/ACT nasal spray Place 2 sprays into both nostrils daily. 16 g 6   LORazepam  (ATIVAN )  0.5 MG tablet TAKE 1 TABLET (0.5 MG TOTAL) BY MOUTH EVERY 8 (EIGHT) HOURS. (Patient taking differently: Take 0.5 mg by mouth every 8 (eight) hours as needed for anxiety.) 60 tablet 3   losartan -hydrochlorothiazide  (HYZAAR) 100-25 MG tablet Take 1 tablet by mouth daily. 90 tablet 3   metoprolol  succinate (TOPROL -XL) 25 MG 24 hr tablet Take 1 tablet (25 mg total) by mouth daily. 90 tablet 3   Vitamin D , Ergocalciferol , (DRISDOL ) 1.25 MG (50000 UNIT) CAPS capsule Take 1 capsule (50,000 Units total) by mouth every 7 (seven) days. 5 capsule 6   No current facility-administered medications for this visit.   Facility-Administered Medications Ordered in Other Visits  Medication Dose Route Frequency Provider Last Rate Last Admin   0.9 %  sodium chloride  infusion   Intravenous Continuous Katragadda, Sreedhar, MD 20 mL/hr at 08/26/19 1105 New Bag at 08/26/19 1105   sodium chloride  flush (NS) 0.9 % injection 10 mL  10 mL Intravenous PRN Rogers Hai, MD   10 mL at 08/26/19 1104   sodium chloride  flush (NS) 0.9 % injection 10 mL  10 mL Intravenous PRN Rogers Hai, MD   10 mL at 06/22/21 1107    VITALS:  Blood pressure (!) 155/67, pulse 65, temperature 98.4 F (36.9 C), temperature source Tympanic, resp. rate 18, height 5' 6 (1.676 m), weight 231 lb 3.2 oz (104.9 kg), SpO2 95%.  Wt Readings from Last 3 Encounters:  07/23/24 231 lb 3.2 oz (104.9 kg)  05/31/24 235 lb 3.7 oz (106.7 kg)  05/06/24 230 lb (104.3 kg)    Body mass index is 37.32 kg/m.  Performance status (ECOG): 0 - Asymptomatic  PHYSICAL EXAM:   GENERAL:alert, no distress and comfortable NECK: supple, thyroid  normal size, non-tender, without nodularity LYMPH:  no palpable lymphadenopathy in the cervical, axillary or inguinal LUNGS: clear to auscultation and percussion with normal  breathing effort HEART: regular rate & rhythm and no murmurs and no lower extremity edema ABDOMEN:abdomen soft, non-tender and normal bowel sounds Musculoskeletal:no cyanosis of digits and no clubbing  NEURO: alert & oriented x 3 with fluent speech  LABORATORY DATA:  I have reviewed the data as listed   Lab Results  Component Value Date   WBC 7.0 05/24/2024   NEUTROABS 5.3 05/24/2024   HGB 13.4 05/24/2024   HCT 39.9 05/24/2024   MCV 92.8 05/24/2024   PLT 240 05/24/2024     Chemistry      Component Value Date/Time   NA 141 05/24/2024 1348   NA 141 05/06/2024 1608   K 3.9 05/24/2024 1348   CL 101 05/24/2024 1348   CO2 29 05/24/2024 1348   BUN 19 05/24/2024 1348   BUN 23 05/06/2024 1608   CREATININE 1.07 05/24/2024 1348      Component Value Date/Time   CALCIUM 9.4 05/24/2024 1348   ALKPHOS 47 05/24/2024 1348   AST 20 05/24/2024 1348   ALT 15 05/24/2024 1348   BILITOT 0.2 05/24/2024 1348   BILITOT 0.3 11/04/2023 1018       Latest Reference Range & Units 05/24/24 13:48  LDH 105 - 235 U/L 125   RADIOGRAPHIC STUDIES: I have personally reviewed the radiological images as listed and agreed with the findings in the report. "

## 2024-07-24 ENCOUNTER — Encounter (HOSPITAL_COMMUNITY): Payer: Self-pay | Admitting: Oncology

## 2024-10-20 ENCOUNTER — Inpatient Hospital Stay

## 2024-10-27 ENCOUNTER — Inpatient Hospital Stay: Admitting: Physician Assistant

## 2024-11-05 ENCOUNTER — Encounter: Admitting: Family Medicine
